# Patient Record
Sex: Male | Born: 1940
Health system: Southern US, Community
[De-identification: ages and names within clinical notes are randomized; demographics above are authoritative.]

## PROBLEM LIST (undated history)

## (undated) DIAGNOSIS — I10 Essential (primary) hypertension: Secondary | ICD-10-CM

## (undated) DIAGNOSIS — E7800 Pure hypercholesterolemia, unspecified: Secondary | ICD-10-CM

## (undated) DIAGNOSIS — I2510 Atherosclerotic heart disease of native coronary artery without angina pectoris: Secondary | ICD-10-CM

## (undated) DIAGNOSIS — M7020 Olecranon bursitis, unspecified elbow: Secondary | ICD-10-CM

## (undated) DIAGNOSIS — I219 Acute myocardial infarction, unspecified: Secondary | ICD-10-CM

## (undated) DIAGNOSIS — Z8774 Personal history of (corrected) congenital malformations of heart and circulatory system: Secondary | ICD-10-CM

## (undated) HISTORY — DX: Pure hypercholesterolemia, unspecified: E78.00

## (undated) HISTORY — DX: Atherosclerotic heart disease of native coronary artery without angina pectoris: I25.10

## (undated) HISTORY — DX: Acute myocardial infarction, unspecified: I21.9

## (undated) HISTORY — DX: Olecranon bursitis, unspecified elbow: M70.20

---

## 1998-11-16 ENCOUNTER — Inpatient Hospital Stay (HOSPITAL_COMMUNITY): Admission: EM | Admit: 1998-11-16 | Discharge: 1998-11-20 | Payer: Self-pay | Admitting: Cardiovascular Disease

## 1999-08-15 ENCOUNTER — Inpatient Hospital Stay (HOSPITAL_COMMUNITY): Admission: EM | Admit: 1999-08-15 | Discharge: 1999-08-18 | Payer: Self-pay | Admitting: Cardiology

## 1999-08-15 HISTORY — PX: CORONARY ANGIOPLASTY WITH STENT PLACEMENT: SHX49

## 1999-12-15 ENCOUNTER — Ambulatory Visit (HOSPITAL_COMMUNITY): Admission: RE | Admit: 1999-12-15 | Discharge: 1999-12-15 | Payer: Self-pay | Admitting: Cardiovascular Disease

## 1999-12-15 HISTORY — PX: CARDIAC CATHETERIZATION: SHX172

## 2001-02-05 ENCOUNTER — Other Ambulatory Visit: Admission: RE | Admit: 2001-02-05 | Discharge: 2001-02-05 | Payer: Self-pay | Admitting: General Surgery

## 2005-01-13 ENCOUNTER — Ambulatory Visit: Payer: Self-pay

## 2005-01-13 ENCOUNTER — Ambulatory Visit: Payer: Self-pay | Admitting: Cardiovascular Disease

## 2006-03-07 ENCOUNTER — Ambulatory Visit: Payer: Self-pay | Admitting: Cardiovascular Disease

## 2006-10-10 ENCOUNTER — Ambulatory Visit: Payer: Self-pay | Admitting: Cardiovascular Disease

## 2006-11-22 ENCOUNTER — Other Ambulatory Visit: Admission: RE | Admit: 2006-11-22 | Discharge: 2006-11-22 | Payer: Self-pay | Admitting: General Surgery

## 2007-11-06 ENCOUNTER — Ambulatory Visit: Payer: Self-pay | Admitting: Cardiovascular Disease

## 2009-02-02 ENCOUNTER — Telehealth: Payer: Self-pay | Admitting: Cardiovascular Disease

## 2009-11-04 DIAGNOSIS — E7800 Pure hypercholesterolemia, unspecified: Secondary | ICD-10-CM | POA: Insufficient documentation

## 2009-11-04 DIAGNOSIS — I2510 Atherosclerotic heart disease of native coronary artery without angina pectoris: Secondary | ICD-10-CM | POA: Insufficient documentation

## 2009-11-08 ENCOUNTER — Ambulatory Visit: Payer: Self-pay | Admitting: Cardiovascular Disease

## 2009-11-10 ENCOUNTER — Ambulatory Visit: Payer: Self-pay | Admitting: Cardiovascular Disease

## 2009-11-11 ENCOUNTER — Encounter (INDEPENDENT_AMBULATORY_CARE_PROVIDER_SITE_OTHER): Payer: Self-pay | Admitting: *Deleted

## 2009-11-11 LAB — CONVERTED CEMR LAB
Cholesterol: 134 mg/dL (ref 0–200)
HDL: 34.2 mg/dL — ABNORMAL LOW (ref >39.00)
LDL Cholesterol: 75 mg/dL (ref 0–99)
Total CHOL/HDL Ratio: 4
Triglycerides: 125 mg/dL (ref 0.0–149.0)
VLDL: 25 mg/dL (ref 0.0–40.0)

## 2010-02-24 ENCOUNTER — Encounter (INDEPENDENT_AMBULATORY_CARE_PROVIDER_SITE_OTHER): Payer: Self-pay | Admitting: *Deleted

## 2010-06-07 ENCOUNTER — Ambulatory Visit: Payer: Self-pay | Admitting: Cardiovascular Disease

## 2010-11-08 NOTE — Assessment & Plan Note (Signed)
Summary: F1Y  Medications Added METOPROLOL TARTRATE 50 MG TABS (METOPROLOL TARTRATE) Take one tablet by mouth twice a day ASPIRIN EC 325 MG TBEC (ASPIRIN) Take one tablet by mouth daily      Allergies Added: NKDA  CC:  yealry visit.  History of Present Illness: Larry [name] is seen today for F/U of old IMI with CAD and elevated lipids.  He had multiple interventions to the RCA in 2000 and 2001.  His last myovue was in 2006. He is very active and feels that so long as he doesn't have angina he doesn't need a stress test.  Since he had classic angina with his MI I told him I agree.  He has nitro renewed every year and takes ASA.  He needs LFT's and lipids this week.  He has recently moved closer to [place].  He is seeing Delphina Cahill as his primary now.  Current Problems (verified): 1)  Hypercholesterolemia  (ICD-272.0) 2)  Cad  (ICD-414.00)  Current Medications (verified): 1)  Pravastatin Sodium 40 Mg Tabs (Pravastatin Sodium) .... Take One Tablet By Mouth Daily At Bedtime 2)  Metoprolol Tartrate 50 Mg Tabs (Metoprolol Tartrate) .... Take One Tablet By Mouth Twice A Day 3)  Aspirin Ec 325 Mg Tbec (Aspirin) .... Take One Tablet By Mouth Daily  Allergies (verified): No Known Drug Allergies  Past History:  Past Medical History: Last updated: 11/04/2009 CAD:  IMI in 2000 with stent to RCA in 11/1998 and 08/1999.  Stents patent on cath 2001.  Inferior infarct on myovue in 2006 with no ishcemia and normal EF Hypercholesterolemia: Olecranon bursitis  Family History: Last updated: 11/04/2009 noncontributory  Social History: Last updated: 11/08/2009 Married Non-smoker Non-drinker Active does a lot of projects around the house.  Social History: Married Non-smoker Non-drinker Active does a lot of projects around the house.  Review of Systems       Denies fever, malais, weight loss, blurry vision, decreased visual acuity, cough, sputum, SOB, hemoptysis, pleuritic pain, palpitaitons,  heartburn, abdominal pain, melena, lower extremity edema, claudication, or rash. Did have one episode of positional vertigo while grocery shopping and spinning quickly All other systems reviewed and negative  Vital Signs:  Patient profile:   70 year old male Height:      72 inches Weight:      202 pounds BMI:     27.50 Pulse rate:   64 / minute Resp:     12 per minute BP sitting:   142 / 70  (left arm)  Vitals Entered By: Burnett Kanaris (November 08, 2009 2:17 PM)  Physical Exam  General:  Affect appropriate Healthy:  appears stated age 21: normal Neck supple with no adenopathy JVP normal no bruits no thyromegaly Lungs clear with no wheezing and good diaphragmatic motion Heart:  S1/S2 no murmur,rub, gallop or click PMI normal Abdomen: benighn, BS positve, no tenderness, no AAA no bruit.  No HSM or HJR Distal pulses intact with no bruits No edema Neuro non-focal Skin warm and dry    Impression & Recommendations:  Problem # 1:  HYPERCHOLESTEROLEMIA (ICD-272.0) labs this week.  Change to Crestor or Vytorin if LDL greater than 100 His updated medication list for this problem includes:    Pravastatin Sodium 40 Mg Tabs (Pravastatin sodium) .Marland Kitchen... Take one tablet by mouth daily at bedtime  Problem # 2:  CAD (ICD-414.00) Stable no angina.  Continue ASA and RF modificaiton His updated medication list for this problem includes:    Metoprolol Tartrate 50 Mg  Tabs (Metoprolol tartrate) .Marland Kitchen... Take one tablet by mouth twice a day    Aspirin Ec 325 Mg Tbec (Aspirin) .Marland Kitchen... Take one tablet by mouth daily  Patient Instructions: 1)  Your physician recommends that you schedule a follow-up appointment in: 6 MONTHS 2)  Your physician recommends that you return for a FASTING lipid profile: THIS WEEK   EKG Report  Procedure date:  11/08/2009  Findings:      NSR 65 LAE Old IMI Poor R wave progression.

## 2010-11-08 NOTE — Assessment & Plan Note (Signed)
Summary: f56m/per pt call/lg  Medications Added NITROSTAT 0.4 MG SUBL (NITROGLYCERIN) 1 tablet under tongue at onset of chest pain; you may repeat every 5 minutes for up to 3 doses.      Allergies Added: NKDA  CC:  check up.  History of Present Illness: Larry [name] is seen today for F/U of old IMI with CAD and elevated lipids.  He had multiple interventions to the RCA in 2000 and 2001.  His last myovue was in 2006. He is very active and feels that so long as he doesn't have angina he doesn't need a stress test.  Since he had classic angina with his MI I told him I agree. He needs a refill on nitro.  Last LDL in 2/11 was 75.  He has some stiffness in his left knee.  We gave him Dr Angelica Ran and Honor Loh name for referral  He would need a myovue to clear him for surgery. Notes easy bruising on adult ASA    Current Problems (verified): 1)  Hypercholesterolemia  (ICD-272.0) 2)  Cad  (ICD-414.00)  Current Medications (verified): 1)  Pravastatin Sodium 40 Mg Tabs (Pravastatin Sodium) .... Take One Tablet By Mouth Daily At Bedtime 2)  Metoprolol Tartrate 50 Mg Tabs (Metoprolol Tartrate) .... Take One Tablet By Mouth Twice A Day 3)  Aspirin Ec 325 Mg Tbec (Aspirin) .... Take One Tablet By Mouth Daily 4)  Nitrostat 0.4 Mg Subl (Nitroglycerin) .Marland Kitchen.. 1 Tablet Under Tongue At Onset of Chest Pain; You May Repeat Every 5 Minutes For Up To 3 Doses.  Allergies (verified): No Known Drug Allergies  Past History:  Past Medical History: Last updated: 11/04/2009 CAD:  IMI in 2000 with stent to RCA in 11/1998 and 08/1999.  Stents patent on cath 2001.  Inferior infarct on myovue in 2006 with no ishcemia and normal EF Hypercholesterolemia: Olecranon bursitis  Family History: Last updated: 11/04/2009 noncontributory  Social History: Last updated: 11/08/2009 Married Non-smoker Non-drinker Active does a lot of projects around the house.  Review of Systems       Denies fever, malais, weight loss, blurry  vision, decreased visual acuity, cough, sputum, SOB, hemoptysis, pleuritic pain, palpitaitons, heartburn, abdominal pain, melena, lower extremity edema, claudication, or rash.   Vital Signs:  Patient profile:   70 year old male Height:      72 inches Weight:      206 pounds BMI:     28.04 Pulse rate:   64 / minute Resp:     12 per minute BP sitting:   137 / 72  (left arm)  Vitals Entered By: Burnett Kanaris (June 07, 2010 2:51 PM)  Physical Exam  General:  Affect appropriate Healthy:  appears stated age 75: normal Neck supple with no adenopathy JVP normal no bruits no thyromegaly Lungs clear with no wheezing and good diaphragmatic motion Heart:  S1/S2 no murmur,rub, gallop or click PMI normal Abdomen: benighn, BS positve, no tenderness, no AAA no bruit.  No HSM or HJR Distal pulses intact with no bruits No edema Neuro non-focal Skin warm and dry    Impression & Recommendations:  Problem # 1:  HYPERCHOLESTEROLEMIA (ICD-272.0) At goal continue statin.  F/U labs Delphina Cahill His updated medication list for this problem includes:    Pravastatin Sodium 40 Mg Tabs (Pravastatin sodium) .Marland Kitchen... Take one tablet by mouth daily at bedtime  Problem # 2:  CAD (ICD-414.00) Distant stents to RCA.  No angina.  Can take 81mg  ASA.  Refill nitro called in .  Myovue if needs to be cleared for knee surgery His updated medication list for this problem includes:    Metoprolol Tartrate 50 Mg Tabs (Metoprolol tartrate) .Marland Kitchen... Take one tablet by mouth twice a day    Aspirin Ec 325 Mg Tbec (Aspirin) .Marland Kitchen... Take one tablet by mouth daily    Nitrostat 0.4 Mg Subl (Nitroglycerin) .Marland Kitchen... 1 tablet under tongue at onset of chest pain; you may repeat every 5 minutes for up to 3 doses.  Patient Instructions: 1)  Your physician recommends that you schedule a follow-up appointment in: ONE YEAR 2)  DR Stephanie Coup 3)  DR Adriana Mccallum Prescriptions: NITROSTAT 0.4 MG SUBL (NITROGLYCERIN) 1 tablet under  tongue at onset of chest pain; you may repeat every 5 minutes for up to 3 doses.  #25 x 12   Entered by:   Fredia Beets, RN   Authorized by:   Bosie Clos, MD, [facility]   Signed by:   Fredia Beets, RN on 06/07/2010   Method used:   Electronically to        [facility] (retail)       [address]       [place], [place]  [address]       Ph: [phone] or [phone]       Fax: [phone]   RxID:   [id number]

## 2010-11-08 NOTE — Letter (Signed)
Summary: Appointment - Reminder [place], [place]  [address] [place]   [place], [place] [address]   Phone: [phone]  Fax: [phone]     Feb 24, 2010 MRN: [id number]   Larry [name] [address] [place], [place]  [address]   Dear [name],  Our records indicate that it is time to schedule a follow-up appointment with Dr. Johnsie Cancel. It is very important that we reach you to schedule this appointment. We look forward to participating in your health care needs. Please contact us at the number listed above at your earliest convenience to schedule your appointment.  If you are unable to make an appointment at this time, give [place] a call so we can update our records.     Sincerely,   Darnell Level [facility] Scheduling Team

## 2010-11-08 NOTE — Letter (Signed)
Summary: [place], [place] [address] [place]   [place], [place] [address]   Phone: [phone]  Fax: [phone]     November 11, 2009 MRN: [id number]   Larry [name] [address] [place], [place]  [address]   Dear [name],  We have reviewed your cholesterol results.  They are as follows:     Total Cholesterol:    134 (Desirable: less than 200)       HDL  Cholesterol:     34.20  (Desirable: greater than 40 for men and 50 for women)       LDL Cholesterol:       75  (Desirable: less than 100 for low risk and less than 70 for moderate to high risk)       Triglycerides:       125.0  (Desirable: less than 150)  Our recommendations include: These numbers look great! Continue on the same medicine. Take care, Dr Margot Ables   Call our office at the number listed above if you have any questions.  Lowering your LDL cholesterol is important, but it is only one of a large number of "risk factors" that may indicate that you are at risk for heart disease, stroke or other complications of hardening of the arteries.  Other risk factors include:   A.  Cigarette Smoking* B.  High Blood Pressure* C.  Obesity* D.   Low HDL Cholesterol (see yours above)* E.   Diabetes Mellitus (higher risk if your is uncontrolled) F.  Family history of premature heart disease G.  Previous history of stroke or cardiovascular disease    *These are risk factors YOU HAVE CONTROL OVER.  For more information, visit .  There is now evidence that lowering the TOTAL CHOLESTEROL AND LDL CHOLESTEROL can reduce the risk of heart disease.  The American Heart Association recommends the following guidelines for the treatment of elevated cholesterol:  1.  If there is now current heart disease and less than two risk factors, TOTAL CHOLESTEROL should be less than 200 and LDL CHOLESTEROL should be less than 100. 2.  If there is current heart disease or two or more risk factors, TOTAL  CHOLESTEROL should be less than 200 and LDL CHOLESTEROL should be less than 70.  A diet low in cholesterol, saturated fat, and calories is the cornerstone of treatment for elevated cholesterol.  Cessation of smoking and exercise are also important in the management of elevated cholesterol and preventing vascular disease.  Studies have shown that 30 to 60 minutes of physical activity most days can help lower blood pressure, lower cholesterol, and keep your weight at a healthy level.  Drug therapy is used when cholesterol levels do not respond to therapeutic lifestyle changes (smoking cessation, diet, and exercise) and remains unacceptably high.  If medication is started, it is important to have you levels checked periodically to evaluate the need for further treatment options.  Thank you,    [facility] Team

## 2011-02-21 NOTE — Assessment & Plan Note (Signed)
[facility] OFFICE NOTE   LAN, HODGSON                        MRN:          [id number]  DATE:11/06/2007                            DOB:          1940/12/10    Larry [name] returns in followup.  He has known coronary artery disease  with a previous inferior wall MI.  As I recall he initially had an MI  back in around 2000 with TPA.  Subsequently he had 2 stents to the right  and had reocclusion with repeat intervention.   He has been quite stable over the last few years.   His last Myoview is in April 2006, was low risk with an inferior basal  wall scar and an EF of 54%.  He is not having angina.  He is active.  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  He hopes to retire and start drawing Medicare in March.   He is also very active with his granddaughter up in [place] area.  He  saw Dr. Caron [name]  recently and had lipids which were acceptable, with an  LDL around 80.   He needs a refill on his nitroglycerin. He has been compliant with his  other medications.  He is not having chest pain, PND, orthopnea.   REVIEW OF SYSTEMS:  Otherwise remarkable for recent injury driving a  staple through the index finger on his right hand.  He did get a tetanus  shot for this.  He also has some fluid in the left olecranon bursa.   His medications include aspirin, Toprol 50 a day, Pravachol 40 a day.  He uses [facility]   His exam is remarkable for healthy-appearing elderly white male in no  distress.  Weight is 198, blood pressure is 130/80, pulse 72 and  regular, afebrile.  HEENT:  Unremarkable.  NECK:  Carotids are without bruit, no lymphadenopathy, no thyromegaly or  JVP elevation.  LUNGS:  Clear, good  diaphragmatic motion.  No wheezing.  HEART:  S1-S2 with normal heart sounds.  PMI normal.  ABDOMEN:  Benign.  Bowel sounds positive, no AAA, no tenderness,  no  hepatosplenomegaly.  EXTREMITIES:  Good reflexes, pulse are intact, no edema.  He has a bit  of fluid in the olecranon bursa over the left elbow.  It is not painful.  There is no evidence of gouty  infiltration.   His EKG shows sinus rhythm with an inferior wall infarct. It was misread  by the computer as a flutter due to artifact.   IMPRESSION:  1. Coronary disease, previous IMI.  Continue aspirin and beta blocker,      follow-up Myoview in a year.  Nitroglycerin called in via [facility].  2. Hypercholesterolemia.  Continue Pravachol 40 mg a day, lipid and      liver profile acceptable at Dr. Hulen [name]  office.  Follow-up in a      year.  3. Olecranon bursitis.  Consider p.r.n. nonsteroidals.  I would not  drain it at this time.  I suspect the fluid will subside with in a      week or two.  Overall Larry [name] was doing well and I will see him back      in the year.     Larry [name]. Larry Cancel, MD, [facility]  Electronically Signed    PCN/MedQ  DD: 11/06/2007  DT: 11/06/2007  Job #: [id number]

## 2011-02-24 NOTE — Cardiovascular Report (Signed)
[place]  Patient:    Larry [name], Larry [name]                      MRN: [id number] Proc. Date: 12/15/99 Adm. Date:  [id number] Disc. Date: [id number] Attending:  Fatima Sanger CC:         Dr. Debara Pickett. Wall, M.D. Leon. Johnsie Cancel, M.D. [facility]             Bruce R. Olevia Perches, M.D. [facility]             Cardiopulmonary Lab                        Cardiac Catheterization  CLINICAL HISTORY:  [name] is 70 years old and had a previous stent placed in the right coronary artery by Dr. Lia Foyer in February 2000, and in November 2000 he presented with an acute diaphragmatic wall infarction.  At that time he underwent PTCA and placement of a second overlying stent in the distal right coronary artery. He had done well since that time with no recurrent symptoms, but he had a recent Cardiolite scan which suggested inferior ischemia.  He was seen by Dr. Johnsie Cancel and scheduled for evaluation with catheterization.  PROCEDURAL NOTE:  The procedure was performed by the right femoral artery using  arterial sheath and 6-French preformed coronary catheters.  A front wall arterial puncture was performed and Omnipaque contrast was used.  At the completion of the procedure the right femoral artery was closed with Perclose.  The patient tolerated the procedure well and left the laboratory in satisfactory condition.  HEMODYNAMIC RESULTS: 1. Aortic pressure:  143/89 with mean of 109. 2. Left ventricular pressure:   143/25.  ANGIOGRAPHIC RESULTS: 1. LEFT MAIN CORONARY ARTERY:  Free of significant disease. 2. LEFT ANTERIOR DESCENDING CORONARY:   Gave rise to a diagonal branch, a large    septal perforator, a small diagonal branch, and a small septal perforator.    There was some irregularity in the LAD, but no significant obstruction. 3. CIRCUMFLEX CORONARY ARTERY:  Gave rise to a large marginal branch and a    posterolateral branch.   There was 30% narrowing in the marginal branch, and    40% narrowing in the proximal circumflex artery. 4. RIGHT CORONARY ARTERY:  A moderately large vessel.  It gave rise to two right    ventricular branches, a posterior descending branch and three posterolateral  branches.  The site of the two tandem overlying stents was widely patent, with    only 20% focal narrowing at the point where the stents overlapped.  There was    no significant distal disease.  LEFT VENTRICULOGRAPHY:  Performed in the RAO projection showed overall good wall motion.  There was mild hypokinesis of the inferobasilar segment.  The estimated ejection fraction was 55%.  CONCLUSIONS:  Coronary artery disease, status post prior stenting of the right coronary artery February 2000; and status post prior DMI treated with second overlying stents of the distal right coronary artery November 2000.  No evidence at that time of restenosis (20%) at the stent site, 40% narrowing in the proximal circumflex artery with 30% narrowing in the marginal branch, and mild irregularities in the LAD with mild inferobasilar wall hypokinesis.  RECOMMENDATIONS:  The  patient has not developed restenosis at the stent site. e left ventricular function is good.  Will plan reassurance and continued secondary prevention. DD:  12/15/99 TD:  12/16/99 Job: [id number]

## 2011-02-24 NOTE — Cardiovascular Report (Signed)
. [facility]  Patient:    Larry [name]                       MRN: [id number] Proc. Date: 08/15/99 Adm. Date:  [id number] Attending:  Fatima Sanger CC:         Reece Packer, M.D., [place], [place] Wall, M.D. [place]. Johnsie Cancel, M.D. [facility]                        Cardiac Catheterization  PROCEDURES PERFORMED:  Cardiac Catheterization, IVUS, and stent implantation.  CLINICAL HISTORY:  Larry [name] is 70 years old and had a stent placed by Dr. Lia Foyer in February of this year in the mid to distal right coronary artery.  He had the onset of chest pain at 7:30 this morning and presented to [facility] where his KG showed an acute diaphragmatic wall infarction.  He was seen by Dr. Verl Blalock and transferred here via [facility] for intervention.  DESCRIPTION OF PROCEDURE:  The patient was in atrial fibrillation on arrival with a ventricular of about 120.  The patient was performed via the right femoral artery using arterial sheath and 6-French diagnostic catheters.  After it was recognized that there was total occlusion of the distal right coronary artery at the stent site, preparations were made for intervention.  The patient had been given 5000 units of heparin, which  prolonged the ACT to greater than 200 seconds.  He was started on an Aggrastat rip after a bolus.  We used a JR-4 7-French guiding catheter with sideholes and a short floppy wire.  We crossed the total obstruction within the stent with the wire without difficulty.  We went in with a 3.0 CrossSail balloon and performed two inflations of 8 atm x 30 seconds.  This resulted in reperfusion.  There were filling defects distal to the stent and we elected to perform IVUS.  This documented that the distal reference vessel was about 4.5, but the lumen was about 3.0 and there was severe narrowing of the lumen just distal to the stent. Also, the stent  appeared to be slightly underexpanded in its mid portion with dimensions of 2.7.  It had been treated with a 3.75 balloon at the time of the previous intervention.  For these reasons, we made the decision to go in with a 3.5 x 20 mm Quantum Ranger and we performed two to three inflations up to 12 atm to 14 atm or 30 seconds.  We then deployed a 3.0 x 16 mm NIR On Sox, which ended just before the posterior descending branch and barely overlapped the first stent.  We deployed  this with two inflations of 12 atm and 14 atm x 30 seconds.  We then went back n with the 3.5 Quantum Ranger and performed two inflations up to 15 atm x 30 seconds. We then went in with a 13 mm x 3.75 Maxxum and performed one inflation up to 12 atm for 30 seconds.  The proximal reference lumen was 4.0 and just outside the stent the dimensions were approximately 3.25.  This was prior to our intervention with the 3.75 balloon.  Repeat diagnostic studies were then performed through the guiding catheter.  The patient tolerated the procedure well and left the laboratory  in satisfactory condition.  His rhythm converted to sinus rhythm near the end of the procedure.  RESULTS: 1. The left main coronary artery was free of significant disease.  2. The left anterior descending artery gave rise to a diagonal branch and a septal    perforator.  There was 70% narrowing in the proximal portion of the diagonal    branch.  The rest of the vessel appeared to be free of major obstruction.  3. The circumflex artery gave rise to a large marginal branch and a posterolateral    branch.  The circumflex vessel was irregular, but there was no major    obstruction.  4. The right coronary artery is a dominant vessel which gave rise to two right    ventricular branches and then was completely occluded within the stent. There    was TIMI-1 flow distally.  LEFT VENTRICULOGRAM:  The left ventriculogram was performed in the  RAO projection and showed akinesis of the inferobasal and inferior wall.  The apex and the anterolateral wall move well.  The estimated ejection fraction was 50%.  DISCUSSION:  Following PTCA and placement of a second overlying stent in the distal right coronary artery, the stenosis improved from 100% to less than 10% and the  flow improved from TIMI-0 to TIMI-3 flow.  There was a residual filling defect t the distal edge of the second stent which was felt to represent thrombus.  The IVUS dimensions are recorded on a separate sheet, which I do not have in front of me at the present time, but the distal reference lumen was 3.0 and the proximal reference lumen was 4.0.  There was a severe amount of plaque just distal to the original stent and there also was some material within the lumen which may have  been thrombus or it could have been a distal dissection, although I suspect the  former.  This was the appearance before we placed the second stent.  CONCLUSIONS: 1. Coronary artery disease, status post prior stenting of the mid to  distal right    coronary artery in February 2000, with an acute diaphragmatic wall infarction    due to total occlusion at the stent site.  There is also 70% narrowing in the    diagonal branch of the left anterior descending artery.  The left ventriculogram    showed inferior wall hypo- akinesis. 2. Successful reperfusion, percutaneous transluminal coronary angiography, and    placement of a second overlying stent in the distal right coronary artery with    improvement in the percent of diameter narrowing from 100% to less than 10% nd    improvement in the flow from TIMI-0 to TIMI-3 flow.  DISPOSITION:  The patient was returned to the post anesthesia care unit for further observation.  I do not have the door time to balloon time available to me at the present time.   DD:  08/15/99 TD:  08/16/99 Job: [id number]

## 2011-02-24 NOTE — Assessment & Plan Note (Signed)
[facility] OFFICE NOTE   TEMIDAYO, LINA                        MRN:          [id number]  DATE:10/10/2006                            DOB:          [birth year]    Larry [name] returns today for followup.  He has had multiple interventions  and stenting to the mid and distal right coronary artery.  Risk factor  modification has been good.  He is on aspirin and Toprol.  His blood  pressure has been under good control.  He needs followup LFTs and a  cholesterol level on Pravachol.  He will do this up in [place] at Dr.  Hulen Luster office, and forward it to me.   He has not had any significant chest pain, PND, orthopnea, palpitations.   MEDICATIONS:  1. Aspirin a day.  2. Toprol 50 a day.  3. Pravachol 40 a day.   He is under a lot of stress lately.  He has had a change in his work  schedule and has to go to [place] quite a bit, and he is up in the  middle of the night quite a bit.  He hopes to only work another 15  months before retiring.   EXAM:  He looks well.  Blood pressure 130/70, pulse 62 and regular.  HEENT:  Normal.  Carotids are normal.  There is no bruit.  There is no lymphadenopathy.  LUNGS:  Clear.  There is an S1, S2 with normal heart sounds.  ABDOMEN:  Benign.  LOWER EXTREMITIES:  Intact pulses.  No edema.   IMPRESSION:  Stable single vessel coronary artery disease with multiple  stents to the right coronary artery.  I will have to check my records,  but I am actually surprised that he is not on Plavix.   The patient has had restenosis of his stents and I would imagine that at  least 1 of his stents is drug-eluting.  I will review these records and  advise Mr. Millerick about his Plavix.   He will continue his aspirin and Toprol.  We will make recommendations  about his Pravachol dose once we get his recent LDL and LFTs checked.   Overall, I am glad that he is asymptomatic.  He will try to deal  with  the stress at work for another 15 months before he retires.  I will see  him back in 6 months up in [place].     Wallis Bamberg. Johnsie Cancel, MD, [facility]  Electronically Signed    PCN/MedQ  DD: 10/10/2006  DT: 10/10/2006  Job #: [id number]

## 2011-03-07 ENCOUNTER — Telehealth: Payer: Self-pay | Admitting: Cardiovascular Disease

## 2011-03-07 MED ORDER — PRAVASTATIN SODIUM 40 MG PO TABS: 40.0000 mg | ORAL_TABLET | Freq: Every day | ORAL | Status: DC

## 2011-03-07 NOTE — Telephone Encounter (Signed)
provastatin 40 ,mg [employer]

## 2011-11-02 ENCOUNTER — Encounter: Payer: Self-pay | Admitting: Cardiovascular Disease

## 2011-11-02 ENCOUNTER — Encounter: Payer: Self-pay | Admitting: *Deleted

## 2011-11-02 ENCOUNTER — Ambulatory Visit (INDEPENDENT_AMBULATORY_CARE_PROVIDER_SITE_OTHER): Payer: Medicare Other | Admitting: Cardiovascular Disease

## 2011-11-02 VITALS — BP 134/80 | HR 61 | Ht 72.0 in | Wt 207.0 lb

## 2011-11-02 DIAGNOSIS — I2510 Atherosclerotic heart disease of native coronary artery without angina pectoris: Secondary | ICD-10-CM

## 2011-11-02 DIAGNOSIS — E7800 Pure hypercholesterolemia, unspecified: Secondary | ICD-10-CM

## 2011-11-02 DIAGNOSIS — E785 Hyperlipidemia, unspecified: Secondary | ICD-10-CM

## 2011-11-02 MED ORDER — NITROGLYCERIN 0.4 MG SL SUBL: 0.4000 mg | SUBLINGUAL_TABLET | SUBLINGUAL | Status: DC | PRN

## 2011-11-02 NOTE — Patient Instructions (Signed)
  Your physician recommends that you return for lab work next week.  Come fasting.  Lipids, Liver, BMET.  Your physician wants you to follow-up in: 1 year.  You will receive a reminder letter in the mail two months in advance. If you don't receive a letter, please call our office to schedule the follow-up appointment.

## 2011-11-02 NOTE — Assessment & Plan Note (Signed)
Stable with no angina and good activity level.  Continue medical Rx Refill for nitro called in

## 2011-11-02 NOTE — Progress Notes (Signed)
Larry [name] is seen today for F/U of old IMI with CAD and elevated lipids. He had multiple interventions to the RCA in 2000 and 2001. His last myovue was in 2006. He is very active and feels that so long as he doesn't have angina he doesn't need a stress test. Since he had classic angina with his MI I told him I agree. He needs a refill on nitro. Last LDL in 2/11 was 75. He has some stiffness in his left knee. We gave him Dr Larry [name] and Larry [name] name for referral He would need a myovue to clear him for surgery. Notes easy bruising on adult ASA  Works part time at [employer].  Needs nitro refilled and labs for cholesterol and LFT;s  ROS: Denies fever, malais, weight loss, blurry vision, decreased visual acuity, cough, sputum, SOB, hemoptysis, pleuritic pain, palpitaitons, heartburn, abdominal pain, melena, lower extremity edema, claudication, or rash.  All other systems reviewed and negative  General: Affect appropriate Healthy:  appears stated age 71: normal Neck supple with no adenopathy JVP normal no bruits no thyromegaly Lungs clear with no wheezing and good diaphragmatic motion Heart:  S1/S2 no murmur, no rub, gallop or click PMI normal Abdomen: benighn, BS positve, no tenderness, no AAA no bruit.  No HSM or HJR Distal pulses intact with no bruits No edema Neuro non-focal Skin warm and dry No muscular weakness   Current Outpatient Prescriptions  Medication Sig Dispense Refill  . metoprolol (LOPRESSOR) 50 MG tablet Take 50 mg by mouth 2 (two) times daily.      . nitroGLYCERIN (NITROSTAT) 0.4 MG SL tablet Place 0.4 mg under the tongue every 5 (five) minutes as needed.      . pravastatin (PRAVACHOL) 40 MG tablet Take 1 tablet (40 mg total) by mouth daily.  30 tablet  11    Allergies  Plavix  Electrocardiogram:  NSR rate 61 poor Rwave progression Q 3,f  Assessment and Plan

## 2011-11-02 NOTE — Assessment & Plan Note (Signed)
Cholesterol is at goal.  Continue current dose of statin and diet Rx.  No myalgias or side effects.  F/U  LFT's in 6 months. Lab Results  Component Value Date   LDLCALC 75 11/10/2009   F/U labs this week

## 2011-11-07 ENCOUNTER — Other Ambulatory Visit (INDEPENDENT_AMBULATORY_CARE_PROVIDER_SITE_OTHER): Payer: Medicare Other | Admitting: *Deleted

## 2011-11-07 DIAGNOSIS — E785 Hyperlipidemia, unspecified: Secondary | ICD-10-CM

## 2011-11-07 LAB — LIPID PANEL
Cholesterol: 162 mg/dL (ref 0–200)
HDL: 35.8 mg/dL — ABNORMAL LOW (ref >39.00)
VLDL: 25.2 mg/dL (ref 0.0–40.0)

## 2011-11-07 LAB — BASIC METABOLIC PANEL
BUN: 16 mg/dL (ref 6–23)
CO2: 27 mEq/L (ref 19–32)
Calcium: 9 mg/dL (ref 8.4–10.5)
Creatinine, Ser: 1.1 mg/dL (ref 0.4–1.5)
GFR: 73.66 mL/min (ref >60.00)
Glucose, Bld: 112 mg/dL — ABNORMAL HIGH (ref 70–99)

## 2011-11-07 LAB — HEPATIC FUNCTION PANEL
Albumin: 4.1 g/dL (ref 3.5–5.2)
Alkaline Phosphatase: 66 U/L (ref 39–117)
Total Protein: 7 g/dL (ref 6.0–8.3)

## 2011-12-29 ENCOUNTER — Other Ambulatory Visit: Payer: Self-pay | Admitting: Cardiovascular Disease

## 2012-02-26 ENCOUNTER — Other Ambulatory Visit: Payer: Self-pay | Admitting: Cardiovascular Disease

## 2012-11-01 ENCOUNTER — Ambulatory Visit: Payer: Medicare Other | Admitting: Cardiovascular Disease

## 2012-11-11 ENCOUNTER — Encounter: Payer: Self-pay | Admitting: Cardiovascular Disease

## 2012-11-11 ENCOUNTER — Telehealth: Payer: Self-pay | Admitting: Cardiovascular Disease

## 2012-11-11 ENCOUNTER — Ambulatory Visit (INDEPENDENT_AMBULATORY_CARE_PROVIDER_SITE_OTHER): Payer: Medicare Other | Admitting: Cardiovascular Disease

## 2012-11-11 VITALS — BP 130/72 | HR 97 | Ht 71.0 in | Wt 199.1 lb

## 2012-11-11 DIAGNOSIS — E785 Hyperlipidemia, unspecified: Secondary | ICD-10-CM

## 2012-11-11 DIAGNOSIS — I4892 Unspecified atrial flutter: Secondary | ICD-10-CM

## 2012-11-11 DIAGNOSIS — Z79899 Other long term (current) drug therapy: Secondary | ICD-10-CM

## 2012-11-11 LAB — CBC WITH DIFFERENTIAL/PLATELET
Basophils Relative: 0.4 % (ref 0.0–3.0)
Eosinophils Relative: 0.9 % (ref 0.0–5.0)
Hemoglobin: 16.4 g/dL (ref 13.0–17.0)
Lymphocytes Relative: 22.6 % (ref 12.0–46.0)
MCHC: 34.3 g/dL (ref 30.0–36.0)
Monocytes Relative: 9.3 % (ref 3.0–12.0)
Neutro Abs: 6 10*3/uL (ref 1.4–7.7)
RBC: 5.14 Mil/uL (ref 4.22–5.81)
WBC: 9 10*3/uL (ref 4.5–10.5)

## 2012-11-11 LAB — LIPID PANEL
LDL Cholesterol: 67 mg/dL (ref 0–99)
Total CHOL/HDL Ratio: 4
VLDL: 25.4 mg/dL (ref 0.0–40.0)

## 2012-11-11 LAB — HEPATIC FUNCTION PANEL
Albumin: 4.1 g/dL (ref 3.5–5.2)
Alkaline Phosphatase: 63 U/L (ref 39–117)
Bilirubin, Direct: 0.2 mg/dL (ref 0.0–0.3)

## 2012-11-11 LAB — BASIC METABOLIC PANEL
BUN: 16 mg/dL (ref 6–23)
CO2: 25 mEq/L (ref 19–32)
Chloride: 108 mEq/L (ref 96–112)
Creatinine, Ser: 1 mg/dL (ref 0.4–1.5)

## 2012-11-11 MED ORDER — METOPROLOL TARTRATE 50 MG PO TABS: ORAL_TABLET | ORAL | Status: DC

## 2012-11-11 MED ORDER — RIVAROXABAN 20 MG PO TABS: 20.0000 mg | ORAL_TABLET | Freq: Every day | ORAL | Status: DC

## 2012-11-11 NOTE — Progress Notes (Signed)
Patient ID: Larry [name], male   DOB: [birth year], 72 y.o.   MRN: [id number] Magnus is seen today for F/U of old IMI with CAD and elevated lipids. He had multiple interventions to the RCA in 2000 and 2001. His last myovue was in 2006. He is very active and feels that so long as he doesn't have angina he doesn't need a stress test. Since he had classic angina with his MI I told him I agree. He needs a refill on nitro. Last LDL in 2/11 was 75. He has some stiffness in his left knee. We gave him Dr Angelica Ran and Honor Loh name for referral He would need a myovue to clear him for surgery. Notes easy bruising on adult ASA Works part time at [employer]. Needs nitro refilled and labs for cholesterol and LFT;s  In office today in asymptomatic flutter rate 103  Discussed need for [facility] and anticoagulation  ROS: Denies fever, malais, weight loss, blurry vision, decreased visual acuity, cough, sputum, SOB, hemoptysis, pleuritic pain, palpitaitons, heartburn, abdominal pain, melena, lower extremity edema, claudication, or rash.  All other systems reviewed and negative  General: Affect appropriate Healthy:  appears stated age 72: normal Neck supple with no adenopathy JVP normal no bruits no thyromegaly Lungs clear with no wheezing and good diaphragmatic motion Heart:  S1/S2 no murmur, no rub, gallop or click PMI normal Abdomen: benighn, BS positve, no tenderness, no AAA no bruit.  No HSM or HJR Distal pulses intact with no bruits No edema Neuro non-focal Skin warm and dry No muscular weakness   Current Outpatient Prescriptions  Medication Sig Dispense Refill  . aspirin 81 MG tablet Take 81 mg by mouth daily.      . metoprolol (LOPRESSOR) 50 MG tablet TAKE ONE TABLET BY MOUTH TWICE A DAY  180 tablet  3  . nitroGLYCERIN (NITROSTAT) 0.4 MG SL tablet Place 1 tablet (0.4 mg total) under the tongue every 5 (five) minutes as needed.  25 tablet  5  . pravastatin (PRAVACHOL) 40 MG tablet TAKE ONE (1)  TABLET EACH DAY  30 tablet  8    Allergies  Plavix  Electrocardiogram: Atrial flutter rate 102 Old IMI   Assessment and Plan

## 2012-11-11 NOTE — Patient Instructions (Signed)
Your physician recommends that you schedule a follow-up appointment in:  [address] ECHO [place] Your physician has recommended you make the following change in your medication:  INCREASE LOPRESSOR TO 65 MG TWICE DAILY AND START XARELTO  20 MG WITH EVENING MEAL  Your physician recommends that you return for lab work in:   [place]  BMET  DX V58.69  Your physician has requested that you have an echocardiogram. Echocardiography is a painless test that uses sound waves to create images of your heart. It provides your doctor with information about the size and shape of your heart and how well your heart's chambers and valves are working. This procedure takes approximately one hour. There are no restrictions for this procedure. DX A FLUTTER

## 2012-11-11 NOTE — Assessment & Plan Note (Signed)
Stable with no angina and good activity level.  Continue medical Rx  

## 2012-11-11 NOTE — Assessment & Plan Note (Signed)
Disscussed need for better rate control and anticoagulaiton Increase lopressor to 75 bid and start xarelto Check CBC and lytes.  Echo and f/u with me in 3 weeks to plan [facility]

## 2012-11-11 NOTE — Telephone Encounter (Signed)
New Problem     Requesting clarification. Pharmacy would like to know if Dr. Johnsie Cancel is changing dosage on metoprolol prescription. Would like to speak to nurse.

## 2012-11-11 NOTE — Assessment & Plan Note (Signed)
Cholesterol is at goal.  Continue current dose of statin and diet Rx.  No myalgias or side effects.  F/U  LFT's in 6 months. Lab Results  Component Value Date   LDLCALC 101* 11/07/2011

## 2012-11-11 NOTE — Telephone Encounter (Signed)
PHARMACY  AWARE  LOPRESSOR HAS BEEN INCREASED TO 75 MG TWICE DAILY  ./CY

## 2012-11-14 ENCOUNTER — Telehealth: Payer: Self-pay | Admitting: *Deleted

## 2012-11-14 NOTE — Telephone Encounter (Signed)
Keep watching stool

## 2012-11-14 NOTE — Telephone Encounter (Signed)
WHILE GIVING LAB RESULTS, PT ASKED WHAT SIDE EFFECTS  CAME WITH TAKING XARELTO  INFORMED  T  ANY BLEEDING   OR  EASILY BRUISING  PER PT NOTED SMALL AMT OF BLOOD IN STOOL YESTERDAY,  XARELTO WAS STARTED MON 11/11/12 WILL  FORWARD  TOD DR Johnsie Cancel FOR REVIEW .Adonis Housekeeper

## 2012-11-14 NOTE — Telephone Encounter (Signed)
PT AWARE TO CONT TO MONITOR  AND TO CALL  IF FURTHER EPISODES PT AGREES .Larry [name]

## 2012-12-04 ENCOUNTER — Ambulatory Visit (INDEPENDENT_AMBULATORY_CARE_PROVIDER_SITE_OTHER): Payer: Medicare Other | Admitting: Cardiovascular Disease

## 2012-12-04 ENCOUNTER — Ambulatory Visit (HOSPITAL_COMMUNITY): Payer: Medicare Other | Attending: Cardiovascular Disease | Admitting: Radiology

## 2012-12-04 ENCOUNTER — Encounter: Payer: Self-pay | Admitting: Cardiovascular Disease

## 2012-12-04 VITALS — BP 126/86 | HR 60 | Ht 71.0 in | Wt 199.0 lb

## 2012-12-04 DIAGNOSIS — I2510 Atherosclerotic heart disease of native coronary artery without angina pectoris: Secondary | ICD-10-CM | POA: Insufficient documentation

## 2012-12-04 DIAGNOSIS — I4892 Unspecified atrial flutter: Secondary | ICD-10-CM

## 2012-12-04 DIAGNOSIS — I080 Rheumatic disorders of both mitral and aortic valves: Secondary | ICD-10-CM | POA: Insufficient documentation

## 2012-12-04 DIAGNOSIS — E7800 Pure hypercholesterolemia, unspecified: Secondary | ICD-10-CM

## 2012-12-04 DIAGNOSIS — I079 Rheumatic tricuspid valve disease, unspecified: Secondary | ICD-10-CM | POA: Insufficient documentation

## 2012-12-04 DIAGNOSIS — E785 Hyperlipidemia, unspecified: Secondary | ICD-10-CM | POA: Insufficient documentation

## 2012-12-04 NOTE — Progress Notes (Signed)
Patient ID: Halvor Rilley, male   DOB: [birth year], 72 y.o.   MRN: [id number] Christorpher is seen today for F/U of old IMI with CAD and elevated lipids. He had multiple interventions to the RCA in 2000 and 2001. His last myovue was in 2006. He is very active and feels that so long as he doesn't have angina he doesn't need a stress test. Since he had classic angina with his MI I told him I agree. He needs a refill on nitro. Last LDL in 2/11 was 75. He has some stiffness in his left knee.  Last visit was in asymptomatic flutter. Started on xarelto and increased beta blocker  Echo today with EF 50% Moderate LAE reviewed and read  ECG today shows NSR rate 92 old IMI  ROS: Denies fever, malais, weight loss, blurry vision, decreased visual acuity, cough, sputum, SOB, hemoptysis, pleuritic pain, palpitaitons, heartburn, abdominal pain, melena, lower extremity edema, claudication, or rash.  All other systems reviewed and negative  General: Affect appropriate Healthy:  appears stated age 72: normal Neck supple with no adenopathy JVP normal no bruits no thyromegaly Lungs clear with no wheezing and good diaphragmatic motion Heart:  S1/S2 no murmur, no rub, gallop or click PMI normal Abdomen: benighn, BS positve, no tenderness, no AAA no bruit.  No HSM or HJR Distal pulses intact with no bruits No edema Neuro non-focal Skin warm and dry No muscular weakness   Current Outpatient Prescriptions  Medication Sig Dispense Refill  . aspirin 81 MG tablet Take 81 mg by mouth daily.      . metoprolol (LOPRESSOR) 50 MG tablet 1 1/2 TAB  TWICE  DAILY  180 tablet  3  . nitroGLYCERIN (NITROSTAT) 0.4 MG SL tablet Place 1 tablet (0.4 mg total) under the tongue every 5 (five) minutes as needed.  25 tablet  5  . pravastatin (PRAVACHOL) 40 MG tablet TAKE ONE (1) TABLET EACH DAY  30 tablet  8  . Rivaroxaban (XARELTO) 20 MG TABS Take 1 tablet (20 mg total) by mouth daily.  30 tablet  6   No current  facility-administered medications for this visit.    Allergies  Plavix  Electrocardiogram:  See above  Assessment and Plan

## 2012-12-04 NOTE — Assessment & Plan Note (Signed)
Not clear when he converted Continue beta blocker D/C xarelto in 3 weeks

## 2012-12-04 NOTE — Patient Instructions (Addendum)
Your physician recommends that you schedule a follow-up appointment in:  [place] has recommended you make the following change in your medication: STOP XARELTO  IN 3 WEEKS

## 2012-12-04 NOTE — Progress Notes (Signed)
Echocardiogram performed.  

## 2012-12-04 NOTE — Assessment & Plan Note (Signed)
Stable with no angina and good activity level.  Continue medical Rx Echo today with EF 50% septal and inferobasal hypokinesis

## 2012-12-04 NOTE — Assessment & Plan Note (Signed)
Cholesterol is at goal.  Continue current dose of statin and diet Rx.  No myalgias or side effects.  F/U  LFT's in 6 months. Lab Results  Component Value Date   LDLCALC 67 11/11/2012

## 2013-01-09 ENCOUNTER — Other Ambulatory Visit: Payer: Self-pay

## 2013-01-09 MED ORDER — PRAVASTATIN SODIUM 40 MG PO TABS: ORAL_TABLET | ORAL | Status: DC

## 2013-03-04 ENCOUNTER — Ambulatory Visit: Payer: Medicare Other | Admitting: Cardiovascular Disease

## 2013-03-06 ENCOUNTER — Encounter: Payer: Self-pay | Admitting: Cardiovascular Disease

## 2013-03-06 ENCOUNTER — Ambulatory Visit (INDEPENDENT_AMBULATORY_CARE_PROVIDER_SITE_OTHER): Payer: Medicare Other | Admitting: Cardiovascular Disease

## 2013-03-06 VITALS — BP 131/79 | HR 69 | Ht 72.0 in | Wt 201.0 lb

## 2013-03-06 DIAGNOSIS — I2510 Atherosclerotic heart disease of native coronary artery without angina pectoris: Secondary | ICD-10-CM

## 2013-03-06 DIAGNOSIS — I4892 Unspecified atrial flutter: Secondary | ICD-10-CM

## 2013-03-06 DIAGNOSIS — E7800 Pure hypercholesterolemia, unspecified: Secondary | ICD-10-CM

## 2013-03-06 MED ORDER — NITROGLYCERIN 0.4 MG SL SUBL: 0.4000 mg | SUBLINGUAL_TABLET | SUBLINGUAL | Status: DC | PRN

## 2013-03-06 NOTE — Assessment & Plan Note (Signed)
Self limited and resolved  xarelto stopped

## 2013-03-06 NOTE — Assessment & Plan Note (Addendum)
Hold statin for 8 weeks and see if muscle stiffness improves

## 2013-03-06 NOTE — Assessment & Plan Note (Signed)
Stable with no angina and good activity level.  Continue medical Rx  Wouild need cath or myovue before any knee surgery

## 2013-03-06 NOTE — Progress Notes (Signed)
Patient ID: Larry [name], male   DOB: May 31, 1941, 72 y.o.   MRN: [id number] Larry [name] is seen today for F/U of old IMI with CAD and elevated lipids. He had multiple interventions to the RCA in 2000 and 2001. His last myovue was in 2006. He is very active and feels that so long as he doesn't have angina he doesn't need a stress test. Since he had classic angina with his MI I told him I agree. He needs a refill on nitro. Last LDL in 2/14 was 67. He has some stiffness in his left knee. Last visit was in asymptomatic flutter. Self limited and converted and xarelto d/c    Echo 12/04/12 with EF 50% Moderate LAE reviewed and read  ECG today shows NSR rate 63 old IMI  Some more stiffness and generalized muscle pains.   ROS: Denies fever, malais, weight loss, blurry vision, decreased visual acuity, cough, sputum, SOB, hemoptysis, pleuritic pain, palpitaitons, heartburn, abdominal pain, melena, lower extremity edema, claudication, or rash.  All other systems reviewed and negative  General: Affect appropriate Healthy:  appears stated age 72: normal Neck supple with no adenopathy JVP normal no bruits no thyromegaly Lungs clear with no wheezing and good diaphragmatic motion Heart:  S1/S2 no murmur, no rub, gallop or click PMI normal Abdomen: benighn, BS positve, no tenderness, no AAA no bruit.  No HSM or HJR Distal pulses intact with no bruits No edema Neuro non-focal Skin warm and dry No muscular weakness   Current Outpatient Prescriptions  Medication Sig Dispense Refill  . aspirin 81 MG tablet Take 81 mg by mouth daily.      . metoprolol (LOPRESSOR) 50 MG tablet 1 1/2 TAB  TWICE  DAILY  180 tablet  3  . nitroGLYCERIN (NITROSTAT) 0.4 MG SL tablet Place 1 tablet (0.4 mg total) under the tongue every 5 (five) minutes as needed.  25 tablet  5  . pravastatin (PRAVACHOL) 40 MG tablet TAKE ONE (1) TABLET EACH DAY  30 tablet  8   No current facility-administered medications for this visit.     Allergies  Plavix  Electrocardiogram:  2/26  SR rate 89 old IMI  Assessment and Plan

## 2013-04-10 ENCOUNTER — Telehealth: Payer: Self-pay | Admitting: Cardiovascular Disease

## 2013-04-10 NOTE — Telephone Encounter (Deleted)
ERROR

## 2013-06-25 ENCOUNTER — Telehealth: Payer: Self-pay | Admitting: *Deleted

## 2013-06-25 NOTE — Telephone Encounter (Signed)
Stay off statin another 3 months and see if muscle pain/stiffness continues to improve

## 2013-06-25 NOTE — Telephone Encounter (Signed)
WHILE GIVING WIFE'S  LAB RESULTS  PT  WANTED TO LET  YOU KNOW  THAT  SINCE STOPPING PRAVASTATIN HAS NOTED SOME IMPROVEMENT IN MUSCLE STIFFNESS BUT  HAS NOT TOTALLY GONE AWAY  PT AWARE  WILL FORWARD TO DR  Johnsie Cancel FOR  REVIEW .Adonis Housekeeper

## 2013-06-25 NOTE — Telephone Encounter (Signed)
PT AWARE  WILL DISCUSS  AT NEXT OFFICE VISIT .Larry [name]

## 2013-07-31 ENCOUNTER — Other Ambulatory Visit: Payer: Self-pay | Admitting: Cardiovascular Disease

## 2013-07-31 ENCOUNTER — Telehealth: Payer: Self-pay | Admitting: Cardiovascular Disease

## 2013-07-31 DIAGNOSIS — E7800 Pure hypercholesterolemia, unspecified: Secondary | ICD-10-CM

## 2013-07-31 DIAGNOSIS — I2510 Atherosclerotic heart disease of native coronary artery without angina pectoris: Secondary | ICD-10-CM

## 2013-07-31 NOTE — Telephone Encounter (Signed)
Spoke with patient's wife who called for her husband, the patient.  Patient would like to know if he needs lab work prior to his 11/10 appointment with Dr. Johnsie Cancel.  Patient has been off statin for approximately 6 months.  I advised patient's wife that I will send Dr. Johnsie Cancel a note to find out if he wants to order lab work and we will call patient back next week when Dr. Johnsie Cancel returns to the office.  Patient's wife verbalized understanding and agreement.

## 2013-07-31 NOTE — Telephone Encounter (Signed)
New Problem:  Pt states he believes he is suppose to have blood work with his upcoming [place] appt. I dont see an order to schedule the labs.. Please advise

## 2013-08-04 NOTE — Telephone Encounter (Signed)
Lipid and liver before visit or day of if morning appt as he needs to be fasting

## 2013-08-05 NOTE — Telephone Encounter (Signed)
Spoke with patient's wife to inform her that Dr. Johnsie Cancel would like fasting liver and lipid panel prior to patient's appointment on 11/10.  Patient's wife states she is scheduled for labs on 11/7 and would like husband to come to lab here at [facility]. At same time.  I ordered labs and scheduled appointment. Patient's wife verbalized understanding and agreement.

## 2013-08-15 ENCOUNTER — Other Ambulatory Visit (INDEPENDENT_AMBULATORY_CARE_PROVIDER_SITE_OTHER): Payer: Medicare Other

## 2013-08-15 DIAGNOSIS — E7800 Pure hypercholesterolemia, unspecified: Secondary | ICD-10-CM

## 2013-08-15 DIAGNOSIS — I2510 Atherosclerotic heart disease of native coronary artery without angina pectoris: Secondary | ICD-10-CM

## 2013-08-15 LAB — HEPATIC FUNCTION PANEL
Albumin: 4 g/dL (ref 3.5–5.2)
Alkaline Phosphatase: 67 U/L (ref 39–117)
Total Bilirubin: 1.2 mg/dL (ref 0.3–1.2)
Total Protein: 7.1 g/dL (ref 6.0–8.3)

## 2013-08-15 LAB — LIPID PANEL
Cholesterol: 206 mg/dL — ABNORMAL HIGH (ref 0–200)
HDL: 31.5 mg/dL — ABNORMAL LOW (ref >39.00)
Total CHOL/HDL Ratio: 7
Triglycerides: 197 mg/dL — ABNORMAL HIGH (ref 0.0–149.0)
VLDL: 39.4 mg/dL (ref 0.0–40.0)

## 2013-08-15 LAB — LDL CHOLESTEROL, DIRECT: Direct LDL: 144.3 mg/dL

## 2013-08-18 ENCOUNTER — Ambulatory Visit (INDEPENDENT_AMBULATORY_CARE_PROVIDER_SITE_OTHER): Payer: Medicare Other | Admitting: Cardiovascular Disease

## 2013-08-18 ENCOUNTER — Encounter: Payer: Self-pay | Admitting: Cardiovascular Disease

## 2013-08-18 ENCOUNTER — Telehealth: Payer: Self-pay | Admitting: *Deleted

## 2013-08-18 VITALS — BP 140/74 | HR 66 | Ht 72.0 in | Wt 198.0 lb

## 2013-08-18 DIAGNOSIS — I4892 Unspecified atrial flutter: Secondary | ICD-10-CM

## 2013-08-18 DIAGNOSIS — I2510 Atherosclerotic heart disease of native coronary artery without angina pectoris: Secondary | ICD-10-CM

## 2013-08-18 DIAGNOSIS — E7800 Pure hypercholesterolemia, unspecified: Secondary | ICD-10-CM

## 2013-08-18 MED ORDER — PRAVASTATIN SODIUM 40 MG PO TABS: 40.0000 mg | ORAL_TABLET | Freq: Every evening | ORAL | Status: DC

## 2013-08-18 NOTE — Patient Instructions (Signed)
Your physician wants you to follow-up in: YEAR WITH DR NISHAN  You will receive a reminder letter in the mail two months in advance. If you don't receive a letter, please call our office to schedule the follow-up appointment.  Your physician recommends that you continue on your current medications as directed. Please refer to the Current Medication list given to you today. 

## 2013-08-18 NOTE — Assessment & Plan Note (Signed)
Maint NSR Continue ASA and beta blocker

## 2013-08-18 NOTE — Assessment & Plan Note (Signed)
Resume pravastatin 20 mg F/U labs in 6 months

## 2013-08-18 NOTE — Progress Notes (Signed)
Patient ID: Larry [name], male   DOB: 02-26-1941, 72 y.o.   MRN: [id number] Larry [name] is seen today for F/U of old IMI with CAD and elevated lipids. He had multiple interventions to the RCA in 2000 and 2001. His last myovue was in 2006. He is very active and feels that so long as he doesn't have angina he doesn't need a stress test. Since he had classic angina with his MI I told him I agree. He needs a refill on nitro. History of flutter now off xarelto and on beta blocker and ASA  Echo 12/04/12  with EF 50% Moderate LAE reviewed   ECG t2/26/14  shows NSR rate 72 old IMI  Labs 11/7  LDL 144  Still eating too much barbecue.  No chest pain  Aches and pains no better off pravastatin   ROS: Denies fever, malais, weight loss, blurry vision, decreased visual acuity, cough, sputum, SOB, hemoptysis, pleuritic pain, palpitaitons, heartburn, abdominal pain, melena, lower extremity edema, claudication, or rash.  All other systems reviewed and negative  General: Affect appropriate Healthy:  appears stated age 6: normal Neck supple with no adenopathy JVP normal no bruits no thyromegaly Lungs clear with no wheezing and good diaphragmatic motion Heart:  S1/S2 no murmur, no rub, gallop or click PMI normal Abdomen: benighn, BS positve, no tenderness, no AAA no bruit.  No HSM or HJR Distal pulses intact with no bruits No edema Neuro non-focal Skin warm and dry No muscular weakness   Current Outpatient Prescriptions  Medication Sig Dispense Refill  . aspirin 81 MG tablet Take 81 mg by mouth daily.      . metoprolol (LOPRESSOR) 50 MG tablet TAKE 1 AND 1/2 TABLETS TWICE A DAY  180 tablet  1  . nitroGLYCERIN (NITROSTAT) 0.4 MG SL tablet Place 1 tablet (0.4 mg total) under the tongue every 5 (five) minutes as needed.  25 tablet  5  . pravastatin (PRAVACHOL) 40 MG tablet TAKE ONE (1) TABLET EACH DAY  30 tablet  8   No current facility-administered medications for this visit.     Allergies  Plavix  Electrocardiogram:  SR rate 17 PAC LAE old IMI   Assessment and Plan

## 2013-08-18 NOTE — Telephone Encounter (Signed)
LDL is high change to lipitor 40 gm generic and repeat labs in 6 months   PT HAD APPT  TODAY   PT  TO RESTART   PRAVASTATIN  40 MG   1  QD PER DR NISHAN  STOPPED  MED  6 MONTHS  PRIOR./CY

## 2013-08-18 NOTE — Assessment & Plan Note (Signed)
Stable with no angina and good activity level.  Continue medical Rx  

## 2013-09-24 ENCOUNTER — Ambulatory Visit: Payer: Medicare Other | Admitting: Internal Medicine

## 2013-12-01 ENCOUNTER — Other Ambulatory Visit: Payer: Self-pay | Admitting: Cardiovascular Disease

## 2014-04-01 ENCOUNTER — Other Ambulatory Visit: Payer: Self-pay | Admitting: Cardiovascular Disease

## 2014-04-21 ENCOUNTER — Other Ambulatory Visit: Payer: Self-pay | Admitting: *Deleted

## 2014-04-21 MED ORDER — PRAVASTATIN SODIUM 40 MG PO TABS: 40.0000 mg | ORAL_TABLET | Freq: Every evening | ORAL | Status: DC

## 2014-04-29 ENCOUNTER — Telehealth: Payer: Self-pay | Admitting: *Deleted

## 2014-04-29 DIAGNOSIS — R0683 Snoring: Secondary | ICD-10-CM

## 2014-04-29 NOTE — Telephone Encounter (Signed)
PT'S WIFE  CALLED  WANTING  TO KNOW IF   YOU WOULD  ORDER   SLEEP STUDY   PT  SNORES  WILL FORWARD TO  DR Johnsie Cancel  FOR  REVIEW./CY

## 2014-04-29 NOTE — Telephone Encounter (Signed)
Ok to order sleep study and f/u with Dr Gwenette Greet to discuss results

## 2014-04-29 NOTE — Telephone Encounter (Signed)
PT  AWARE ./CY LEFT VOICE MAIL AT [place] [facility]   WHAT   TO NEEDS TO BE  DONE FOR  PT  TO  HAVE  SLEEP STUDY DONE./CY

## 2014-05-01 NOTE — Telephone Encounter (Signed)
RESPIRATORY CONTACTED , WILL  CALL PT  AND MAKE  ARRANGEMENTS  FOR  SLEEP  STUDY  AND  WILL LOCATE MD  IN THAT  AREA THAT  DEALS  WITH  THE  SLEEP STUDIES  FAMILY AND PT  UNABLE  TO  GIVE  MD  INFO  INCLUDING  SPELLING   OF MD .Adonis Housekeeper

## 2014-05-11 NOTE — Telephone Encounter (Signed)
CALLED  RESPIRATORY AT  [facility]  PER PT  HAS NOT  HAD  SLEEP STUDY SCHEDULED    PHONE  JUST  RINGS  NO ANSWER  AFTER  SEVERAL   RINGS WILL TRY LATER .Adonis Housekeeper

## 2014-05-11 NOTE — Telephone Encounter (Signed)
PER PT   SLEEP  STUDY  SCHEDULED  FOR Sunday 05-17-14  ./CY

## 2014-05-11 NOTE — Telephone Encounter (Signed)
SPOKE  WITH  BARBARA  HAD   NOTED  MESSAGE  WAS   LEFT  LAST  WEEK  PER PT  HAS  NOT HEARD  ANYTHING FROM   [place]  PER    BARBARA  TO CALL PT  AND  SET UP SLEEP  STUDY./CY

## 2014-05-12 ENCOUNTER — Telehealth: Payer: Self-pay | Admitting: Cardiovascular Disease

## 2014-05-12 NOTE — Telephone Encounter (Signed)
New message      Have we received the pre auth from the ins co for a sleep study.  Patient spoke with Altha Harm yesterday and someone here is supposed to get it ok'd.

## 2014-05-17 ENCOUNTER — Ambulatory Visit: Payer: Medicare HMO | Attending: Cardiovascular Disease | Admitting: Sleep Medicine

## 2014-05-17 VITALS — Ht 72.0 in | Wt 195.0 lb

## 2014-05-17 DIAGNOSIS — G4710 Hypersomnia, unspecified: Secondary | ICD-10-CM | POA: Diagnosis present

## 2014-05-17 DIAGNOSIS — R0683 Snoring: Secondary | ICD-10-CM

## 2014-05-17 DIAGNOSIS — G4730 Sleep apnea, unspecified: Secondary | ICD-10-CM | POA: Diagnosis present

## 2014-05-17 DIAGNOSIS — G4733 Obstructive sleep apnea (adult) (pediatric): Secondary | ICD-10-CM | POA: Insufficient documentation

## 2014-05-17 DIAGNOSIS — G4761 Periodic limb movement disorder: Secondary | ICD-10-CM | POA: Insufficient documentation

## 2014-05-20 NOTE — Sleep Study (Signed)
  Larry Stream A. Merlene Laughter, MD     [email or web address]        NOCTURNAL POLYSOMNOGRAM    LOCATION: SLEEP LAB FACILITY: [facility]   PHYSICIAN: Kayle Passarelli A. Merlene [name], M.D.   DATE OF STUDY: 05/17/2014.   REFERRING PHYSICIAN: Jenkins Rouge.  INDICATIONS: This is a 73 year old who presents with snoring and fatigue.  MEDICATIONS:  Prior to Admission medications   Medication Sig Start Date End Date Taking? Authorizing Provider  aspirin 81 MG tablet Take 81 mg by mouth daily.    Historical Provider, MD  HYDROcodone-acetaminophen (NORCO/VICODIN) 5-325 MG per tablet prn 07/21/13   Historical Provider, MD  metoprolol (LOPRESSOR) 50 MG tablet TAKE ONE AND ONE-HALF TABLET TWICE A DAY    Josue Hector, MD  nitroGLYCERIN (NITROSTAT) 0.4 MG SL tablet Place 1 tablet (0.4 mg total) under the tongue every 5 (five) minutes as needed. 03/06/13   Josue Hector, MD  pravastatin (PRAVACHOL) 40 MG tablet Take 1 tablet (40 mg total) by mouth every evening. 04/21/14   Josue Hector, MD      EPWORTH SLEEPINESS SCALE: 3.   BMI: 26.   ARCHITECTURAL SUMMARY: Total recording time was 436 minutes. Sleep efficiency 51 %. Sleep latency 59 minutes. REM latency 116 minutes. Stage NI 8 %, N2 63 % and N3 19 % and REM sleep 8 %.    RESPIRATORY DATA:  Baseline oxygen saturation is 95 %. The lowest saturation is 83 %. The diagnostic AHI is 9. The RDI is 9. The REM AHI is 28.  LIMB MOVEMENT SUMMARY: PLM index 8. The patient was noted to have frequent phasic EMG activity both during REM sleep and non-REM sleep.   ELECTROCARDIOGRAM SUMMARY: Average heart rate is 72 with atrial fibrillation/atrial flutter observed.   IMPRESSION:  1. Mild obstructive sleep apnea syndrome not required positive pressure treatment. 2. Mild periodic limb movement disorder of sleep. 3. Phasic EMG activity which can be associated with REM sleep behavior disorder.  Thanks for this referral.  Dimonique Bourdeau A. Merlene [name], M.D. Diplomat,  Tax adviser of Sleep Medicine.

## 2014-05-26 ENCOUNTER — Telehealth: Payer: Self-pay | Admitting: *Deleted

## 2014-05-26 NOTE — Telephone Encounter (Signed)
         Larry [name] ','<More Detail >>       Josue Hector, MD       Sent: Fri May 22, 2014 9:48 AM    To: Richmond Campbell, LPN                   Message     Sleep study mild apnea no need for CPAP        ----- Message -----    From: Richmond Campbell, LPN    Sent: [id number] 9:42 AM    To: Josue Hector, MD        FOR YOUR REVIEW                           Sleep Study Zaccary Kitz (MR# [id number])       Sleep Study Info     Author Note Status Last Update User Last Update Date/Time    Phillips Odor, MD Signed Phillips Odor, MD 05/20/2014 9:58 PM           Sleep Study      Sterrett A. Merlene Laughter, MD [email or web address]  NOCTURNAL POLYSOMNOGRAM  LOCATION: SLEEP LAB FACILITY: [facility]  PHYSICIAN: Kofi A. Merlene Laughter, M.D.  DATE OF STUDY: 05/17/2014.  REFERRING PHYSICIAN: Jenkins Rouge.  INDICATIONS: This is a 73 year old who presents with snoring and fatigue.  MEDICATIONS:     Prior to Admission medications     Medication  Sig  Start Date  End Date  Taking?  Authorizing Provider     aspirin 81 MG tablet  Take 81 mg by mouth daily.     Historical Provider, MD     HYDROcodone-acetaminophen (NORCO/VICODIN) 5-325 MG per tablet  prn  07/21/13    Historical Provider, MD     metoprolol (LOPRESSOR) 50 MG tablet  TAKE ONE AND ONE-HALF TABLET TWICE A DAY     Josue Hector, MD     nitroGLYCERIN (NITROSTAT) 0.4 MG SL tablet  Place 1 tablet (0.4 mg total) under the tongue every 5 (five) minutes as needed.  03/06/13    Josue Hector, MD     pravastatin (PRAVACHOL) 40 MG tablet  Take 1 tablet (40 mg total) by mouth every evening.  04/21/14    Josue Hector, MD     EPWORTH SLEEPINESS SCALE: 3.  BMI: 26.  ARCHITECTURAL SUMMARY: Total recording time was 436 minutes. Sleep efficiency 51 %. Sleep latency 59 minutes. REM latency 116 minutes. Stage NI 8 %, N2 63 % and N3 19 % and REM sleep 8 %.  RESPIRATORY DATA: Baseline oxygen  saturation is 95 %. The lowest saturation is 83 %. The diagnostic AHI is 9. The RDI is 9. The REM AHI is 28.  LIMB MOVEMENT SUMMARY: PLM index 8. The patient was noted to have frequent phasic EMG activity both during REM sleep and non-REM sleep.  ELECTROCARDIOGRAM SUMMARY: Average heart rate is 72 with atrial fibrillation/atrial flutter observed.  IMPRESSION:  1. Mild obstructive sleep apnea syndrome not required positive pressure treatment.  2. Mild periodic limb movement disorder of sleep.  3. Phasic EMG activity which can be associated with REM sleep behavior disorder.  Thanks for this referral.  Kofi A. Merlene Laughter, M.D. Diplomat, Tax adviser of Sleep Medicine.           PT  AWARE  OF SLEEP STUDY RESULTS .Adonis Housekeeper

## 2014-06-02 NOTE — Telephone Encounter (Signed)
AETNA, [facility]SOLNS [id number] EXP 08-11-14/CH

## 2014-06-03 ENCOUNTER — Other Ambulatory Visit: Payer: Self-pay | Admitting: Cardiovascular Disease

## 2014-06-11 ENCOUNTER — Other Ambulatory Visit: Payer: Self-pay

## 2014-06-11 MED ORDER — NITROGLYCERIN 0.4 MG SL SUBL: 0.4000 mg | SUBLINGUAL_TABLET | SUBLINGUAL | Status: DC | PRN

## 2014-07-29 ENCOUNTER — Other Ambulatory Visit: Payer: Self-pay | Admitting: Cardiovascular Disease

## 2014-08-28 ENCOUNTER — Ambulatory Visit (INDEPENDENT_AMBULATORY_CARE_PROVIDER_SITE_OTHER): Payer: Medicare HMO | Admitting: Cardiovascular Disease

## 2014-08-28 ENCOUNTER — Encounter: Payer: Self-pay | Admitting: Cardiovascular Disease

## 2014-08-28 VITALS — BP 130/88 | HR 65 | Ht 71.0 in | Wt 199.0 lb

## 2014-08-28 DIAGNOSIS — E785 Hyperlipidemia, unspecified: Secondary | ICD-10-CM

## 2014-08-28 DIAGNOSIS — Z79899 Other long term (current) drug therapy: Secondary | ICD-10-CM

## 2014-08-28 DIAGNOSIS — E7800 Pure hypercholesterolemia, unspecified: Secondary | ICD-10-CM

## 2014-08-28 DIAGNOSIS — I4891 Unspecified atrial fibrillation: Secondary | ICD-10-CM

## 2014-08-28 LAB — LIPID PANEL
CHOL/HDL RATIO: 5
Cholesterol: 145 mg/dL (ref 0–200)
HDL: 31 mg/dL — AB (ref >39.00)
LDL Cholesterol: 91 mg/dL (ref 0–99)
NonHDL: 114
TRIGLYCERIDES: 115 mg/dL (ref 0.0–149.0)
VLDL: 23 mg/dL (ref 0.0–40.0)

## 2014-08-28 LAB — HEPATIC FUNCTION PANEL
ALT: 13 U/L (ref 0–53)
AST: 20 U/L (ref 0–37)
Albumin: 4.2 g/dL (ref 3.5–5.2)
Alkaline Phosphatase: 70 U/L (ref 39–117)
Bilirubin, Direct: 0.1 mg/dL (ref 0.0–0.3)
TOTAL PROTEIN: 7.3 g/dL (ref 6.0–8.3)
Total Bilirubin: 1 mg/dL (ref 0.2–1.2)

## 2014-08-28 MED ORDER — PRAVASTATIN SODIUM 40 MG PO TABS: 40.0000 mg | ORAL_TABLET | Freq: Every evening | ORAL | Status: DC

## 2014-08-28 MED ORDER — METOPROLOL TARTRATE 50 MG PO TABS: ORAL_TABLET | ORAL | Status: DC

## 2014-08-28 NOTE — Progress Notes (Signed)
Patient ID: Larry [name], male   DOB: Aug 25, 1941, 73 y.o.   MRN: [id number] Freddy is seen today for F/U of old IMI with CAD and elevated lipids. He had multiple interventions to the RCA in 2000 and 2001. His last myovue was in 2006. He is very active and feels that so long as he doesn't have angina he doesn't need a stress test. Since he had classic angina with his MI I told him I agree. He needs a refill on nitro. History of flutter now off xarelto and on beta blocker and ASA  Echo 12/04/12 with EF 50% Moderate LAE reviewed  ECG t2/26/14 shows NSR rate 56 old IMI  Labs 11/7 LDL 144 Still eating too much barbecue. No chest pain Aches and pains no better off pravastatin  Needs lab work for chol today      ROS: Denies fever, malais, weight loss, blurry vision, decreased visual acuity, cough, sputum, SOB, hemoptysis, pleuritic pain, palpitaitons, heartburn, abdominal pain, melena, lower extremity edema, claudication, or rash.  All other systems reviewed and negative  General: Affect appropriate Healthy:  appears stated age 34: normal Neck supple with no adenopathy JVP normal no bruits no thyromegaly Lungs clear with no wheezing and good diaphragmatic motion Heart:  S1/S2 no murmur, no rub, gallop or click PMI normal Abdomen: benighn, BS positve, no tenderness, no AAA no bruit.  No HSM or HJR Distal pulses intact with no bruits No edema Neuro non-focal Skin warm and dry No muscular weakness   Current Outpatient Prescriptions  Medication Sig Dispense Refill  . aspirin 81 MG tablet Take 81 mg by mouth daily.    Marland Kitchen HYDROcodone-acetaminophen (NORCO/VICODIN) 5-325 MG per tablet prn    . metoprolol (LOPRESSOR) 50 MG tablet TAKE ONE AND ONE-HALF TABLETS TWICE A DAY 90 tablet 0  . nitroGLYCERIN (NITROSTAT) 0.4 MG SL tablet Place 1 tablet (0.4 mg total) under the tongue every 5 (five) minutes as needed. 25 tablet 1  . pravastatin (PRAVACHOL) 40 MG tablet Take 1 tablet (40 mg total)  by mouth every evening. 90 tablet 3   No current facility-administered medications for this visit.    Allergies  Plavix  Electrocardiogram:  08/18/13  SR rate 66 IMI PAC   Today SR rate 13  LAE old IMI no PAC;s since 2014  Assessment and Plan

## 2014-08-28 NOTE — Assessment & Plan Note (Signed)
Stable with no angina and good activity level.  Continue medical Rx Has new nitro ECG stable

## 2014-08-28 NOTE — Assessment & Plan Note (Signed)
Continue statin  Lab work today

## 2014-08-28 NOTE — Assessment & Plan Note (Signed)
No palpitations maint NSR continue asa and beta blocker

## 2014-08-28 NOTE — Patient Instructions (Signed)
Your physician wants you to follow-up in:     [place] will receive a reminder letter in the mail two months in advance. If you don't receive a letter, please call our office to schedule the follow-up appointment. Your physician has recommended you make the following change in your medication:  DECREASE METOPROLOL TO  TWICE DAILY Your physician recommends that you return for lab work in: [place] LIVER

## 2014-09-25 ENCOUNTER — Telehealth: Payer: Self-pay | Admitting: Cardiovascular Disease

## 2015-02-24 NOTE — Progress Notes (Signed)
Patient ID: Larry [name], male   DOB: October 27, 1940, 74 y.o.   MRN: [id number]   Larry [name] is a 74 y.o.  seen today for F/U of old IMI with CAD and elevated lipids. He had multiple interventions to the RCA in 2000 and 2001. His last myovue was in 2006. He is very active and feels that so long as he doesn't have angina he doesn't need a stress test. Since he had classic angina with his MI I told him I agree. He needs a refill on nitro. History of flutter now off xarelto and on beta blocker and ASA  Echo 12/04/12 with EF 50% Moderate LAE reviewed  ECG t2/26/14 shows NSR rate 5 old IMI  Labs 11//20/15  LDL 91     Still eating too much barbecue. No chest pain Aches and pains no better off pravastatin Needs new nitro Has some exercise limitations from arthritis in left knee    ROS: Denies fever, malais, weight loss, blurry vision, decreased visual acuity, cough, sputum, SOB, hemoptysis, pleuritic pain, palpitaitons, heartburn, abdominal pain, melena, lower extremity edema, claudication, or rash.  All other systems reviewed and negative  General: Affect appropriate Healthy:  appears stated age 74: normal Neck supple with no adenopathy JVP normal no bruits no thyromegaly Lungs clear with no wheezing and good diaphragmatic motion Heart:  S1/S2 no murmur, no rub, gallop or click PMI normal Abdomen: benighn, BS positve, no tenderness, no AAA no bruit.  No HSM or HJR Distal pulses intact with no bruits No edema Neuro non-focal Skin warm and dry No muscular weakness   Current Outpatient Prescriptions  Medication Sig Dispense Refill  . aspirin 81 MG tablet Take 81 mg by mouth daily.    . metoprolol (LOPRESSOR) 50 MG tablet 1 1/2  TABS  TWICE  DAILY 90 tablet 11  . nitroGLYCERIN (NITROSTAT) 0.4 MG SL tablet Place 1 tablet (0.4 mg total) under the tongue every 5 (five) minutes as needed. 25 tablet 1  . pravastatin (PRAVACHOL) 40 MG tablet Take 1 tablet (40 mg total) by mouth every evening.  (Patient taking differently: Take 40 mg by mouth every evening. Pt takes 1/2 tablet by mouth daily) 30 tablet 11   No current facility-administered medications for this visit.    Allergies  Plavix  Electrocardiogram:  08/18/13  SR rate 66 IMI PAC   08/28/14   SR rate 81  LAE old IMI no PAC;s since 2014  Assessment and Plan CAD:  Stable with no angina and good activity level.  Continue medical Rx  Nitro called in Chol:   Cholesterol is at goal.  Continue current dose of statin and diet Rx.  No myalgias or side effects.  F/U  LFT's in 6 months. Lab Results  Component Value Date   LDLCALC 91 08/28/2014  Arthritis:  Encouraged him to see ortho to see if injections or arthroscopic procedure would help HTN:  Well controlled.  Continue current medications and low sodium Dash type diet.     F/U with me in 6 months

## 2015-02-25 ENCOUNTER — Ambulatory Visit (INDEPENDENT_AMBULATORY_CARE_PROVIDER_SITE_OTHER): Payer: Medicare HMO | Admitting: Cardiovascular Disease

## 2015-02-25 ENCOUNTER — Encounter: Payer: Self-pay | Admitting: Cardiovascular Disease

## 2015-02-25 VITALS — BP 130/84 | HR 71 | Ht 71.0 in | Wt 200.0 lb

## 2015-02-25 DIAGNOSIS — I2510 Atherosclerotic heart disease of native coronary artery without angina pectoris: Secondary | ICD-10-CM | POA: Diagnosis not present

## 2015-02-25 DIAGNOSIS — I2583 Coronary atherosclerosis due to lipid rich plaque: Principal | ICD-10-CM

## 2015-02-25 MED ORDER — NITROGLYCERIN 0.4 MG SL SUBL: 0.4000 mg | SUBLINGUAL_TABLET | SUBLINGUAL | Status: DC | PRN

## 2015-02-25 NOTE — Patient Instructions (Signed)
Medication Instructions:  NO CHANGES  Labwork: NONE  Testing/Procedures: NONE  Follow-Up: Your physician wants you to follow-up in: 6 MONTHS WITH DR NISHAN You will receive a reminder letter in the mail two months in advance. If you don't receive a letter, please call our office to schedule the follow-up appointment.  Any Other Special Instructions Will Be Listed Below (If Applicable).   

## 2015-08-25 ENCOUNTER — Other Ambulatory Visit: Payer: Self-pay | Admitting: Cardiovascular Disease

## 2015-08-29 NOTE — Progress Notes (Signed)
Patient ID: Larry [name], male   DOB: 11/08/1940, 74 y.o.   MRN: [id number]   Larry [name] is a 74 y.o.  seen today for F/U of old IMI with CAD and elevated lipids. He had multiple interventions to the RCA in 2000 and 2001. His last myovue was in 2006. He is very active and feels that so long as he doesn't have angina he doesn't need a stress test. Since he had classic angina with his MI I told him I agree. He needs a refill on nitro. History of flutter now off xarelto and on beta blocker and ASA  Echo 12/04/12 with EF 50% Moderate LAE reviewed  ECG t2/26/14 shows NSR rate 54 old IMI  Labs 11//20/15  LDL 91    Eating less  barbecue. No chest pain Aches and pains no better off pravastatin Needs new nitro Has some exercise limitations from arthritis in left knee    ROS: Denies fever, malais, weight loss, blurry vision, decreased visual acuity, cough, sputum, SOB, hemoptysis, pleuritic pain, palpitaitons, heartburn, abdominal pain, melena, lower extremity edema, claudication, or rash.  All other systems reviewed and negative  General: Affect appropriate Healthy:  appears stated age 62: normal Neck supple with no adenopathy JVP normal no bruits no thyromegaly Lungs clear with no wheezing and good diaphragmatic motion Heart:  S1/S2 no murmur, no rub, gallop or click PMI normal Abdomen: benighn, BS positve, no tenderness, no AAA no bruit.  No HSM or HJR Distal pulses intact with no bruits No edema Neuro non-focal Skin warm and dry No muscular weakness   Current Outpatient Prescriptions  Medication Sig Dispense Refill  . aspirin 81 MG tablet Take 81 mg by mouth daily.    . metoprolol (LOPRESSOR) 50 MG tablet Take 1.5 tablets (75 mg total) by mouth 2 (two) times daily. 90 tablet 3  . nitroGLYCERIN (NITROSTAT) 0.4 MG SL tablet Place 0.4 mg under the tongue every 5 (five) minutes as needed for chest pain (3 doses max).    . pravastatin (PRAVACHOL) 40 MG tablet Take 40 mg by mouth daily.      No current facility-administered medications for this visit.    Allergies  Plavix  Electrocardiogram:  08/18/13  SR rate 66 IMI PAC   08/28/14   SR rate 24  LAE old IMI no PAC;s since 2014  09/01/15  SR old IMI no change   Assessment and Plan CAD:  Distant IMI 2001 Last myovue 2006 non ischemic Stable with no angina and good activity level.  Continue medical Rx  Nitro called in  Chol:   Cholesterol is at goal.  Continue current dose of statin and diet Rx.  No myalgias or side effects.  F/U  LFT's in 6 months. Lab Results  Component Value Date   LDLCALC 91 08/28/2014  Arthritis:  Encouraged him to see ortho to see if injections or arthroscopic procedure would help HTN:  Well controlled.  Continue current medications and low sodium Dash type diet.   Flutter:  Distant off xarelto on asa and beta blocker no palpitations stable    F/U with me in 6 months

## 2015-09-01 ENCOUNTER — Encounter: Payer: Self-pay | Admitting: Cardiovascular Disease

## 2015-09-01 ENCOUNTER — Ambulatory Visit (INDEPENDENT_AMBULATORY_CARE_PROVIDER_SITE_OTHER): Payer: Medicare HMO | Admitting: Cardiovascular Disease

## 2015-09-01 VITALS — BP 128/90 | HR 82 | Ht 72.0 in | Wt 199.1 lb

## 2015-09-01 DIAGNOSIS — I4891 Unspecified atrial fibrillation: Secondary | ICD-10-CM | POA: Diagnosis not present

## 2015-09-01 DIAGNOSIS — Z79899 Other long term (current) drug therapy: Secondary | ICD-10-CM

## 2015-09-01 LAB — HEPATIC FUNCTION PANEL
ALBUMIN: 4 g/dL (ref 3.6–5.1)
ALT: 16 U/L (ref 9–46)
AST: 19 U/L (ref 10–35)
Alkaline Phosphatase: 72 U/L (ref 40–115)
Bilirubin, Direct: 0.2 mg/dL (ref <=0.2)
Indirect Bilirubin: 0.8 mg/dL (ref 0.2–1.2)
Total Bilirubin: 1 mg/dL (ref 0.2–1.2)
Total Protein: 7 g/dL (ref 6.1–8.1)

## 2015-09-01 LAB — LIPID PANEL
Cholesterol: 134 mg/dL (ref 125–200)
HDL: 25 mg/dL — ABNORMAL LOW (ref >=40)
LDL Cholesterol: 79 mg/dL (ref <130)
TRIGLYCERIDES: 152 mg/dL — AB (ref <150)
Total CHOL/HDL Ratio: 5.4 Ratio — ABNORMAL HIGH (ref <=5.0)
VLDL: 30 mg/dL (ref <30)

## 2015-09-01 MED ORDER — PRAVASTATIN SODIUM 40 MG PO TABS: 40.0000 mg | ORAL_TABLET | Freq: Every evening | ORAL | Status: DC

## 2015-09-01 NOTE — Patient Instructions (Addendum)
Medication Instructions:  Your physician recommends that you continue on your current medications as directed. Please refer to the Current Medication list given to you today.   Labwork: TODAY:  LFT & LIPIDS  Testing/Procedures: None ordered  Follow-Up: Your physician wants you to follow-up in: [place]. Johnsie Cancel.  You will receive a reminder letter in the mail two months in advance. If you don't receive a letter, please call our office to schedule the follow-up appointment.   Any Other Special Instructions Will Be Listed Below (If Applicable).     If you need a refill on your cardiac medications before your next appointment, please call your pharmacy.

## 2015-12-24 ENCOUNTER — Other Ambulatory Visit: Payer: Self-pay | Admitting: Cardiovascular Disease

## 2016-04-10 NOTE — Progress Notes (Signed)
Patient ID: Larry [name], male   DOB: 09/13/1941, 75 y.o.   MRN: [id number]   Larry [name] is a 75 y.o.  seen today for F/U of old IMI with CAD and elevated lipids. He had multiple interventions to the RCA in 2000 and 2001. His last myovue was in 2006. He is very active and feels that so long as he doesn't have angina he doesn't need a stress test. Since he had classic angina with his MI I told him I agree. He needs a refill on nitro. History of flutter now off xarelto and on beta blocker and ASA  Echo 12/04/12 with EF 50% Moderate LAE reviewed  ECG t2/26/14 shows NSR rate 5 old IMI  Labs 11//20/15  LDL 91    Eating less  barbecue. No chest pain Aches and pains no better off pravastatin Needs new nitro Has some exercise limitations from arthritis in left knee  And now right hip Will see Dr Aline Brochure in [place] to get evaluated    ROS: Denies fever, malais, weight loss, blurry vision, decreased visual acuity, cough, sputum, SOB, hemoptysis, pleuritic pain, palpitaitons, heartburn, abdominal pain, melena, lower extremity edema, claudication, or rash.  All other systems reviewed and negative  General: Affect appropriate Healthy:  appears stated age 62: normal Neck supple with no adenopathy JVP normal no bruits no thyromegaly Lungs clear with no wheezing and good diaphragmatic motion Heart:  S1/S2 no murmur, no rub, gallop or click PMI normal Abdomen: benighn, BS positve, no tenderness, no AAA no bruit.  No HSM or HJR Distal pulses intact with no bruits No edema Neuro non-focal Skin warm and dry No muscular weakness   Current Outpatient Prescriptions  Medication Sig Dispense Refill  . aspirin 81 MG tablet Take 81 mg by mouth daily.    . metoprolol (LOPRESSOR) 50 MG tablet TAKE ONE AND ONE-HALF TABLETS BY MOUTH TWO TIMES DAILY 90 tablet 6  . nitroGLYCERIN (NITROSTAT) 0.4 MG SL tablet Place 0.4 mg under the tongue every 5 (five) minutes as needed for chest pain (3 doses max).     . pravastatin (PRAVACHOL) 40 MG tablet Take 1 tablet (40 mg total) by mouth every evening. 30 tablet 11   No current facility-administered medications for this visit.    Allergies  Plavix  Electrocardiogram:  08/18/13  SR rate 66 IMI PAC   08/28/14   SR rate 61  LAE old IMI no PAC;s since 2014  09/01/15  SR old IMI no change   Assessment and Plan CAD:  Distant IMI 2001 Last myovue 2006 non ischemic Stable with no angina and good activity level.  Continue medical Rx  Nitro called in  Will need myovue before Any general anesthesia   Chol:   Cholesterol is at goal.  Continue current dose of statin and diet Rx.  No myalgias or side effects.  Labs to be repeated today   Lab Results  Component Value Date   [facility] 79 09/01/2015  Arthritis:  Encouraged him to see ortho to see if injections or arthroscopic procedure would help Dr Aline Brochure   HTN:  Well controlled.  Continue current medications and low sodium Dash type diet.   Flutter:  Distant off xarelto on asa and beta blocker no palpitations stable    F/U with me in 6 months   Jenkins Rouge

## 2016-04-12 ENCOUNTER — Encounter (INDEPENDENT_AMBULATORY_CARE_PROVIDER_SITE_OTHER): Payer: Self-pay

## 2016-04-12 ENCOUNTER — Encounter: Payer: Self-pay | Admitting: Cardiovascular Disease

## 2016-04-12 ENCOUNTER — Ambulatory Visit (INDEPENDENT_AMBULATORY_CARE_PROVIDER_SITE_OTHER): Payer: Medicare HMO | Admitting: Cardiovascular Disease

## 2016-04-12 VITALS — BP 140/92 | HR 88 | Ht 72.0 in | Wt 192.1 lb

## 2016-04-12 DIAGNOSIS — I2583 Coronary atherosclerosis due to lipid rich plaque: Principal | ICD-10-CM

## 2016-04-12 DIAGNOSIS — Z79899 Other long term (current) drug therapy: Secondary | ICD-10-CM | POA: Diagnosis not present

## 2016-04-12 DIAGNOSIS — E7800 Pure hypercholesterolemia, unspecified: Secondary | ICD-10-CM | POA: Diagnosis not present

## 2016-04-12 DIAGNOSIS — I2510 Atherosclerotic heart disease of native coronary artery without angina pectoris: Secondary | ICD-10-CM

## 2016-04-12 LAB — LIPID PANEL
CHOL/HDL RATIO: 3.8 ratio (ref <=5.0)
Cholesterol: 139 mg/dL (ref 125–200)
HDL: 37 mg/dL — ABNORMAL LOW (ref >=40)
LDL CALC: 77 mg/dL (ref <130)
Triglycerides: 127 mg/dL (ref <150)
VLDL: 25 mg/dL (ref <30)

## 2016-04-12 LAB — HEPATIC FUNCTION PANEL
ALT: 12 U/L (ref 9–46)
AST: 17 U/L (ref 10–35)
Albumin: 4.4 g/dL (ref 3.6–5.1)
Alkaline Phosphatase: 68 U/L (ref 40–115)
BILIRUBIN DIRECT: 0.4 mg/dL — AB (ref <=0.2)
BILIRUBIN INDIRECT: 1.5 mg/dL — AB (ref 0.2–1.2)
TOTAL PROTEIN: 7.1 g/dL (ref 6.1–8.1)
Total Bilirubin: 1.9 mg/dL — ABNORMAL HIGH (ref 0.2–1.2)

## 2016-04-12 NOTE — Patient Instructions (Addendum)
Medication Instructions:  Your physician recommends that you continue on your current medications as directed. Please refer to the Current Medication list given to you today.  Labwork: Lipid and Liver panel  Testing/Procedures: NONE  Follow-Up: Your physician wants you to follow-up in: 6 months with Dr. Johnsie Cancel. You will receive a reminder letter in the mail two months in advance. If you don't receive a letter, please call our office to schedule the follow-up appointment.   If you need a refill on your cardiac medications before your next appointment, please call your pharmacy.

## 2016-07-29 ENCOUNTER — Other Ambulatory Visit: Payer: Self-pay | Admitting: Cardiovascular Disease

## 2016-08-08 DIAGNOSIS — R69 Illness, unspecified: Secondary | ICD-10-CM | POA: Diagnosis not present

## 2016-09-27 ENCOUNTER — Other Ambulatory Visit: Payer: Self-pay | Admitting: Cardiovascular Disease

## 2016-09-27 DIAGNOSIS — I4891 Unspecified atrial fibrillation: Secondary | ICD-10-CM

## 2016-10-16 ENCOUNTER — Ambulatory Visit: Payer: Medicare HMO | Admitting: Cardiovascular Disease

## 2016-11-23 NOTE — Progress Notes (Signed)
Patient ID: Larry [name], male   DOB: 04-02-1941, 76 y.o.   MRN: [id number]   Larry [name] is a 76 y.o.  seen today for F/U of old IMI with CAD and elevated lipids. He had multiple interventions to the RCA in 2000 and 2001. His last myovue was in 2006. He is very active and feels that so long as he doesn't have angina he doesn't need a stress test. Since he had classic angina with his MI I told him I agree. He needs a refill on nitro. History of flutter now off xarelto and on beta blocker and ASA  Echo 12/04/12 with EF 50% Moderate LAE reviewed  ECG t2/26/14 shows NSR rate 43 old IMI  Labs 11//20/15  LDL 91    Eating less  barbecue. No chest pain Aches and pains no better off pravastatin Needs new nitro Has some exercise limitations from arthritis in left knee  And now right hip Will see Dr Aline Brochure in [place] to get evaluated   In office today in asymptomatic flutter  Documented with ECG   ROS: Denies fever, malais, weight loss, blurry vision, decreased visual acuity, cough, sputum, SOB, hemoptysis, pleuritic pain, palpitaitons, heartburn, abdominal pain, melena, lower extremity edema, claudication, or rash.  All other systems reviewed and negative  General: Affect appropriate Healthy:  appears stated age 37: normal Neck supple with no adenopathy JVP normal no bruits no thyromegaly Lungs clear with no wheezing and good diaphragmatic motion Heart:  S1/S2 no murmur, no rub, gallop or click PMI normal Abdomen: benighn, BS positve, no tenderness, no AAA no bruit.  No HSM or HJR Distal pulses intact with no bruits No edema Neuro non-focal Skin warm and dry No muscular weakness   Current Outpatient Prescriptions  Medication Sig Dispense Refill  . aspirin 81 MG tablet Take 81 mg by mouth daily.    . metoprolol (LOPRESSOR) 50 MG tablet TAKE ONE AND ONE-HALF TABLET BY MOUTH TWO TIMES DAILY 90 tablet 8  . nitroGLYCERIN (NITROSTAT) 0.4 MG SL tablet Place 0.4 mg under the tongue every  5 (five) minutes as needed for chest pain (3 doses max).    . pravastatin (PRAVACHOL) 40 MG tablet TAKE ONE TABLET BY MOUTH IN THE EVENING. 30 tablet 6  . rivaroxaban (XARELTO) 20 MG TABS tablet Take 1 tablet (20 mg total) by mouth daily with supper. 90 tablet 3  . rivaroxaban (XARELTO) 20 MG TABS tablet Take 1 tablet (20 mg total) by mouth daily with supper. 30 tablet 0   No current facility-administered medications for this visit.     Allergies  Plavix [clopidogrel bisulfate]  Electrocardiogram:  08/18/13  SR rate 66 IMI PAC   08/28/14   SR rate 108  LAE old IMI no PAC;s since 2014  09/01/15  SR old IMI no change 12/01/16  Flutter rates in 70's   Assessment and Plan CAD:  Distant IMI 2001 Last myovue 2006 non ischemic Stable with no angina and good activity level.  Continue medical Rx  Nitro called in  Will need myovue before Any general anesthesia   Chol:   Cholesterol is at goal.  Continue current dose of statin and diet Rx.  No myalgias or side effects.  Labs to be repeated today   Lab Results  Component Value Date   [facility] 77 04/12/2016  Arthritis:  Encouraged him to see ortho to see if injections or arthroscopic procedure would help Dr Aline Brochure   HTN:  Well controlled.  Continue current medications and low  sodium Dash type diet.   Flutter: Asymptomatic rate ok on beta blocker. Start xarelto CBC PLT today Event monitor to see if  He is going in/out F/U afib clinic and if still in flutter can consider ablation or [facility] with me   F/U with me in 3 months   Jenkins Rouge

## 2016-12-01 ENCOUNTER — Encounter: Payer: Self-pay | Admitting: Cardiovascular Disease

## 2016-12-01 ENCOUNTER — Ambulatory Visit (INDEPENDENT_AMBULATORY_CARE_PROVIDER_SITE_OTHER): Payer: Medicare HMO | Admitting: Cardiovascular Disease

## 2016-12-01 ENCOUNTER — Telehealth (HOSPITAL_COMMUNITY): Payer: Self-pay | Admitting: *Deleted

## 2016-12-01 VITALS — BP 140/90 | HR 62 | Ht 72.0 in | Wt 200.8 lb

## 2016-12-01 DIAGNOSIS — I4892 Unspecified atrial flutter: Secondary | ICD-10-CM

## 2016-12-01 DIAGNOSIS — Z79899 Other long term (current) drug therapy: Secondary | ICD-10-CM | POA: Diagnosis not present

## 2016-12-01 DIAGNOSIS — I2583 Coronary atherosclerosis due to lipid rich plaque: Secondary | ICD-10-CM | POA: Diagnosis not present

## 2016-12-01 DIAGNOSIS — I2510 Atherosclerotic heart disease of native coronary artery without angina pectoris: Secondary | ICD-10-CM | POA: Diagnosis not present

## 2016-12-01 LAB — CBC WITH DIFFERENTIAL/PLATELET
BASOS: 1 %
Basophils Absolute: 0.1 10*3/uL (ref 0.0–0.2)
EOS (ABSOLUTE): 0.1 10*3/uL (ref 0.0–0.4)
EOS: 1 %
HEMATOCRIT: 48.1 % (ref 37.5–51.0)
Hemoglobin: 16.6 g/dL (ref 13.0–17.7)
IMMATURE GRANS (ABS): 0 10*3/uL (ref 0.0–0.1)
Immature Granulocytes: 0 %
LYMPHS: 23 %
Lymphocytes Absolute: 2.1 10*3/uL (ref 0.7–3.1)
MCH: 32.2 pg (ref 26.6–33.0)
MCHC: 34.5 g/dL (ref 31.5–35.7)
MCV: 93 fL (ref 79–97)
Monocytes Absolute: 0.9 10*3/uL (ref 0.1–0.9)
Monocytes: 10 %
NEUTROS ABS: 6 10*3/uL (ref 1.4–7.0)
Neutrophils: 65 %
PLATELETS: 147 10*3/uL — AB (ref 150–379)
RBC: 5.15 x10E6/uL (ref 4.14–5.80)
RDW: 14.8 % (ref 12.3–15.4)
WBC: 9.2 10*3/uL (ref 3.4–10.8)

## 2016-12-01 LAB — BASIC METABOLIC PANEL
BUN/Creatinine Ratio: 15 (ref 10–24)
BUN: 16 mg/dL (ref 8–27)
CALCIUM: 9.5 mg/dL (ref 8.6–10.2)
CO2: 22 mmol/L (ref 18–29)
CREATININE: 1.09 mg/dL (ref 0.76–1.27)
Chloride: 103 mmol/L (ref 96–106)
GFR, EST AFRICAN AMERICAN: 77 mL/min/{1.73_m2} (ref >59)
GFR, EST NON AFRICAN AMERICAN: 67 mL/min/{1.73_m2} (ref >59)
Glucose: 112 mg/dL — ABNORMAL HIGH (ref 65–99)
POTASSIUM: 4.1 mmol/L (ref 3.5–5.2)
SODIUM: 144 mmol/L (ref 134–144)

## 2016-12-01 MED ORDER — RIVAROXABAN 20 MG PO TABS: 20.0000 mg | ORAL_TABLET | Freq: Every day | ORAL | 3 refills | Status: DC

## 2016-12-01 MED ORDER — RIVAROXABAN 20 MG PO TABS: 20.0000 mg | ORAL_TABLET | Freq: Every day | ORAL | 0 refills | Status: DC

## 2016-12-01 NOTE — Patient Instructions (Addendum)
Medication Instructions:  Your physician has recommended you make the following change in your medication:  1-Xarelto 20 mg by mouth daily with the largest meal of the day.  Labwork: Your physician recommends that you have lab work today - CBC and BMET  Testing/Procedures: Your physician has recommended that you wear an event monitor. Event monitors are medical devices that record the heart's electrical activity. Doctors most often [place] these monitors to diagnose arrhythmias. Arrhythmias are problems with the speed or rhythm of the heartbeat. The monitor is a small, portable device. You can wear one while you do your normal daily activities. This is usually used to diagnose what is causing palpitations/syncope (passing out).  Follow-Up: Your physician recommends that you schedule a follow-up appointment in 4 weeks with A. Fib clinic to discuss cardioversion.  Your physician wants you to follow-up in: 3 months with Dr. Johnsie Cancel.   If you need a refill on your cardiac medications before your next appointment, please call your pharmacy.

## 2016-12-01 NOTE — Telephone Encounter (Signed)
Pt scheduled to 03/26 for consult with Roderic Palau, NP [id number]

## 2016-12-03 ENCOUNTER — Emergency Department (HOSPITAL_COMMUNITY): Payer: Medicare HMO

## 2016-12-03 ENCOUNTER — Encounter (HOSPITAL_COMMUNITY): Payer: Self-pay | Admitting: Emergency Medicine

## 2016-12-03 ENCOUNTER — Emergency Department (HOSPITAL_COMMUNITY)
Admission: EM | Admit: 2016-12-03 | Discharge: 2016-12-03 | Disposition: A | Discharge status: Home / Self Care | Payer: Medicare HMO | Attending: Emergency Medicine | Admitting: Emergency Medicine

## 2016-12-03 DIAGNOSIS — I2510 Atherosclerotic heart disease of native coronary artery without angina pectoris: Secondary | ICD-10-CM | POA: Diagnosis not present

## 2016-12-03 DIAGNOSIS — R091 Pleurisy: Secondary | ICD-10-CM | POA: Diagnosis not present

## 2016-12-03 DIAGNOSIS — R0602 Shortness of breath: Secondary | ICD-10-CM | POA: Diagnosis not present

## 2016-12-03 DIAGNOSIS — I10 Essential (primary) hypertension: Secondary | ICD-10-CM | POA: Insufficient documentation

## 2016-12-03 DIAGNOSIS — I252 Old myocardial infarction: Secondary | ICD-10-CM | POA: Diagnosis not present

## 2016-12-03 DIAGNOSIS — R079 Chest pain, unspecified: Secondary | ICD-10-CM | POA: Diagnosis not present

## 2016-12-03 DIAGNOSIS — Z87891 Personal history of nicotine dependence: Secondary | ICD-10-CM | POA: Diagnosis not present

## 2016-12-03 DIAGNOSIS — R0789 Other chest pain: Secondary | ICD-10-CM | POA: Diagnosis present

## 2016-12-03 HISTORY — DX: Essential (primary) hypertension: I10

## 2016-12-03 LAB — CBC WITH DIFFERENTIAL/PLATELET
Basophils Absolute: 0 10*3/uL (ref 0.0–0.1)
Basophils Relative: 0 %
Eosinophils Absolute: 0.1 10*3/uL (ref 0.0–0.7)
Eosinophils Relative: 1 %
HEMATOCRIT: 49.3 % (ref 39.0–52.0)
Hemoglobin: 17 g/dL (ref 13.0–17.0)
LYMPHS PCT: 16 %
Lymphs Abs: 1.8 10*3/uL (ref 0.7–4.0)
MCH: 32.9 pg (ref 26.0–34.0)
MCHC: 34.5 g/dL (ref 30.0–36.0)
MCV: 95.4 fL (ref 78.0–100.0)
MONO ABS: 0.9 10*3/uL (ref 0.1–1.0)
MONOS PCT: 8 %
NEUTROS ABS: 8.1 10*3/uL — AB (ref 1.7–7.7)
Neutrophils Relative %: 75 %
Platelets: 133 10*3/uL — ABNORMAL LOW (ref 150–400)
RBC: 5.17 MIL/uL (ref 4.22–5.81)
RDW: 14.6 % (ref 11.5–15.5)
WBC: 10.9 10*3/uL — ABNORMAL HIGH (ref 4.0–10.5)

## 2016-12-03 LAB — COMPREHENSIVE METABOLIC PANEL
ALT: 16 U/L — ABNORMAL LOW (ref 17–63)
ANION GAP: 7 (ref 5–15)
AST: 22 U/L (ref 15–41)
Albumin: 4.3 g/dL (ref 3.5–5.0)
Alkaline Phosphatase: 75 U/L (ref 38–126)
BILIRUBIN TOTAL: 2.3 mg/dL — AB (ref 0.3–1.2)
BUN: 17 mg/dL (ref 6–20)
CO2: 28 mmol/L (ref 22–32)
Calcium: 9.3 mg/dL (ref 8.9–10.3)
Chloride: 103 mmol/L (ref 101–111)
Creatinine, Ser: 0.97 mg/dL (ref 0.61–1.24)
GFR calc Af Amer: >60 mL/min (ref >60)
Glucose, Bld: 122 mg/dL — ABNORMAL HIGH (ref 65–99)
POTASSIUM: 4.3 mmol/L (ref 3.5–5.1)
Sodium: 138 mmol/L (ref 135–145)
TOTAL PROTEIN: 7.4 g/dL (ref 6.5–8.1)

## 2016-12-03 LAB — D-DIMER, QUANTITATIVE: D-Dimer, Quant: 0.7 ug/mL-FEU — ABNORMAL HIGH (ref 0.00–0.50)

## 2016-12-03 LAB — TROPONIN I

## 2016-12-03 MED ORDER — TRAMADOL HCL 50 MG PO TABS: 50.0000 mg | ORAL_TABLET | Freq: Four times a day (QID) | ORAL | 0 refills | Status: DC | PRN

## 2016-12-03 MED ORDER — MORPHINE SULFATE (PF) 4 MG/ML IV SOLN
4.0000 mg | Freq: Once | INTRAVENOUS | Status: DC
Start: 2016-12-03 — End: 2016-12-03
  Filled 2016-12-03: qty 1

## 2016-12-03 MED ORDER — IOPAMIDOL (ISOVUE-370) INJECTION 76%
100.0000 mL | Freq: Once | INTRAVENOUS | Status: AC | PRN
  Administered 2016-12-03: 100 mL via INTRAVENOUS

## 2016-12-03 NOTE — Discharge Instructions (Signed)
Follow-up with your doctor this week to recheck your blood pressure and chest discomfort

## 2016-12-03 NOTE — ED Notes (Signed)
Patient transported to X-ray 

## 2016-12-03 NOTE — ED Notes (Signed)
Went in pt's room, pt did not have an IV access.  Pt refused Morphine-stated that he was "not in that much pain. "  Pt did not want IV as well. EDP notified of above.  edp ordered IV.  Went back in pt's room and explained to pt and family for need of IV in case of emergency and that pt is here for CP with hx of MI.  Pt agreed to IV but still refused Morphine.  IV placed.

## 2016-12-03 NOTE — ED Notes (Signed)
Addressed pt's bp with MD.  Stated to call pcp Monday for adjustment of medications.

## 2016-12-03 NOTE — ED Notes (Signed)
Pt reports left chest pain that is worse with rotation of the left shoulder and deep breaths.

## 2016-12-03 NOTE — ED Triage Notes (Signed)
Pt reports beginning to have chest pain with shortness of breath at 0400.  Bp was high at home and pt took his meds.

## 2016-12-03 NOTE — ED Provider Notes (Signed)
[facility] DEPT Provider Note   CSN: [id number] Arrival date & time: 12/03/16  1001  By signing my name below, I, Hilbert Odor, attest that this documentation has been prepared under the direction and in the presence of Milton Ferguson, MD. Electronically Signed: Hilbert Odor, Scribe. 12/03/16. 10:47 AM. History   Chief Complaint Chief Complaint  Patient presents with  . Chest Pain    HPI Comments: Larry [name] is a 76 y.o. male who presents to the Emergency Department complaining of left sided chest pain underneath is arm that began around 0400 today. He also reports associated SOB at that time. The patient reports increased pain with deep breaths. The patient states that before going to bed last night he thought that he may have pulled a muscle due to twisting the wrong way. He states that there was more chest discomfort this morning after he woke up around 0400. He checked his BP at that time and it was noted to be 167/103. He then took his BP medication. He states that he was unable to go back to sleep. He denies any pain with rest. He reports seeing his cardiologist 2 days ago in which an EKG was performed at that time. The EKG noted that he had an abnormal heart rhythm.. The patient was placed on Xarelto at that time.  The history is provided by the patient. No language interpreter was used.  Chest Pain   This is a new problem. The current episode started 6 to 12 hours ago. The problem occurs constantly. The problem has been gradually worsening. The pain is at a severity of 5/10. The pain is severe. Duration of episode(s) is 7 hours. Associated symptoms include shortness of breath. Pertinent negatives include no abdominal pain, no back pain, no cough and no headaches.  His past medical history is significant for CAD and hypertension.  Pertinent negatives for past medical history include no seizures.    Past Medical History:  Diagnosis Date  . Coronary artery disease   .  Hypercholesterolemia   . Hypertension   . Myocardial infarct   . Olecranon bursitis     Patient Active Problem List   Diagnosis Date Noted  . Atrial flutter ([place]) 11/11/2012  . HYPERCHOLESTEROLEMIA 11/04/2009  . Coronary atherosclerosis 11/04/2009    Past Surgical History:  Procedure Laterality Date  . CARDIAC CATHETERIZATION  12/15/1999   EF was 55%   . CORONARY ANGIOPLASTY WITH STENT PLACEMENT  08/15/1999   CAD, status post prior stenting of the mid to distal RCA in 11/1998, with an acute diaphragmatic wall infarction due to total occlusion at the stent site/There is also 70% narrowing in the diagonal branch of the LAD/The LV showed inferior wall hypo- akinesis.-- Successful reperfusion, percutaneous transluminal coronary percutaneous transluminal coronary & placement of a 2nd overlying stent in RCA       Home Medications    Prior to Admission medications   Medication Sig Start Date End Date Taking? Authorizing Provider  aspirin 81 MG tablet Take 81 mg by mouth daily.    Historical Provider, MD  metoprolol (LOPRESSOR) 50 MG tablet TAKE ONE AND ONE-HALF TABLET BY MOUTH TWO TIMES DAILY 07/31/16   Josue Hector, MD  nitroGLYCERIN (NITROSTAT) 0.4 MG SL tablet Place 0.4 mg under the tongue every 5 (five) minutes as needed for chest pain (3 doses max).    Historical Provider, MD  pravastatin (PRAVACHOL) 40 MG tablet TAKE ONE TABLET BY MOUTH IN THE EVENING. 09/27/16   Josue Hector,  MD  rivaroxaban (XARELTO) 20 MG TABS tablet Take 1 tablet (20 mg total) by mouth daily with supper. 12/01/16   Josue Hector, MD  rivaroxaban (XARELTO) 20 MG TABS tablet Take 1 tablet (20 mg total) by mouth daily with supper. 12/01/16   Josue Hector, MD    Family History Family History  Problem Relation Age of Onset  . Heart Problems Mother   . Cancer Father     Social History Social History  Substance Use Topics  . Smoking status: Former Smoker    Types: Cigarettes    Quit date: 11/02/1999    . Smokeless tobacco: Never Used  . Alcohol use No     Allergies   Plavix [clopidogrel bisulfate]   Review of Systems Review of Systems  Constitutional: Negative for appetite change and fatigue.  HENT: Negative for congestion, ear discharge and sinus pressure.   Eyes: Negative for discharge.  Respiratory: Positive for shortness of breath. Negative for cough.   Cardiovascular: Positive for chest pain.  Gastrointestinal: Negative for abdominal pain and diarrhea.  Genitourinary: Negative for frequency and hematuria.  Musculoskeletal: Negative for back pain.  Skin: Negative for rash.  Neurological: Negative for seizures and headaches.  Psychiatric/Behavioral: Negative for hallucinations.  All other systems reviewed and are negative.    Physical Exam Updated Vital Signs BP (!) 142/110 (BP Location: Left Arm)   Pulse 99   Temp 97.7 F (36.5 C) (Oral)   Resp 24   Ht 6' (1.829 m)   Wt 200 lb (90.7 kg)   SpO2 94%   BMI 27.12 kg/m   Physical Exam  Constitutional: He is oriented to person, place, and time. He appears well-developed.  HENT:  Head: Normocephalic.  Eyes: Conjunctivae and EOM are normal. No scleral icterus.  Neck: Neck supple. No thyromegaly present.  Cardiovascular: Normal rate and regular rhythm.  Exam reveals no gallop and no friction rub.   No murmur heard. Pulmonary/Chest: No stridor. He has no wheezes. He has no rales. He exhibits no tenderness.  Abdominal: He exhibits no distension. There is no tenderness. There is no rebound.  Musculoskeletal: Normal range of motion. He exhibits no edema.  Lymphadenopathy:    He has no cervical adenopathy.  Neurological: He is oriented to person, place, and time. He exhibits normal muscle tone. Coordination normal.  Skin: No rash noted. No erythema.  Psychiatric: He has a normal mood and affect. His behavior is normal.  Nursing note and vitals reviewed.    ED Treatments / Results  DIAGNOSTIC STUDIES: Oxygen  Saturation is 94% on RA, normal by my interpretation.    COORDINATION OF CARE: 10:40 AM Discussed treatment plan with pt at bedside and pt agreed to plan. I discussed the patient EKG with him. I will check the patient's CXR.  Labs (all labs ordered are listed, but only abnormal results are displayed) Labs Reviewed  CBC WITH DIFFERENTIAL/PLATELET  COMPREHENSIVE METABOLIC PANEL  TROPONIN I  D-DIMER, QUANTITATIVE (NOT AT [facility])    EKG  EKG Interpretation None       Radiology No results found.  Procedures Procedures (including critical care time)  Medications Ordered in ED Medications  morphine 4 MG/ML injection 4 mg (not administered)     Initial Impression / Assessment and Plan / ED Course  I have reviewed the triage vital signs and the nursing notes.  Pertinent labs & imaging results that were available during my care of the patient were reviewed by me and considered in my  medical decision making (see chart for details).     Patient with pleuritic left-sided chest pain. CT angiogram negative. Patient diagnosed with pleuritis and is given Ultram to take if Tylenol does not help he is to follow-up with his doctor this week  Final Clinical Impressions(s) / ED Diagnoses   Final diagnoses:  None    New Prescriptions New Prescriptions   No medications on file   The chart was scribed for me under my direct supervision.  I personally performed the history, physical, and medical decision making and all procedures in the evaluation of this patient.Milton Ferguson, MD 12/03/16 [phone]

## 2016-12-05 ENCOUNTER — Telehealth: Payer: Self-pay | Admitting: Cardiovascular Disease

## 2016-12-05 ENCOUNTER — Telehealth: Payer: Self-pay

## 2016-12-05 MED ORDER — LOSARTAN POTASSIUM 50 MG PO TABS: 50.0000 mg | ORAL_TABLET | Freq: Every day | ORAL | 3 refills | Status: DC

## 2016-12-05 NOTE — Telephone Encounter (Signed)
Called patient about lab results. Patient is concerned about his BP. SBP has been in the 150's and DBP has been as high as 104. Patient stated that ever since he started xarelto that his BP has been running high in the evenings. Consulted pharmacist about side effects of xarelto, and Jinny Blossom stated xarelto should not cause BP to increase. Patient stated he went to ED over the weekend due to his BP. Patient's chart shows patient went to ED for chest pain. Will forward to Dr. Johnsie Cancel for advisement.

## 2016-12-05 NOTE — Telephone Encounter (Signed)
Start cozaar 50 mg take at lunch in between lopressor doses

## 2016-12-05 NOTE — Telephone Encounter (Signed)
-----   Message from Josue Hector, MD sent at 12/04/2016  8:14 AM EST ----- Labs ok except PLT;s a little low f/u with primary for this

## 2016-12-05 NOTE — Telephone Encounter (Signed)
Patient is returning call. Thanks. °

## 2016-12-05 NOTE — Telephone Encounter (Signed)
Patient aware of lab results.

## 2016-12-05 NOTE — Telephone Encounter (Signed)
Larry [name] is returning your call from yesterday . Please call .Marland Kitchen  Thanks

## 2016-12-05 NOTE — Telephone Encounter (Signed)
Called patient about Dr. Kyla Balzarine recommendations. Encouraged patient to keep track of his BP's to see if this medication helps. Informed patient if it does not help with his BP to give our office a call. Patient verbalized understanding.

## 2016-12-08 SURGERY — Surgical Case
Anesthesia: *Unknown

## 2016-12-11 ENCOUNTER — Telehealth: Payer: Self-pay

## 2016-12-11 ENCOUNTER — Telehealth: Payer: Self-pay | Admitting: Cardiovascular Disease

## 2016-12-11 ENCOUNTER — Ambulatory Visit (INDEPENDENT_AMBULATORY_CARE_PROVIDER_SITE_OTHER): Payer: Medicare HMO

## 2016-12-11 DIAGNOSIS — I4892 Unspecified atrial flutter: Secondary | ICD-10-CM

## 2016-12-11 MED ORDER — RIVAROXABAN 20 MG PO TABS: 20.0000 mg | ORAL_TABLET | Freq: Every day | ORAL | 3 refills | Status: DC

## 2016-12-11 NOTE — Telephone Encounter (Signed)
Larry [name] with [facility] reported patient initial reading on his monitor as having A. Flutter with HR in the 70's and 80's. This is the same as when patient was at his OV on 12/01/16.

## 2016-12-11 NOTE — Telephone Encounter (Signed)
Patient needed printed prescription for xarelto to use discount card.

## 2016-12-11 NOTE — Telephone Encounter (Signed)
New message    Abnormal EKG for pt -[facility]

## 2016-12-11 NOTE — Telephone Encounter (Signed)
Just want to know if he is in fib/flutter all the time

## 2016-12-29 ENCOUNTER — Telehealth: Payer: Self-pay | Admitting: Cardiovascular Disease

## 2016-12-29 NOTE — Telephone Encounter (Signed)
Patient stated he cannot afford his xarelto. Patient has an appointment on Monday with Roderic Palau NP to see if he is still in A. FIB and discuss cardioversion. Patient stated he took his last pill last night. Informed patient that he needs to stay on xarelto for 4 weeks to have cardioversion. Will send message to refills to give patient at least one week of samples for now. Patient agreed to plan.

## 2016-12-29 NOTE — Telephone Encounter (Signed)
1 bottle of Xarelto was placed up front at the front desk for pt to pick up. Pt is aware of this.

## 2016-12-29 NOTE — Telephone Encounter (Signed)
Larry [name] is calling in reference to his Xarelto . He has some questions he wants ask you about it . Please call

## 2017-01-01 ENCOUNTER — Ambulatory Visit (HOSPITAL_COMMUNITY)
Admission: RE | Admit: 2017-01-01 | Discharge: 2017-01-01 | Disposition: A | Discharge status: Home / Self Care | Payer: Medicare HMO | Source: Ambulatory Visit | Attending: Nurse Practitioner | Admitting: Nurse Practitioner

## 2017-01-01 ENCOUNTER — Encounter (HOSPITAL_COMMUNITY): Payer: Self-pay | Admitting: Nurse Practitioner

## 2017-01-01 VITALS — BP 154/92 | HR 80 | Ht 72.0 in | Wt 202.0 lb

## 2017-01-01 DIAGNOSIS — Z888 Allergy status to other drugs, medicaments and biological substances status: Secondary | ICD-10-CM | POA: Insufficient documentation

## 2017-01-01 DIAGNOSIS — I252 Old myocardial infarction: Secondary | ICD-10-CM | POA: Diagnosis not present

## 2017-01-01 DIAGNOSIS — Z7901 Long term (current) use of anticoagulants: Secondary | ICD-10-CM | POA: Diagnosis not present

## 2017-01-01 DIAGNOSIS — I4891 Unspecified atrial fibrillation: Secondary | ICD-10-CM | POA: Diagnosis not present

## 2017-01-01 DIAGNOSIS — Z87891 Personal history of nicotine dependence: Secondary | ICD-10-CM | POA: Diagnosis not present

## 2017-01-01 DIAGNOSIS — Z79899 Other long term (current) drug therapy: Secondary | ICD-10-CM | POA: Diagnosis not present

## 2017-01-01 DIAGNOSIS — Z809 Family history of malignant neoplasm, unspecified: Secondary | ICD-10-CM | POA: Diagnosis not present

## 2017-01-01 DIAGNOSIS — I10 Essential (primary) hypertension: Secondary | ICD-10-CM | POA: Diagnosis not present

## 2017-01-01 DIAGNOSIS — Z8249 Family history of ischemic heart disease and other diseases of the circulatory system: Secondary | ICD-10-CM | POA: Insufficient documentation

## 2017-01-01 DIAGNOSIS — I2510 Atherosclerotic heart disease of native coronary artery without angina pectoris: Secondary | ICD-10-CM | POA: Insufficient documentation

## 2017-01-01 DIAGNOSIS — E785 Hyperlipidemia, unspecified: Secondary | ICD-10-CM | POA: Insufficient documentation

## 2017-01-01 DIAGNOSIS — Z955 Presence of coronary angioplasty implant and graft: Secondary | ICD-10-CM | POA: Diagnosis not present

## 2017-01-01 DIAGNOSIS — E7800 Pure hypercholesterolemia, unspecified: Secondary | ICD-10-CM | POA: Diagnosis not present

## 2017-01-01 DIAGNOSIS — Z7982 Long term (current) use of aspirin: Secondary | ICD-10-CM | POA: Diagnosis not present

## 2017-01-02 ENCOUNTER — Telehealth: Payer: Self-pay

## 2017-01-02 NOTE — Progress Notes (Signed)
Primary Care Physician: No PCP Per Patient Referring Physician: Dr. Johnsie Cancel Cardiologist: Dr. Kriste Basque Larry [name] is a 76 y.o. male with a h/o hyperlipidemia, CAD, multiple stents of the RCA in 2000/200, that presented to his routine office visit with Dr. Johnsie Cancel, 12/01/16 and asymptomatic flutter was found on EKG. A monitor was placed and he was started on xarelto 20 mg for a chadsvasc score of at least 3. He just turned in his monitor thia am. He is taking xarelto without missed doses.   He continues to be asymptomatic, he feels no different. He is very active and has seen no decline in his exercise tolerance or his breathing. His EKG shows afib at 80 bpm. He denies any tobacco, has OSA and wears CPAP. No significant alcohol use. He is not sure if he is staying in afib/flutter or if it is paroxysmal. His wife and the pt are very conservative with medications and procedures.  Today, he denies symptoms of palpitations, chest pain, shortness of breath, orthopnea, PND, lower extremity edema, dizziness, presyncope, syncope, or neurologic sequela. The patient is tolerating medications without difficulties and is otherwise without complaint today.   Past Medical History:  Diagnosis Date  . Coronary artery disease   . Hypercholesterolemia   . Hypertension   . Myocardial infarct   . Olecranon bursitis    Past Surgical History:  Procedure Laterality Date  . CARDIAC CATHETERIZATION  12/15/1999   EF was 55%   . CORONARY ANGIOPLASTY WITH STENT PLACEMENT  08/15/1999   CAD, status post prior stenting of the mid to distal RCA in 11/1998, with an acute diaphragmatic wall infarction due to total occlusion at the stent site/There is also 70% narrowing in the diagonal branch of the LAD/The LV showed inferior wall hypo- akinesis.-- Successful reperfusion, percutaneous transluminal coronary percutaneous transluminal coronary & placement of a 2nd overlying stent in RCA    Current Outpatient Prescriptions    Medication Sig Dispense Refill  . aspirin 81 MG tablet Take 81 mg by mouth daily.    Marland Kitchen losartan (COZAAR) 50 MG tablet Take 1 tablet (50 mg total) by mouth daily. 90 tablet 3  . metoprolol (LOPRESSOR) 50 MG tablet TAKE ONE AND ONE-HALF TABLET BY MOUTH TWO TIMES DAILY 90 tablet 8  . nitroGLYCERIN (NITROSTAT) 0.4 MG SL tablet Place 0.4 mg under the tongue every 5 (five) minutes as needed for chest pain (3 doses max).    . pravastatin (PRAVACHOL) 40 MG tablet TAKE ONE TABLET BY MOUTH IN THE EVENING. 30 tablet 6  . rivaroxaban (XARELTO) 20 MG TABS tablet Take 1 tablet (20 mg total) by mouth daily with supper. 90 tablet 3  . traMADol (ULTRAM) 50 MG tablet Take 1 tablet (50 mg total) by mouth every 6 (six) hours as needed. 20 tablet 0   No current facility-administered medications for this encounter.     Allergies  Allergen Reactions  . Plavix [Clopidogrel Bisulfate]     Unknown reaction     Social History   Social History  . Marital status: Married    Spouse name: scarlett  . Number of children: 3  . Years of education: 12   Occupational History  . retired    Social History Main Topics  . Smoking status: Former Smoker    Types: Cigarettes    Quit date: 11/02/1999  . Smokeless tobacco: Never Used  . Alcohol use No  . Drug use: No  . Sexual activity: Not on file   Other  Topics Concern  . Not on file   Social History Narrative  . No narrative on file    Family History  Problem Relation Age of Onset  . Heart Problems Mother   . Cancer Father     ROS- All systems are reviewed and negative except as per the HPI above  Physical Exam: Vitals:   01/01/17 1423  BP: (!) 154/92  Pulse: 80  Weight: 202 lb (91.6 kg)  Height: 6' (1.829 m)   Wt Readings from Last 3 Encounters:  01/01/17 202 lb (91.6 kg)  12/03/16 200 lb (90.7 kg)  12/01/16 200 lb 12.8 oz (91.1 kg)    Labs: Lab Results  Component Value Date   NA 138 12/03/2016   K 4.3 12/03/2016   CL 103 12/03/2016    CO2 28 12/03/2016   GLUCOSE 122 (H) 12/03/2016   BUN 17 12/03/2016   CREATININE 0.97 12/03/2016   CALCIUM 9.3 12/03/2016   No results found for: INR Lab Results  Component Value Date   CHOL 139 04/12/2016   HDL 37 (L) 04/12/2016   LDLCALC 77 04/12/2016   TRIG 127 04/12/2016     GEN- The patient is well appearing, alert and oriented x 3 today.   Head- normocephalic, atraumatic Eyes-  Sclera clear, conjunctiva pink Ears- hearing intact Oropharynx- clear Neck- supple, no JVP Lymph- no cervical lymphadenopathy Lungs- Clear to ausculation bilaterally, normal work of breathing Heart- irregular rate and rhythm, no murmurs, rubs or gallops, PMI not laterally displaced GI- soft, NT, ND, + BS Extremities- no clubbing, cyanosis, or edema MS- no significant deformity or atrophy Skin- no rash or lesion Psych- euthymic mood, full affect Neuro- strength and sensation are intact  EKG-afib, rate controlled at 80 bpm, qrs int 96 ms, qtc 465 ms Epic records reviewed Echo-Study Conclusions  - Left ventricle: Septal and inferobasal wall hypokinesis The cavity size was mildly dilated. Wall thickness was increased in a pattern of mild LVH. The estimated ejection fraction was 50%. - Left atrium: The atrium was moderately dilated. - Right atrium: The atrium was mildly dilated. - Atrial septum: Redundant septum mobile Cannot r/o PFO - Pericardium, extracardiac: A trivial pericardial effusion was identified. Transthoracic echocardiography. M-mode, complete 2D, spectral Doppler, and color Doppler. Height: Height: 180.3cm. Height: 71in. Weight: Weight: 90.3kg. Weight: 198.6lb. Body mass index: BMI: 27.8kg/m^2. Body surface area:  BSA: 2.24m^2. Blood pressure:   130/72. Patient status: Outpatient. Location: [facility]    Assessment and Plan: 1. Afib/flutter Pt just turned in 30 day monitor this am and results are not available for this appointment Pt is  doubting he is in irregular rhythm all the time Treatment options discussed He is not an ablation candidate due to not taking/failing an antiarrythmic yet and he is asymptomatic He does not want an anitarrythmic He is rate controlled at 80 bpm and he will continue metoprolol 50 mg bid He will continue xarelto 20 mg daily with a CHA2DS2VASc score of at least 3 He and his wife had many questions re bleeding and use of DOAC and I answered to their satisfaction, he should stay on drug indefinitely Bleeding precautions discussed They also want to know if they need to stay on baby asa and I will defer to Dr. Johnsie Cancel with his history of CAD He feels well and does not want to undergo a cardioversion at this time Pt states that if monitor shows that he is in afib all the time, he may reconsider, but he  rather not treat the arrhythmia by DCCV if he is not feeling bad from it They have friends that have lived in [place] x 20 years and have done well  F/u with Dr. Johnsie Cancel 6/1 He will let me know if he changes his mind when results of monitor are known, if he does want to pursue [place]. Aubrynn Katona, [facility] [address] [place], [place] [address] [phone]

## 2017-01-02 NOTE — Telephone Encounter (Addendum)
Prior auth for Xarelto 20mg  submitted to [facility]. Fax received from [place] stating a PA is not required on Xarelto.

## 2017-01-09 ENCOUNTER — Telehealth: Payer: Self-pay | Admitting: Cardiovascular Disease

## 2017-01-09 NOTE — Telephone Encounter (Signed)
Follow Up:    Pt left a different phone number for you.

## 2017-01-09 NOTE — Telephone Encounter (Signed)
UNABLE TO  LEAVE  MESSAGE  MAIL BOX HAS NOT BEEN SET  UP YET /CY

## 2017-01-09 NOTE — Telephone Encounter (Signed)
PT'S WIFE  AWARE  THAT PT NEEDS  TO TAKE  ASA 81 MG FOR  CAD   AND  XARELTO FOR  AFIB./CY

## 2017-01-09 NOTE — Telephone Encounter (Signed)
LMTCB ./CY 

## 2017-01-09 NOTE — Telephone Encounter (Signed)
Follow Up:   Returning Pam's call from just a few minutes ago.

## 2017-01-29 ENCOUNTER — Other Ambulatory Visit: Payer: Self-pay | Admitting: Cardiovascular Disease

## 2017-01-30 NOTE — Telephone Encounter (Signed)
OV with Dr Johnsie Cancel 12/01/16. Serum creatine 0.97 12/03/16. Age 76 yrs. Creatine clearance 86.04 mL/min. Xarelto 20mg s QD reordered.

## 2017-02-15 ENCOUNTER — Encounter: Payer: Self-pay | Admitting: Cardiovascular Disease
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
## 2017-03-09 ENCOUNTER — Ambulatory Visit (INDEPENDENT_AMBULATORY_CARE_PROVIDER_SITE_OTHER): Payer: Medicare HMO | Admitting: Cardiovascular Disease

## 2017-03-09 ENCOUNTER — Encounter (INDEPENDENT_AMBULATORY_CARE_PROVIDER_SITE_OTHER): Payer: Self-pay

## 2017-03-09 ENCOUNTER — Encounter: Payer: Self-pay | Admitting: Cardiovascular Disease

## 2017-03-09 VITALS — BP 116/70 | HR 100 | Ht 72.0 in | Wt 202.1 lb

## 2017-03-09 DIAGNOSIS — I4891 Unspecified atrial fibrillation: Secondary | ICD-10-CM

## 2017-03-09 DIAGNOSIS — I2510 Atherosclerotic heart disease of native coronary artery without angina pectoris: Secondary | ICD-10-CM | POA: Diagnosis not present

## 2017-03-09 DIAGNOSIS — I2583 Coronary atherosclerosis due to lipid rich plaque: Secondary | ICD-10-CM

## 2017-03-09 DIAGNOSIS — I4892 Unspecified atrial flutter: Secondary | ICD-10-CM

## 2017-03-09 MED ORDER — RIVAROXABAN 20 MG PO TABS: 20.0000 mg | ORAL_TABLET | Freq: Every day | ORAL | 3 refills | Status: DC

## 2017-03-09 MED ORDER — PRAVASTATIN SODIUM 40 MG PO TABS: 40.0000 mg | ORAL_TABLET | Freq: Every evening | ORAL | 3 refills | Status: DC

## 2017-03-09 MED ORDER — METOPROLOL TARTRATE 50 MG PO TABS: ORAL_TABLET | ORAL | 3 refills | Status: DC

## 2017-03-09 NOTE — Patient Instructions (Addendum)
Medication Instructions:  Hold your Xarelto for 1 week.  Labwork: NONE  Testing/Procedures: NONE  Follow-Up: Your physician wants you to follow-up in: Next Available with Dr. Johnsie Cancel. You will receive a reminder letter in the mail two months in advance. If you don't receive a letter, please call our office to schedule the follow-up appointment.   If you need a refill on your cardiac medications before your next appointment, please call your pharmacy.
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
## 2017-04-16 ENCOUNTER — Ambulatory Visit (INDEPENDENT_AMBULATORY_CARE_PROVIDER_SITE_OTHER): Payer: Medicare HMO | Admitting: Cardiovascular Disease

## 2017-04-16 ENCOUNTER — Encounter: Payer: Self-pay | Admitting: Cardiovascular Disease

## 2017-04-16 VITALS — BP 132/80 | HR 86 | Ht 72.0 in | Wt 201.6 lb

## 2017-04-16 DIAGNOSIS — I480 Paroxysmal atrial fibrillation: Secondary | ICD-10-CM

## 2017-04-16 NOTE — Patient Instructions (Signed)

## 2017-05-29 DIAGNOSIS — H40053 Ocular hypertension, bilateral: Secondary | ICD-10-CM | POA: Diagnosis not present

## 2017-09-03 ENCOUNTER — Telehealth: Payer: Self-pay | Admitting: Cardiovascular Disease

## 2017-09-03 DIAGNOSIS — I4892 Unspecified atrial flutter: Secondary | ICD-10-CM
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
## 2017-09-03 NOTE — Telephone Encounter (Signed)
The pt is advised that I am leaving him 2 bottles of Xarelto samples and a pt assistance application in our front office for him to pick up at his convenience. He verbalized understanding.

## 2017-09-19 ENCOUNTER — Telehealth: Payer: Self-pay | Admitting: Cardiovascular Disease

## 2017-09-19 MED ORDER — RIVAROXABAN 20 MG PO TABS: 20.0000 mg | ORAL_TABLET | Freq: Every day | ORAL | 3 refills | Status: DC

## 2017-09-19 NOTE — Telephone Encounter (Signed)
Patient calling the office for samples of medication: ° ° °1.  What medication and dosage are you requesting samples for? Xarelto 20 mg ° °2.  Are you currently out of this medication? yes ° ° °

## 2017-09-19 NOTE — Telephone Encounter (Signed)
The pt is advised that I have left him 3 bottles of Xarelto samples in the front office and that he can pick up at his convenience. He verbalized understanding and thanked me for my help.

## 2017-09-19 NOTE — Telephone Encounter (Signed)
**Note De-Identified Larry [name] Obfuscation** The pt returned his Pt assistance application. I have faxed to [employer].

## 2017-09-27 DIAGNOSIS — I480 Paroxysmal atrial fibrillation: Secondary | ICD-10-CM | POA: Diagnosis not present

## 2017-09-27 DIAGNOSIS — Z6827 Body mass index (BMI) 27.0-27.9, adult: Secondary | ICD-10-CM | POA: Diagnosis not present

## 2017-09-27 DIAGNOSIS — Z125 Encounter for screening for malignant neoplasm of prostate: Secondary | ICD-10-CM | POA: Diagnosis not present

## 2017-09-27 DIAGNOSIS — E782 Mixed hyperlipidemia: Secondary | ICD-10-CM | POA: Diagnosis not present

## 2017-09-27 DIAGNOSIS — I2510 Atherosclerotic heart disease of native coronary artery without angina pectoris: Secondary | ICD-10-CM | POA: Diagnosis not present

## 2017-09-27 DIAGNOSIS — Z0001 Encounter for general adult medical examination with abnormal findings: Secondary | ICD-10-CM | POA: Diagnosis not present

## 2017-09-27 DIAGNOSIS — M25562 Pain in left knee: Secondary | ICD-10-CM | POA: Diagnosis not present

## 2017-09-27 DIAGNOSIS — I4891 Unspecified atrial fibrillation: Secondary | ICD-10-CM | POA: Diagnosis not present

## 2017-09-27 DIAGNOSIS — Z1331 Encounter for screening for depression: Secondary | ICD-10-CM | POA: Diagnosis not present

## 2017-10-03 DIAGNOSIS — E7800 Pure hypercholesterolemia, unspecified: Secondary | ICD-10-CM | POA: Insufficient documentation

## 2017-10-03 DIAGNOSIS — I10 Essential (primary) hypertension: Secondary | ICD-10-CM | POA: Insufficient documentation

## 2017-10-03 DIAGNOSIS — M7020 Olecranon bursitis, unspecified elbow: Secondary | ICD-10-CM | POA: Insufficient documentation

## 2017-10-03 DIAGNOSIS — I219 Acute myocardial infarction, unspecified: Secondary | ICD-10-CM | POA: Insufficient documentation

## 2017-10-03 DIAGNOSIS — I2510 Atherosclerotic heart disease of native coronary artery without angina pectoris: Secondary | ICD-10-CM | POA: Insufficient documentation

## 2017-10-10 ENCOUNTER — Telehealth: Payer: Self-pay

## 2017-10-10 DIAGNOSIS — I4892 Unspecified atrial flutter: Secondary | ICD-10-CM

## 2017-10-10 MED ORDER — RIVAROXABAN 20 MG PO TABS: 20.0000 mg | ORAL_TABLET | Freq: Every day | ORAL | 6 refills | Status: DC

## 2017-10-10 NOTE — Telephone Encounter (Signed)
New Message: ° ° °Please call,needs to give you some information. °

## 2017-10-10 NOTE — Telephone Encounter (Signed)
**Note De-Identified Govanni Plemons Obfuscation** The pt is requesting a refill on his Xarelto. He is advised that I have sent his refill in for a 30 day supply. He thanked me for my assistance.
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
## 2017-10-19 ENCOUNTER — Encounter: Payer: Self-pay | Admitting: Cardiovascular Disease

## 2017-10-19 ENCOUNTER — Ambulatory Visit: Payer: Medicare HMO | Admitting: Cardiovascular Disease

## 2017-10-19 VITALS — BP 132/88 | HR 82 | Ht 72.0 in | Wt 202.5 lb

## 2017-10-19 DIAGNOSIS — I2510 Atherosclerotic heart disease of native coronary artery without angina pectoris: Secondary | ICD-10-CM

## 2017-10-19 DIAGNOSIS — E7800 Pure hypercholesterolemia, unspecified: Secondary | ICD-10-CM | POA: Diagnosis not present

## 2017-10-19 DIAGNOSIS — I10 Essential (primary) hypertension: Secondary | ICD-10-CM | POA: Diagnosis not present

## 2017-10-19 DIAGNOSIS — I2583 Coronary atherosclerosis due to lipid rich plaque: Secondary | ICD-10-CM

## 2017-10-19 DIAGNOSIS — I480 Paroxysmal atrial fibrillation: Secondary | ICD-10-CM

## 2017-10-19 NOTE — Patient Instructions (Signed)

## 2017-12-21 DIAGNOSIS — Z0001 Encounter for general adult medical examination with abnormal findings: Secondary | ICD-10-CM | POA: Diagnosis not present

## 2017-12-21 DIAGNOSIS — Z1331 Encounter for screening for depression: Secondary | ICD-10-CM | POA: Diagnosis not present

## 2017-12-21 DIAGNOSIS — I480 Paroxysmal atrial fibrillation: Secondary | ICD-10-CM | POA: Diagnosis not present

## 2017-12-21 DIAGNOSIS — I10 Essential (primary) hypertension: Secondary | ICD-10-CM | POA: Diagnosis not present

## 2017-12-21 DIAGNOSIS — M25562 Pain in left knee: Secondary | ICD-10-CM | POA: Diagnosis not present

## 2017-12-21 DIAGNOSIS — Z6827 Body mass index (BMI) 27.0-27.9, adult: Secondary | ICD-10-CM | POA: Diagnosis not present

## 2017-12-21 DIAGNOSIS — E782 Mixed hyperlipidemia: Secondary | ICD-10-CM | POA: Diagnosis not present

## 2017-12-21 DIAGNOSIS — I2510 Atherosclerotic heart disease of native coronary artery without angina pectoris: Secondary | ICD-10-CM | POA: Diagnosis not present

## 2017-12-24 ENCOUNTER — Other Ambulatory Visit: Payer: Self-pay | Admitting: Cardiovascular Disease

## 2017-12-24 DIAGNOSIS — I4891 Unspecified atrial fibrillation: Secondary | ICD-10-CM

## 2017-12-24 MED ORDER — PRAVASTATIN SODIUM 40 MG PO TABS: 40.0000 mg | ORAL_TABLET | Freq: Every evening | ORAL | 2 refills | Status: DC

## 2017-12-24 MED ORDER — METOPROLOL TARTRATE 50 MG PO TABS: ORAL_TABLET | ORAL | 2 refills | Status: DC

## 2017-12-24 NOTE — Telephone Encounter (Signed)
Pt's medication was sent to pt's pharmacy as requested. Confirmation received.  °

## 2017-12-28 ENCOUNTER — Telehealth: Payer: Self-pay | Admitting: Cardiovascular Disease
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
## 2018-01-04 DIAGNOSIS — G4733 Obstructive sleep apnea (adult) (pediatric): Secondary | ICD-10-CM | POA: Diagnosis not present

## 2018-01-14 DIAGNOSIS — G4733 Obstructive sleep apnea (adult) (pediatric): Secondary | ICD-10-CM | POA: Diagnosis not present

## 2018-01-15 ENCOUNTER — Telehealth: Payer: Self-pay | Admitting: Cardiovascular Disease

## 2018-01-15 NOTE — Telephone Encounter (Signed)
Closed Encounter  °

## 2018-01-29 ENCOUNTER — Telehealth: Payer: Self-pay | Admitting: Cardiovascular Disease

## 2018-01-29 NOTE — Telephone Encounter (Signed)
New message  Patient calling with questions about HR. No chest pain, No shortness of breath  STAT if HR is under 50 or over 120 (normal HR is 60-100 beats per minute)  1) What is your heart rate? HR 92, BP 126/80  2) Do you have a log of your heart rate readings (document readings)? 96,86, 96,89,  3) Do you have any other symptoms? No

## 2018-01-29 NOTE — Telephone Encounter (Signed)
Left message for patient to call back  

## 2018-01-30 NOTE — Telephone Encounter (Signed)
I returned call to patient. He states that his resting HR has been sustaining in the 80s and increases to 90-100s with activity. Once he sits down resting HR drops to the 80s. Patient is asymptomatic. I informed patient that HR fluctuates with afib and explained that he should continue to monitor and if resting HR maintains above 120 to call office. Patient verbalized understanding and thankful for the call.

## 2018-01-30 NOTE — Telephone Encounter (Signed)
Follow Up: ° ° ° °Returning your call from yesterday. °

## 2018-02-08 ENCOUNTER — Telehealth: Payer: Self-pay | Admitting: Cardiovascular Disease

## 2018-02-08 NOTE — Telephone Encounter (Signed)
Called patient back about his message. Patient complaining of elevated BP and HR in the evenings. Patient stated he is taking his metoprolol 75 mg twice daily and xarelto. Patient's PCP put patient back on losartan 50 mg daily. Patient stated almost every evening his SBP is up 140's, DBP is up 90 to 100, and his HR is 110 to 120. Patient stated he will turn off the TV and try to relax to get his HR and BP back down. Patient stated this usually works, but he feels so tired, and he knows this is probably due to his A. FIB. Made patient an appointment with Dr. Johnsie Cancel in [place] at the end of May. Encouraged patient to discuss cardioversion at that appointment. Will forward to Dr. Johnsie Cancel for further advisement in the mean time.

## 2018-02-08 NOTE — Telephone Encounter (Signed)
New Message:       Pt is needing an appt before his recall date due to having some bp issues. Pt states his bp is fine in the morning but around 9 pm. Nishan's first available is 05/09/18

## 2018-02-12 DIAGNOSIS — M25562 Pain in left knee: Secondary | ICD-10-CM | POA: Diagnosis not present

## 2018-02-12 DIAGNOSIS — Z0001 Encounter for general adult medical examination with abnormal findings: Secondary | ICD-10-CM | POA: Diagnosis not present

## 2018-02-12 DIAGNOSIS — I2510 Atherosclerotic heart disease of native coronary artery without angina pectoris: Secondary | ICD-10-CM | POA: Diagnosis not present

## 2018-02-12 DIAGNOSIS — E782 Mixed hyperlipidemia: Secondary | ICD-10-CM | POA: Diagnosis not present

## 2018-02-12 DIAGNOSIS — Z6827 Body mass index (BMI) 27.0-27.9, adult: Secondary | ICD-10-CM | POA: Diagnosis not present

## 2018-02-12 DIAGNOSIS — Z1331 Encounter for screening for depression: Secondary | ICD-10-CM | POA: Diagnosis not present

## 2018-02-12 DIAGNOSIS — I10 Essential (primary) hypertension: Secondary | ICD-10-CM | POA: Diagnosis not present

## 2018-02-12 DIAGNOSIS — I480 Paroxysmal atrial fibrillation: Secondary | ICD-10-CM | POA: Diagnosis not present

## 2018-02-12 DIAGNOSIS — J060 Acute laryngopharyngitis: Secondary | ICD-10-CM | POA: Diagnosis not present

## 2018-02-19 ENCOUNTER — Encounter: Payer: Self-pay | Admitting: Physician Assistant

## 2018-02-19 ENCOUNTER — Encounter (INDEPENDENT_AMBULATORY_CARE_PROVIDER_SITE_OTHER): Payer: Self-pay

## 2018-02-19 ENCOUNTER — Ambulatory Visit: Payer: Medicare HMO | Admitting: Physician Assistant

## 2018-02-19 VITALS — BP 128/80 | HR 84 | Ht 73.0 in | Wt 206.0 lb

## 2018-02-19 DIAGNOSIS — I10 Essential (primary) hypertension: Secondary | ICD-10-CM

## 2018-02-19 DIAGNOSIS — M7989 Other specified soft tissue disorders: Secondary | ICD-10-CM

## 2018-02-19 DIAGNOSIS — I4891 Unspecified atrial fibrillation: Secondary | ICD-10-CM

## 2018-02-19 DIAGNOSIS — R0602 Shortness of breath: Secondary | ICD-10-CM

## 2018-02-19 DIAGNOSIS — I2510 Atherosclerotic heart disease of native coronary artery without angina pectoris: Secondary | ICD-10-CM

## 2018-02-19 DIAGNOSIS — I509 Heart failure, unspecified: Secondary | ICD-10-CM | POA: Insufficient documentation

## 2018-02-19 DIAGNOSIS — E7800 Pure hypercholesterolemia, unspecified: Secondary | ICD-10-CM | POA: Diagnosis not present

## 2018-02-19 DIAGNOSIS — I5021 Acute systolic (congestive) heart failure: Secondary | ICD-10-CM

## 2018-02-19 MED ORDER — FUROSEMIDE 40 MG PO TABS: 40.0000 mg | ORAL_TABLET | Freq: Every day | ORAL | 3 refills | Status: DC

## 2018-02-19 MED ORDER — NITROGLYCERIN 0.4 MG SL SUBL: 0.4000 mg | SUBLINGUAL_TABLET | SUBLINGUAL | 3 refills | Status: AC | PRN

## 2018-02-19 MED ORDER — METOPROLOL TARTRATE 100 MG PO TABS
100.0000 mg | ORAL_TABLET | Freq: Two times a day (BID) | ORAL | 3 refills | Status: DC
Start: 2018-02-19 — End: 2018-03-28

## 2018-02-19 MED ORDER — METOPROLOL TARTRATE 100 MG PO TABS: ORAL_TABLET | ORAL | 3 refills | Status: DC

## 2018-02-19 MED ORDER — POTASSIUM CHLORIDE CRYS ER 20 MEQ PO TBCR: 20.0000 meq | EXTENDED_RELEASE_TABLET | Freq: Every day | ORAL | 3 refills | Status: DC

## 2018-02-19 NOTE — Telephone Encounter (Signed)
Called patient about his symptoms. Patient complaining about weight gain 4 lbs in one week, swelling in his BLE, and has SOB. Patient stated his BP was 116/95 and HR 104. Patient does have history of A. FIB. Made an appointment with Ermalinda Barrios PA to further evaluate patient, okayed by Ermalinda Barrios PA. Patient agreed to plan.

## 2018-02-19 NOTE — Addendum Note (Signed)
Addended by: Aris Georgia, Deetra Booton L on: 02/19/2018 03:05 PM   Modules accepted: Orders

## 2018-02-19 NOTE — Progress Notes (Signed)
Cardiology Office Note    Date:  02/19/2018   ID:  Larry [name], DOB [birth year], MRN [id number]  PCP:  Celene Squibb, MD  Cardiologist: Jenkins Rouge, MD  Chief Complaint  Patient presents with  . Shortness of Breath    History of Present Illness:  Larry [name] is a 77 y.o. male history of CAD status post inferior MI with multiple interventions to the RCA in 2000 and 2001.  Negative Myoview in 2006.  Has persistent atrial fibrillation on Xarelto and declined cardioversion in the past. Last echo in 2014 LVEF 50%.   Last saw Dr. Johnsie Cancel 10/2017 and echo was ordered but not done.  Patient added on to my schedule today because of weight gain and increased shortness of breath.  Complains of 1 week of swelling in his leg, weight gain. Saw PCP last week and treated for URI with amoxicillin.  Was also placed on Lasix 20 mg daily and potassium but has not noticed a difference.  Heart has been racing between 89 and 120 bpm.  He is not taking any decongestants and has not missed any metoprolol.  He eats out daily but does not add salt.  Denies any chest pain, tightness, pressure.  Past Medical History:  Diagnosis Date  . Coronary artery disease   . Hypercholesterolemia   . Hypertension   . Myocardial infarct ([place])   . Olecranon bursitis     Past Surgical History:  Procedure Laterality Date  . CARDIAC CATHETERIZATION  12/15/1999   EF was 55%   . CORONARY ANGIOPLASTY WITH STENT PLACEMENT  08/15/1999   CAD, status post prior stenting of the mid to distal RCA in 11/1998, with an acute diaphragmatic wall infarction due to total occlusion at the stent site/There is also 70% narrowing in the diagonal branch of the LAD/The LV showed inferior wall hypo- akinesis.-- Successful reperfusion, percutaneous transluminal coronary percutaneous transluminal coronary & placement of a 2nd overlying stent in RCA    Current Medications: Current Meds  Medication Sig  . amoxicillin (AMOXIL) 500 MG tablet  Take 500 mg by mouth 2 (two) times daily.  Marland Kitchen aspirin 81 MG tablet Take 81 mg by mouth daily.  . furosemide (LASIX) 40 MG tablet Take 1 tablet (40 mg total) by mouth daily.  Marland Kitchen losartan (COZAAR) 50 MG tablet Take 50 mg by mouth daily.  . metoprolol tartrate (LOPRESSOR) 100 MG tablet TAKE ONE AND ONE-HALF TABLET BY MOUTH TWO TIMES DAILY  . nitroGLYCERIN (NITROSTAT) 0.4 MG SL tablet Place 0.4 mg under the tongue every 5 (five) minutes as needed for chest pain (3 doses max).  . pravastatin (PRAVACHOL) 40 MG tablet Take 1 tablet (40 mg total) by mouth every evening.  . rivaroxaban (XARELTO) 20 MG TABS tablet Take 1 tablet (20 mg total) by mouth daily with supper.  . [DISCONTINUED] furosemide (LASIX) 20 MG tablet Take 20 mg by mouth daily.  . [DISCONTINUED] metoprolol tartrate (LOPRESSOR) 50 MG tablet TAKE ONE AND ONE-HALF TABLET BY MOUTH TWO TIMES DAILY     Allergies:   Plavix [clopidogrel bisulfate]   Social History   Socioeconomic History  . Marital status: Married    Spouse name: scarlett  . Number of children: 3  . Years of education: 99  . Highest education level: Not on file  Occupational History  . Occupation: retired  Scientific laboratory technician  . Financial resource strain: Not on file  . Food insecurity:    Worry: Not on file    Inability: Not  on file  . Transportation needs:    Medical: Not on file    Non-medical: Not on file  Tobacco Use  . Smoking status: Former Smoker    Types: Cigarettes    Last attempt to quit: 11/02/1999    Years since quitting: 18.3  . Smokeless tobacco: Never Used  Substance and Sexual Activity  . Alcohol use: No  . Drug use: No  . Sexual activity: Not on file  Lifestyle  . Physical activity:    Days per week: Not on file    Minutes per session: Not on file  . Stress: Not on file  Relationships  . Social connections:    Talks on phone: Not on file    Gets together: Not on file    Attends religious service: Not on file    Active member of club or  organization: Not on file    Attends meetings of clubs or organizations: Not on file    Relationship status: Not on file  Other Topics Concern  . Not on file  Social History Narrative  . Not on file     Family History:  The patient's family history includes Cancer in his father; Heart Problems in his mother.   ROS:   Please see the history of present illness.    Review of Systems  Constitution: Positive for weight gain.  HENT: Negative.   Eyes: Negative.   Cardiovascular: Positive for dyspnea on exertion, irregular heartbeat, leg swelling and palpitations.  Respiratory: Positive for cough.   Hematologic/Lymphatic: Negative.   Musculoskeletal: Negative.  Negative for joint pain.  Gastrointestinal: Negative.   Genitourinary: Negative.   Neurological: Negative.    All other systems reviewed and are negative.   PHYSICAL EXAM:   VS:  BP 128/80 (BP Location: Right Arm, Patient Position: Sitting, Cuff Size: Normal)   Pulse 84   Ht 6' 1" (1.854 m)   Wt 206 lb (93.4 kg)   SpO2 96%   BMI 27.18 kg/m   Physical Exam  GEN: Well nourished, well developed, in no acute distress  Neck: no JVD, carotid bruits, or masses Cardiac: Irregular irregular at 681 bpm, 1/6 systolic murmur at the left sternal border Respiratory: Decreased breath sounds with few crackles at the bases GI: soft, nontender, nondistended, + BS Ext: +1 ankle edema otherwise without cyanosis, clubbing, Good distal pulses bilaterally Neuro:  Alert and Oriented x 3 Psych: euthymic mood, full affect  Wt Readings from Last 3 Encounters:  02/19/18 206 lb (93.4 kg)  10/19/17 202 lb 8 oz (91.9 kg)  04/16/17 201 lb 9.6 oz (91.4 kg)      Studies/Labs Reviewed:   EKG:  EKG is ordered today.  The ekg ordered today demonstrates atrial fibrillation at 111 bpm poor R wave progression anteriorly  Recent Labs: No results found for requested labs within last 8760 hours.   Lipid Panel    Component Value Date/Time   CHOL 139  04/12/2016 0923   TRIG 127 04/12/2016 0923   HDL 37 (L) 04/12/2016 0923   CHOLHDL 3.8 04/12/2016 0923   VLDL 25 04/12/2016 0923   LDLCALC 77 04/12/2016 0923   LDLDIRECT 144.3 08/15/2013 1303    Additional studies/ records that were reviewed today include:  2D echo 2014Study Conclusions  - Left ventricle: Septal and inferobasal wall hypokinesis   The cavity size was mildly dilated. Wall thickness was   increased in a pattern of mild LVH. The estimated ejection   fraction was 50%. - Left  atrium: The atrium was moderately dilated. - Right atrium: The atrium was mildly dilated. - Atrial septum: Redundant septum mobile Cannot r/o PFO - Pericardium, extracardiac: A trivial pericardial effusion   was identified. Transthoracic echocardiography.  M-mode, complete 2D, spectral Doppler, and color Doppler.  Height:  Height: 180.3cm. Height: 71in.  Weight:  Weight: 90.3kg. Weight: 198.6lb.  Body mass index:  BMI: 27.8kg/m^2.  Body surface area:    BSA: 2.33m2.  Blood pressure:     130/72.  Patient status:  Outpatient.  [facility]     ASSESSMENT:    1. Atrial fibrillation, rapid ([place]   2. Acute systolic congestive heart failure ([place]   3. Swelling of both lower extremities   4. SOB (shortness of breath)   5. Atherosclerosis of native coronary artery of native heart without angina pectoris   6. Hypercholesterolemia   7. Essential hypertension      PLAN:  In order of problems listed above:  Atrial fibrillation with RVR with associated heart failure.  Increase metoprolol to 100 mg twice daily.  I told him to take an extra 50 mg today when he gets home in addition to the the increase.  [place] to [place] is doing.  If he has any worsening CHF symptoms or rapid heartbeats he needs to come into the hospital.  Otherwise [place]  Acute CHF question secondary to rapid atrial fibrillation  versus new cardiomyopathy.  2D echo not done back in January when ordered.  We will have patient get echo once heart rate slows down.  Increase Lasix to 40 mg daily, potassium 20 mEq daily.  Check be met and BNP today.  2 g sodium diet.  He is to [place] is feeling.  If he does not begin to diurese he needs to come into the hospital.  Lower extremity edema and shortness of breath secondary to acute CHF.  CAD status post remote MI and interventions.  Patient has not had any angina.  With new CHF may need to consider stress test in the future.  Will await echo and follow-up with Dr. NJohnsie Cancel Hypercholesterolemia on Pravachol  Essential hypertension may need to decrease losartan if blood pressure lowers too much on the increase metoprolol Lasix.   Medication Adjustments/Labs and Tests Ordered: Current medicines are reviewed at length with the patient today.  Concerns regarding medicines are outlined above.  Medication changes, Labs and Tests ordered today are listed in the Patient Instructions below. Patient Instructions  Medication Instructions:  Your physician has recommended you make the following change in your medication:  1-START Lasix 40 mg by mouth daily 2-START Potassium 20 meq by mouth daily 3-INCREASE Metoprolol 100 mg by mouth twice daily  Labwork: Your physician recommends that you have lab work today- BMET and BNP  Testing/Procedures: NONE  Follow-Up: Your physician wants you to follow-up as already scheduled with Dr. NJohnsie Cancel Please give PJeannene Patellathe nurse a call on Thursday to inform her on how you are doing. [phone]  If you need a refill on your cardiac medications before your next appointment, please call your pharmacy.    Low-Sodium Eating Plan Sodium, which is an element that makes up salt, helps you maintain a healthy balance of fluids in your body. Too much sodium can increase your blood pressure and cause fluid and waste to be  held  in your body. Your health care provider or dietitian may recommend following this plan if you have high blood pressure (hypertension), kidney disease, liver disease, or heart failure. Eating less sodium can help lower your blood pressure, reduce swelling, and protect your heart, liver, and kidneys. What are tips for following this plan? General guidelines  Most people on this plan should limit their sodium intake to 1,500-2,000 mg (milligrams) of sodium each day. Reading food labels  The Nutrition Facts label lists the amount of sodium in one serving of the food. If you eat more than one serving, you must multiply the listed amount of sodium by the number of servings.  Choose foods with less than 140 mg of sodium per serving.  Avoid foods with 300 mg of sodium or more per serving. Shopping  Look for lower-sodium products, often labeled as "low-sodium" or "no salt added."  Always check the sodium content even if foods are labeled as "unsalted" or "no salt added".  Buy fresh foods. ? Avoid canned foods and premade or frozen meals. ? Avoid canned, cured, or processed meats  Buy breads that have less than 80 mg of sodium per slice. Cooking  Eat more home-cooked food and less restaurant, buffet, and fast food.  Avoid adding salt when cooking. Use salt-free seasonings or herbs instead of table salt or sea salt. Check with your health care provider or pharmacist before using salt substitutes.  Cook with plant-based oils, such as canola, sunflower, or olive oil. Meal planning  When eating at a restaurant, ask that your food be prepared with less salt or no salt, if possible.  Avoid foods that contain MSG (monosodium glutamate). MSG is sometimes added to [place] food, bouillon, and some canned foods. What foods are recommended? The items listed may not be a complete list. Talk with your dietitian about what dietary choices are best for you. Grains Low-sodium cereals, including oats,  puffed wheat and rice, and shredded wheat. Low-sodium crackers. Unsalted rice. Unsalted pasta. Low-sodium bread. Whole-grain breads and whole-grain pasta. Vegetables Fresh or frozen vegetables. "No salt added" canned vegetables. "No salt added" tomato sauce and paste. Low-sodium or reduced-sodium tomato and vegetable juice. Fruits Fresh, frozen, or canned fruit. Fruit juice. Meats and other protein foods Fresh or frozen (no salt added) meat, poultry, seafood, and fish. Low-sodium canned tuna and salmon. Unsalted nuts. Dried peas, beans, and lentils without added salt. Unsalted canned beans. Eggs. Unsalted nut butters. Dairy Milk. Soy milk. Cheese that is naturally low in sodium, such as ricotta cheese, fresh mozzarella, or Swiss cheese Low-sodium or reduced-sodium cheese. Cream cheese. Yogurt. Fats and oils Unsalted butter. Unsalted margarine with no trans fat. Vegetable oils such as canola or olive oils. Seasonings and other foods Fresh and dried herbs and spices. Salt-free seasonings. Low-sodium mustard and ketchup. Sodium-free salad dressing. Sodium-free light mayonnaise. Fresh or refrigerated horseradish. Lemon juice. Vinegar. Homemade, reduced-sodium, or low-sodium soups. Unsalted popcorn and pretzels. Low-salt or salt-free chips. What foods are not recommended? The items listed may not be a complete list. Talk with your dietitian about what dietary choices are best for you. Grains Instant hot cereals. Bread stuffing, pancake, and biscuit mixes. Croutons. Seasoned rice or pasta mixes. Noodle soup cups. Boxed or frozen macaroni and cheese. Regular salted crackers. Self-rising flour. Vegetables Sauerkraut, pickled vegetables, and relishes. Olives. [place] fries. Onion rings. Regular canned vegetables (not low-sodium or reduced-sodium). Regular canned tomato sauce and paste (not low-sodium or reduced-sodium). Regular tomato and vegetable juice (not low-sodium  or reduced-sodium). Frozen vegetables  in sauces. Meats and other protein foods Meat or fish that is salted, canned, smoked, spiced, or pickled. Bacon, ham, sausage, hotdogs, corned beef, chipped beef, packaged lunch meats, salt pork, jerky, pickled herring, anchovies, regular canned tuna, sardines, salted nuts. Dairy Processed cheese and cheese spreads. Cheese curds. Blue cheese. Feta cheese. String cheese. Regular cottage cheese. Buttermilk. Canned milk. Fats and oils Salted butter. Regular margarine. Ghee. Bacon fat. Seasonings and other foods Onion salt, garlic salt, seasoned salt, table salt, and sea salt. Canned and packaged gravies. Worcestershire sauce. Tartar sauce. Barbecue sauce. Teriyaki sauce. Soy sauce, including reduced-sodium. Steak sauce. Fish sauce. Oyster sauce. Cocktail sauce. Horseradish that you find on the shelf. Regular ketchup and mustard. Meat flavorings and tenderizers. Bouillon cubes. Hot sauce and Tabasco sauce. Premade or packaged marinades. Premade or packaged taco seasonings. Relishes. Regular salad dressings. Salsa. Potato and tortilla chips. Corn chips and puffs. Salted popcorn and pretzels. Canned or dried soups. Pizza. Frozen entrees and pot pies. Summary  Eating less sodium can help lower your blood pressure, reduce swelling, and protect your heart, liver, and kidneys.  Most people on this plan should limit their sodium intake to 1,500-2,000 mg (milligrams) of sodium each day.  Canned, boxed, and frozen foods are high in sodium. Restaurant foods, fast foods, and pizza are also very high in sodium. You also get sodium by adding salt to food.  Try to cook at home, eat more fresh fruits and vegetables, and eat less fast food, canned, processed, or prepared foods. This information is not intended to replace advice given to you by your health care provider. Make sure you discuss any questions you have with your health care provider. Document Released: 03/17/2002 Document Revised: 09/18/2016 Document  Reviewed: 09/18/2016 Elsevier Interactive Patient Education  2018 [place], Ermalinda Barrios, [place]  02/19/2018 2:59 PM    [facility] [place], [place], [place]  [address] Phone: [phone]; Fax: [phone]

## 2018-02-19 NOTE — Patient Instructions (Addendum)
Medication Instructions:  Your physician has recommended you make the following change in your medication:  1-START Lasix 40 mg by mouth daily 2-START Potassium 20 meq by mouth daily 3-INCREASE Metoprolol 100 mg by mouth twice daily  Labwork: Your physician recommends that you have lab work today- BMET and BNP  Testing/Procedures: NONE  Follow-Up: Your physician wants you to follow-up as already scheduled with Dr. Johnsie Cancel  Please give Jeannene Patella the nurse a call on Thursday to inform her on how you are doing. [phone].  If you need a refill on your cardiac medications before your next appointment, please call your pharmacy.    Low-Sodium Eating Plan Sodium, which is an element that makes up salt, helps you maintain a healthy balance of fluids in your body. Too much sodium can increase your blood pressure and cause fluid and waste to be held in your body. Your health care provider or dietitian may recommend following this plan if you have high blood pressure (hypertension), kidney disease, liver disease, or heart failure. Eating less sodium can help lower your blood pressure, reduce swelling, and protect your heart, liver, and kidneys. What are tips for following this plan? General guidelines  Most people on this plan should limit their sodium intake to 1,500-2,000 mg (milligrams) of sodium each day. Reading food labels  The Nutrition Facts label lists the amount of sodium in one serving of the food. If you eat more than one serving, you must multiply the listed amount of sodium by the number of servings.  Choose foods with less than 140 mg of sodium per serving.  Avoid foods with 300 mg of sodium or more per serving. Shopping  Look for lower-sodium products, often labeled as "low-sodium" or "no salt added."  Always check the sodium content even if foods are labeled as "unsalted" or "no salt added".  Buy fresh foods. ? Avoid canned foods and premade or frozen meals. ? Avoid  canned, cured, or processed meats  Buy breads that have less than 80 mg of sodium per slice. Cooking  Eat more home-cooked food and less restaurant, buffet, and fast food.  Avoid adding salt when cooking. Use salt-free seasonings or herbs instead of table salt or sea salt. Check with your health care provider or pharmacist before using salt substitutes.  Cook with plant-based oils, such as canola, sunflower, or olive oil. Meal planning  When eating at a restaurant, ask that your food be prepared with less salt or no salt, if possible.  Avoid foods that contain MSG (monosodium glutamate). MSG is sometimes added to [place] food, bouillon, and some canned foods. What foods are recommended? The items listed may not be a complete list. Talk with your dietitian about what dietary choices are best for you. Grains Low-sodium cereals, including oats, puffed wheat and rice, and shredded wheat. Low-sodium crackers. Unsalted rice. Unsalted pasta. Low-sodium bread. Whole-grain breads and whole-grain pasta. Vegetables Fresh or frozen vegetables. "No salt added" canned vegetables. "No salt added" tomato sauce and paste. Low-sodium or reduced-sodium tomato and vegetable juice. Fruits Fresh, frozen, or canned fruit. Fruit juice. Meats and other protein foods Fresh or frozen (no salt added) meat, poultry, seafood, and fish. Low-sodium canned tuna and salmon. Unsalted nuts. Dried peas, beans, and lentils without added salt. Unsalted canned beans. Eggs. Unsalted nut butters. Dairy Milk. Soy milk. Cheese that is naturally low in sodium, such as ricotta cheese, fresh mozzarella, or Swiss cheese Low-sodium or reduced-sodium cheese. Cream cheese. Yogurt. Fats and oils Unsalted butter. Unsalted  margarine with no trans fat. Vegetable oils such as canola or olive oils. Seasonings and other foods Fresh and dried herbs and spices. Salt-free seasonings. Low-sodium mustard and ketchup. Sodium-free salad dressing.  Sodium-free light mayonnaise. Fresh or refrigerated horseradish. Lemon juice. Vinegar. Homemade, reduced-sodium, or low-sodium soups. Unsalted popcorn and pretzels. Low-salt or salt-free chips. What foods are not recommended? The items listed may not be a complete list. Talk with your dietitian about what dietary choices are best for you. Grains Instant hot cereals. Bread stuffing, pancake, and biscuit mixes. Croutons. Seasoned rice or pasta mixes. Noodle soup cups. Boxed or frozen macaroni and cheese. Regular salted crackers. Self-rising flour. Vegetables Sauerkraut, pickled vegetables, and relishes. Olives. [place] fries. Onion rings. Regular canned vegetables (not low-sodium or reduced-sodium). Regular canned tomato sauce and paste (not low-sodium or reduced-sodium). Regular tomato and vegetable juice (not low-sodium or reduced-sodium). Frozen vegetables in sauces. Meats and other protein foods Meat or fish that is salted, canned, smoked, spiced, or pickled. Bacon, ham, sausage, hotdogs, corned beef, chipped beef, packaged lunch meats, salt pork, jerky, pickled herring, anchovies, regular canned tuna, sardines, salted nuts. Dairy Processed cheese and cheese spreads. Cheese curds. Blue cheese. Feta cheese. String cheese. Regular cottage cheese. Buttermilk. Canned milk. Fats and oils Salted butter. Regular margarine. Ghee. Bacon fat. Seasonings and other foods Onion salt, garlic salt, seasoned salt, table salt, and sea salt. Canned and packaged gravies. Worcestershire sauce. Tartar sauce. Barbecue sauce. Teriyaki sauce. Soy sauce, including reduced-sodium. Steak sauce. Fish sauce. Oyster sauce. Cocktail sauce. Horseradish that you find on the shelf. Regular ketchup and mustard. Meat flavorings and tenderizers. Bouillon cubes. Hot sauce and Tabasco sauce. Premade or packaged marinades. Premade or packaged taco seasonings. Relishes. Regular salad dressings. Salsa. Potato and tortilla chips. Corn chips  and puffs. Salted popcorn and pretzels. Canned or dried soups. Pizza. Frozen entrees and pot pies. Summary  Eating less sodium can help lower your blood pressure, reduce swelling, and protect your heart, liver, and kidneys.  Most people on this plan should limit their sodium intake to 1,500-2,000 mg (milligrams) of sodium each day.  Canned, boxed, and frozen foods are high in sodium. Restaurant foods, fast foods, and pizza are also very high in sodium. You also get sodium by adding salt to food.  Try to cook at home, eat more fresh fruits and vegetables, and eat less fast food, canned, processed, or prepared foods. This information is not intended to replace advice given to you by your health care provider. Make sure you discuss any questions you have with your health care provider. Document Released: 03/17/2002 Document Revised: 09/18/2016 Document Reviewed: 09/18/2016 Elsevier Interactive Patient Education  [employer].

## 2018-02-19 NOTE — Telephone Encounter (Signed)
Follow up    Pt c/o swelling: STAT is pt has developed SOB within 24 hours  1) How much weight have you gained and in what time span? 3lb  2) If swelling, where is the swelling located? Ankles and feet  3) Are you currently taking a fluid pill? Furosemide 20mg     4) Are you currently SOB? no  5) Do you have a log of your daily weights (if so, list)? no  6) Have you gained 3 pounds in a day or 5 pounds in a week? No just 3lbs in about a week  7) Have you traveled recently? no   Patient was given an antibiotic and the furosemide from his PCP.

## 2018-02-20 ENCOUNTER — Telehealth: Payer: Self-pay

## 2018-02-20 DIAGNOSIS — I509 Heart failure, unspecified: Secondary | ICD-10-CM

## 2018-02-20 LAB — BASIC METABOLIC PANEL
BUN/Creatinine Ratio: 22 (ref 10–24)
BUN: 21 mg/dL (ref 8–27)
CALCIUM: 9.3 mg/dL (ref 8.6–10.2)
CO2: 25 mmol/L (ref 20–29)
Chloride: 99 mmol/L (ref 96–106)
Creatinine, Ser: 0.94 mg/dL (ref 0.76–1.27)
GFR calc Af Amer: 91 mL/min/{1.73_m2} (ref >59)
GFR calc non Af Amer: 79 mL/min/{1.73_m2} (ref >59)
GLUCOSE: 130 mg/dL — AB (ref 65–99)
POTASSIUM: 4.3 mmol/L (ref 3.5–5.2)
SODIUM: 138 mmol/L (ref 134–144)

## 2018-02-20 LAB — PRO B NATRIURETIC PEPTIDE: NT-Pro BNP: 1527 pg/mL — ABNORMAL HIGH (ref 0–486)

## 2018-02-20 NOTE — Telephone Encounter (Signed)
-----   Message from Imogene Burn, PA-C sent at 02/20/2018  7:48 AM EDT ----- Yes he needs an echo. Laurey Arrow ordered one in Jan and it was never done.Now with new CHF ----- Message ----- From: Michaelyn Barter, RN Sent: 02/19/2018   5:13 PM To: Imogene Burn, PA-C  Karie Soda,  I do not see an echo order for this patient in his chart. Do you want me to get an echo on him before his next office visit?  Thanks, Pam

## 2018-02-20 NOTE — Telephone Encounter (Signed)
Called patient about his lab results. Informed patient that he will need an echo. Will place order and send message to scheduling to call to make his appointment. Patient stated he is feeling a little better and he has lost one pound.

## 2018-02-21 ENCOUNTER — Telehealth: Payer: Self-pay | Admitting: Cardiovascular Disease

## 2018-02-21 MED ORDER — FUROSEMIDE 40 MG PO TABS: 60.0000 mg | ORAL_TABLET | Freq: Every day | ORAL | Status: DC

## 2018-02-21 NOTE — Telephone Encounter (Signed)
New Message: ° ° ° ° ° ° °Pt is returning a call °

## 2018-02-21 NOTE — Telephone Encounter (Signed)
Called patient back. Patient stated he is feeling better than he was, but still has SOB. Patient stated he has only lost 1.5 lbs in the last couple of days. Patient stated he has a lot of output  In the morning, but not later on in the day. Consulted with Ermalinda Barrios PA. She recommend patient increase Lasix to 60 mg daily, and have lab work checked again in a week at his office visit in [place]. Patient agreed to plan.

## 2018-02-22 ENCOUNTER — Other Ambulatory Visit: Payer: Self-pay

## 2018-02-22 ENCOUNTER — Ambulatory Visit (HOSPITAL_COMMUNITY): Payer: Medicare HMO | Attending: Cardiovascular Disease

## 2018-02-22 DIAGNOSIS — I252 Old myocardial infarction: Secondary | ICD-10-CM | POA: Diagnosis not present

## 2018-02-22 DIAGNOSIS — I313 Pericardial effusion (noninflammatory): Secondary | ICD-10-CM | POA: Diagnosis not present

## 2018-02-22 DIAGNOSIS — I110 Hypertensive heart disease with heart failure: Secondary | ICD-10-CM | POA: Diagnosis not present

## 2018-02-22 DIAGNOSIS — E7800 Pure hypercholesterolemia, unspecified: Secondary | ICD-10-CM | POA: Diagnosis not present

## 2018-02-22 DIAGNOSIS — I509 Heart failure, unspecified: Secondary | ICD-10-CM | POA: Insufficient documentation

## 2018-02-22 DIAGNOSIS — I348 Other nonrheumatic mitral valve disorders: Secondary | ICD-10-CM | POA: Insufficient documentation

## 2018-02-27 NOTE — Progress Notes (Signed)
Cardiology Office Note    Date:  02/28/2018   ID:  Larry [name], DOB [birth year], MRN [id number]  PCP:  Celene Squibb, MD  Cardiologist: Jenkins Rouge, MD  No chief complaint on file.   History of Present Illness:  Larry [name] is a 77 y.o. male history of CAD status post inferior MI with multiple interventions to the RCA in 2000 and 2001.  Negative Myoview in 2006.  Has persistent atrial fibrillation on Xarelto and declined cardioversion in the past. Last echo in 2014 LVEF 50%.  Complains of 1 week of swelling in his leg, weight gain. Saw PCP last week and treated for URI with amoxicillin.  Was also placed on Lasix 20 mg daily and potassium but has not noticed a difference.  Heart has been racing between 89 and 120 bpm.  He is not taking any decongestants and has not missed any metoprolol.  He eats out daily but does not add salt.  Denies any chest pain, tightness, pressure.  Lasix increased to 60 mg daily  TTE 02/22/18   reviewed and EF 50-55% no bad valves or pulmonary HTN  In talking to wife and patient he is depressed. All started 6-8 weeks ago after a fall Poor appetite has not exercised Moved his bed to first floor has no stamina and  No interest in doing anything   Fatigue and Malaise are out of proportion to LV dysfunction and he has been in afib for years And been functional class one   Discussed option of attempting [facility] to take this variable out of equation but he and wife Did not want to pursue this   Past Medical History:  Diagnosis Date  . Coronary artery disease   . Hypercholesterolemia   . Hypertension   . Myocardial infarct ([place])   . Olecranon bursitis     Past Surgical History:  Procedure Laterality Date  . CARDIAC CATHETERIZATION  12/15/1999   EF was 55%   . CORONARY ANGIOPLASTY WITH STENT PLACEMENT  08/15/1999   CAD, status post prior stenting of the mid to distal RCA in 11/1998, with an acute diaphragmatic wall infarction due to total occlusion at the  stent site/There is also 70% narrowing in the diagonal branch of the LAD/The LV showed inferior wall hypo- akinesis.-- Successful reperfusion, percutaneous transluminal coronary percutaneous transluminal coronary & placement of a 2nd overlying stent in RCA    Current Medications: Current Meds  Medication Sig  . aspirin 81 MG tablet Take 81 mg by mouth daily.  . furosemide (LASIX) 40 MG tablet Take 1.5 tablets (60 mg total) by mouth daily.  Marland Kitchen losartan (COZAAR) 50 MG tablet Take 50 mg by mouth daily.  . metoprolol tartrate (LOPRESSOR) 100 MG tablet Take 1 tablet (100 mg total) by mouth 2 (two) times daily.  . nitroGLYCERIN (NITROSTAT) 0.4 MG SL tablet Place 1 tablet (0.4 mg total) under the tongue every 5 (five) minutes as needed for chest pain (3 doses max).  . potassium chloride SA (KLOR-CON M20) 20 MEQ tablet Take 1 tablet (20 mEq total) by mouth daily.  . pravastatin (PRAVACHOL) 40 MG tablet Take 1 tablet (40 mg total) by mouth every evening.  . rivaroxaban (XARELTO) 20 MG TABS tablet Take 1 tablet (20 mg total) by mouth daily with supper.     Allergies:   Plavix [clopidogrel bisulfate]   Social History   Socioeconomic History  . Marital status: Married    Spouse name: scarlett  . Number of children: 3  .  Years of education: 67  . Highest education level: Not on file  Occupational History  . Occupation: retired  Scientific laboratory technician  . Financial resource strain: Not on file  . Food insecurity:    Worry: Not on file    Inability: Not on file  . Transportation needs:    Medical: Not on file    Non-medical: Not on file  Tobacco Use  . Smoking status: Former Smoker    Types: Cigarettes    Last attempt to quit: 11/02/1999    Years since quitting: 18.3  . Smokeless tobacco: Never Used  Substance and Sexual Activity  . Alcohol use: No  . Drug use: No  . Sexual activity: Not on file  Lifestyle  . Physical activity:    Days per week: Not on file    Minutes per session: Not on file    . Stress: Not on file  Relationships  . Social connections:    Talks on phone: Not on file    Gets together: Not on file    Attends religious service: Not on file    Active member of club or organization: Not on file    Attends meetings of clubs or organizations: Not on file    Relationship status: Not on file  Other Topics Concern  . Not on file  Social History Narrative  . Not on file     Family History:  The patient's family history includes Cancer in his father; Heart Problems in his mother.   ROS:   Please see the history of present illness.    Review of Systems  Constitution: Positive for weight gain.  HENT: Negative.   Eyes: Negative.   Cardiovascular: Positive for dyspnea on exertion, irregular heartbeat, leg swelling and palpitations.  Respiratory: Positive for cough.   Hematologic/Lymphatic: Negative.   Musculoskeletal: Negative.  Negative for joint pain.  Gastrointestinal: Negative.   Genitourinary: Negative.   Neurological: Negative.    All other systems reviewed and are negative.   PHYSICAL EXAM:   VS:  BP 102/62   Pulse 78   Ht 6' (1.829 m)   Wt 200 lb 9.6 oz (91 kg)   SpO2 98%   BMI 27.21 kg/m   Physical Exam  Affect appropriate Healthy:  appears stated age 68: normal Neck supple with no adenopathy JVP normal no bruits no thyromegaly Lungs clear with no wheezing and good diaphragmatic motion Heart:  S1/S2 no murmur, no rub, gallop or click PMI normal Abdomen: benighn, BS positve, no tenderness, no AAA no bruit.  No HSM or HJR Distal pulses intact with no bruits No edema Neuro non-focal Skin warm and dry No muscular weakness   Wt Readings from Last 3 Encounters:  02/28/18 200 lb 9.6 oz (91 kg)  02/19/18 206 lb (93.4 kg)  10/19/17 202 lb 8 oz (91.9 kg)      Studies/Labs Reviewed:   EKG:   atrial fibrillation at 111 bpm poor R wave progression anteriorly  Recent Labs: 02/19/2018: BUN 21; Creatinine, Ser 0.94; NT-Pro BNP 1,527;  Potassium 4.3; Sodium 138   Lipid Panel    Component Value Date/Time   CHOL 139 04/12/2016 0923   TRIG 127 04/12/2016 0923   HDL 37 (L) 04/12/2016 0923   CHOLHDL 3.8 04/12/2016 0923   VLDL 25 04/12/2016 0923   LDLCALC 77 04/12/2016 0923   LDLDIRECT 144.3 08/15/2013 1303    Additional studies/ records that were reviewed today include:  2D echo 2014Study Conclusions  - Left ventricle:  Septal and inferobasal wall hypokinesis   The cavity size was mildly dilated. Wall thickness was   increased in a pattern of mild LVH. The estimated ejection   fraction was 50%. - Left atrium: The atrium was moderately dilated. - Right atrium: The atrium was mildly dilated. - Atrial septum: Redundant septum mobile Cannot r/o PFO - Pericardium, extracardiac: A trivial pericardial effusion   was identified. Transthoracic echocardiography.  M-mode, complete 2D, spectral Doppler, and color Doppler.  Height:  Height: 180.3cm. Height: 71in.  Weight:  Weight: 90.3kg. Weight: 198.6lb.  Body mass index:  BMI: 27.8kg/m^2.  Body surface area:    BSA: 2.51m^2.  Blood pressure:     130/72.  Patient status:  Outpatient.  Location:  [facility]     ASSESSMENT:    1. Coronary artery disease involving native coronary artery of native heart with angina pectoris (HCC)      PLAN:  In order of problems listed above:  Atrial fibrillation chronic with rate control and anticoagulation beta blocker increased Doubt he would convert With chronic afib and severe biatrial enlargement on TTE continue xarelto Offerred attempt at [facility]  But patient again declined   Acute CHF lasix increased to 60 mg daily TTE reviewed from 02/22/18 EF 50-55% old IMI no significant Valve disease estimated PA 33 mmHg Check BMET BNP today    CAD status post remote MI and interventions 18 years ago no angina observe   Hypercholesterolemia on Pravachol  HTN:  Well controlled.  Continue current medications and low sodium Dash type  diet.    Depression:  Refer to cardiac rehab to get him more active in controlled setting f/u with primary to Start medication     Jenkins Rouge

## 2018-02-28 ENCOUNTER — Other Ambulatory Visit (HOSPITAL_COMMUNITY)
Admission: RE | Admit: 2018-02-28 | Discharge: 2018-02-28 | Disposition: A | Discharge status: Home / Self Care | Payer: Medicare HMO | Source: Ambulatory Visit | Attending: Cardiovascular Disease | Admitting: Cardiovascular Disease

## 2018-02-28 ENCOUNTER — Encounter: Payer: Self-pay | Admitting: Cardiovascular Disease

## 2018-02-28 ENCOUNTER — Ambulatory Visit: Payer: Medicare HMO | Admitting: Cardiovascular Disease

## 2018-02-28 VITALS — BP 102/62 | HR 78 | Ht 72.0 in | Wt 200.6 lb

## 2018-02-28 DIAGNOSIS — I25119 Atherosclerotic heart disease of native coronary artery with unspecified angina pectoris: Secondary | ICD-10-CM

## 2018-02-28 LAB — BASIC METABOLIC PANEL
Anion gap: 12 (ref 5–15)
BUN: 29 mg/dL — AB (ref 6–20)
CALCIUM: 9.1 mg/dL (ref 8.9–10.3)
CO2: 26 mmol/L (ref 22–32)
CREATININE: 1.12 mg/dL (ref 0.61–1.24)
Chloride: 95 mmol/L — ABNORMAL LOW (ref 101–111)
GFR calc Af Amer: >60 mL/min (ref >60)
GLUCOSE: 130 mg/dL — AB (ref 65–99)
POTASSIUM: 4.4 mmol/L (ref 3.5–5.1)
Sodium: 133 mmol/L — ABNORMAL LOW (ref 135–145)

## 2018-02-28 LAB — BRAIN NATRIURETIC PEPTIDE: B NATRIURETIC PEPTIDE 5: 258 pg/mL — AB (ref 0.0–100.0)

## 2018-02-28 NOTE — Patient Instructions (Signed)
Medication Instructions:  Your physician recommends that you continue on your current medications as directed. Please refer to the Current Medication list given to you today.   Labwork: TODAY BMET BNP  Testing/Procedures: NONE  Follow-Up: Your physician recommends that you schedule a follow-up appointment in: 8 WEEKS    Any Other Special Instructions Will Be Listed Below (If Applicable). You have been referred to [place]. They will contact you soon.      If you need a refill on your cardiac medications before your next appointment, please call your pharmacy.

## 2018-03-11 ENCOUNTER — Telehealth: Payer: Self-pay | Admitting: Cardiovascular Disease

## 2018-03-11 MED ORDER — POTASSIUM CHLORIDE CRYS ER 10 MEQ PO TBCR: 20.0000 meq | EXTENDED_RELEASE_TABLET | Freq: Every day | ORAL | 3 refills | Status: DC

## 2018-03-11 NOTE — Telephone Encounter (Signed)
Called patient about his message. Patient complaining of swelling in his BLE and abdomen. Patient stated his energy has decreased. BP 103/85 HR 100. Patient stated he has gone back to taking metoprolol 75 mg BID. He also stopped his losartan 50 mg due to side effects and diarrhea. Patient stated he weighs 206 lbs today. At last office visit on 02/28/18,  he was 200 lbs.  Patient also increased his lasix to 40 mg BID, instead of taking what is prescribed lasix 60 mg by mouth daily. Will send to Dr. Johnsie Cancel for advisement

## 2018-03-11 NOTE — Telephone Encounter (Signed)
New message    Pt c/o swelling: STAT is pt has developed SOB within 24 hours  1) How much weight have you gained and in what time span?  2) If swelling, where is the swelling located? FEET, ANKLES, LEGS, ABDOMEN  3) Are you currently taking a fluid pill? YES  4) Are you currently SOB? NO  5) Do you have a log of your daily weights (if so, list)? 206 LBS today  6) Have you gained 3 pounds in a day or 5 pounds in a week?   7) Have you traveled recently? NO

## 2018-03-11 NOTE — Telephone Encounter (Signed)
fU with primary his EF is not that bad and his last BNP was near normal should be in cardiac rehab by now

## 2018-03-11 NOTE — Telephone Encounter (Signed)
Called patient back with Dr. Kyla Balzarine advisement. Patient stated he is having a hard time taking his potassium, that the pill is too big. Sent in potassium 10 meq take 2 tablets by mouth with his lasix. Patient stated he would call his PCP.

## 2018-03-14 DIAGNOSIS — R0602 Shortness of breath: Secondary | ICD-10-CM | POA: Diagnosis not present

## 2018-03-14 DIAGNOSIS — R531 Weakness: Secondary | ICD-10-CM | POA: Diagnosis not present

## 2018-03-14 DIAGNOSIS — R600 Localized edema: Secondary | ICD-10-CM | POA: Diagnosis not present

## 2018-03-14 DIAGNOSIS — I480 Paroxysmal atrial fibrillation: Secondary | ICD-10-CM | POA: Diagnosis not present

## 2018-03-14 DIAGNOSIS — Z6828 Body mass index (BMI) 28.0-28.9, adult: Secondary | ICD-10-CM | POA: Diagnosis not present

## 2018-03-14 DIAGNOSIS — R5383 Other fatigue: Secondary | ICD-10-CM | POA: Diagnosis not present

## 2018-03-15 DIAGNOSIS — I2510 Atherosclerotic heart disease of native coronary artery without angina pectoris: Secondary | ICD-10-CM | POA: Diagnosis not present

## 2018-03-15 DIAGNOSIS — Z0001 Encounter for general adult medical examination with abnormal findings: Secondary | ICD-10-CM | POA: Diagnosis not present

## 2018-03-15 DIAGNOSIS — R2230 Localized swelling, mass and lump, unspecified upper limb: Secondary | ICD-10-CM | POA: Diagnosis not present

## 2018-03-15 DIAGNOSIS — Z6828 Body mass index (BMI) 28.0-28.9, adult: Secondary | ICD-10-CM | POA: Diagnosis not present

## 2018-03-15 DIAGNOSIS — R600 Localized edema: Secondary | ICD-10-CM | POA: Diagnosis not present

## 2018-03-15 DIAGNOSIS — M25562 Pain in left knee: Secondary | ICD-10-CM | POA: Diagnosis not present

## 2018-03-15 DIAGNOSIS — I480 Paroxysmal atrial fibrillation: Secondary | ICD-10-CM | POA: Diagnosis not present

## 2018-03-15 DIAGNOSIS — E782 Mixed hyperlipidemia: Secondary | ICD-10-CM | POA: Diagnosis not present

## 2018-03-15 DIAGNOSIS — R531 Weakness: Secondary | ICD-10-CM | POA: Diagnosis not present

## 2018-03-15 DIAGNOSIS — R0602 Shortness of breath: Secondary | ICD-10-CM | POA: Diagnosis not present

## 2018-03-18 DIAGNOSIS — D519 Vitamin B12 deficiency anemia, unspecified: Secondary | ICD-10-CM | POA: Diagnosis not present

## 2018-03-18 DIAGNOSIS — E782 Mixed hyperlipidemia: Secondary | ICD-10-CM | POA: Diagnosis not present

## 2018-03-18 DIAGNOSIS — Z6827 Body mass index (BMI) 27.0-27.9, adult: Secondary | ICD-10-CM | POA: Diagnosis not present

## 2018-03-18 DIAGNOSIS — R600 Localized edema: Secondary | ICD-10-CM | POA: Diagnosis not present

## 2018-03-18 DIAGNOSIS — I482 Chronic atrial fibrillation: Secondary | ICD-10-CM | POA: Diagnosis not present

## 2018-03-18 DIAGNOSIS — R531 Weakness: Secondary | ICD-10-CM | POA: Diagnosis not present

## 2018-03-18 DIAGNOSIS — I509 Heart failure, unspecified: Secondary | ICD-10-CM | POA: Diagnosis not present

## 2018-03-22 ENCOUNTER — Encounter (HOSPITAL_COMMUNITY): Payer: Self-pay | Admitting: Emergency Medicine

## 2018-03-22 ENCOUNTER — Emergency Department (HOSPITAL_COMMUNITY): Payer: Medicare HMO

## 2018-03-22 ENCOUNTER — Inpatient Hospital Stay (HOSPITAL_COMMUNITY)
Admission: EM | Admit: 2018-03-22 | Discharge: 2018-03-28 | DRG: 292 | Disposition: A | Discharge status: Home / Self Care | Payer: Medicare HMO | Attending: Internal Medicine | Admitting: Internal Medicine

## 2018-03-22 ENCOUNTER — Other Ambulatory Visit: Payer: Self-pay

## 2018-03-22 DIAGNOSIS — I252 Old myocardial infarction: Secondary | ICD-10-CM | POA: Diagnosis not present

## 2018-03-22 DIAGNOSIS — Z7901 Long term (current) use of anticoagulants: Secondary | ICD-10-CM | POA: Diagnosis not present

## 2018-03-22 DIAGNOSIS — Z87891 Personal history of nicotine dependence: Secondary | ICD-10-CM | POA: Diagnosis not present

## 2018-03-22 DIAGNOSIS — I10 Essential (primary) hypertension: Secondary | ICD-10-CM | POA: Diagnosis present

## 2018-03-22 DIAGNOSIS — Z955 Presence of coronary angioplasty implant and graft: Secondary | ICD-10-CM | POA: Diagnosis not present

## 2018-03-22 DIAGNOSIS — Z888 Allergy status to other drugs, medicaments and biological substances status: Secondary | ICD-10-CM | POA: Diagnosis not present

## 2018-03-22 DIAGNOSIS — R945 Abnormal results of liver function studies: Secondary | ICD-10-CM

## 2018-03-22 DIAGNOSIS — I482 Chronic atrial fibrillation, unspecified: Secondary | ICD-10-CM

## 2018-03-22 DIAGNOSIS — R601 Generalized edema: Secondary | ICD-10-CM | POA: Diagnosis not present

## 2018-03-22 DIAGNOSIS — G4733 Obstructive sleep apnea (adult) (pediatric): Secondary | ICD-10-CM | POA: Diagnosis not present

## 2018-03-22 DIAGNOSIS — T148XXA Other injury of unspecified body region, initial encounter: Secondary | ICD-10-CM

## 2018-03-22 DIAGNOSIS — I509 Heart failure, unspecified: Secondary | ICD-10-CM | POA: Diagnosis not present

## 2018-03-22 DIAGNOSIS — I4891 Unspecified atrial fibrillation: Secondary | ICD-10-CM

## 2018-03-22 DIAGNOSIS — R17 Unspecified jaundice: Secondary | ICD-10-CM

## 2018-03-22 DIAGNOSIS — Z809 Family history of malignant neoplasm, unspecified: Secondary | ICD-10-CM | POA: Diagnosis not present

## 2018-03-22 DIAGNOSIS — I2584 Coronary atherosclerosis due to calcified coronary lesion: Secondary | ICD-10-CM | POA: Diagnosis not present

## 2018-03-22 DIAGNOSIS — I5031 Acute diastolic (congestive) heart failure: Secondary | ICD-10-CM

## 2018-03-22 DIAGNOSIS — N179 Acute kidney failure, unspecified: Secondary | ICD-10-CM

## 2018-03-22 DIAGNOSIS — R7989 Other specified abnormal findings of blood chemistry: Secondary | ICD-10-CM

## 2018-03-22 DIAGNOSIS — I110 Hypertensive heart disease with heart failure: Principal | ICD-10-CM | POA: Diagnosis present

## 2018-03-22 DIAGNOSIS — I481 Persistent atrial fibrillation: Secondary | ICD-10-CM | POA: Diagnosis not present

## 2018-03-22 DIAGNOSIS — I2510 Atherosclerotic heart disease of native coronary artery without angina pectoris: Secondary | ICD-10-CM | POA: Diagnosis present

## 2018-03-22 DIAGNOSIS — T502X5A Adverse effect of carbonic-anhydrase inhibitors, benzothiadiazides and other diuretics, initial encounter: Secondary | ICD-10-CM | POA: Diagnosis present

## 2018-03-22 DIAGNOSIS — E7800 Pure hypercholesterolemia, unspecified: Secondary | ICD-10-CM | POA: Diagnosis present

## 2018-03-22 DIAGNOSIS — Z7982 Long term (current) use of aspirin: Secondary | ICD-10-CM

## 2018-03-22 DIAGNOSIS — I480 Paroxysmal atrial fibrillation: Secondary | ICD-10-CM | POA: Diagnosis present

## 2018-03-22 DIAGNOSIS — M7020 Olecranon bursitis, unspecified elbow: Secondary | ICD-10-CM | POA: Diagnosis present

## 2018-03-22 DIAGNOSIS — I5033 Acute on chronic diastolic (congestive) heart failure: Secondary | ICD-10-CM | POA: Diagnosis not present

## 2018-03-22 DIAGNOSIS — I25118 Atherosclerotic heart disease of native coronary artery with other forms of angina pectoris: Secondary | ICD-10-CM | POA: Diagnosis not present

## 2018-03-22 DIAGNOSIS — E785 Hyperlipidemia, unspecified: Secondary | ICD-10-CM | POA: Diagnosis not present

## 2018-03-22 DIAGNOSIS — E876 Hypokalemia: Secondary | ICD-10-CM | POA: Diagnosis present

## 2018-03-22 DIAGNOSIS — K760 Fatty (change of) liver, not elsewhere classified: Secondary | ICD-10-CM | POA: Diagnosis not present

## 2018-03-22 DIAGNOSIS — R0602 Shortness of breath: Secondary | ICD-10-CM | POA: Diagnosis not present

## 2018-03-22 LAB — BASIC METABOLIC PANEL
Anion gap: 12 (ref 5–15)
BUN: 32 mg/dL — ABNORMAL HIGH (ref 6–20)
CO2: 27 mmol/L (ref 22–32)
CREATININE: 1.53 mg/dL — AB (ref 0.61–1.24)
Calcium: 9 mg/dL (ref 8.9–10.3)
Chloride: 95 mmol/L — ABNORMAL LOW (ref 101–111)
GFR, EST AFRICAN AMERICAN: 50 mL/min — AB (ref >60)
GFR, EST NON AFRICAN AMERICAN: 43 mL/min — AB (ref >60)
GLUCOSE: 102 mg/dL — AB (ref 65–99)
Potassium: 5.2 mmol/L — ABNORMAL HIGH (ref 3.5–5.1)
Sodium: 134 mmol/L — ABNORMAL LOW (ref 135–145)

## 2018-03-22 LAB — CBC
HCT: 50.7 % (ref 39.0–52.0)
Hemoglobin: 15.8 g/dL (ref 13.0–17.0)
MCH: 27.7 pg (ref 26.0–34.0)
MCHC: 31.2 g/dL (ref 30.0–36.0)
MCV: 88.9 fL (ref 78.0–100.0)
PLATELETS: 275 10*3/uL (ref 150–400)
RBC: 5.7 MIL/uL (ref 4.22–5.81)
RDW: 16.7 % — ABNORMAL HIGH (ref 11.5–15.5)
WBC: 10.7 10*3/uL — ABNORMAL HIGH (ref 4.0–10.5)

## 2018-03-22 LAB — BRAIN NATRIURETIC PEPTIDE: B Natriuretic Peptide: 468.2 pg/mL — ABNORMAL HIGH (ref 0.0–100.0)

## 2018-03-22 LAB — I-STAT TROPONIN, ED: TROPONIN I, POC: 0.01 ng/mL (ref 0.00–0.08)

## 2018-03-22 NOTE — ED Triage Notes (Signed)
Pt presents with increased BLE edema, abd edema with weeping, increased WOB, and increased fatigue; takes 40mg  BID without improvement, decreased UOP

## 2018-03-23 ENCOUNTER — Telehealth: Payer: Self-pay | Admitting: Cardiology

## 2018-03-23 ENCOUNTER — Encounter (HOSPITAL_COMMUNITY): Payer: Self-pay | Admitting: Internal Medicine

## 2018-03-23 ENCOUNTER — Encounter: Payer: Self-pay | Admitting: Cardiology

## 2018-03-23 ENCOUNTER — Inpatient Hospital Stay (HOSPITAL_COMMUNITY): Payer: Medicare HMO

## 2018-03-23 DIAGNOSIS — I10 Essential (primary) hypertension: Secondary | ICD-10-CM

## 2018-03-23 DIAGNOSIS — I5031 Acute diastolic (congestive) heart failure: Secondary | ICD-10-CM

## 2018-03-23 DIAGNOSIS — N179 Acute kidney failure, unspecified: Secondary | ICD-10-CM

## 2018-03-23 DIAGNOSIS — I2584 Coronary atherosclerosis due to calcified coronary lesion: Secondary | ICD-10-CM

## 2018-03-23 DIAGNOSIS — I509 Heart failure, unspecified: Secondary | ICD-10-CM

## 2018-03-23 DIAGNOSIS — I2510 Atherosclerotic heart disease of native coronary artery without angina pectoris: Secondary | ICD-10-CM

## 2018-03-23 DIAGNOSIS — I4891 Unspecified atrial fibrillation: Secondary | ICD-10-CM

## 2018-03-23 DIAGNOSIS — R601 Generalized edema: Secondary | ICD-10-CM

## 2018-03-23 LAB — HEPATIC FUNCTION PANEL
ALT: 39 U/L (ref 17–63)
AST: 57 U/L — AB (ref 15–41)
Albumin: 3 g/dL — ABNORMAL LOW (ref 3.5–5.0)
Alkaline Phosphatase: 137 U/L — ABNORMAL HIGH (ref 38–126)
BILIRUBIN DIRECT: 1.2 mg/dL — AB (ref 0.1–0.5)
BILIRUBIN INDIRECT: 1.8 mg/dL — AB (ref 0.3–0.9)
Total Bilirubin: 3 mg/dL — ABNORMAL HIGH (ref 0.3–1.2)
Total Protein: 6.3 g/dL — ABNORMAL LOW (ref 6.5–8.1)

## 2018-03-23 LAB — CBC
HEMATOCRIT: 51.3 % (ref 39.0–52.0)
Hemoglobin: 15.8 g/dL (ref 13.0–17.0)
MCH: 27.1 pg (ref 26.0–34.0)
MCHC: 30.8 g/dL (ref 30.0–36.0)
MCV: 88 fL (ref 78.0–100.0)
Platelets: 257 10*3/uL (ref 150–400)
RBC: 5.83 MIL/uL — ABNORMAL HIGH (ref 4.22–5.81)
RDW: 16.8 % — AB (ref 11.5–15.5)
WBC: 11.3 10*3/uL — ABNORMAL HIGH (ref 4.0–10.5)

## 2018-03-23 LAB — BASIC METABOLIC PANEL
Anion gap: 12 (ref 5–15)
BUN: 29 mg/dL — AB (ref 6–20)
CHLORIDE: 94 mmol/L — AB (ref 101–111)
CO2: 27 mmol/L (ref 22–32)
Calcium: 8.8 mg/dL — ABNORMAL LOW (ref 8.9–10.3)
Creatinine, Ser: 1.34 mg/dL — ABNORMAL HIGH (ref 0.61–1.24)
GFR calc Af Amer: 58 mL/min — ABNORMAL LOW (ref >60)
GFR calc non Af Amer: 50 mL/min — ABNORMAL LOW (ref >60)
Glucose, Bld: 187 mg/dL — ABNORMAL HIGH (ref 65–99)
POTASSIUM: 3.8 mmol/L (ref 3.5–5.1)
SODIUM: 133 mmol/L — AB (ref 135–145)

## 2018-03-23 LAB — URINALYSIS, ROUTINE W REFLEX MICROSCOPIC
Bilirubin Urine: NEGATIVE
Glucose, UA: NEGATIVE mg/dL
Hgb urine dipstick: NEGATIVE
Ketones, ur: NEGATIVE mg/dL
LEUKOCYTES UA: NEGATIVE
NITRITE: NEGATIVE
PH: 5 (ref 5.0–8.0)
Protein, ur: NEGATIVE mg/dL
SPECIFIC GRAVITY, URINE: 1.009 (ref 1.005–1.030)

## 2018-03-23 LAB — MAGNESIUM: Magnesium: 2 mg/dL (ref 1.7–2.4)

## 2018-03-23 MED ORDER — FUROSEMIDE 10 MG/ML IJ SOLN
40.0000 mg | Freq: Once | INTRAMUSCULAR | Status: AC
  Administered 2018-03-23: 40 mg via INTRAVENOUS
  Filled 2018-03-23: qty 4

## 2018-03-23 MED ORDER — POTASSIUM CHLORIDE CRYS ER 20 MEQ PO TBCR: 10.0000 meq | EXTENDED_RELEASE_TABLET | Freq: Two times a day (BID) | ORAL | Status: DC

## 2018-03-23 MED ORDER — ONDANSETRON HCL 4 MG/2ML IJ SOLN: 4.0000 mg | Freq: Four times a day (QID) | INTRAMUSCULAR | Status: DC | PRN

## 2018-03-23 MED ORDER — ONDANSETRON HCL 4 MG PO TABS: 4.0000 mg | ORAL_TABLET | Freq: Four times a day (QID) | ORAL | Status: DC | PRN

## 2018-03-23 MED ORDER — PRAVASTATIN SODIUM 40 MG PO TABS
40.0000 mg | ORAL_TABLET | Freq: Every evening | ORAL | Status: DC
  Administered 2018-03-23 – 2018-03-27 (×5): 40 mg via ORAL
  Filled 2018-03-23 (×5): qty 1

## 2018-03-23 MED ORDER — ACETAMINOPHEN 325 MG PO TABS: 650.0000 mg | ORAL_TABLET | Freq: Four times a day (QID) | ORAL | Status: DC | PRN

## 2018-03-23 MED ORDER — FUROSEMIDE 10 MG/ML IJ SOLN
60.0000 mg | Freq: Two times a day (BID) | INTRAMUSCULAR | Status: DC
  Administered 2018-03-23 – 2018-03-24 (×4): 60 mg via INTRAVENOUS
  Filled 2018-03-23 (×4): qty 6

## 2018-03-23 MED ORDER — POTASSIUM CHLORIDE CRYS ER 10 MEQ PO TBCR
10.0000 meq | EXTENDED_RELEASE_TABLET | Freq: Two times a day (BID) | ORAL | Status: DC
  Administered 2018-03-23 – 2018-03-24 (×3): 10 meq via ORAL
  Filled 2018-03-23 (×3): qty 1

## 2018-03-23 MED ORDER — NITROGLYCERIN 0.4 MG SL SUBL: 0.4000 mg | SUBLINGUAL_TABLET | SUBLINGUAL | Status: DC | PRN

## 2018-03-23 MED ORDER — METOPROLOL TARTRATE 50 MG PO TABS
75.0000 mg | ORAL_TABLET | Freq: Two times a day (BID) | ORAL | Status: DC
  Administered 2018-03-23 – 2018-03-28 (×10): 75 mg via ORAL
  Filled 2018-03-23 (×10): qty 1

## 2018-03-23 MED ORDER — ASPIRIN 81 MG PO CHEW
81.0000 mg | CHEWABLE_TABLET | Freq: Every day | ORAL | Status: DC
  Administered 2018-03-23 – 2018-03-27 (×4): 81 mg via ORAL
  Filled 2018-03-23 (×5): qty 1

## 2018-03-23 MED ORDER — ACETAMINOPHEN 650 MG RE SUPP: 650.0000 mg | Freq: Four times a day (QID) | RECTAL | Status: DC | PRN

## 2018-03-23 MED ORDER — RIVAROXABAN 20 MG PO TABS
20.0000 mg | ORAL_TABLET | Freq: Every day | ORAL | Status: DC
  Administered 2018-03-23 – 2018-03-26 (×4): 20 mg via ORAL
  Filled 2018-03-23 (×4): qty 1

## 2018-03-23 NOTE — ED Notes (Signed)
Patient transported to Ultrasound 

## 2018-03-23 NOTE — H&P (Signed)
History and Physical    Luisangel Wainright TIW:[id number] DOB: 1941/04/21 DOA: 03/22/2018  PCP: Celene Squibb, MD  Patient coming from: Home.  Chief Complaint: Increasing abdominal girth and lower extremity edema.  HPI: Larry [name] is a 77 y.o. male with history of CHF, paroxysmal atrial fibrillation, CAD, hyperlipidemia presents to the ER because of worsening abdominal girth with fatigue and lower extremity edema.  Patient has recently noticed that increasing swelling and had gone to his cardiologist who increased his diuretics Lasix from 20 to 60 mg despite which his swelling was worsening and primary care physician increased it further to 80 mg.  Despite which patient swelling is worse and has been having some fluid leaking from his flanks.  Patient has surgery denies any chest pain or shortness of breath productive cough fever or chills.  Has been having decreased urine output despite taking Lasix.  ED Course: In the ER patient is found to be having anasarca chest x-ray is compatible with congestion.  Labs revealed worsening renal function with mildly elevated bilirubin.  Patient admitted for anasarca likely from CHF.  UA is pending.  Review of Systems: As per HPI, rest all negative.   Past Medical History:  Diagnosis Date  . Coronary artery disease   . Hypercholesterolemia   . Hypertension   . Myocardial infarct ([place])   . Olecranon bursitis     Past Surgical History:  Procedure Laterality Date  . CARDIAC CATHETERIZATION  12/15/1999   EF was 55%   . CORONARY ANGIOPLASTY WITH STENT PLACEMENT  08/15/1999   CAD, status post prior stenting of the mid to distal RCA in 11/1998, with an acute diaphragmatic wall infarction due to total occlusion at the stent site/There is also 70% narrowing in the diagonal branch of the LAD/The LV showed inferior wall hypo- akinesis.-- Successful reperfusion, percutaneous transluminal coronary percutaneous transluminal coronary & placement of a 2nd overlying  stent in RCA     reports that he quit smoking about 18 years ago. His smoking use included cigarettes. He has never used smokeless tobacco. He reports that he does not drink alcohol or use drugs.  Allergies  Allergen Reactions  . Plavix [Clopidogrel Bisulfate] Anaphylaxis         Family History  Problem Relation Age of Onset  . Heart Problems Mother   . Cancer Father     Prior to Admission medications   Medication Sig Start Date End Date Taking? Authorizing Provider  aspirin 81 MG tablet Take 81 mg by mouth at bedtime.    Yes [provider]  furosemide (LASIX) 40 MG tablet Take 1.5 tablets (60 mg total) by mouth daily. Patient taking differently: Take 40 mg by mouth 2 (two) times daily.  02/21/18  Yes Imogene Burn, PA-C  metoprolol tartrate (LOPRESSOR) 50 MG tablet Take 75 mg by mouth 2 (two) times daily.   Yes [provider]  nitroGLYCERIN (NITROSTAT) 0.4 MG SL tablet Place 1 tablet (0.4 mg total) under the tongue every 5 (five) minutes as needed for chest pain (3 doses max). 02/19/18  Yes Imogene Burn, PA-C  potassium chloride (K-DUR,KLOR-CON) 10 MEQ tablet Take 2 tablets (20 mEq total) by mouth daily. Patient taking differently: Take 10 mEq by mouth 2 (two) times daily.  03/11/18  Yes Josue Hector, MD  pravastatin (PRAVACHOL) 40 MG tablet Take 1 tablet (40 mg total) by mouth every evening. 12/24/17  Yes Josue Hector, MD  rivaroxaban (XARELTO) 20 MG TABS tablet Take  1 tablet (20 mg total) by mouth daily with supper. 10/10/17  Yes Josue Hector, MD  metoprolol tartrate (LOPRESSOR) 100 MG tablet Take 1 tablet (100 mg total) by mouth 2 (two) times daily. Patient not taking: Reported on 03/22/2018 02/19/18   Imogene Burn, PA-C    Physical Exam: Vitals:   03/23/18 0115 03/23/18 0200 03/23/18 0215 03/23/18 0345  BP: 112/89 (!) 119/98    Pulse:      Resp: 17 (!) 23  18  Temp:      TempSrc:      SpO2:   98% 93%  Weight:          Constitutional:  Moderately built and nourished. Vitals:   03/23/18 0115 03/23/18 0200 03/23/18 0215 03/23/18 0345  BP: 112/89 (!) 119/98    Pulse:      Resp: 17 (!) 23  18  Temp:      TempSrc:      SpO2:   98% 93%  Weight:       Eyes: Anicteric no pallor. ENMT: No discharge from the ears eyes nose or mouth. Neck: No mass palpated no JVD appreciated. Respiratory: No rhonchi or crepitations. Cardiovascular: S1-S2 heard no murmurs appreciated. Abdomen: Distended nontender bowel sounds present. Musculoskeletal: Bilateral lower extremity edema present. Skin: No rash. Neurologic: Alert awake oriented to time place and person.  Moves all extremities. Psychiatric: Appears normal.  Normal affect.   Labs on Admission: I have personally reviewed following labs and imaging studies  CBC: Recent Labs  Lab 03/22/18 1835  WBC 10.7*  HGB 15.8  HCT 50.7  MCV 88.9  PLT 762   Basic Metabolic Panel: Recent Labs  Lab 03/22/18 1835  NA 134*  K 5.2*  CL 95*  CO2 27  GLUCOSE 102*  BUN 32*  CREATININE 1.53*  CALCIUM 9.0   GFR: Estimated Creatinine Clearance: 49.5 mL/min (A) (by C-G formula based on SCr of 1.53 mg/dL (H)). Liver Function Tests: Recent Labs  Lab 03/22/18 1835  AST 57*  ALT 39  ALKPHOS 137*  BILITOT 3.0*  PROT 6.3*  ALBUMIN 3.0*   No results for input(s): LIPASE, AMYLASE in the last 168 hours. No results for input(s): AMMONIA in the last 168 hours. Coagulation Profile: No results for input(s): INR, PROTIME in the last 168 hours. Cardiac Enzymes: No results for input(s): CKTOTAL, CKMB, CKMBINDEX, TROPONINI in the last 168 hours. BNP (last 3 results) Recent Labs    02/19/18 1458  PROBNP 1,527*   HbA1C: No results for input(s): HGBA1C in the last 72 hours. CBG: No results for input(s): GLUCAP in the last 168 hours. Lipid Profile: No results for input(s): CHOL, HDL, LDLCALC, TRIG, CHOLHDL, LDLDIRECT in the last 72 hours. Thyroid Function Tests: No results for input(s):  TSH, T4TOTAL, FREET4, T3FREE, THYROIDAB in the last 72 hours. Anemia Panel: No results for input(s): VITAMINB12, FOLATE, FERRITIN, TIBC, IRON, RETICCTPCT in the last 72 hours. Urine analysis: No results found for: COLORURINE, APPEARANCEUR, LABSPEC, PHURINE, GLUCOSEU, HGBUR, BILIRUBINUR, KETONESUR, PROTEINUR, UROBILINOGEN, NITRITE, LEUKOCYTESUR Sepsis Labs: @LABRCNTIP (procalcitonin:4,lacticidven:4) )No results found for this or any previous visit (from the past 240 hour(s)).   Radiological Exams on Admission: Dg Chest 2 View  Result Date: 03/22/2018 CLINICAL DATA:  Short of breath and swelling EXAM: CHEST - 2 VIEW COMPARISON:  CT chest 12/03/2016.  Chest x-ray 12/03/2016 FINDINGS: Pulmonary vascular congestion unchanged. Progression of small bilateral effusions and bibasilar atelectasis. Negative for edema. IMPRESSION: Pulmonary vascular congestion and small pleural effusions consistent with fluid  overload. Bibasilar atelectasis. Electronically Signed   By: Franchot Gallo M.D.   On: 03/22/2018 19:40    EKG: Independently reviewed.  A. fib rate controlled low voltage.  Assessment/Plan Principal Problem:   Anasarca Active Problems:   Hypertension   Coronary artery disease   CHF (congestive heart failure) (HCC)   Acute CHF (congestive heart failure) ([place])    1. Anasarca likely from decompensated CHF -the patient was given Lasix 40 mg IV in the ER with patient having some better output at this time.  Will keep patient on Lasix 60 mg IV every 12.  Closely follow intake output metabolic panel and daily weights.  Last EF measured was in May 2019 which was showing 50 to 55%. 2. Atrial fibrillation on metoprolol and Xarelto.  If there is further worsening of creatinine may have to switch to Coumadin.  Rate presently controlled. 3. CAD denies any chest pain. 4. Elevated LFTs -likely from CHF.  Check abdominal sonogram. 5. Acute renal failure likely secondary to recent increase in diuretics  -follow metabolic panel.   DVT prophylaxis: Xarelto. Code Status: Full code. Family Communication: Family at the bedside. Disposition Plan: Home. Consults called: None. Admission status: Inpatient.   Rise Patience MD [facility] Pager [phone].  If 7PM-7AM, please contact night-coverage [email or web address] Password [facility]  03/23/2018, 4:34 AM

## 2018-03-23 NOTE — ED Notes (Signed)
Heart Healthy Diet was ordered for Lunch. 

## 2018-03-23 NOTE — Telephone Encounter (Signed)
This encounter was created in error - please disregard.

## 2018-03-23 NOTE — Telephone Encounter (Signed)
Patient's daughter called and stated that her Dad is in hospital with diastolic CHF and afib.  He is on hospitalist service and she wanted Dr. Johnsie Cancel to know he was in hospital.

## 2018-03-23 NOTE — Progress Notes (Signed)
Patient seen and examined this morning, admitted overnight by Dr. Glyn Ade, H&P reviewed and agree with the assessment and plan.  This is a pleasant 77 year old male with history of paroxysmal A. fib on anticoagulation, coronary artery disease, hyperlipidemia, diastolic CHF who presents to the hospital with chief complaint of 10 to 12 pound weight gain over the last 3 to 4 weeks, and progressive weakness.   Anasarca likely from decompensated diastolic CHF -Continue IV Lasix, strict ins and outs, daily weights  Paroxysmal A. fib -Currently in A. fib, continue metoprolol for rate control and Xarelto for anticoagulation  Coronary artery disease -Denies any chest pains  Acute kidney injury -Closely monitor with diuresis   Larry [name] M. Cruzita Lederer, MD [facility] [phone]  If 7PM-7AM, please contact night-coverage [email or web address] Password [email or web address]

## 2018-03-23 NOTE — Consult Note (Signed)
Admit date: 03/22/2018 Referring Physician  Dr. Cruzita Lederer Primary Physician  Dr Allyn Kenner Primary Cardiologist  Dr. Jenkins Rouge Reason for Consultation  CHF and afib with RVR  HPI: Larry [name] is a 77 y.o. male who is being seen today for the evaluation of atrial fibrillation with RVR and CHF at the request of Dr. Cruzita Lederer.  This is a very pleasant 77 year old male patient of Dr. Collier [facility] who has a history of chronic diastolic CHF, paroxysmal atrial fibrillation, ASA CAD status post remote PCI of the mid to distal RCA 11/1998 followed by acute ST elevation MI inferiorly 08/1999 with occlusion of the prior RCA stent as well as 70% diagonal branch.  He underwent PCI of the RCA.  He also has a history of hyperlipidemia and hypertension.    He recently has noted increased lower extremity edema as well as increased weight gain and increased abdominal girth.  His wife says he has been very fatigued.  He became alarmed when the edema started going up past his knees into her thigh his thighs and his abdomen.  He initially had been evaluated by his cardiologist and Lasix was increased to 60 mg daily and then PCP increased it further to 80 mg daily.  Despite higher doses of p.o. Lasix he continued to have lower extremity edema and started using fluid from his flank area.  He denies any chest pain or pressure.  He has had some dyspnea on exertion.  He denies any palpitations, dizziness, presyncope or syncope, PND or orthopnea.  In ER he was found to have anasarca and a chest x-ray consistent with CHF with pulmonary vascular congestion and small bilateral pleural effusions.  There is also some found to be a new onset atrial fibrillation with initially a heart rate of 90 bpm.  His last echo was 02/22/2018 showing low normal LV function with EF 50 to 55% with severe hypokinesis of the inferior lateral inferior and inferior septal myocardium consistent with prior infarct of the RCA.  Left and right atrium are  severely dilated.  Unfortunately he did not receive 2 of his doses of Lopressor in the ER while he was waiting for a bed upstairs and once he presented to 6 E. he was found to be in rapid A. fib up to 150 bpm.  He did receive his Lopressor and his heart rate now is 100 bpm resting in bed.  He received an initial dose of Lasix 40 mg IV and then subsequently placed on 60 mg IV every 12 hours.  Thus far he is put out 1.5 L.  He is resting in the bed comfortably and able to lie flat and breathe without any problems.  He says that he is not really changed his diet any recently and is really cautious about added salt in his diet but seems to eat out a lot according to his wife.    PMH:   Past Medical History:  Diagnosis Date  . Coronary artery disease    status post remote PCI of the mid to distal RCA 11/1998 followed by acute ST elevation MI inferiorly 08/1999 with occlusion of the prior RCA stent as well as 70% diagonal branch.  He underwent PCI of the RCA  . Hypercholesterolemia   . Hypertension   . Myocardial infarct ([place])   . Olecranon bursitis      PSH:   Past Surgical History:  Procedure Laterality Date  . CARDIAC CATHETERIZATION  12/15/1999   EF was 55%   .  CORONARY ANGIOPLASTY WITH STENT PLACEMENT  08/15/1999   CAD, status post prior stenting of the mid to distal RCA in 11/1998, with an acute diaphragmatic wall infarction due to total occlusion at the stent site/There is also 70% narrowing in the diagonal branch of the LAD/The LV showed inferior wall hypo- akinesis.-- Successful reperfusion, percutaneous transluminal coronary percutaneous transluminal coronary & placement of a 2nd overlying stent in RCA    Allergies:  Plavix [clopidogrel bisulfate] Prior to Admit Meds:   Medications Prior to Admission  Medication Sig Dispense Refill Last Dose  . aspirin 81 MG tablet Take 81 mg by mouth at bedtime.    03/22/2018 at Unknown time  . furosemide (LASIX) 40 MG tablet Take 1.5 tablets (60 mg  total) by mouth daily. (Patient taking differently: Take 40 mg by mouth 2 (two) times daily. )   03/22/2018 at Unknown time  . metoprolol tartrate (LOPRESSOR) 50 MG tablet Take 75 mg by mouth 2 (two) times daily.   03/22/2018 at 1000  . nitroGLYCERIN (NITROSTAT) 0.4 MG SL tablet Place 1 tablet (0.4 mg total) under the tongue every 5 (five) minutes as needed for chest pain (3 doses max). 25 tablet 3 unknown  . potassium chloride (K-DUR,KLOR-CON) 10 MEQ tablet Take 2 tablets (20 mEq total) by mouth daily. (Patient taking differently: Take 10 mEq by mouth 2 (two) times daily. ) 180 tablet 3 03/22/2018 at Unknown time  . pravastatin (PRAVACHOL) 40 MG tablet Take 1 tablet (40 mg total) by mouth every evening. 90 tablet 2 03/21/2018 at Unknown time  . rivaroxaban (XARELTO) 20 MG TABS tablet Take 1 tablet (20 mg total) by mouth daily with supper. 30 tablet 6 03/22/2018 at 1400  . metoprolol tartrate (LOPRESSOR) 100 MG tablet Take 1 tablet (100 mg total) by mouth 2 (two) times daily. (Patient not taking: Reported on 03/22/2018) 180 tablet 3 Not Taking at Unknown time   Fam HX:    Family History  Problem Relation Age of Onset  . Heart Problems Mother   . Cancer Father    Social HX:    Social History   Socioeconomic History  . Marital status: Married    Spouse name: scarlett  . Number of children: 3  . Years of education: 48  . Highest education level: Not on file  Occupational History  . Occupation: retired  Scientific laboratory technician  . Financial resource strain: Not on file  . Food insecurity:    Worry: Not on file    Inability: Not on file  . Transportation needs:    Medical: Not on file    Non-medical: Not on file  Tobacco Use  . Smoking status: Former Smoker    Types: Cigarettes    Last attempt to quit: 11/02/1999    Years since quitting: 18.4  . Smokeless tobacco: Never Used  Substance and Sexual Activity  . Alcohol use: No  . Drug use: No  . Sexual activity: Not on file  Lifestyle  . Physical  activity:    Days per week: Not on file    Minutes per session: Not on file  . Stress: Not on file  Relationships  . Social connections:    Talks on phone: Not on file    Gets together: Not on file    Attends religious service: Not on file    Active member of club or organization: Not on file    Attends meetings of clubs or organizations: Not on file    Relationship status: Not  on file  . Intimate partner violence:    Fear of current or ex partner: Not on file    Emotionally abused: Not on file    Physically abused: Not on file    Forced sexual activity: Not on file  Other Topics Concern  . Not on file  Social History Narrative  . Not on file     ROS:  All  ROS were addressed and are negative except what is stated in the HPI  Physical Exam: Blood pressure 136/89, pulse (!) 102, temperature 98.4 F (36.9 C), temperature source Oral, resp. rate 16, weight 206 lb (93.4 kg), SpO2 99 %.    General: Well developed, well nourished, in no acute distress Head: Eyes PERRLA, No xanthomas.   Normal cephalic and atramatic  Lungs: Creased breath sounds at the bases with crackles Heart:   Irregularly irregular S1 S2 Pulses are 2+ & equal.            No carotid bruit. No JVD.  No abdominal bruits. No femoral bruits. Abdomen: Bowel sounds are positive, abdomen soft and non-tender without masses or                  Hernia's noted. Msk:  Back normal, normal gait. Normal strength and tone for age. Extremities: 3+ pitting edema up to the knees bilaterally Neuro: Alert and oriented X 3. Psych:  Good affect, responds appropriately  Labs:   Lab Results  Component Value Date   WBC 11.3 (H) 03/23/2018   HGB 15.8 03/23/2018   HCT 51.3 03/23/2018   MCV 88.0 03/23/2018   PLT 257 03/23/2018    Recent Labs  Lab 03/22/18 1835 03/23/18 0451  NA 134* 133*  K 5.2* 3.8  CL 95* 94*  CO2 27 27  BUN 32* 29*  CREATININE 1.53* 1.34*  CALCIUM 9.0 8.8*  PROT 6.3*  --   BILITOT 3.0*  --   ALKPHOS  137*  --   ALT 39  --   AST 57*  --   GLUCOSE 102* 187*   No results found for: PTT No results found for: INR, PROTIME Lab Results  Component Value Date   TROPONINI <0.03 12/03/2016     Lab Results  Component Value Date   CHOL 139 04/12/2016   CHOL 134 09/01/2015   CHOL 145 08/28/2014   Lab Results  Component Value Date   HDL 37 (L) 04/12/2016   HDL 25 (L) 09/01/2015   HDL 31.00 (L) 08/28/2014   Lab Results  Component Value Date   LDLCALC 77 04/12/2016   LDLCALC 79 09/01/2015   LDLCALC 91 08/28/2014   Lab Results  Component Value Date   TRIG 127 04/12/2016   TRIG 152 (H) 09/01/2015   TRIG 115.0 08/28/2014   Lab Results  Component Value Date   CHOLHDL 3.8 04/12/2016   CHOLHDL 5.4 (H) 09/01/2015   CHOLHDL 5 08/28/2014   Lab Results  Component Value Date   LDLDIRECT 144.3 08/15/2013      Radiology:  Dg Chest 2 View  Result Date: 03/22/2018 CLINICAL DATA:  Short of breath and swelling EXAM: CHEST - 2 VIEW COMPARISON:  CT chest 12/03/2016.  Chest x-ray 12/03/2016 FINDINGS: Pulmonary vascular congestion unchanged. Progression of small bilateral effusions and bibasilar atelectasis. Negative for edema. IMPRESSION: Pulmonary vascular congestion and small pleural effusions consistent with fluid overload. Bibasilar atelectasis. Electronically Signed   By: Franchot Gallo M.D.   On: 03/22/2018 19:40   US Abdomen Complete  Result  Date: 03/23/2018 CLINICAL DATA:  Elevated LFTs EXAM: ABDOMEN ULTRASOUND COMPLETE COMPARISON:  None. FINDINGS: Gallbladder: No gallstones or wall thickening visualized. No sonographic Murphy sign noted by sonographer. Common bile duct: Diameter: Normal caliber, 3 mm Liver: Increased echotexture compatible with fatty infiltration. No focal abnormality or biliary ductal dilatation. Portal vein is patent on color Doppler imaging with normal direction of blood flow towards the liver. IVC: No abnormality visualized. Pancreas: Visualized portion unremarkable.  Spleen: Size and appearance within normal limits. Right Kidney: Length: 11.2 cm. Shadowing calcification in the upper pole measures 2.6 cm. This could be large shadowing stone or calcified renal lesion. No hydronephrosis. Left Kidney: Length: 9.2 cm. Echogenicity within normal limits. No mass or hydronephrosis visualized. Abdominal aorta: No aneurysm visualized. Other findings: Bilateral pleural effusions noted. Small amount of ascites adjacent to the spleen. IMPRESSION: Fatty infiltration of the liver. Bilateral effusions and trace perisplenic ascites. 2.6 cm calcified structure in the right upper kidney. It is difficult to determine if this is a calcified renal lesion or large nonobstructing stone. This could be further characterized with elective CT with and without contrast. Electronically Signed   By: Rolm Baptise M.D.   On: 03/23/2018 08:21     Telemetry    Atrial fibrillation with heart rate 90 bpm- Personally Reviewed  ECG    Atrial fibrillation with anterior septal infarct - Personally Reviewed   ASSESSMENT/PLAN:   1.  Acute on chronic diastolic CHF -? whether this is related to dietary indiscretion with sodium as he eats out a lot -He is diuresing well on Lasix 60 mg IV twice daily and continue this dose for now -Follow strict I's and O's and daily weights -2D echocardiogram a month ago showed low normal LV function with EF 50 to 55%  2.  Persistent atrial fibrillation with RVR -He has declined cardioversion in the past -Heart rate was fairly well controlled on presentation but then this evening when up in the 150s likely because he had missed 2 doses of his Lopressor 75 mg twice daily -Heart rate is now down in the 90s after getting Lopressor this evening. -Continue Lopressor 75 mg twice daily and increase as needed for rate control. -Continue Xarelto 20 mg daily.  3.  Hypertension -BP is well controlled on exam. -Continue Lopressor 75 mg twice daily  4.   Hyperlipidemia -Continue pravastatin 40 mg daily     Fransico Him, MD  03/23/2018  11:13 PM

## 2018-03-23 NOTE — ED Provider Notes (Signed)
[facility] [facility] EMERGENCY DEPARTMENT Provider Note   CSN: [id number] Arrival date & time: 03/22/18  1751     History   Chief Complaint Chief Complaint  Patient presents with  . Leg Swelling  . Shortness of Breath  . Fatigue    HPI Larry [name] is a 77 y.o. male.  The history is provided by the patient.  He has history of hypertension, hyperlipidemia, coronary artery disease, persistent atrial fibrillation anticoagulated on rivaroxaban and comes in because of progressive swelling in his legs, and fluid weeping from his abdomen.  For approximately the last 3-4 weeks, he has had difficulty with increasing leg edema.  He has increased his furosemide from 20 mg twice a day to 60 mg a day to 80 mg a day (40 mg twice a day), but he is not urinating in spite of this.  He has developed exertional dyspnea where if he walks about 30 feet, he starts to get short of breath.  He denies chest pain, heaviness, tightness, pressure.  Denies nausea, vomiting, diaphoresis.  Today, he started having fluid weeping from his abdomen which got him concerned and caused him to come to the ED.  Past Medical History:  Diagnosis Date  . Coronary artery disease   . Hypercholesterolemia   . Hypertension   . Myocardial infarct ([place])   . Olecranon bursitis     Patient Active Problem List   Diagnosis Date Noted  . Acute CHF ([place]) 02/19/2018  . Olecranon bursitis   . Myocardial infarct ([place])   . Hypertension   . Hypercholesterolemia   . Coronary artery disease   . Atrial flutter ([place]) 11/11/2012  . HYPERCHOLESTEROLEMIA 11/04/2009  . Coronary atherosclerosis 11/04/2009    Past Surgical History:  Procedure Laterality Date  . CARDIAC CATHETERIZATION  12/15/1999   EF was 55%   . CORONARY ANGIOPLASTY WITH STENT PLACEMENT  08/15/1999   CAD, status post prior stenting of the mid to distal RCA in 11/1998, with an acute diaphragmatic wall infarction due to total occlusion at the stent site/There  is also 70% narrowing in the diagonal branch of the LAD/The LV showed inferior wall hypo- akinesis.-- Successful reperfusion, percutaneous transluminal coronary percutaneous transluminal coronary & placement of a 2nd overlying stent in RCA        Home Medications    Prior to Admission medications   Medication Sig Start Date End Date Taking? Authorizing Provider  aspirin 81 MG tablet Take 81 mg by mouth at bedtime.    Yes [provider]  furosemide (LASIX) 40 MG tablet Take 1.5 tablets (60 mg total) by mouth daily. Patient taking differently: Take 40 mg by mouth 2 (two) times daily.  02/21/18  Yes Imogene Burn, PA-C  metoprolol tartrate (LOPRESSOR) 50 MG tablet Take 75 mg by mouth 2 (two) times daily.   Yes [provider]  nitroGLYCERIN (NITROSTAT) 0.4 MG SL tablet Place 1 tablet (0.4 mg total) under the tongue every 5 (five) minutes as needed for chest pain (3 doses max). 02/19/18  Yes Imogene Burn, PA-C  potassium chloride (K-DUR,KLOR-CON) 10 MEQ tablet Take 2 tablets (20 mEq total) by mouth daily. Patient taking differently: Take 10 mEq by mouth 2 (two) times daily.  03/11/18  Yes Josue Hector, MD  pravastatin (PRAVACHOL) 40 MG tablet Take 1 tablet (40 mg total) by mouth every evening. 12/24/17  Yes Josue Hector, MD  rivaroxaban (XARELTO) 20 MG TABS tablet Take 1 tablet (20 mg total) by mouth  daily with supper. 10/10/17  Yes Josue Hector, MD  metoprolol tartrate (LOPRESSOR) 100 MG tablet Take 1 tablet (100 mg total) by mouth 2 (two) times daily. Patient not taking: Reported on 03/22/2018 02/19/18   Imogene Burn, PA-C    Family History Family History  Problem Relation Age of Onset  . Heart Problems Mother   . Cancer Father     Social History Social History   Tobacco Use  . Smoking status: Former Smoker    Types: Cigarettes    Last attempt to quit: 11/02/1999    Years since quitting: 18.4  . Smokeless tobacco: Never Used  Substance Use Topics  .  Alcohol use: No  . Drug use: No     Allergies   Plavix [clopidogrel bisulfate]   Review of Systems Review of Systems  All other systems reviewed and are negative.    Physical Exam Updated Vital Signs BP (!) 108/91 (BP Location: Right Arm)   Pulse 85   Temp 97.9 F (36.6 C) (Oral)   Resp 18   Wt 93.4 kg (206 lb)   SpO2 99%   BMI 27.94 kg/m   Physical Exam  Nursing note and vitals reviewed.  77 year old male, resting comfortably and in no acute distress. Vital signs are normal. Oxygen saturation is 99%, which is normal. Head is normocephalic and atraumatic. PERRLA, EOMI. Oropharynx is clear. Neck is nontender and supple without adenopathy or JVD. Back is nontender and there is no CVA tenderness.  2-3+ presacral edema present. Lungs are clear without rales, wheezes, or rhonchi. Chest is nontender. Heart has regular rate and rhythm without murmur. Abdomen is soft, flat, nontender without masses or hepatosplenomegaly and peristalsis is normoactive.  Anasarca present with 1+ pitting edema of the abdomen. Extremities have 2-3+ pretibial and pedal edema with pitting edema also present in the thighs, full range of motion is present.   Skin is warm and dry without rash. Neurologic: Mental status is normal, cranial nerves are intact, there are no motor or sensory deficits.  ED Treatments / Results  Labs (all labs ordered are listed, but only abnormal results are displayed) Labs Reviewed  BASIC METABOLIC PANEL - Abnormal; Notable for the following components:      Result Value   Sodium 134 (*)    Potassium 5.2 (*)    Chloride 95 (*)    Glucose, Bld 102 (*)    BUN 32 (*)    Creatinine, Ser 1.53 (*)    GFR calc non Af Amer 43 (*)    GFR calc Af Amer 50 (*)    All other components within normal limits  CBC - Abnormal; Notable for the following components:   WBC 10.7 (*)    RDW 16.7 (*)    All other components within normal limits  BRAIN NATRIURETIC PEPTIDE - Abnormal;  Notable for the following components:   B Natriuretic Peptide 468.2 (*)    All other components within normal limits  HEPATIC FUNCTION PANEL - Abnormal; Notable for the following components:   Total Protein 6.3 (*)    Albumin 3.0 (*)    AST 57 (*)    Alkaline Phosphatase 137 (*)    Total Bilirubin 3.0 (*)    Bilirubin, Direct 1.2 (*)    Indirect Bilirubin 1.8 (*)    All other components within normal limits  I-STAT TROPONIN, ED    EKG EKG Interpretation  Date/Time:  Friday March 22 2018 18:45:01 EDT Ventricular Rate:  93  PR Interval:    QRS Duration: 96 QT Interval:  448 QTC Calculation: 557 R Axis:   104 Text Interpretation:  ** Critical Test Result: Long QTc Atrial fibrillation Rightward axis Low voltage QRS Septal infarct , age undetermined ST & T wave abnormality, consider inferior ischemia Abnormal ECG When compared with ECG of 01/01/2017, QT has lengthened Confirmed by Delora Fuel ([id number]) on 03/23/2018 12:42:00 AM   Radiology Dg Chest 2 View  Result Date: 03/22/2018 CLINICAL DATA:  Short of breath and swelling EXAM: CHEST - 2 VIEW COMPARISON:  CT chest 12/03/2016.  Chest x-ray 12/03/2016 FINDINGS: Pulmonary vascular congestion unchanged. Progression of small bilateral effusions and bibasilar atelectasis. Negative for edema. IMPRESSION: Pulmonary vascular congestion and small pleural effusions consistent with fluid overload. Bibasilar atelectasis. Electronically Signed   By: Franchot Gallo M.D.   On: 03/22/2018 19:40    Procedures Procedures  Medications Ordered in ED Medications  furosemide (LASIX) injection 40 mg (40 mg Intravenous Given 03/23/18 0146)     Initial Impression / Assessment and Plan / ED Course  I have reviewed the triage vital signs and the nursing notes.  Pertinent labs & imaging results that were available during my care of the patient were reviewed by me and considered in my medical decision making (see chart for details).  CHF exacerbation.   Heart failure is probably diastolic.  Old records are reviewed, and recent echocardiogram showed ejection fraction of 55-60%, but unable to assess for diastolic dysfunction.  He has failed to respond to creasing doses of furosemide as an outpatient.  Labs today do show an increase in his BNP over baseline.  However, more importantly, evidence of acute kidney injury with creatinine to 1.53, with baseline creatinine 0.94 on May 14, 1.12 on May 23.  He will be given intravenous furosemide, will need to be admitted for careful diuresis and monitoring renal function.  Case is discussed with Dr. Hal Hope of [facility], who agrees to admit the patient.  Hepatic function panel has come back with albumin 3.0.  This may be contributing to his difficulty in obtaining adequate diuresis.  Elevated bilirubin noted of uncertain cause.  CHA2DS2/VAS Stroke Risk Points  Current as of 5 minutes ago     5 >= 2 Points: High Risk  1 - 1.99 Points: Medium Risk  0 Points: Low Risk    The previous score was 4 on 01/02/2018.:  Last Change:     Details    This score determines the patient's risk of having a stroke if the  patient has atrial fibrillation.       Points Metrics  1 Has Congestive Heart Failure:  Yes    Current as of 5 minutes ago  1 Has Vascular Disease:  Yes    Current as of 5 minutes ago  1 Has Hypertension:  Yes    Current as of 5 minutes ago  2 Age:  72    Current as of 5 minutes ago  0 Has Diabetes:  No    Current as of 5 minutes ago  0 Had Stroke:  No  Had TIA:  No  Had thromboembolism:  No    Current as of 5 minutes ago  0 Male:  No    Current as of 5 minutes ago   Final Clinical Impressions(s) / ED Diagnoses   Final diagnoses:  Acute on chronic diastolic heart failure (HCC)  Acute kidney injury (nontraumatic) (HCC)  Chronic atrial fibrillation (HCC)  Serum total bilirubin elevated  ED Discharge Orders    None       Delora Fuel, MD 58/09/98 [phone]

## 2018-03-23 NOTE — Discharge Instructions (Addendum)
Information on my medicine - XARELTO (Rivaroxaban)  This medication education was reviewed with me or my healthcare representative as part of my discharge preparation.  The pharmacist that spoke with me during my hospital stay was:  Marney Setting, [facility]  Why was Xarelto prescribed for you? Xarelto was prescribed for you to reduce the risk of a blood clot forming that can cause a stroke if you have a medical condition called atrial fibrillation (a type of irregular heartbeat).  What do you need to know about xarelto ? Take your Xarelto ONCE DAILY at the same time every day with your evening meal. If you have difficulty swallowing the tablet whole, you may crush it and mix in applesauce just prior to taking your dose.     Take Xarelto exactly as prescribed by your doctor and DO NOT stop taking Xarelto without talking to the doctor who prescribed the medication.  Stopping without other stroke prevention medication to take the place of Xarelto may increase your risk of developing a clot that causes a stroke.  Refill your prescription before you run out.  After discharge, you should have regular check-up appointments with your healthcare provider that is prescribing your Xarelto.  In the future your dose may need to be changed if your kidney function or weight changes by a significant amount.  What do you do if you miss a dose? If you are taking Xarelto ONCE DAILY and you miss a dose, take it as soon as you remember on the same day then continue your regularly scheduled once daily regimen the next day. Do not take two doses of Xarelto at the same time or on the same day.   Important Safety Information A possible side effect of Xarelto is bleeding. You should call your healthcare provider right away if you experience any of the following: ? Bleeding from an injury or your nose that does not stop. ? Unusual colored urine (red or dark brown) or unusual colored stools (red or  black). ? Unusual bruising for unknown reasons. ? A serious fall or if you hit your head (even if there is no bleeding).  Some medicines may interact with Xarelto and might increase your risk of bleeding while on Xarelto. To help avoid this, consult your healthcare provider or pharmacist prior to using any new prescription or non-prescription medications, including herbals, vitamins, non-steroidal anti-inflammatory drugs (NSAIDs) and supplements.  This website has more information on Xarelto: [email or web address]   We scheduled you with Dr. Meda Coffee on 07/25/18 at 11:00AM but see her PA on July 2nd.  For close foll

## 2018-03-23 NOTE — ED Notes (Signed)
Pharmacy notified IV infiltrated, pharmacy denied any special needs with IV infiltration with lasix and that it should be absorbed IM. Pharmacy did note that an additional 20 of lasix may be given. Will continue to monitor.

## 2018-03-24 LAB — BASIC METABOLIC PANEL
Anion gap: 10 (ref 5–15)
BUN: 21 mg/dL — AB (ref 6–20)
CALCIUM: 8.6 mg/dL — AB (ref 8.9–10.3)
CO2: 30 mmol/L (ref 22–32)
CREATININE: 1.14 mg/dL (ref 0.61–1.24)
Chloride: 96 mmol/L — ABNORMAL LOW (ref 101–111)
GFR calc Af Amer: >60 mL/min (ref >60)
GLUCOSE: 118 mg/dL — AB (ref 65–99)
POTASSIUM: 3.4 mmol/L — AB (ref 3.5–5.1)
SODIUM: 136 mmol/L (ref 135–145)

## 2018-03-24 NOTE — Progress Notes (Signed)
PROGRESS NOTE  Larry [name] QPY:[id number] DOB: [birth year] DOA: 03/22/2018 PCP: Celene Squibb, MD   LOS: 1 day   Brief Narrative / Interim history: 77 year old male with history of paroxysmal A. fib on chronic anticoagulation, coronary artery disease, hyperlipidemia, diastolic CHF who came to the hospital on 6/15 with progressive swelling in his legs and increasing his abdominal girth over the last 3 to 4 weeks.  He was found to be in heart failure and have signs of fluid overload, he was placed on IV diuresis and was admitted to the hospital.  Cardiology was consulted.  Assessment & Plan: Principal Problem:   Anasarca Active Problems:   Hypertension   Coronary artery disease   CHF (congestive heart failure) (HCC)   Acute CHF (congestive heart failure) (HCC)   Acute diastolic CHF (congestive heart failure) (HCC)   Atrial fibrillation with RVR (HCC)     Anasarca likely from acute on chronic diastolic CHF -Patient compliant with his medication regimen, however on further discussion with him and his wife he does not seem to be strict with a low-salt diet and he is eating out occasionally. -Has been placed on IV Lasix, he is net negative almost 2 L this morning, his weight is gone down a pound compared to yesterday.  He still remains significantly swollen in his lower extremities and abdomen, continue IV diuresis  Paroxysmal A. fib with RVR -Patient in A fib on admission, missed metoprolol in the ED due to hypotension with diuresis, and developed A. fib with RVR last night.  He was resumed on his metoprolol and his heart rate has been much better controlled.  Telemetry reviewed and shows heart rate trend in the 90s to 100.  Coronary artery disease -Denies any chest pains  Acute kidney injury -Improving with diuresis, continue to monitor renal function  Hypokalemia -Due to diuresis, replete potassium  Hyperlipidemia -Continue statin   DVT prophylaxis: On Xarelto Code Status:  Full code Family Communication: wife present at bedside Disposition Plan: home when ready   Consultants:   Cardiology   Procedures:   None   Antimicrobials:  None    Subjective: -Still short of breath, appreciates ongoing lower extremity swelling slightly improved  Objective: Vitals:   03/23/18 2049 03/23/18 2111 03/24/18 0614 03/24/18 0800  BP: 136/89  111/86   Pulse:   91 86  Resp:   19 20  Temp:  98.4 F (36.9 C) 98 F (36.7 C)   TempSrc:  Oral Oral   SpO2:  99% 97% 94%  Weight:   93 kg (205 lb 1.6 oz)     Intake/Output Summary (Last 24 hours) at 03/24/2018 1248 Last data filed at 03/24/2018 0617 Gross per 24 hour  Intake 597 ml  Output 2550 ml  Net -1953 ml   Filed Weights   03/22/18 1833 03/24/18 0614  Weight: 93.4 kg (206 lb) 93 kg (205 lb 1.6 oz)    Examination:  Constitutional: NAD Eyes: PERRL, lids and conjunctivae normal ENMT: Mucous membranes are moist.  Respiratory: clear to auscultation bilaterally, no wheezing, faint bibasilar crackles. Normal respiratory effort. No accessory muscle use.  Cardiovascular: Irregularly irregular, 2+ pitting lower extremity edema, +JVD Abdomen: no tenderness. Bowel sounds positive.  Swollen/anasarca Skin: no rashes, lesions, ulcers. No induration Neurologic: CN 2-12 grossly intact. Strength 5/5 in all 4.  Psychiatric: Normal judgment and insight. Alert and oriented x 3. Normal mood.    Data Reviewed: I have independently reviewed following labs and imaging studies   CBC:  Recent Labs  Lab 03/22/18 1835 03/23/18 0451  WBC 10.7* 11.3*  HGB 15.8 15.8  HCT 50.7 51.3  MCV 88.9 88.0  PLT 275 742   Basic Metabolic Panel: Recent Labs  Lab 03/22/18 1835 03/23/18 0451 03/24/18 0438  NA 134* 133* 136  K 5.2* 3.8 3.4*  CL 95* 94* 96*  CO2 27 27 30   GLUCOSE 102* 187* 118*  BUN 32* 29* 21*  CREATININE 1.53* 1.34* 1.14  CALCIUM 9.0 8.8* 8.6*  MG  --  2.0  --    GFR: Estimated Creatinine Clearance: 61.5  mL/min (by C-G formula based on SCr of 1.14 mg/dL). Liver Function Tests: Recent Labs  Lab 03/22/18 1835  AST 57*  ALT 39  ALKPHOS 137*  BILITOT 3.0*  PROT 6.3*  ALBUMIN 3.0*   No results for input(s): LIPASE, AMYLASE in the last 168 hours. No results for input(s): AMMONIA in the last 168 hours. Coagulation Profile: No results for input(s): INR, PROTIME in the last 168 hours. Cardiac Enzymes: No results for input(s): CKTOTAL, CKMB, CKMBINDEX, TROPONINI in the last 168 hours. BNP (last 3 results) Recent Labs    02/19/18 1458  PROBNP 1,527*   HbA1C: No results for input(s): HGBA1C in the last 72 hours. CBG: No results for input(s): GLUCAP in the last 168 hours. Lipid Profile: No results for input(s): CHOL, HDL, LDLCALC, TRIG, CHOLHDL, LDLDIRECT in the last 72 hours. Thyroid Function Tests: No results for input(s): TSH, T4TOTAL, FREET4, T3FREE, THYROIDAB in the last 72 hours. Anemia Panel: No results for input(s): VITAMINB12, FOLATE, FERRITIN, TIBC, IRON, RETICCTPCT in the last 72 hours. Urine analysis:    Component Value Date/Time   COLORURINE YELLOW 03/23/2018 [place] 03/23/2018 0727   LABSPEC 1.009 03/23/2018 0727   PHURINE 5.0 03/23/2018 0727   GLUCOSEU NEGATIVE 03/23/2018 0727   HGBUR NEGATIVE 03/23/2018 0727   BILIRUBINUR NEGATIVE 03/23/2018 0727   KETONESUR NEGATIVE 03/23/2018 0727   PROTEINUR NEGATIVE 03/23/2018 0727   NITRITE NEGATIVE 03/23/2018 0727   LEUKOCYTESUR NEGATIVE 03/23/2018 0727   Sepsis Labs: Invalid input(s): PROCALCITONIN, LACTICIDVEN  No results found for this or any previous visit (from the past 240 hour(s)).    Radiology Studies: Dg Chest 2 View  Result Date: 03/22/2018 CLINICAL DATA:  Short of breath and swelling EXAM: CHEST - 2 VIEW COMPARISON:  CT chest 12/03/2016.  Chest x-ray 12/03/2016 FINDINGS: Pulmonary vascular congestion unchanged. Progression of small bilateral effusions and bibasilar atelectasis. Negative  for edema. IMPRESSION: Pulmonary vascular congestion and small pleural effusions consistent with fluid overload. Bibasilar atelectasis. Electronically Signed   By: Franchot Gallo M.D.   On: 03/22/2018 19:40   US Abdomen Complete  Result Date: 03/23/2018 CLINICAL DATA:  Elevated LFTs EXAM: ABDOMEN ULTRASOUND COMPLETE COMPARISON:  None. FINDINGS: Gallbladder: No gallstones or wall thickening visualized. No sonographic Murphy sign noted by sonographer. Common bile duct: Diameter: Normal caliber, 3 mm Liver: Increased echotexture compatible with fatty infiltration. No focal abnormality or biliary ductal dilatation. Portal vein is patent on color Doppler imaging with normal direction of blood flow towards the liver. IVC: No abnormality visualized. Pancreas: Visualized portion unremarkable. Spleen: Size and appearance within normal limits. Right Kidney: Length: 11.2 cm. Shadowing calcification in the upper pole measures 2.6 cm. This could be large shadowing stone or calcified renal lesion. No hydronephrosis. Left Kidney: Length: 9.2 cm. Echogenicity within normal limits. No mass or hydronephrosis visualized. Abdominal aorta: No aneurysm visualized. Other findings: Bilateral pleural effusions noted. Small amount of ascites adjacent  to the spleen. IMPRESSION: Fatty infiltration of the liver. Bilateral effusions and trace perisplenic ascites. 2.6 cm calcified structure in the right upper kidney. It is difficult to determine if this is a calcified renal lesion or large nonobstructing stone. This could be further characterized with elective CT with and without contrast. Electronically Signed   By: Rolm Baptise M.D.   On: 03/23/2018 08:21     Scheduled Meds: . aspirin  81 mg Oral QHS  . furosemide  60 mg Intravenous Q12H  . metoprolol tartrate  75 mg Oral BID  . potassium chloride  10 mEq Oral BID  . pravastatin  40 mg Oral QPM  . rivaroxaban  20 mg Oral Daily   Continuous Infusions:   Marzetta Board, MD,  PhD [facility] Pager [phone]  If 7PM-7AM, please contact night-coverage [email or web address] Password [facility] 03/24/2018, 12:48 PM

## 2018-03-24 NOTE — Progress Notes (Signed)
RT Note:  Patient has home CPAP equipment at bedside and is currently in use.

## 2018-03-24 NOTE — Progress Notes (Signed)
Progress Note  Patient Name: Larry [name] Date of Encounter: 03/24/2018  Primary Cardiologist: Jenkins Rouge, MD   Subjective   SOB improving.   Inpatient Medications    Scheduled Meds: . aspirin  81 mg Oral QHS  . furosemide  60 mg Intravenous Q12H  . metoprolol tartrate  75 mg Oral BID  . potassium chloride  10 mEq Oral BID  . pravastatin  40 mg Oral QPM  . rivaroxaban  20 mg Oral Daily   Continuous Infusions:  PRN Meds: acetaminophen **OR** acetaminophen, nitroGLYCERIN, ondansetron **OR** ondansetron (ZOFRAN) IV   Vital Signs    Vitals:   03/23/18 1315 03/23/18 2049 03/23/18 2111 03/24/18 0614  BP: 115/75 136/89  111/86  Pulse: (!) 102   91  Resp:    19  Temp:   98.4 F (36.9 C) 98 F (36.7 C)  TempSrc:   Oral Oral  SpO2: 100%  99% 97%  Weight:    205 lb 1.6 oz (93 kg)    Intake/Output Summary (Last 24 hours) at 03/24/2018 0913 Last data filed at 03/24/2018 0617 Gross per 24 hour  Intake 597 ml  Output 2550 ml  Net -1953 ml   Filed Weights   03/22/18 1833 03/24/18 0614  Weight: 206 lb (93.4 kg) 205 lb 1.6 oz (93 kg)    Telemetry    afib 90s-100s - Personally Reviewed  ECG    na  Physical Exam   GEN: No acute distress.   Neck: No JVD Cardiac: irreg, no murmurs, rubs, or gallops.  Respiratory: Clear to auscultation bilaterally. GI: Soft, nontender, non-distended  MS: 2+bilateral edema; No deformity. Neuro:  Nonfocal  Psych: Normal affect   Labs    Chemistry Recent Labs  Lab 03/22/18 1835 03/23/18 0451 03/24/18 0438  NA 134* 133* 136  K 5.2* 3.8 3.4*  CL 95* 94* 96*  CO2 27 27 30   GLUCOSE 102* 187* 118*  BUN 32* 29* 21*  CREATININE 1.53* 1.34* 1.14  CALCIUM 9.0 8.8* 8.6*  PROT 6.3*  --   --   ALBUMIN 3.0*  --   --   AST 57*  --   --   ALT 39  --   --   ALKPHOS 137*  --   --   BILITOT 3.0*  --   --   GFRNONAA 43* 50* >60  GFRAA 50* 58* >60  ANIONGAP 12 12 10      Hematology Recent Labs  Lab 03/22/18 1835  03/23/18 0451  WBC 10.7* 11.3*  RBC 5.70 5.83*  HGB 15.8 15.8  HCT 50.7 51.3  MCV 88.9 88.0  MCH 27.7 27.1  MCHC 31.2 30.8  RDW 16.7* 16.8*  PLT 275 257    Cardiac EnzymesNo results for input(s): TROPONINI in the last 168 hours.  Recent Labs  Lab 03/22/18 1854  TROPIPOC 0.01     BNP Recent Labs  Lab 03/22/18 1836  BNP 468.2*     DDimer No results for input(s): DDIMER in the last 168 hours.   Radiology    Dg Chest 2 View  Result Date: 03/22/2018 CLINICAL DATA:  Short of breath and swelling EXAM: CHEST - 2 VIEW COMPARISON:  CT chest 12/03/2016.  Chest x-ray 12/03/2016 FINDINGS: Pulmonary vascular congestion unchanged. Progression of small bilateral effusions and bibasilar atelectasis. Negative for edema. IMPRESSION: Pulmonary vascular congestion and small pleural effusions consistent with fluid overload. Bibasilar atelectasis. Electronically Signed   By: Franchot Gallo M.D.   On: 03/22/2018 19:40   US  Abdomen Complete  Result Date: 03/23/2018 CLINICAL DATA:  Elevated LFTs EXAM: ABDOMEN ULTRASOUND COMPLETE COMPARISON:  None. FINDINGS: Gallbladder: No gallstones or wall thickening visualized. No sonographic Murphy sign noted by sonographer. Common bile duct: Diameter: Normal caliber, 3 mm Liver: Increased echotexture compatible with fatty infiltration. No focal abnormality or biliary ductal dilatation. Portal vein is patent on color Doppler imaging with normal direction of blood flow towards the liver. IVC: No abnormality visualized. Pancreas: Visualized portion unremarkable. Spleen: Size and appearance within normal limits. Right Kidney: Length: 11.2 cm. Shadowing calcification in the upper pole measures 2.6 cm. This could be large shadowing stone or calcified renal lesion. No hydronephrosis. Left Kidney: Length: 9.2 cm. Echogenicity within normal limits. No mass or hydronephrosis visualized. Abdominal aorta: No aneurysm visualized. Other findings: Bilateral pleural effusions noted.  Small amount of ascites adjacent to the spleen. IMPRESSION: Fatty infiltration of the liver. Bilateral effusions and trace perisplenic ascites. 2.6 cm calcified structure in the right upper kidney. It is difficult to determine if this is a calcified renal lesion or large nonobstructing stone. This could be further characterized with elective CT with and without contrast. Electronically Signed   By: Rolm Baptise M.D.   On: 03/23/2018 08:21    Cardiac Studies     Patient Profile     This is a very pleasant 77 year old male patient of Dr. Collier [facility] who has a history of chronic diastolic CHF, paroxysmal atrial fibrillation, ASA CAD status post remote PCI of the mid to distal RCA 11/1998 followed by acute ST elevation MI inferiorly 08/1999 with occlusion of the prior RCA stent as well as 70% diagonal Mireyah Chervenak.  He underwent PCI of the RCA.  He also has a history of hyperlipidemia and hypertension.   Cardiology consulted for afib with RVR and CHF by Dr Cruzita Lederer.      Assessment & Plan    1. Acute on chronic diastolic HF - 11/4233 echo LVEF 50-55%, inferolateral hypokinesis, diastolic function not reported with afib - negative 1.9 L since admission. On lasix 60mg  IV bid. Downtrend in Cr with diuresis consistent with venous congestion and CHF - continue IV diuresis today  2. Afib  - from notes patient has declined cardioversion in the past - heart rates 90s to 100s. He reports low bp's on higher doses of lopressor. We will continue current dosing, rates are reasonable given limitations - continue lopressor 75mg  bid, he is on xarelto 20mg  daily for stroke prevention. On ASA as well, I defer this to his primary cardiologist Dr Johnsie Cancel  3. CAD - history of prior PCI - no acute issues  4. Hypokalemia - per primary team  For questions or updates, please contact [place] Please consult [email or web address] for contact info under Cardiology/STEMI.      Merrily Pew, MD  03/24/2018,  9:13 AM

## 2018-03-25 LAB — BASIC METABOLIC PANEL
Anion gap: 11 (ref 5–15)
BUN: 19 mg/dL (ref 6–20)
CHLORIDE: 94 mmol/L — AB (ref 101–111)
CO2: 30 mmol/L (ref 22–32)
Calcium: 8.8 mg/dL — ABNORMAL LOW (ref 8.9–10.3)
Creatinine, Ser: 1.08 mg/dL (ref 0.61–1.24)
GFR calc Af Amer: >60 mL/min (ref >60)
GFR calc non Af Amer: >60 mL/min (ref >60)
GLUCOSE: 122 mg/dL — AB (ref 65–99)
POTASSIUM: 3.3 mmol/L — AB (ref 3.5–5.1)
Sodium: 135 mmol/L (ref 135–145)

## 2018-03-25 MED ORDER — FUROSEMIDE 10 MG/ML IJ SOLN
80.0000 mg | Freq: Three times a day (TID) | INTRAMUSCULAR | Status: DC
  Administered 2018-03-25 – 2018-03-26 (×3): 80 mg via INTRAVENOUS
  Filled 2018-03-25 (×3): qty 8

## 2018-03-25 MED ORDER — FUROSEMIDE 10 MG/ML IJ SOLN
80.0000 mg | Freq: Two times a day (BID) | INTRAMUSCULAR | Status: DC
  Administered 2018-03-25: 80 mg via INTRAVENOUS
  Filled 2018-03-25: qty 8

## 2018-03-25 MED ORDER — POTASSIUM CHLORIDE CRYS ER 20 MEQ PO TBCR
60.0000 meq | EXTENDED_RELEASE_TABLET | Freq: Once | ORAL | Status: AC
  Administered 2018-03-25: 60 meq via ORAL
  Filled 2018-03-25: qty 3

## 2018-03-25 NOTE — Progress Notes (Signed)
PROGRESS NOTE  Reason Helzer QQV:[id number] DOB: [birth year] DOA: 03/22/2018 PCP: Celene Squibb, MD   LOS: 2 days   Brief Narrative / Interim history: 77 year old male with history of paroxysmal A. fib on chronic anticoagulation, coronary artery disease, hyperlipidemia, diastolic CHF who came to the hospital on 6/15 with progressive swelling in his legs and increasing his abdominal girth over the last 3 to 4 weeks.  He was found to be in heart failure and have signs of fluid overload, he was placed on IV diuresis and was admitted to the hospital.  Cardiology was consulted.  Assessment & Plan: Principal Problem:   Anasarca Active Problems:   Hypertension   Coronary artery disease   CHF (congestive heart failure) (HCC)   Acute CHF (congestive heart failure) (HCC)   Acute diastolic CHF (congestive heart failure) (HCC)   Atrial fibrillation with RVR (HCC)   Anasarca likely from acute on chronic diastolic CHF -Patient compliant with his medication regimen, however on further discussion with him and his wife he does not seem to be strict with a low-salt diet and he is eating out occasionally. -Continue IV Lasix, he is net -2.7 L and down total of 3 pounds.  He is having diuresis however I would favor increasing the Lasix from 60 to 80 mg.  His renal function has remained stable  Paroxysmal A. fib with RVR -Patient in A fib on admission, missed metoprolol in the ED due to hypotension with diuresis, and developed A. fib with RVR last night.  He was resumed on his metoprolol and his heart rate has been much better controlled.  -Telemetry reviewed and heart rate stays in the 90s to 100  Coronary artery disease -No chest pain, able to get up and ambulate in the room without discomfort  Acute kidney injury -Kidney function is now close to normal, continue to monitor  Hypokalemia -Continues to need repletion today, repeat tomorrow morning  Hyperlipidemia -Continue statin   DVT  prophylaxis: On Xarelto Code Status: Full code Family Communication: wife present at bedside Disposition Plan: home when ready   Consultants:   Cardiology   Procedures:   None   Antimicrobials:  None    Subjective: -He is feeling better, appreciates his abdominal swelling is improved as well as his legs, denies any chest pain, denies any shortness of breath, no abdominal pain nausea or vomiting  Objective: Vitals:   03/24/18 1354 03/24/18 2138 03/24/18 2241 03/25/18 0548  BP: 104/82 (!) 112/91 109/85 106/85  Pulse: 86 99 90 84  Resp: (!) 27 20  (!) 22  Temp: 98.3 F (36.8 C) 99.2 F (37.3 C)    TempSrc: Oral Oral    SpO2: 98% 96%  95%  Weight:    92.4 kg (203 lb 11.2 oz)  Height:    6' (1.829 m)    Intake/Output Summary (Last 24 hours) at 03/25/2018 1209 Last data filed at 03/25/2018 1056 Gross per 24 hour  Intake 720 ml  Output 1725 ml  Net -1005 ml   Filed Weights   03/22/18 1833 03/24/18 0614 03/25/18 0548  Weight: 93.4 kg (206 lb) 93 kg (205 lb 1.6 oz) 92.4 kg (203 lb 11.2 oz)    Examination:  Constitutional: NAD Eyes: No scleral icterus ENMT: Moist mucous membranes Respiratory: Clear to auscultation bilaterally, no wheezing, faint bibasilar crackles.  Normal respiratory effort Cardiovascular: Irregularly irregular, 1+ pitting lower extremity edema Abdomen: Soft, nontender, nondistended, bowel sounds positive.  Swelling improved Skin: No rashes Neurologic: Nonfocal Psychiatric:  Normal mood and affect   Data Reviewed: I have independently reviewed following labs and imaging studies   CBC: Recent Labs  Lab 03/22/18 1835 03/23/18 0451  WBC 10.7* 11.3*  HGB 15.8 15.8  HCT 50.7 51.3  MCV 88.9 88.0  PLT 275 993   Basic Metabolic Panel: Recent Labs  Lab 03/22/18 1835 03/23/18 0451 03/24/18 0438 03/25/18 0405  NA 134* 133* 136 135  K 5.2* 3.8 3.4* 3.3*  CL 95* 94* 96* 94*  CO2 27 27 30 30   GLUCOSE 102* 187* 118* 122*  BUN 32* 29* 21* 19    CREATININE 1.53* 1.34* 1.14 1.08  CALCIUM 9.0 8.8* 8.6* 8.8*  MG  --  2.0  --   --    GFR: Estimated Creatinine Clearance: 64.9 mL/min (by C-G formula based on SCr of 1.08 mg/dL). Liver Function Tests: Recent Labs  Lab 03/22/18 1835  AST 57*  ALT 39  ALKPHOS 137*  BILITOT 3.0*  PROT 6.3*  ALBUMIN 3.0*   No results for input(s): LIPASE, AMYLASE in the last 168 hours. No results for input(s): AMMONIA in the last 168 hours. Coagulation Profile: No results for input(s): INR, PROTIME in the last 168 hours. Cardiac Enzymes: No results for input(s): CKTOTAL, CKMB, CKMBINDEX, TROPONINI in the last 168 hours. BNP (last 3 results) Recent Labs    02/19/18 1458  PROBNP 1,527*   HbA1C: No results for input(s): HGBA1C in the last 72 hours. CBG: No results for input(s): GLUCAP in the last 168 hours. Lipid Profile: No results for input(s): CHOL, HDL, LDLCALC, TRIG, CHOLHDL, LDLDIRECT in the last 72 hours. Thyroid Function Tests: No results for input(s): TSH, T4TOTAL, FREET4, T3FREE, THYROIDAB in the last 72 hours. Anemia Panel: No results for input(s): VITAMINB12, FOLATE, FERRITIN, TIBC, IRON, RETICCTPCT in the last 72 hours. Urine analysis:    Component Value Date/Time   COLORURINE YELLOW 03/23/2018 [place] 03/23/2018 0727   LABSPEC 1.009 03/23/2018 0727   PHURINE 5.0 03/23/2018 0727   GLUCOSEU NEGATIVE 03/23/2018 0727   HGBUR NEGATIVE 03/23/2018 0727   BILIRUBINUR NEGATIVE 03/23/2018 0727   KETONESUR NEGATIVE 03/23/2018 0727   PROTEINUR NEGATIVE 03/23/2018 0727   NITRITE NEGATIVE 03/23/2018 0727   LEUKOCYTESUR NEGATIVE 03/23/2018 0727   Sepsis Labs: Invalid input(s): PROCALCITONIN, LACTICIDVEN  No results found for this or any previous visit (from the past 240 hour(s)).    Radiology Studies: No results found.   Scheduled Meds: . aspirin  81 mg Oral QHS  . furosemide  80 mg Intravenous Q8H  . metoprolol tartrate  75 mg Oral BID  . pravastatin   40 mg Oral QPM  . rivaroxaban  20 mg Oral Daily   Continuous Infusions:   Marzetta Board, MD, PhD [facility] Pager [phone]  If 7PM-7AM, please contact night-coverage [email or web address] Password [facility] 03/25/2018, 12:09 PM

## 2018-03-25 NOTE — Progress Notes (Addendum)
Progress Note  Patient Name: Larry [name] Date of Encounter: 03/25/2018  Primary Cardiologist: Jenkins Rouge, MD   Subjective   Feels better today. Edema improved but still present. Denies CP. No palpitations.   Inpatient Medications    Scheduled Meds: . aspirin  81 mg Oral QHS  . furosemide  80 mg Intravenous Q12H  . metoprolol tartrate  75 mg Oral BID  . pravastatin  40 mg Oral QPM  . rivaroxaban  20 mg Oral Daily   Continuous Infusions:  PRN Meds: acetaminophen **OR** acetaminophen, nitroGLYCERIN, ondansetron **OR** ondansetron (ZOFRAN) IV   Vital Signs    Vitals:   03/24/18 1354 03/24/18 2138 03/24/18 2241 03/25/18 0548  BP: 104/82 (!) 112/91 109/85 106/85  Pulse: 86 99 90 84  Resp: (!) 27 20  (!) 22  Temp: 98.3 F (36.8 C) 99.2 F (37.3 C)    TempSrc: Oral Oral    SpO2: 98% 96%  95%  Weight:    203 lb 11.2 oz (92.4 kg)  Height:    6' (1.829 m)    Intake/Output Summary (Last 24 hours) at 03/25/2018 0919 Last data filed at 03/25/2018 0548 Gross per 24 hour  Intake 720 ml  Output 1725 ml  Net -1005 ml   Filed Weights   03/22/18 1833 03/24/18 0614 03/25/18 0548  Weight: 206 lb (93.4 kg) 205 lb 1.6 oz (93 kg) 203 lb 11.2 oz (92.4 kg)    Telemetry     atrial fibrillation in the 80s-90s - Personally Reviewed  ECG    Atrial fibrillation 96 bpm 03/23/18  - Personally Reviewed  Physical Exam   GEN: No acute distress.   Neck: No JVD Cardiac: irreguarlly irregular rhythm, controlled rate, no murmurs, rubs, or gallops.  Respiratory: decreased BS bilaterally, R>L. GI: Soft, nontender, non-distended  MS: 2+ bilateral LEE pitting edema  Neuro:  Nonfocal  Psych: Normal affect   Labs    Chemistry Recent Labs  Lab 03/22/18 1835 03/23/18 0451 03/24/18 0438 03/25/18 0405  NA 134* 133* 136 135  K 5.2* 3.8 3.4* 3.3*  CL 95* 94* 96* 94*  CO2 27 27 30 30   GLUCOSE 102* 187* 118* 122*  BUN 32* 29* 21* 19  CREATININE 1.53* 1.34* 1.14 1.08  CALCIUM 9.0  8.8* 8.6* 8.8*  PROT 6.3*  --   --   --   ALBUMIN 3.0*  --   --   --   AST 57*  --   --   --   ALT 39  --   --   --   ALKPHOS 137*  --   --   --   BILITOT 3.0*  --   --   --   GFRNONAA 43* 50* >60 >60  GFRAA 50* 58* >60 >60  ANIONGAP 12 12 10 11      Hematology Recent Labs  Lab 03/22/18 1835 03/23/18 0451  WBC 10.7* 11.3*  RBC 5.70 5.83*  HGB 15.8 15.8  HCT 50.7 51.3  MCV 88.9 88.0  MCH 27.7 27.1  MCHC 31.2 30.8  RDW 16.7* 16.8*  PLT 275 257    Cardiac EnzymesNo results for input(s): TROPONINI in the last 168 hours.  Recent Labs  Lab 03/22/18 1854  TROPIPOC 0.01     BNP Recent Labs  Lab 03/22/18 1836  BNP 468.2*     DDimer No results for input(s): DDIMER in the last 168 hours.   Radiology    No results found.  Cardiac Studies   02/22/18  Study Conclusions  - Left ventricle: The cavity size was normal. Wall thickness was   normal. Systolic function was at the lower limits of normal. The   estimated ejection fraction was in the range of 50% to 55%.   Severe hypokinesis of the basalinferolateral, inferior, and   inferoseptal myocardium; consistent with infarction in the   distribution of the right coronary artery. - Mitral valve: Calcified annulus. - Left atrium: The atrium was severely dilated. - Right atrium: The atrium was severely dilated. - Pulmonary arteries: Systolic pressure was mildly increased. PA   peak pressure: 33 mm Hg (S). - Pericardium, extracardiac: A small pericardial effusion was   identified circumferential to the heart. There was no evidence of   hemodynamic compromise.   Patient Profile     77 y/o male with a history of chronic diastolic CHF, paroxysmal atrial fibrillation, CAD status post remote PCI of the mid to distal RCA in  Feb 2000 followed by acute ST elevation MI inferiorly in Nov 2000 with occlusion of the prior RCA stent as well as 70% diagonal branch. He underwent PCI of the RCA. He also has a history of  hyperlipidemia and hypertension. Cardiology consulted this admit for afib w/ RVR and CHF, at the request of Dr. Cruzita Lederer, Internal Medicine.   Assessment & Plan    1. Acute on Chronic Diastolic HF: acute exacerbation likely secondary to dietary indiscretion with sodium, as pt notes that he eats out a lot. Recent 2D echo 02/2018 showed normal LVEF at 50-55%. Treating with IV Lasix. He has a foley. 1.7L out in past 24 hrs. Net I/os negative 2.7 L since admit. Renal function stable. Kdur given for hypokalemia. He remains fluid overloaded with significant bilateral LEE on exam and decreased BS on the right. Continue IV Lasix, 80 mg BID. Low sodium diet.   2. Persistent Atrial Fibrillation: rate controlled in the 80s-90s. Per notes, pt has declined cardioversion in the past. He reports low bp's on higher doses of lopressor. We will continue current dosing, as rates are reasonable given limitations. Continue Xarelto for anticoagulation.   3. CAD: h/o PCI and stenting of the RCA in 2000. Denies CP. Continue medical therapy.   4. Hypokalemia: 3.3 today. He received 60 meq of Kdur this morning. F/u BMP in the am.   3. Hypertension:  Controlled on current regimen.   For questions or updates, please contact [place] Please consult [email or web address] for contact info under Cardiology/STEMI.      Signed, Lyda Jester, PA-C  03/25/2018, 9:19 AM    The patient was seen, examined and discussed with Brittainy M. Rosita Fire, PA-C and I agree with the above.   The patient remains fluid overloaded, baseline weight 193 lbs, today 203 lbs, negative 1 L in the last 24 hours, Crea is stable, I would increase lasix to 80 mg IV Q8H. H eis in permanent a-fib with good ventricular response in 80'. On xarelto.  Ena Dawley, MD 03/25/2018

## 2018-03-26 LAB — BASIC METABOLIC PANEL
Anion gap: 10 (ref 5–15)
BUN: 22 mg/dL — ABNORMAL HIGH (ref 6–20)
CO2: 31 mmol/L (ref 22–32)
Calcium: 8.6 mg/dL — ABNORMAL LOW (ref 8.9–10.3)
Chloride: 95 mmol/L — ABNORMAL LOW (ref 101–111)
Creatinine, Ser: 1.17 mg/dL (ref 0.61–1.24)
GFR calc Af Amer: >60 mL/min (ref >60)
GFR, EST NON AFRICAN AMERICAN: 59 mL/min — AB (ref >60)
GLUCOSE: 136 mg/dL — AB (ref 65–99)
POTASSIUM: 3.4 mmol/L — AB (ref 3.5–5.1)
Sodium: 136 mmol/L (ref 135–145)

## 2018-03-26 MED ORDER — FUROSEMIDE 10 MG/ML IJ SOLN
80.0000 mg | Freq: Two times a day (BID) | INTRAMUSCULAR | Status: DC
  Administered 2018-03-26 – 2018-03-28 (×4): 80 mg via INTRAVENOUS
  Filled 2018-03-26 (×4): qty 8

## 2018-03-26 MED ORDER — POTASSIUM CHLORIDE CRYS ER 20 MEQ PO TBCR
40.0000 meq | EXTENDED_RELEASE_TABLET | Freq: Once | ORAL | Status: AC
  Administered 2018-03-26: 40 meq via ORAL
  Filled 2018-03-26: qty 2

## 2018-03-26 MED ORDER — METOLAZONE 5 MG PO TABS
2.5000 mg | ORAL_TABLET | Freq: Every day | ORAL | Status: DC
Start: 2018-03-26 — End: 2018-03-28
  Administered 2018-03-26 – 2018-03-27 (×2): 2.5 mg via ORAL
  Filled 2018-03-26 (×2): qty 1

## 2018-03-26 MED ORDER — SPIRONOLACTONE 12.5 MG HALF TABLET
12.5000 mg | ORAL_TABLET | Freq: Every day | ORAL | Status: DC
  Administered 2018-03-26 – 2018-03-28 (×3): 12.5 mg via ORAL
  Filled 2018-03-26 (×4): qty 1

## 2018-03-26 NOTE — Progress Notes (Addendum)
Progress Note  Patient Name: Larry [name] Date of Encounter: 03/26/2018  Primary Cardiologist: Jenkins Rouge, MD   Subjective   Feeling ok. No CP, dyspnea or palpitations. Slight improvement in edema. Not the urinary response expected after increasing lasix dosing to TID. He got a dose at 6 am and has only urinated x2 but small amount.   Inpatient Medications    Scheduled Meds: . aspirin  81 mg Oral QHS  . furosemide  80 mg Intravenous Q8H  . metoprolol tartrate  75 mg Oral BID  . pravastatin  40 mg Oral QPM  . rivaroxaban  20 mg Oral Daily   Continuous Infusions:  PRN Meds: acetaminophen **OR** acetaminophen, nitroGLYCERIN, ondansetron **OR** ondansetron (ZOFRAN) IV   Vital Signs    Vitals:   03/25/18 1433 03/25/18 2055 03/25/18 2102 03/26/18 0423  BP: 104/82 97/85  95/82  Pulse: 98 95 96 78  Resp:      Temp: 98.2 F (36.8 C)  97.6 F (36.4 C) 98.3 F (36.8 C)  TempSrc: Oral  Axillary   SpO2: 97%  96% 95%  Weight:    201 lb 8 oz (91.4 kg)  Height:        Intake/Output Summary (Last 24 hours) at 03/26/2018 0832 Last data filed at 03/26/2018 0700 Gross per 24 hour  Intake 1080 ml  Output 1900 ml  Net -820 ml   Filed Weights   03/24/18 0614 03/25/18 0548 03/26/18 0423  Weight: 205 lb 1.6 oz (93 kg) 203 lb 11.2 oz (92.4 kg) 201 lb 8 oz (91.4 kg)    Telemetry    Atrial fib/ flutter w/ CVR in the 70s-90s - Personally Reviewed  ECG    N/A not performed today.  - Personally Reviewed  Physical Exam   GEN: No acute distress.   Neck: No JVD Cardiac: irreguarlly irregular rhythm, regular rate, no murmurs, rubs, or gallops.  Respiratory: Clear to auscultation bilaterally. GI: Soft, nontender, non-distended  MS: 2+ bilateral pitting edema, L>R Neuro:  Nonfocal  Psych: Normal affect   Labs    Chemistry Recent Labs  Lab 03/22/18 1835  03/24/18 0438 03/25/18 0405 03/26/18 0416  NA 134*   < > 136 135 136  K 5.2*   < > 3.4* 3.3* 3.4*  CL 95*   < >  96* 94* 95*  CO2 27   < > 30 30 31   GLUCOSE 102*   < > 118* 122* 136*  BUN 32*   < > 21* 19 22*  CREATININE 1.53*   < > 1.14 1.08 1.17  CALCIUM 9.0   < > 8.6* 8.8* 8.6*  PROT 6.3*  --   --   --   --   ALBUMIN 3.0*  --   --   --   --   AST 57*  --   --   --   --   ALT 39  --   --   --   --   ALKPHOS 137*  --   --   --   --   BILITOT 3.0*  --   --   --   --   GFRNONAA 43*   < > >60 >60 59*  GFRAA 50*   < > >60 >60 >60  ANIONGAP 12   < > 10 11 10    < > = values in this interval not displayed.     Hematology Recent Labs  Lab 03/22/18 1835 03/23/18 0451  WBC 10.7* 11.3*  RBC 5.70 5.83*  HGB 15.8 15.8  HCT 50.7 51.3  MCV 88.9 88.0  MCH 27.7 27.1  MCHC 31.2 30.8  RDW 16.7* 16.8*  PLT 275 257    Cardiac EnzymesNo results for input(s): TROPONINI in the last 168 hours.  Recent Labs  Lab 03/22/18 1854  TROPIPOC 0.01     BNP Recent Labs  Lab 03/22/18 1836  BNP 468.2*     DDimer No results for input(s): DDIMER in the last 168 hours.   Radiology    No results found.  Cardiac Studies   02/22/18 Study Conclusions  - Left ventricle: The cavity size was normal. Wall thickness was normal. Systolic function was at the lower limits of normal. The estimated ejection fraction was in the range of 50% to 55%. Severe hypokinesis of the basalinferolateral, inferior, and inferoseptal myocardium; consistent with infarction in the distribution of the right coronary artery. - Mitral valve: Calcified annulus. - Left atrium: The atrium was severely dilated. - Right atrium: The atrium was severely dilated. - Pulmonary arteries: Systolic pressure was mildly increased. PA peak pressure: 33 mm Hg (S). - Pericardium, extracardiac: A small pericardial effusion was identified circumferential to the heart. There was no evidence of hemodynamic compromise.   Patient Profile     77 y/o male with a history of chronic diastolic CHF, paroxysmal atrial fibrillation, CAD  status post remote PCI of the mid to distal RCA in  Feb 2000 followed by acute ST elevation MI inferiorly in Nov 2000 with occlusion of the prior RCA stent as well as 70% diagonal branch. He underwent PCI of the RCA. He also has a history of hyperlipidemia and hypertension.Cardiology consulted this admit for afib w/ RVR and CHF, at the request of Dr. Cruzita Lederer, Internal Medicine.   Assessment & Plan    1. Acute on Chronic Diastolic HF: acute exacerbation likely secondary to dietary indiscretion with sodium, as pt notes that he eats out a lot. Recent 2D echo 02/2018 showed normal LVEF at 50-55%. Treating with IV Lasix. Dosing changed yesterday to TID, 80 mg IV. Only 1.9 L of UOP yesterday. Net I/os negative 3.5 L since admit. Not the urinary response expected. Will discuss with Dr. Meda Coffee addition of metolazone to regimen for better diuresis. Renal function stable. Kdur ordered for hypokalemia, 3.4 today. I encouraged elevation of legs. Will add compression stockings. Low sodium diet.   2. Persistent Atrial Fibrillation: rate controlled in the 70s-90s. Per notes, pt has declined cardioversion in the past. He reports low bp's on higher doses of lopressor. We will continue current dosing, as rates are reasonable given limitations. Continue Xarelto for anticoagulation. Correct hypokalemia with potassium supplementation.   3. CAD: h/o PCI and stenting of the RCA in 2000. Denies CP. Continue medical therapy.   4. Hypokalemia: 3.4 today. 40 mEq of Kdur ordered. F/u BMP in the am.   3. Hypertension:  Controlled on current regimen. A bit soft. Will monitor while diuresing.   For questions or updates, please contact [place] Please consult [email or web address] for contact info under Cardiology/STEMI.     Signed, Lyda Jester, PA-C  03/26/2018, 8:32 AM    The patient was seen, examined and discussed with Brittainy M. Rosita Fire, PA-C and I agree with the above.   The patient remains fluid  overloaded, baseline weight 193 lbs, today 201 lbs, negative only 1 L in the last 24 hours, despite increased frequency of Lasix,  I would decrease lasix back to 80 mg IV Q12H, add spironolactone  12.5 mg po daily, add 1 dose of metolazone 2.5 mg po daily, replace KCl with 40 mEq daily. Crea is stable. He is in permanent a-fib with good ventricular response in 80'. On xarelto.  Ena Dawley, MD 03/26/2018

## 2018-03-26 NOTE — Progress Notes (Signed)
PROGRESS NOTE  Larry [name] ERX:[id number] DOB: [birth year] DOA: 03/22/2018 PCP: Celene Squibb, MD   LOS: 3 days   Brief Narrative / Interim history: 77 year old male with history of paroxysmal A. fib on chronic anticoagulation, coronary artery disease, hyperlipidemia, diastolic CHF who came to the hospital on 6/15 with progressive swelling in his legs and increasing his abdominal girth over the last 3 to 4 weeks.  He was found to be in heart failure and have signs of fluid overload, he was placed on IV diuresis and was admitted to the hospital.  Cardiology was consulted.  Patient was started on IV Lasix, however his urinary output was somewhat lower than expected.  His Lasix dose was increased yesterday on 6/17, and metolazone is adding today  Assessment & Plan: Principal Problem:   Anasarca Active Problems:   Hypertension   Coronary artery disease   CHF (congestive heart failure) (HCC)   Acute CHF (congestive heart failure) (HCC)   Acute diastolic CHF (congestive heart failure) (HCC)   Atrial fibrillation with RVR ([place])   Anasarca likely from acute on chronic diastolic CHF -Patient compliant with his medication regimen, however on further discussion with him and his wife he does not seem to be strict with a low-salt diet and he is eating out times a week. -Continue IV Lasix, his dose was increased from 60 twice daily to 80 3 times daily on 6/17, he only output 1.9 L, add metolazone today -He is net negative overall 3.5 L and he seems to have lost 5 pounds total so far. -His weight on admission was 206, today 201.  Patient states that his baseline was around 193-195  Paroxysmal A. fib with RVR -Patient in A fib on admission, missed metoprolol in the ED due to hypotension with diuresis, and developed A. fib with RVR the night when he was first admitted.  He was resumed on his metoprolol and his heart rate has been much better controlled.  -Telemetry reviewed, he is rate  controlled  Coronary artery disease -No chest pain, able to get up and ambulate in the room without discomfort  Acute kidney injury -Continue to closely monitor renal function due to Lasix, creatinine overall stable 1.17 this morning. -On admission his creatinine was 1.5  Hypokalemia -Potassium 3.4 this morning, continue to replete, repeat BMP tomorrow morning  Hyperlipidemia -Continue statin   DVT prophylaxis: On Xarelto Code Status: Full code Family Communication: wife present at bedside Disposition Plan: home when ready   Consultants:   Cardiology   Procedures:   None   Antimicrobials:  None    Subjective: -States a shortness of breath is improved, does not feel back to baseline.  His abdominal swelling has improved and his legs seem to be less swollen  Objective: Vitals:   03/25/18 2102 03/26/18 0423 03/26/18 0858 03/26/18 0900  BP:  95/82  99/78  Pulse: 96 78 89   Resp:      Temp: 97.6 F (36.4 C) 98.3 F (36.8 C)    TempSrc: Axillary     SpO2: 96% 95%    Weight:  91.4 kg (201 lb 8 oz)    Height:        Intake/Output Summary (Last 24 hours) at 03/26/2018 1242 Last data filed at 03/26/2018 0700 Gross per 24 hour  Intake 840 ml  Output 1100 ml  Net -260 ml   Filed Weights   03/24/18 0614 03/25/18 0548 03/26/18 0423  Weight: 93 kg (205 lb 1.6 oz) 92.4 kg (  203 lb 11.2 oz) 91.4 kg (201 lb 8 oz)    Examination:  Constitutional: No distress Eyes: No scleral icterus ENMT: Moist mixed membranes Respiratory: Clear to auscultation, no wheezing heart.  Has faint bibasilar crackles.  Good air movement Cardiovascular: Irregularly irregular, 1+ pitting lower extremity edema. + JVD Abdomen: Soft, nontender, nondistended, positive bowel sounds Skin: No rashes seen Neurologic: Nonfocal, ambulatory Psychiatric: Normal mood and affect   Data Reviewed: I have independently reviewed following labs and imaging studies   CBC: Recent Labs  Lab 03/22/18 1835  03/23/18 0451  WBC 10.7* 11.3*  HGB 15.8 15.8  HCT 50.7 51.3  MCV 88.9 88.0  PLT 275 160   Basic Metabolic Panel: Recent Labs  Lab 03/22/18 1835 03/23/18 0451 03/24/18 0438 03/25/18 0405 03/26/18 0416  NA 134* 133* 136 135 136  K 5.2* 3.8 3.4* 3.3* 3.4*  CL 95* 94* 96* 94* 95*  CO2 27 27 30 30 31   GLUCOSE 102* 187* 118* 122* 136*  BUN 32* 29* 21* 19 22*  CREATININE 1.53* 1.34* 1.14 1.08 1.17  CALCIUM 9.0 8.8* 8.6* 8.8* 8.6*  MG  --  2.0  --   --   --    GFR: Estimated Creatinine Clearance: 59.9 mL/min (by C-G formula based on SCr of 1.17 mg/dL). Liver Function Tests: Recent Labs  Lab 03/22/18 1835  AST 57*  ALT 39  ALKPHOS 137*  BILITOT 3.0*  PROT 6.3*  ALBUMIN 3.0*   No results for input(s): LIPASE, AMYLASE in the last 168 hours. No results for input(s): AMMONIA in the last 168 hours. Coagulation Profile: No results for input(s): INR, PROTIME in the last 168 hours. Cardiac Enzymes: No results for input(s): CKTOTAL, CKMB, CKMBINDEX, TROPONINI in the last 168 hours. BNP (last 3 results) Recent Labs    02/19/18 1458  PROBNP 1,527*   HbA1C: No results for input(s): HGBA1C in the last 72 hours. CBG: No results for input(s): GLUCAP in the last 168 hours. Lipid Profile: No results for input(s): CHOL, HDL, LDLCALC, TRIG, CHOLHDL, LDLDIRECT in the last 72 hours. Thyroid Function Tests: No results for input(s): TSH, T4TOTAL, FREET4, T3FREE, THYROIDAB in the last 72 hours. Anemia Panel: No results for input(s): VITAMINB12, FOLATE, FERRITIN, TIBC, IRON, RETICCTPCT in the last 72 hours. Urine analysis:    Component Value Date/Time   COLORURINE YELLOW 03/23/2018 [place] 03/23/2018 0727   LABSPEC 1.009 03/23/2018 0727   PHURINE 5.0 03/23/2018 0727   GLUCOSEU NEGATIVE 03/23/2018 0727   HGBUR NEGATIVE 03/23/2018 0727   BILIRUBINUR NEGATIVE 03/23/2018 0727   KETONESUR NEGATIVE 03/23/2018 0727   PROTEINUR NEGATIVE 03/23/2018 0727   NITRITE  NEGATIVE 03/23/2018 0727   LEUKOCYTESUR NEGATIVE 03/23/2018 0727   Sepsis Labs: Invalid input(s): PROCALCITONIN, LACTICIDVEN  No results found for this or any previous visit (from the past 240 hour(s)).    Radiology Studies: No results found.   Scheduled Meds: . aspirin  81 mg Oral QHS  . furosemide  80 mg Intravenous Q12H  . metolazone  2.5 mg Oral Daily  . metoprolol tartrate  75 mg Oral BID  . pravastatin  40 mg Oral QPM  . rivaroxaban  20 mg Oral Daily  . spironolactone  12.5 mg Oral Daily   Continuous Infusions:   Marzetta Board, MD, PhD [facility] Pager [phone]  If 7PM-7AM, please contact night-coverage [email or web address] Password [facility] 03/26/2018, 12:42 PM

## 2018-03-27 ENCOUNTER — Other Ambulatory Visit (HOSPITAL_COMMUNITY): Payer: Medicare HMO

## 2018-03-27 LAB — BASIC METABOLIC PANEL
Anion gap: 13 (ref 5–15)
BUN: 20 mg/dL (ref 6–20)
CO2: 30 mmol/L (ref 22–32)
Calcium: 9 mg/dL (ref 8.9–10.3)
Chloride: 92 mmol/L — ABNORMAL LOW (ref 101–111)
Creatinine, Ser: 1.22 mg/dL (ref 0.61–1.24)
GFR calc Af Amer: >60 mL/min (ref >60)
GFR calc non Af Amer: 56 mL/min — ABNORMAL LOW (ref >60)
Glucose, Bld: 108 mg/dL — ABNORMAL HIGH (ref 65–99)
POTASSIUM: 3.1 mmol/L — AB (ref 3.5–5.1)
Sodium: 135 mmol/L (ref 135–145)

## 2018-03-27 MED ORDER — POTASSIUM CHLORIDE CRYS ER 20 MEQ PO TBCR
40.0000 meq | EXTENDED_RELEASE_TABLET | ORAL | Status: AC
  Administered 2018-03-27 (×2): 40 meq via ORAL
  Filled 2018-03-27 (×2): qty 2

## 2018-03-27 NOTE — Progress Notes (Addendum)
Progress Note  Patient Name: Larry [name] Date of Encounter: 03/27/2018  Primary Cardiologist: Jenkins Rouge, MD   Subjective   Pt feels that change in diuretic regimen helped. He lost an additional 3 lb yesterday. No dyspnea. Main concern today is worsening ecchymossis of the volar aspect of the right forearm.  Inpatient Medications    Scheduled Meds: . aspirin  81 mg Oral QHS  . furosemide  80 mg Intravenous Q12H  . metolazone  2.5 mg Oral Daily  . metoprolol tartrate  75 mg Oral BID  . potassium chloride  40 mEq Oral Q4H  . pravastatin  40 mg Oral QPM  . rivaroxaban  20 mg Oral Daily  . spironolactone  12.5 mg Oral Daily   Continuous Infusions:  PRN Meds: acetaminophen **OR** acetaminophen, nitroGLYCERIN, ondansetron **OR** ondansetron (ZOFRAN) IV   Vital Signs    Vitals:   03/26/18 0900 03/26/18 1411 03/26/18 2139 03/27/18 0403  BP: 99/78 102/82 104/86 (!) 92/56  Pulse:  91 91 83  Resp:   16 (!) 22  Temp:  97.7 F (36.5 C) 98.5 F (36.9 C) (!) 97.5 F (36.4 C)  TempSrc:  Oral Oral Oral  SpO2:  96% 96% 98%  Weight:    198 lb 6.4 oz (90 kg)  Height:        Intake/Output Summary (Last 24 hours) at 03/27/2018 0831 Last data filed at 03/27/2018 0746 Gross per 24 hour  Intake -  Output 2350 ml  Net -2350 ml   Filed Weights   03/25/18 0548 03/26/18 0423 03/27/18 0403  Weight: 203 lb 11.2 oz (92.4 kg) 201 lb 8 oz (91.4 kg) 198 lb 6.4 oz (90 kg)    Telemetry    Persistent atrial fibrillation in the 80s-90s - Personally Reviewed  ECG    N/A not performed today - Personally Reviewed  Physical Exam   GEN: No acute distress.   Neck: No JVD Cardiac: irreguarly irregular rhythm, regular rate, no murmurs, rubs, or gallops.  Respiratory: Clear to auscultation bilaterally. GI: Soft, nontender, non-distended  MS: 1+ ankle edema bilaterally; No deformity. Volar aspect of right forearm w/ ecchymosis.  Neuro:  Nonfocal  Psych: Normal affect   Labs      Chemistry Recent Labs  Lab 03/22/18 1835  03/25/18 0405 03/26/18 0416 03/27/18 0410  NA 134*   < > 135 136 135  K 5.2*   < > 3.3* 3.4* 3.1*  CL 95*   < > 94* 95* 92*  CO2 27   < > 30 31 30   GLUCOSE 102*   < > 122* 136* 108*  BUN 32*   < > 19 22* 20  CREATININE 1.53*   < > 1.08 1.17 1.22  CALCIUM 9.0   < > 8.8* 8.6* 9.0  PROT 6.3*  --   --   --   --   ALBUMIN 3.0*  --   --   --   --   AST 57*  --   --   --   --   ALT 39  --   --   --   --   ALKPHOS 137*  --   --   --   --   BILITOT 3.0*  --   --   --   --   GFRNONAA 43*   < > >60 59* 56*  GFRAA 50*   < > >60 >60 >60  ANIONGAP 12   < > 11 10 13    < > =  values in this interval not displayed.     Hematology Recent Labs  Lab 03/22/18 1835 03/23/18 0451  WBC 10.7* 11.3*  RBC 5.70 5.83*  HGB 15.8 15.8  HCT 50.7 51.3  MCV 88.9 88.0  MCH 27.7 27.1  MCHC 31.2 30.8  RDW 16.7* 16.8*  PLT 275 257    Cardiac EnzymesNo results for input(s): TROPONINI in the last 168 hours.  Recent Labs  Lab 03/22/18 1854  TROPIPOC 0.01     BNP Recent Labs  Lab 03/22/18 1836  BNP 468.2*     DDimer No results for input(s): DDIMER in the last 168 hours.   Radiology    No results found.  Cardiac Studies   2D Echo 02/22/18  02/22/18 Study Conclusions  - Left ventricle: The cavity size was normal. Wall thickness was normal. Systolic function was at the lower limits of normal. The estimated ejection fraction was in the range of 50% to 55%. Severe hypokinesis of the basalinferolateral, inferior, and inferoseptal myocardium; consistent with infarction in the distribution of the right coronary artery. - Mitral valve: Calcified annulus. - Left atrium: The atrium was severely dilated. - Right atrium: The atrium was severely dilated. - Pulmonary arteries: Systolic pressure was mildly increased. PA peak pressure: 33 mm Hg (S). - Pericardium, extracardiac: A small pericardial effusion was identified circumferential  to the heart. There was no evidence of hemodynamic compromise.   Patient Profile     77 y/o male witha history of chronic diastolic CHF, paroxysmal atrial fibrillation, CAD status post remote PCI of the mid to distal RCA in Feb 2082followed by acute ST elevation MI inferiorly in Nov2000 with occlusion of the prior RCA stent as well as 70% diagonal branch. He underwent PCI of the RCA. He also has a history of hyperlipidemia and hypertension.Cardiology consulted this admit for afib w/ RVR and CHF, at the request of Dr. Cruzita Lederer, Internal Medicine.  Assessment & Plan    1. Acute on Chronic Diastolic CHF: acute exacerbation likely secondary to dietary indiscretion with sodium, as pt notes that he eats out a lot. Recent 2D echo 02/2018 showed normal LVEF at 50-55%. Treating with IV Lasix. Diuretic regimen adjusted 6/18 to include metolazone, 2.5 mg once daily, 30 min prior to AM Lasix dose + spironolactone 12.5 mg daily. Pt notes subjective improvement. Notes 3 lb weight loss in the past 24 hrs. UOP 1.9 L yesterday. Net I/Os - 5.88 L since admit. LEE improved compared to day prior but still with 1+ bilateral ankle edema present. Slight bump in SCr but still stable at 1.2. Will continue with metolazone + Lasix and spironolactone today. Continue with compression stockings. Low sodium diet. Hopefully we will be able to transition back to PO Lasix in the next 24-48 hrs.   2. Persistent Atrial Fibrillation: Per notes, pt has declined cardioversion in the past. Will plan to continue with rate control management, as rates appear adequately controlled with metoprolol, in the 80s-90s. He is on Xarelto for a/c.   3. CAD: h/o PCI and stenting of the RCA in 2000. Denies CP. Continue medical therapy.   4. Hypokalemia: 3.1 today. Give supplemental K. F/u BMP in the am.   5. Hypertension: currently soft, in the setting of IV diuretic therapy. Will continue to monitor closely.   6. Ecchymosis: significant  increase in ecchymosis involving the volar aspect of the right forearm, at IV sight. Increased in size since yesterday and now covering the entire length of forearm. Nearly doubled in size.  Pt worried about bleeding and being on Xarelto. I have asked RN to hold AM dose of Xarelto until I can further discuss with Dr. Meda Coffee. Can give at noon time if ok with MD.   For questions or updates, please contact [place] Please consult [email or web address] for contact info under Cardiology/STEMI.     Signed, Lyda Jester, PA-C  03/27/2018, 8:31 AM    The patient was seen, examined and discussed with Brittainy M. Rosita Fire, PA-C and I agree with the above.   The patient remains mildly fluid overloaded, baseline weight 193 lbs, today 198 lbs, negative 2.8 L in the last 24 hours, Crea is stable, I would continue lasix to 80 mg IV Q12H, spironolactone and metolazone for 1 more day, then transition to PO, anticipated discharge possible tomorrow on lasix 60 mg PO BID and spironolactone 25 mg po daily. He is in permanent a-fib with good ventricular response in 80'. On xarelto, we will hold today sec to ecchymosis on his right forearm, re-evaluate in the am.  Ena Dawley, MD 03/27/2018

## 2018-03-27 NOTE — Progress Notes (Signed)
Pt using home CPAP equipment.  No issues noted at time of eval.  Pt encouraged to call if he needs any assistance.

## 2018-03-27 NOTE — Progress Notes (Addendum)
PROGRESS NOTE    Larry [name]  TSV:[id number] DOB: Jun 04, 1941 DOA: 03/22/2018 PCP: Celene Squibb, MD    Brief Narrative:  77 year old with past medical history relevant for paroxysmal atrial fibrillation, coronary artery disease status post stents, hyperlipidemia, chronic diastolic heart failure admitted with gross fluid overload.   Assessment & Plan:   Principal Problem:   Anasarca Active Problems:   Hypertension   Coronary artery disease   CHF (congestive heart failure) (HCC)   Acute CHF (congestive heart failure) (HCC)   Acute diastolic CHF (congestive heart failure) (HCC)   Atrial fibrillation with RVR (HCC)   #) Acute on chronic diastolic heart failure exacerbation: Currently patient is improving with IV diuresis. -Cardiology following appreciate recommendations -Continue IV furosemide 80 mg twice daily and metolazone -Continue spinal lactone 12.5 mg daily -Strict ins and outs, weigh daily, 2 g sodium restricted diet, telemetry  #) Right upper extremity hematoma: At the site of prior IV. -Hold rivaroxaban  #) Hypertension/hyperlipidemia/coronary artery disease status post stents: -Continue aspirin 81 mg -Continue metoprolol tartrate 25 mg twice daily -Continue pravastatin 40 mg daily  #) Paroxysmal atrial fibrillation: -Continue rivaroxaban 20 mg nightly -Continue metoprolol tartrate 25 mg twice daily  Fluids: Restrict Electrodes: Monitor and supplement Nutrition: Heart healthy, sodium started diet  Prophylaxis: On rivaroxaban  Disposition: Pending improved diuresis  Full code   Consultants:   Cardiology  Procedures:   None  Antimicrobials:   None   Subjective: Patient reports he has had significant improvement weight gain.  Reports his edema is improved.  He has some pedal edema.  He continues to have some weeping around his abdomen.  He denies any nausea, vomiting, diarrhea, chest pain.  He is quite concerned about hematoma at her prior  incision site.  Objective: Vitals:   03/26/18 0900 03/26/18 1411 03/26/18 2139 03/27/18 0403  BP: 99/78 102/82 104/86 (!) 92/56  Pulse:  91 91 83  Resp:   16 (!) 22  Temp:  97.7 F (36.5 C) 98.5 F (36.9 C) (!) 97.5 F (36.4 C)  TempSrc:  Oral Oral Oral  SpO2:  96% 96% 98%  Weight:    90 kg (198 lb 6.4 oz)  Height:        Intake/Output Summary (Last 24 hours) at 03/27/2018 1331 Last data filed at 03/27/2018 1026 Gross per 24 hour  Intake 240 ml  Output 2750 ml  Net -2510 ml   Filed Weights   03/25/18 0548 03/26/18 0423 03/27/18 0403  Weight: 92.4 kg (203 lb 11.2 oz) 91.4 kg (201 lb 8 oz) 90 kg (198 lb 6.4 oz)    Examination:  General exam: Appears calm and comfortable  Respiratory system: Clear to auscultation. Respiratory effort normal. Cardiovascular system: Regular rate and rhythm, no murmurs Gastrointestinal system: Soft, nondistended, no rebound or guarding, plus bowel sounds, abdominal wall edema noted. Central nervous system: Alert and oriented. No focal neurological deficits. Extremities: 2+ pedal edema, right upper extremity with large ecchymosis from Skin: No rashes over visible skin Psychiatry: Judgement and insight appear normal. Mood & affect appropriate.     Data Reviewed: I have personally reviewed following labs and imaging studies  CBC: Recent Labs  Lab 03/22/18 1835 03/23/18 0451  WBC 10.7* 11.3*  HGB 15.8 15.8  HCT 50.7 51.3  MCV 88.9 88.0  PLT 275 923   Basic Metabolic Panel: Recent Labs  Lab 03/23/18 0451 03/24/18 0438 03/25/18 0405 03/26/18 0416 03/27/18 0410  NA 133* 136 135 136 135  K 3.8  3.4* 3.3* 3.4* 3.1*  CL 94* 96* 94* 95* 92*  CO2 27 30 30 31 30   GLUCOSE 187* 118* 122* 136* 108*  BUN 29* 21* 19 22* 20  CREATININE 1.34* 1.14 1.08 1.17 1.22  CALCIUM 8.8* 8.6* 8.8* 8.6* 9.0  MG 2.0  --   --   --   --    GFR: Estimated Creatinine Clearance: 57.4 mL/min (by C-G formula based on SCr of 1.22 mg/dL). Liver Function  Tests: Recent Labs  Lab 03/22/18 1835  AST 57*  ALT 39  ALKPHOS 137*  BILITOT 3.0*  PROT 6.3*  ALBUMIN 3.0*   No results for input(s): LIPASE, AMYLASE in the last 168 hours. No results for input(s): AMMONIA in the last 168 hours. Coagulation Profile: No results for input(s): INR, PROTIME in the last 168 hours. Cardiac Enzymes: No results for input(s): CKTOTAL, CKMB, CKMBINDEX, TROPONINI in the last 168 hours. BNP (last 3 results) Recent Labs    02/19/18 1458  PROBNP 1,527*   HbA1C: No results for input(s): HGBA1C in the last 72 hours. CBG: No results for input(s): GLUCAP in the last 168 hours. Lipid Profile: No results for input(s): CHOL, HDL, LDLCALC, TRIG, CHOLHDL, LDLDIRECT in the last 72 hours. Thyroid Function Tests: No results for input(s): TSH, T4TOTAL, FREET4, T3FREE, THYROIDAB in the last 72 hours. Anemia Panel: No results for input(s): VITAMINB12, FOLATE, FERRITIN, TIBC, IRON, RETICCTPCT in the last 72 hours. Sepsis Labs: No results for input(s): PROCALCITON, LATICACIDVEN in the last 168 hours.  No results found for this or any previous visit (from the past 240 hour(s)).       Radiology Studies: No results found.      Scheduled Meds: . aspirin  81 mg Oral QHS  . furosemide  80 mg Intravenous Q12H  . metolazone  2.5 mg Oral Daily  . metoprolol tartrate  75 mg Oral BID  . potassium chloride  40 mEq Oral Q4H  . pravastatin  40 mg Oral QPM  . spironolactone  12.5 mg Oral Daily   Continuous Infusions:   LOS: 4 days    Time spent: [place], MD [facility]   If 7PM-7AM, please contact night-coverage [email or web address] Password [email or web address] 03/27/2018, 1:31 PM

## 2018-03-28 DIAGNOSIS — I25118 Atherosclerotic heart disease of native coronary artery with other forms of angina pectoris: Secondary | ICD-10-CM

## 2018-03-28 DIAGNOSIS — I5033 Acute on chronic diastolic (congestive) heart failure: Secondary | ICD-10-CM

## 2018-03-28 LAB — BASIC METABOLIC PANEL
BUN: 22 mg/dL — ABNORMAL HIGH (ref 6–20)
CO2: 34 mmol/L — ABNORMAL HIGH (ref 22–32)
Chloride: 85 mmol/L — ABNORMAL LOW (ref 101–111)
Creatinine, Ser: 1.22 mg/dL (ref 0.61–1.24)
Sodium: 134 mmol/L — ABNORMAL LOW (ref 135–145)

## 2018-03-28 LAB — BASIC METABOLIC PANEL WITH GFR
Anion gap: 15 (ref 5–15)
Calcium: 9.4 mg/dL (ref 8.9–10.3)
GFR calc Af Amer: >60 mL/min (ref >60)
GFR calc non Af Amer: 56 mL/min — ABNORMAL LOW (ref >60)
Glucose, Bld: 125 mg/dL — ABNORMAL HIGH (ref 65–99)
Potassium: 3.1 mmol/L — ABNORMAL LOW (ref 3.5–5.1)

## 2018-03-28 LAB — CBC
HCT: 47.6 % (ref 39.0–52.0)
Hemoglobin: 14.9 g/dL (ref 13.0–17.0)
MCH: 26.8 pg (ref 26.0–34.0)
MCHC: 31.3 g/dL (ref 30.0–36.0)
MCV: 85.8 fL (ref 78.0–100.0)
Platelets: 216 K/uL (ref 150–400)
RBC: 5.55 MIL/uL (ref 4.22–5.81)
RDW: 16.8 % — ABNORMAL HIGH (ref 11.5–15.5)
WBC: 10.8 K/uL — ABNORMAL HIGH (ref 4.0–10.5)

## 2018-03-28 LAB — MAGNESIUM: Magnesium: 1.8 mg/dL (ref 1.7–2.4)

## 2018-03-28 MED ORDER — FUROSEMIDE 40 MG PO TABS: 60.0000 mg | ORAL_TABLET | Freq: Two times a day (BID) | ORAL | 3 refills | Status: DC

## 2018-03-28 MED ORDER — POTASSIUM CHLORIDE ER 10 MEQ PO TBCR
20.0000 meq | EXTENDED_RELEASE_TABLET | Freq: Every day | ORAL | Status: DC
  Filled 2018-03-28: qty 2

## 2018-03-28 MED ORDER — SPIRONOLACTONE 25 MG PO TABS: 25.0000 mg | ORAL_TABLET | Freq: Every day | ORAL | 3 refills | Status: DC

## 2018-03-28 MED ORDER — POTASSIUM CHLORIDE CRYS ER 20 MEQ PO TBCR: 40.0000 meq | EXTENDED_RELEASE_TABLET | Freq: Once | ORAL | Status: DC

## 2018-03-28 MED ORDER — RIVAROXABAN 20 MG PO TABS
20.0000 mg | ORAL_TABLET | Freq: Every day | ORAL | Status: DC
  Filled 2018-03-28: qty 1

## 2018-03-28 MED ORDER — FUROSEMIDE 40 MG PO TABS: 60.0000 mg | ORAL_TABLET | Freq: Two times a day (BID) | ORAL | Status: DC

## 2018-03-28 MED ORDER — DIGOXIN 125 MCG PO TABS: 0.0625 mg | ORAL_TABLET | Freq: Every day | ORAL | Status: DC

## 2018-03-28 MED ORDER — POTASSIUM CHLORIDE CRYS ER 20 MEQ PO TBCR
40.0000 meq | EXTENDED_RELEASE_TABLET | Freq: Once | ORAL | Status: AC
  Administered 2018-03-28: 40 meq via ORAL
  Filled 2018-03-28: qty 2

## 2018-03-28 NOTE — Discharge Summary (Signed)
Physician Discharge Summary   Patient ID: Larry [name] MRN: [id number] DOB/AGE: 1941-07-29 77 y.o.  Admit date: 03/22/2018 Discharge date: 03/28/2018  Primary Care Physician:  Celene Squibb, MD   Recommendations for Outpatient Follow-up:  1. Follow up with PCP in 1-2 weeks 2. Please obtain BMP in one week with PCP or at cardiology appointment  Home Health: None Equipment/Devices: None  Discharge Condition: stable CODE STATUS: FULL  Diet recommendation: Heart healthy diet   Discharge Diagnoses:     Acute on chronic diastolic CHF  persistent atrial fibrillation on Xarelto . Hypertension . Coronary artery disease . Right upper extremity ecchymosis/hematoma  Hypokalemia Acute kidney injury  Consults: Cardiology, Dr. Meda Coffee    Allergies:   Allergies  Allergen Reactions  . Plavix [Clopidogrel Bisulfate] Anaphylaxis          DISCHARGE MEDICATIONS: Allergies as of 03/28/2018      Reactions   Plavix [clopidogrel Bisulfate] Anaphylaxis         Medication List    STOP taking these medications   aspirin 81 MG tablet     TAKE these medications   furosemide 40 MG tablet Commonly known as:  LASIX Take 1.5 tablets (60 mg total) by mouth 2 (two) times daily. What changed:  when to take this   metoprolol tartrate 50 MG tablet Commonly known as:  LOPRESSOR Take 75 mg by mouth 2 (two) times daily.   nitroGLYCERIN 0.4 MG SL tablet Commonly known as:  NITROSTAT Place 1 tablet (0.4 mg total) under the tongue every 5 (five) minutes as needed for chest pain (3 doses max).   potassium chloride 10 MEQ tablet Commonly known as:  KLOR-CON M20 Take 2 tablets (20 mEq total) by mouth daily. What changed:    how much to take  when to take this   pravastatin 40 MG tablet Commonly known as:  PRAVACHOL Take 1 tablet (40 mg total) by mouth every evening.   rivaroxaban 20 MG Tabs tablet Commonly known as:  XARELTO Take 1 tablet (20 mg total) by mouth daily with  supper.   spironolactone 25 MG tablet Commonly known as:  ALDACTONE Take 1 tablet (25 mg total) by mouth daily. Start taking on:  03/29/2018        Brief H and P: For complete details please refer to admission H and P, but in brief 77 year old male with history of paroxysmal A. fib on chronic anticoagulation, coronary artery disease, hyperlipidemia, diastolic CHF who came to the hospital on 6/15 with progressive swelling in his legs and increasing his abdominal girth over the last 3 to 4 weeks.  He was found to be in heart failure and have signs of fluid overload, he was placed on IV diuresis and was admitted to the hospital.  Cardiology was consulted.   Hospital Course:   Acute on chronic diastolic CHF with peripheral edema, anasarca -Patient was admitted and placed on IV diuresis.  Patient has been compliant with his medication regimen however appears not to be strict with the low-salt diet and had been eating out -Patient was placed on IV Lasix with metolazone -Negative balance of 8.1 L back to dry weight 193 LBS.  Cardiology recommended to discharge on Lasix 60 mg twice a day (taking 40 mg twice a day at home), spironolactone 25 mg daily -He will follow-up with cardiology, Dr. Meda Coffee and will need BMET -Patient was recommended to be compliant with diet, sodium restriction  Persistent atrial fibrillation -During hospitalization patient had atrial fibrillation with  RVR, had missed metoprolol dose in the ED -Currently rate controlled, continue Lopressor, started Xarelto     Hypertension -Continue Lasix, Aldactone, Lopressor, BP stable but soft    Coronary artery disease -Currently no chest pain, ambulating in the room without any difficulty  Acute kidney injury Possibly due to acute diastolic CHF and decreased renal perfusion Creatinine 1.5 at the time of admission, baseline 1.1 Patient received IV Lasix for diuresis, creatinine 1.2 at the time of discharge.  Right upper arm  ecchymosis/hematoma -Xarelto was held, per cardiology right upper arm hematoma is stable hence Xarelto can be restarted.  Continue to hold aspirin  Day of Discharge S: Feeling a lot better, no chest pain or shortness of breath, concerned about the right forearm ecchymosis.  BP 107/83 (BP Location: Left Arm)   Pulse 83   Temp (!) 97.5 F (36.4 C) (Oral)   Resp 18   Ht 6' (1.829 m)   Wt 87.8 kg (193 lb 9.6 oz)   SpO2 94%   BMI 26.26 kg/m   Physical Exam: General: Alert and awake oriented x3 not in any acute distress. HEENT: JVD CVS: irregularly irregular rhythm Chest: clear to auscultation bilaterally, no wheezing rales or rhonchi Abdomen: soft nontender, nondistended, normal bowel sounds Extremities: 1+ ankle edema bilaterally, right forearm ecchymosis with small hematoma on the volar aspect Neuro: Cranial nerves II-XII intact, no focal neurological deficits   The results of significant diagnostics from this hospitalization (including imaging, microbiology, ancillary and laboratory) are listed below for reference.      Procedures/Studies:  Dg Chest 2 View  Result Date: 03/22/2018 CLINICAL DATA:  Short of breath and swelling EXAM: CHEST - 2 VIEW COMPARISON:  CT chest 12/03/2016.  Chest x-ray 12/03/2016 FINDINGS: Pulmonary vascular congestion unchanged. Progression of small bilateral effusions and bibasilar atelectasis. Negative for edema. IMPRESSION: Pulmonary vascular congestion and small pleural effusions consistent with fluid overload. Bibasilar atelectasis. Electronically Signed   By: Franchot Gallo M.D.   On: 03/22/2018 19:40   US Abdomen Complete  Result Date: 03/23/2018 CLINICAL DATA:  Elevated LFTs EXAM: ABDOMEN ULTRASOUND COMPLETE COMPARISON:  None. FINDINGS: Gallbladder: No gallstones or wall thickening visualized. No sonographic Murphy sign noted by sonographer. Common bile duct: Diameter: Normal caliber, 3 mm Liver: Increased echotexture compatible with fatty  infiltration. No focal abnormality or biliary ductal dilatation. Portal vein is patent on color Doppler imaging with normal direction of blood flow towards the liver. IVC: No abnormality visualized. Pancreas: Visualized portion unremarkable. Spleen: Size and appearance within normal limits. Right Kidney: Length: 11.2 cm. Shadowing calcification in the upper pole measures 2.6 cm. This could be large shadowing stone or calcified renal lesion. No hydronephrosis. Left Kidney: Length: 9.2 cm. Echogenicity within normal limits. No mass or hydronephrosis visualized. Abdominal aorta: No aneurysm visualized. Other findings: Bilateral pleural effusions noted. Small amount of ascites adjacent to the spleen. IMPRESSION: Fatty infiltration of the liver. Bilateral effusions and trace perisplenic ascites. 2.6 cm calcified structure in the right upper kidney. It is difficult to determine if this is a calcified renal lesion or large nonobstructing stone. This could be further characterized with elective CT with and without contrast. Electronically Signed   By: Rolm Baptise M.D.   On: 03/23/2018 08:21       LAB RESULTS: Basic Metabolic Panel: Recent Labs  Lab 03/27/18 0410 03/28/18 0554  NA 135 134*  K 3.1* 3.1*  CL 92* 85*  CO2 30 34*  GLUCOSE 108* 125*  BUN 20 22*  CREATININE  1.22 1.22  CALCIUM 9.0 9.4  MG  --  1.8   Liver Function Tests: Recent Labs  Lab 03/22/18 1835  AST 57*  ALT 39  ALKPHOS 137*  BILITOT 3.0*  PROT 6.3*  ALBUMIN 3.0*   No results for input(s): LIPASE, AMYLASE in the last 168 hours. No results for input(s): AMMONIA in the last 168 hours. CBC: Recent Labs  Lab 03/23/18 0451 03/28/18 0554  WBC 11.3* 10.8*  HGB 15.8 14.9  HCT 51.3 47.6  MCV 88.0 85.8  PLT 257 216   Cardiac Enzymes: No results for input(s): CKTOTAL, CKMB, CKMBINDEX, TROPONINI in the last 168 hours. BNP: Invalid input(s): POCBNP CBG: No results for input(s): GLUCAP in the last 168  hours.    Disposition and Follow-up: Discharge Instructions    (HEART FAILURE PATIENTS) Call MD:  Anytime you have any of the following symptoms: 1) 3 pound weight gain in 24 hours or 5 pounds in 1 week 2) shortness of breath, with or without a dry hacking cough 3) swelling in the hands, feet or stomach 4) if you have to sleep on extra pillows at night in order to breathe.   Complete by:  As directed    Diet - low sodium heart healthy   Complete by:  As directed    Increase activity slowly   Complete by:  As directed        DISPOSITION: Home   DISCHARGE FOLLOW-UP Follow-up Information    Dorothy Spark, MD Follow up on 04/09/2018.   Specialty:  Cardiology Why:  at 2:00 pm with her PA. Need labs BMET for potassium and kidney function Contact information: [address] [place] [address] [phone]        Celene Squibb, MD. Schedule an appointment as soon as possible for a visit in 2 week(s).   Specialty:  Internal Medicine Contact information: [place] [address] [phone]            Time coordinating discharge:  40 mins  Signed:   Estill Cotta M.D. [facility] 03/28/2018, 11:48 AM Pager: [phone]

## 2018-03-28 NOTE — Progress Notes (Addendum)
Progress Note  Patient Name: Larry [name] Date of Encounter: 03/28/2018  Primary Cardiologist: Jenkins Rouge, MD   Subjective   Pt feels well this am.  Inpatient Medications    Scheduled Meds: . aspirin  81 mg Oral QHS  . furosemide  60 mg Oral BID  . metoprolol tartrate  75 mg Oral BID  . potassium chloride  20 mEq Oral Daily  . potassium chloride  40 mEq Oral Once  . pravastatin  40 mg Oral QPM  . rivaroxaban  20 mg Oral Daily  . spironolactone  12.5 mg Oral Daily   Continuous Infusions:  PRN Meds: acetaminophen **OR** acetaminophen, nitroGLYCERIN, ondansetron **OR** ondansetron (ZOFRAN) IV   Vital Signs    Vitals:   03/27/18 0403 03/27/18 1500 03/27/18 2058 03/28/18 0419  BP: (!) 92/56 103/81 109/86 107/83  Pulse: 83 81 97 83  Resp: (!) 22  17 18   Temp: (!) 97.5 F (36.4 C) 97.6 F (36.4 C)  (!) 97.5 F (36.4 C)  TempSrc: Oral Oral  Oral  SpO2: 98% 93% 90% 94%  Weight: 198 lb 6.4 oz (90 kg)   193 lb 9.6 oz (87.8 kg)  Height:        Intake/Output Summary (Last 24 hours) at 03/28/2018 0946 Last data filed at 03/28/2018 0900 Gross per 24 hour  Intake 960 ml  Output 3250 ml  Net -2290 ml   Filed Weights   03/26/18 0423 03/27/18 0403 03/28/18 0419  Weight: 201 lb 8 oz (91.4 kg) 198 lb 6.4 oz (90 kg) 193 lb 9.6 oz (87.8 kg)    Telemetry    Persistent atrial fibrillation in the 80s-90s - Personally Reviewed  ECG    N/A not performed today - Personally Reviewed  Physical Exam   GEN: No acute distress.   Neck: No JVD Cardiac: irreguarly irregular rhythm, regular rate, no murmurs, rubs, or gallops.  Respiratory: Clear to auscultation bilaterally. GI: Soft, nontender, non-distended  MS: 1+ ankle edema bilaterally; No deformity. Volar aspect of right forearm w/ ecchymosis.  Neuro:  Nonfocal  Psych: Normal affect   Labs    Chemistry Recent Labs  Lab 03/22/18 1835  03/26/18 0416 03/27/18 0410 03/28/18 0554  NA 134*   < > 136 135 134*  K 5.2*    < > 3.4* 3.1* 3.1*  CL 95*   < > 95* 92* 85*  CO2 27   < > 31 30 34*  GLUCOSE 102*   < > 136* 108* 125*  BUN 32*   < > 22* 20 22*  CREATININE 1.53*   < > 1.17 1.22 1.22  CALCIUM 9.0   < > 8.6* 9.0 9.4  PROT 6.3*  --   --   --   --   ALBUMIN 3.0*  --   --   --   --   AST 57*  --   --   --   --   ALT 39  --   --   --   --   ALKPHOS 137*  --   --   --   --   BILITOT 3.0*  --   --   --   --   GFRNONAA 43*   < > 59* 56* 56*  GFRAA 50*   < > >60 >60 >60  ANIONGAP 12   < > 10 13 15    < > = values in this interval not displayed.     Hematology Recent Labs  Lab 03/22/18  1835 03/23/18 0451 03/28/18 0554  WBC 10.7* 11.3* 10.8*  RBC 5.70 5.83* 5.55  HGB 15.8 15.8 14.9  HCT 50.7 51.3 47.6  MCV 88.9 88.0 85.8  MCH 27.7 27.1 26.8  MCHC 31.2 30.8 31.3  RDW 16.7* 16.8* 16.8*  PLT 275 257 216    Cardiac EnzymesNo results for input(s): TROPONINI in the last 168 hours.  Recent Labs  Lab 03/22/18 1854  TROPIPOC 0.01     BNP Recent Labs  Lab 03/22/18 1836  BNP 468.2*     DDimer No results for input(s): DDIMER in the last 168 hours.   Radiology    No results found.  Cardiac Studies   2D Echo 02/22/18  02/22/18 Study Conclusions  - Left ventricle: The cavity size was normal. Wall thickness was normal. Systolic function was at the lower limits of normal. The estimated ejection fraction was in the range of 50% to 55%. Severe hypokinesis of the basalinferolateral, inferior, and inferoseptal myocardium; consistent with infarction in the distribution of the right coronary artery. - Mitral valve: Calcified annulus. - Left atrium: The atrium was severely dilated. - Right atrium: The atrium was severely dilated. - Pulmonary arteries: Systolic pressure was mildly increased. PA peak pressure: 33 mm Hg (S). - Pericardium, extracardiac: A small pericardial effusion was identified circumferential to the heart. There was no evidence of hemodynamic  compromise.   Patient Profile     77 y/o male witha history of chronic diastolic CHF, paroxysmal atrial fibrillation, CAD status post remote PCI of the mid to distal RCA in Feb 2012followed by acute ST elevation MI inferiorly in Nov2000 with occlusion of the prior RCA stent as well as 70% diagonal branch. He underwent PCI of the RCA. He also has a history of hyperlipidemia and hypertension.Cardiology consulted this admit for afib w/ RVR and CHF, at the request of Dr. Cruzita Lederer, Internal Medicine.  Assessment & Plan    1. Acute on Chronic Diastolic CHF: acute exacerbation likely secondary to dietary indiscretion with sodium, 2. Persistent Atrial Fibrillation: Per notes, pt has declined cardioversion in the past. Will plan to continue with rate control management, as rates appear adequately controlled with metoprolol, in the 80s-90s. He is on Xarelto for a/c.  3. CAD: h/o PCI and stenting of the RCA in 2000. Denies CP. Continue medical therapy.  4. Hypokalemia: 3.1 today. Give supplemental K. F/u BMP in the am.  5. Hypertension: currently soft, in the setting of IV diuretic therapy. Will continue to monitor closely.  6. Ecchymosis: significant increase in ecchymosis involving the volar aspect of the right forearm, at IV sight. Increased in size since yesterday and now covering the entire length of forearm. Nearly doubled in size. Pt worried about bleeding and being on Xarelto. I have asked RN to hold AM dose of Xarelto until I can further discuss with Dr. Meda Coffee. Can give at noon time if ok with MD.   The patient appears almost euvolemic. Now at baseline weight 193 lbs, negative 1.9 L in the last 24 hours, Crea is stable, I would switch to lasix 60 mg PO BID and spironolactone 25 mg po daily and discharge home. He is in permanent a-fib with good ventricular response in 80'. On xarelto, we will restart today, ecchymosis on his right forearm is stable. We will arrange for an outpatient follow up  with labs within next 2 weeks. If he continues to retain fluid we can add Metolazone once or twice a week. He also wants to follow with  me, we will arrange an appointment on first availability basis.   Ena Dawley, MD 03/28/2018

## 2018-03-28 NOTE — Consult Note (Signed)
            [facility] Primary Care Navigator  03/28/2018  Larry [name] Jul 23, 1941 [id number]   Wentto see patient in the room to identify possible discharge needs but he was justdischargehomeper staffreport.  Per chart review, patient was admittedwith progressive swelling in his legs and increasing abdominal girth. (A-fib with rapid ventricular response and acute on chronic diastolic congestive HF).  Patient has discharge instruction to follow-up withprimary care provider in 2 weeks; cardiology follow-up on 04/09/18.  Primary care provider's office called (Amber/ Caryl Pina) to notify of patient's discharge, need for post hospital follow-up and transition of care (TOC). Notified ofpatient'shealth issues needing close follow-up (mainly A-fib and congestive HF).  Made aware to refer patient to [facility] care management as deemed necessaryandappropriate for any services.   For additional questions please contact:  Edwena Felty A. Caffie Sotto, BSN, RN-BC [facility] PRIMARY CARE Navigator Cell: [phone]

## 2018-03-28 NOTE — Care Management Important Message (Signed)
Important Message  Patient Details  Name: Larry [name] MRN: [id number] Date of Birth: [birth year]   Medicare Important Message Given:  Yes    Barb Merino [place] 03/28/2018, 3:14 PM

## 2018-04-01 ENCOUNTER — Other Ambulatory Visit: Payer: Self-pay | Admitting: Internal Medicine

## 2018-04-09 ENCOUNTER — Encounter: Payer: Self-pay | Admitting: Physician Assistant

## 2018-04-09 ENCOUNTER — Ambulatory Visit: Payer: Medicare HMO | Admitting: Physician Assistant

## 2018-04-09 VITALS — BP 106/60 | HR 87 | Ht 72.0 in | Wt 196.0 lb

## 2018-04-09 DIAGNOSIS — I2510 Atherosclerotic heart disease of native coronary artery without angina pectoris: Secondary | ICD-10-CM

## 2018-04-09 DIAGNOSIS — I5033 Acute on chronic diastolic (congestive) heart failure: Secondary | ICD-10-CM

## 2018-04-09 DIAGNOSIS — J9601 Acute respiratory failure with hypoxia: Secondary | ICD-10-CM | POA: Diagnosis not present

## 2018-04-09 DIAGNOSIS — I483 Typical atrial flutter: Secondary | ICD-10-CM

## 2018-04-09 DIAGNOSIS — I481 Persistent atrial fibrillation: Secondary | ICD-10-CM

## 2018-04-09 DIAGNOSIS — I4819 Other persistent atrial fibrillation: Secondary | ICD-10-CM

## 2018-04-09 DIAGNOSIS — I10 Essential (primary) hypertension: Secondary | ICD-10-CM

## 2018-04-09 NOTE — Patient Instructions (Addendum)
Medication Instructions:  Your physician recommends that you continue on your current medications as directed. Please refer to the Current Medication list given to you today.   Labwork: TODAY:  BMET & PRO BNP  Testing/Procedures: None ordered  Follow-Up: Your physician recommends that you schedule a follow-up appointment in: [place]   Any Other Special Instructions Will Be Listed Below (If Applicable).     If you need a refill on your cardiac medications before your next appointment, please call your pharmacy.

## 2018-04-09 NOTE — Progress Notes (Signed)
Cardiology Office Note    Date:  04/09/2018   ID:  Larry [name], DOB 1941-05-18, MRN [id number]  PCP:  Patient, No Pcp Per  Cardiologist:  Dr. Johnsie Cancel   Chief Complaint: Hospital follow up   History of Present Illness:   Larry [name] is a 77 y.o. male witha history of chronic diastolic CHF, persistent atrial fibrillation, CAD, HTN, OSA on CPAP and HLD presents for follow up.   Hx of CAD status post remote PCI of the mid to distal RCA in Feb 2051followed by acute ST elevation MI inferiorly in Nov2000 with occlusion of the prior RCA stent as well as 70% diagonal branch. He underwent PCI of the RCA.  Recently admitted 03/2018 for acute diastolic CHF exacerbation likely secondary to dietary indiscretion with sodium. On rate control strategies. Had ecchymosis on his right forearm. Held xarelto of one day.   Here today for follow up. Follows low sodium diet. No energy. Discharge weight was 193lb. It reduced to 189lb  now stable at 191lb on home scale for past 5 days. Follows low sodium diet and fluid restriction. No bleeding issue. No orthopnea, PND chest pain or dyspnea.    Past Medical History:  Diagnosis Date  . Coronary artery disease    status post remote PCI of the mid to distal RCA 11/1998 followed by acute ST elevation MI inferiorly 08/1999 with occlusion of the prior RCA stent as well as 70% diagonal branch.  He underwent PCI of the RCA  . Hypercholesterolemia   . Hypertension   . Myocardial infarct ([place])   . Olecranon bursitis     Past Surgical History:  Procedure Laterality Date  . CARDIAC CATHETERIZATION  12/15/1999   EF was 55%   . CORONARY ANGIOPLASTY WITH STENT PLACEMENT  08/15/1999   CAD, status post prior stenting of the mid to distal RCA in 11/1998, with an acute diaphragmatic wall infarction due to total occlusion at the stent site/There is also 70% narrowing in the diagonal branch of the LAD/The LV showed inferior wall hypo- akinesis.-- Successful reperfusion,  percutaneous transluminal coronary percutaneous transluminal coronary & placement of a 2nd overlying stent in RCA    Current Medications: Prior to Admission medications   Medication Sig Start Date End Date Taking? Authorizing Provider  furosemide (LASIX) 40 MG tablet Take 1.5 tablets (60 mg total) by mouth 2 (two) times daily. 03/28/18   Rai, Vernelle Emerald, MD  metoprolol tartrate (LOPRESSOR) 50 MG tablet Take 75 mg by mouth 2 (two) times daily.    [provider]  nitroGLYCERIN (NITROSTAT) 0.4 MG SL tablet Place 1 tablet (0.4 mg total) under the tongue every 5 (five) minutes as needed for chest pain (3 doses max). 02/19/18   Imogene Burn, PA-C  potassium chloride (K-DUR,KLOR-CON) 10 MEQ tablet Take 2 tablets (20 mEq total) by mouth daily. Patient taking differently: Take 10 mEq by mouth 2 (two) times daily.  03/11/18   Josue Hector, MD  pravastatin (PRAVACHOL) 40 MG tablet Take 1 tablet (40 mg total) by mouth every evening. 12/24/17   Josue Hector, MD  rivaroxaban (XARELTO) 20 MG TABS tablet Take 1 tablet (20 mg total) by mouth daily with supper. 10/10/17   Josue Hector, MD  spironolactone (ALDACTONE) 25 MG tablet Take 1 tablet (25 mg total) by mouth daily. 03/29/18   Mendel Corning, MD    Allergies:   Plavix [clopidogrel bisulfate]   Social History   Socioeconomic History  . Marital status: Married  Spouse name: scarlett  . Number of children: 3  . Years of education: 39  . Highest education level: Not on file  Occupational History  . Occupation: retired  Scientific laboratory technician  . Financial resource strain: Not on file  . Food insecurity:    Worry: Not on file    Inability: Not on file  . Transportation needs:    Medical: Not on file    Non-medical: Not on file  Tobacco Use  . Smoking status: Former Smoker    Types: Cigarettes    Last attempt to quit: 11/02/1999    Years since quitting: 18.4  . Smokeless tobacco: Never Used  Substance and Sexual Activity  . Alcohol  use: No  . Drug use: No  . Sexual activity: Not on file  Lifestyle  . Physical activity:    Days per week: Not on file    Minutes per session: Not on file  . Stress: Not on file  Relationships  . Social connections:    Talks on phone: Not on file    Gets together: Not on file    Attends religious service: Not on file    Active member of club or organization: Not on file    Attends meetings of clubs or organizations: Not on file    Relationship status: Not on file  Other Topics Concern  . Not on file  Social History Narrative  . Not on file     Family History:  The patient's family history includes Cancer in his father; Heart Problems in his mother.   ROS:   Please see the history of present illness.    ROS All other systems reviewed and are negative.   PHYSICAL EXAM:   VS:  BP 106/60   Pulse 87   Ht 6' (1.829 m)   Wt 196 lb (88.9 kg)   SpO2 97%   BMI 26.58 kg/m    GEN: Well nourished, well developed, in no acute distress  HEENT: normal  Neck: + JVD, no carotid bruits, or masses Cardiac: Ir IR ; no murmurs, rubs, or gallops, significant bilateral ankle edema  Respiratory:  clear to auscultation bilaterally, normal work of breathing GI: soft, nontender, + distended, + BS MS: no deformity or atrophy  Skin: warm and dry, no rash Neuro:  Alert and Oriented x 3, Strength and sensation are intact Psych: euthymic mood, full affect  Wt Readings from Last 3 Encounters:  04/09/18 196 lb (88.9 kg)  03/28/18 193 lb 9.6 oz (87.8 kg)  02/28/18 200 lb 9.6 oz (91 kg)      Studies/Labs Reviewed:   EKG:  EKG is ordered today.  The ekg ordered today demonstrates afib at rate of 87lb  Recent Labs: 02/19/2018: NT-Pro BNP 1,527 03/22/2018: ALT 39; B Natriuretic Peptide 468.2 03/28/2018: BUN 22; Creatinine, Ser 1.22; Hemoglobin 14.9; Magnesium 1.8; Platelets 216; Potassium 3.1; Sodium 134   Lipid Panel    Component Value Date/Time   CHOL 139 04/12/2016 0923   TRIG 127  04/12/2016 0923   HDL 37 (L) 04/12/2016 0923   CHOLHDL 3.8 04/12/2016 0923   VLDL 25 04/12/2016 0923   LDLCALC 77 04/12/2016 0923   LDLDIRECT 144.3 08/15/2013 1303    Additional studies/ records that were reviewed today include:   2D Echo 02/22/18 - Left ventricle: The cavity size was normal. Wall thickness was normal. Systolic function was at the lower limits of normal. The estimated ejection fraction was in the range of 50% to 55%. Severe  hypokinesis of the basalinferolateral, inferior, and inferoseptal myocardium; consistent with infarction in the distribution of the right coronary artery. - Mitral valve: Calcified annulus. - Left atrium: The atrium was severely dilated. - Right atrium: The atrium was severely dilated. - Pulmonary arteries: Systolic pressure was mildly increased. PA peak pressure: 33 mm Hg (S). - Pericardium, extracardiac: A small pericardial effusion was identified circumferential to the heart. There was no evidence of hemodynamic compromise.   ANGIOGRAPHIC RESULTS: 12/15/99 1. LEFT MAIN CORONARY ARTERY:  Free of significant disease. 2. LEFT ANTERIOR DESCENDING CORONARY:   Gave rise to a diagonal branch, a large    septal perforator, a small diagonal branch, and a small septal perforator.    There was some irregularity in the LAD, but no significant obstruction. 3. CIRCUMFLEX CORONARY ARTERY:  Gave rise to a large marginal branch and a    posterolateral branch.  There was 30% narrowing in the marginal branch, and    40% narrowing in the proximal circumflex artery. 4. RIGHT CORONARY ARTERY:  A moderately large vessel.  It gave rise to two right    ventricular branches, a posterior descending branch and three posterolateral  branches.  The site of the two tandem overlying stents was widely patent, with    only 20% focal narrowing at the point where the stents overlapped.  There was    no significant distal disease.  LEFT VENTRICULOGRAPHY:   Performed in the RAO projection showed overall good wall motion.  There was mild hypokinesis of the inferobasilar segment.  The estimated ejection fraction was 55%.  CONCLUSIONS:  Coronary artery disease, status post prior stenting of the right coronary artery February 2000; and status post prior DMI treated with second overlying stents of the distal right coronary artery November 2000.  No evidence at that time of restenosis (20%) at the stent site, 40% narrowing in the proximal circumflex artery with 30% narrowing in the marginal branch, and mild irregularities in the LAD with mild inferobasilar wall hypokinesis.  RECOMMENDATIONS:  The patient has not developed restenosis at the stent site. e left ventricular function is good.  Will plan reassurance and continued secondary prevention.  ASSESSMENT & PLAN:     1. Acute on chronic diastolic CHF - Evidence of volume overload by exam. Not interested in higher dose of lasix however agree to check lab first. Continue BB and aldactone.   2. Persistent atrial fibrillation - Rate stable. Continue BB and Xarelto.   3. CAD - No angina. Not on ASA due to need of anticoagulation. Continue BB and statin.   4. HTN  - BP stable.  5. HLD - On statin  6. Transient acute hypoxia Initially his oxygen level was 65 on room air at check up then it gradually improved. Placed on supplement oxygen at 2L during office visit improvement. Seems this likely due to walking from parking lot. He walked here for 5 minutes at room air with oxygen saturation of 93-96. No need for home oxygen.   Medication Adjustments/Labs and Tests Ordered: Current medicines are reviewed at length with the patient today.  Concerns regarding medicines are outlined above.  Medication changes, Labs and Tests ordered today are listed in the Patient Instructions below. Patient Instructions  Medication Instructions:  Your physician recommends that you continue on your current  medications as directed. Please refer to the Current Medication list given to you today.   Labwork: TODAY:  BMET & PRO BNP  Testing/Procedures: None ordered  Follow-Up: Your physician recommends  that you schedule a follow-up appointment in: [place]   Any Other Special Instructions Will Be Listed Below (If Applicable).     If you need a refill on your cardiac medications before your next appointment, please call your pharmacy.      Jarrett Soho, [place]  04/09/2018 3:44 PM    [facility] [address], [place], [place]  [address] Phone: [phone]; Fax: [phone]

## 2018-04-10 ENCOUNTER — Telehealth: Payer: Self-pay

## 2018-04-10 DIAGNOSIS — I509 Heart failure, unspecified: Secondary | ICD-10-CM

## 2018-04-10 LAB — BASIC METABOLIC PANEL
BUN/Creatinine Ratio: 24 (ref 10–24)
BUN: 37 mg/dL — AB (ref 8–27)
CALCIUM: 9.6 mg/dL (ref 8.6–10.2)
CO2: 26 mmol/L (ref 20–29)
CREATININE: 1.55 mg/dL — AB (ref 0.76–1.27)
Chloride: 88 mmol/L — ABNORMAL LOW (ref 96–106)
GFR calc Af Amer: 50 mL/min/{1.73_m2} — ABNORMAL LOW (ref >59)
GFR, EST NON AFRICAN AMERICAN: 43 mL/min/{1.73_m2} — AB (ref >59)
GLUCOSE: 200 mg/dL — AB (ref 65–99)
Potassium: 4.9 mmol/L (ref 3.5–5.2)
Sodium: 136 mmol/L (ref 134–144)

## 2018-04-10 LAB — PRO B NATRIURETIC PEPTIDE: NT-PRO BNP: 3260 pg/mL — AB (ref 0–486)

## 2018-04-10 MED ORDER — TORSEMIDE 20 MG PO TABS: 40.0000 mg | ORAL_TABLET | Freq: Two times a day (BID) | ORAL | 11 refills | Status: DC

## 2018-04-10 NOTE — Telephone Encounter (Signed)
Called patient with lab results. Per Dr. Johnsie Cancel and [place] PA change to torsemide 40 mg BID. Patient will come in for lab work in one week for BMET. Patient verbalized understanding.

## 2018-04-10 NOTE — Telephone Encounter (Signed)
-----   Message from Josue Hector, MD sent at 04/10/2018  8:50 AM EDT ----- Can do torsemide and see if we can get him into CHF clinic

## 2018-04-17 ENCOUNTER — Other Ambulatory Visit: Payer: Medicare HMO

## 2018-04-17 DIAGNOSIS — I509 Heart failure, unspecified: Secondary | ICD-10-CM

## 2018-04-18 ENCOUNTER — Ambulatory Visit (HOSPITAL_COMMUNITY)
Admission: RE | Admit: 2018-04-18 | Discharge: 2018-04-18 | Disposition: A | Discharge status: Home / Self Care | Payer: Medicare HMO | Source: Ambulatory Visit | Attending: Cardiology | Admitting: Cardiology

## 2018-04-18 ENCOUNTER — Encounter (HOSPITAL_COMMUNITY): Payer: Self-pay | Admitting: Cardiology

## 2018-04-18 ENCOUNTER — Other Ambulatory Visit: Payer: Self-pay

## 2018-04-18 VITALS — BP 117/76 | HR 80 | Wt 191.5 lb

## 2018-04-18 DIAGNOSIS — Z7901 Long term (current) use of anticoagulants: Secondary | ICD-10-CM | POA: Insufficient documentation

## 2018-04-18 DIAGNOSIS — Z79899 Other long term (current) drug therapy: Secondary | ICD-10-CM | POA: Insufficient documentation

## 2018-04-18 DIAGNOSIS — G4733 Obstructive sleep apnea (adult) (pediatric): Secondary | ICD-10-CM | POA: Insufficient documentation

## 2018-04-18 DIAGNOSIS — Z955 Presence of coronary angioplasty implant and graft: Secondary | ICD-10-CM | POA: Diagnosis not present

## 2018-04-18 DIAGNOSIS — I481 Persistent atrial fibrillation: Secondary | ICD-10-CM | POA: Diagnosis not present

## 2018-04-18 DIAGNOSIS — R0600 Dyspnea, unspecified: Secondary | ICD-10-CM

## 2018-04-18 DIAGNOSIS — I110 Hypertensive heart disease with heart failure: Secondary | ICD-10-CM | POA: Diagnosis not present

## 2018-04-18 DIAGNOSIS — Z87891 Personal history of nicotine dependence: Secondary | ICD-10-CM | POA: Diagnosis not present

## 2018-04-18 DIAGNOSIS — I5032 Chronic diastolic (congestive) heart failure: Secondary | ICD-10-CM | POA: Diagnosis not present

## 2018-04-18 DIAGNOSIS — I252 Old myocardial infarction: Secondary | ICD-10-CM | POA: Diagnosis not present

## 2018-04-18 DIAGNOSIS — I2510 Atherosclerotic heart disease of native coronary artery without angina pectoris: Secondary | ICD-10-CM | POA: Diagnosis not present

## 2018-04-18 DIAGNOSIS — I4819 Other persistent atrial fibrillation: Secondary | ICD-10-CM

## 2018-04-18 DIAGNOSIS — I509 Heart failure, unspecified: Secondary | ICD-10-CM

## 2018-04-18 LAB — BASIC METABOLIC PANEL
BUN/Creatinine Ratio: 20 (ref 10–24)
BUN: 25 mg/dL (ref 8–27)
CALCIUM: 9.5 mg/dL (ref 8.6–10.2)
CHLORIDE: 91 mmol/L — AB (ref 96–106)
CO2: 30 mmol/L — AB (ref 20–29)
Creatinine, Ser: 1.25 mg/dL (ref 0.76–1.27)
GFR calc Af Amer: 65 mL/min/{1.73_m2} (ref >59)
GFR calc non Af Amer: 56 mL/min/{1.73_m2} — ABNORMAL LOW (ref >59)
GLUCOSE: 101 mg/dL — AB (ref 65–99)
POTASSIUM: 4.2 mmol/L (ref 3.5–5.2)
Sodium: 139 mmol/L (ref 134–144)

## 2018-04-18 MED ORDER — TORSEMIDE 20 MG PO TABS: 60.0000 mg | ORAL_TABLET | Freq: Two times a day (BID) | ORAL | 11 refills | Status: DC

## 2018-04-18 MED ORDER — POTASSIUM CHLORIDE CRYS ER 10 MEQ PO TBCR: 20.0000 meq | EXTENDED_RELEASE_TABLET | Freq: Two times a day (BID) | ORAL | 3 refills | Status: DC

## 2018-04-18 NOTE — Patient Instructions (Addendum)
Increase Torsemide  60 mg (3 tabs), twice a day  Increase Potassium 20 meq (2 tabs), twice a day  Consider [place] call when you want to proceed  Your physician has requested that you have a stress echocardiogram. For further information please visit [email or web address]. Please follow instruction sheet as given. (they will call you)    Your physician recommends that you return for lab work in: 3 weeks with Dr. Aundra Dubin

## 2018-04-19 ENCOUNTER — Telehealth (HOSPITAL_COMMUNITY): Payer: Self-pay | Admitting: Cardiology

## 2018-04-20 NOTE — Progress Notes (Signed)
Cardiology: Dr. Johnsie Cancel HF Cardiology: Dr. Aundra Dubin  77 y.o. with history of CAD, persistent atrial fibrillation, and chronic diastolic CHF was referred by Dr. Johnsie Cancel for evaluation of CHF.  Patient had initial PCI to RCA in 11/1998.  This was complicated by stent thrombosis with inferior STEMI in 08/1999 requiring PCI.   In 2017, he was noted to be in atrial fibrillation persistently.  He appears to have been in atrial fibrillation since that time.  He decided not to undergo cardioversion.  Last echo in 5/19 showed EF 50-55%, severe biatrial enlargement.   He was doing well until around 5/19. At that time, he noted his HR running higher and he developed peripheral edema.  He would "give out" much more easily.  No chest pain, just felt a severe lack of energy. He would get short of breath walking short distances.  He was started on torsemide.  He was admitted in 6/19 for diuresis (acute/chronic diastolic CHF).    He has been losing weight on torsemide.  He feels better since his hospitalization.  Currently, fatigued after walking 100 yards, not short of breath.  Fatigued with stairs.  He has a chronic cough.   ECG (7/19, personally reviewed): afib, low voltage, nonspecific T wave flattening  Labs (7/19): K 4.2, creatinine 1.25, NT-proBNP 3260  PMH: 1. Atrial fibrillation: Persistent since 2017.  2. HTN 3. OSA: Uses CPAP.  4. CAD: PCI to RCA in 11/1998.  - Stent thrombosis with inferior STEMI and PCI RCA in 08/1999.  5. Chronic diastolic CHF: Echo (6/81) with EF 50-55%, severe biatrial enlargement.  - 6/19 CHF exacerbation, hospitalized.    Social History   Socioeconomic History  . Marital status: Married    Spouse name: scarlett  . Number of children: 3  . Years of education: 15  . Highest education level: Not on file  Occupational History  . Occupation: retired  Scientific laboratory technician  . Financial resource strain: Not on file  . Food insecurity:    Worry: Not on file    Inability: Not on file   . Transportation needs:    Medical: Not on file    Non-medical: Not on file  Tobacco Use  . Smoking status: Former Smoker    Types: Cigarettes    Last attempt to quit: 11/02/1999    Years since quitting: 18.4  . Smokeless tobacco: Never Used  Substance and Sexual Activity  . Alcohol use: No  . Drug use: No  . Sexual activity: Not on file  Lifestyle  . Physical activity:    Days per week: Not on file    Minutes per session: Not on file  . Stress: Not on file  Relationships  . Social connections:    Talks on phone: Not on file    Gets together: Not on file    Attends religious service: Not on file    Active member of club or organization: Not on file    Attends meetings of clubs or organizations: Not on file    Relationship status: Not on file  . Intimate partner violence:    Fear of current or ex partner: Not on file    Emotionally abused: Not on file    Physically abused: Not on file    Forced sexual activity: Not on file  Other Topics Concern  . Not on file  Social History Narrative  . Not on file   Family History  Problem Relation Age of Onset  . Heart Problems Mother   .  Cancer Father    ROS: All systems reviewed and negative except as per HPI.   Current Outpatient Medications  Medication Sig Dispense Refill  . metoprolol tartrate (LOPRESSOR) 50 MG tablet Take 75 mg by mouth 2 (two) times daily.    . nitroGLYCERIN (NITROSTAT) 0.4 MG SL tablet Place 1 tablet (0.4 mg total) under the tongue every 5 (five) minutes as needed for chest pain (3 doses max). 25 tablet 3  . potassium chloride (K-DUR,KLOR-CON) 10 MEQ tablet Take 2 tablets (20 mEq total) by mouth 2 (two) times daily. 120 tablet 3  . pravastatin (PRAVACHOL) 40 MG tablet Take 1 tablet (40 mg total) by mouth every evening. 90 tablet 2  . rivaroxaban (XARELTO) 20 MG TABS tablet Take 1 tablet (20 mg total) by mouth daily with supper. 30 tablet 6  . spironolactone (ALDACTONE) 25 MG tablet Take 1 tablet (25 mg  total) by mouth daily. 30 tablet 3  . torsemide (DEMADEX) 20 MG tablet Take 3 tablets (60 mg total) by mouth 2 (two) times daily. 180 tablet 11   No current facility-administered medications for this encounter.    BP 117/76   Pulse 80   Wt 191 lb 8 oz (86.9 kg)   SpO2 93%   BMI 25.97 kg/m  General: NAD Neck: JVP 12-14 cm, no thyromegaly or thyroid nodule.  Lungs: Clear to auscultation bilaterally with normal respiratory effort. CV: Nondisplaced PMI.  Heart irregular S1/S2, no S3/S4, no murmur.  1+ ankle edema.  No carotid bruit.  Normal pedal pulses.  Abdomen: Soft, nontender, no hepatosplenomegaly, no distention.  Skin: Intact without lesions or rashes.  Neurologic: Alert and oriented x 3.  Psych: Normal affect. Extremities: No clubbing or cyanosis.  HEENT: Normal.   Assessment/Plan: 1. Chronic diastolic CHF: Echo in 5/97 with EF 50-55%, severe biatrial enlargement.  CHF in this situation may have been potentiated by persistent atrial fibrillation.  NYHA class II-III currently, on exam, he is still volume overloaded.  - Increase torsemide to 60 mg bid and KCl to 20 bid.   - BMET in 10 days.  - He would be a good Cardiomems candidate, discuss at next appt.  2. CAD: PCI to RCA, had stent thrombosis back in 2000.  No chest pain.  However, I do have some concern given fairly abrupt onset of dyspnea/fatigue that worsening coronary disease could play a role.  - I will arrange for Lexiscan Cardiolite for risk stratifaction/rule out ischemia.  - No ASA given Xarelto use.  - Continue pravastatin.  3 Atrial fibrillation: Persistent for at least 2 years never had a cardioversion.  Now with worsening CHF, likely potentiated by atrial fibrillation.  I think that a trial of conversion to NSR would be reasonable.  I do not think that he would stay in NSR with severe biatrial enlargement without an anti-arrhythmic.  - Continue Xarelto, metoprolol.   - We discussed Tikosyn.  I think he would be a  good candidate for this. He wants to think about it with his wife.  They will call us back if he would like an admission to initiate Tikosy.   [name] 04/21/2018

## 2018-04-24 ENCOUNTER — Other Ambulatory Visit: Payer: Self-pay | Admitting: Cardiovascular Disease

## 2018-04-24 DIAGNOSIS — I4892 Unspecified atrial flutter: Secondary | ICD-10-CM

## 2018-04-26 NOTE — Progress Notes (Deleted)
Cardiology: Dr. Johnsie Cancel HF Cardiology: Dr. Aundra Dubin   77 y.o. with recent exacerbation of diastolic CHF in setting of CAD and persistent afib. Seen by Dr Aundra Dubin in [place] clinic 04/21/18.  Had STEMI with stent thrombosis RCA in November 2000 Dr Aundra Dubin ordered a myovue on him *** Been in afib since 2017 and refused attempt at [facility]. Dr Aundra Dubin discussed hospitalization for Tikosyn but he wanted to "think about it"  Started having marked fatigue, edema and dyspnea in May of 2019.  Some improvement with torsemide increased to 60 mg bid with Kdur on 04/21/18.  BNP elevated on 04/09/18 3260   Echo reviewed 02/22/18 EF 50-55% Inferior MI no diastolic parameters due to MAC and afib Severe bi atrial enlargement no MR estimated PA 33 mmHg LA 49 mm   ***  PMH: 1. Atrial fibrillation: Persistent since 2017.  2. HTN 3. OSA: Uses CPAP.  4. CAD: PCI to RCA in 11/1998.  - Stent thrombosis with inferior STEMI and PCI RCA in 08/1999.  5. Chronic diastolic CHF: Echo (9/83) with EF 50-55%, severe biatrial enlargement.  - 6/19 CHF exacerbation, hospitalized.    Social History   Socioeconomic History  . Marital status: Married    Spouse name: scarlett  . Number of children: 3  . Years of education: 73  . Highest education level: Not on file  Occupational History  . Occupation: retired  Scientific laboratory technician  . Financial resource strain: Not on file  . Food insecurity:    Worry: Not on file    Inability: Not on file  . Transportation needs:    Medical: Not on file    Non-medical: Not on file  Tobacco Use  . Smoking status: Former Smoker    Types: Cigarettes    Last attempt to quit: 11/02/1999    Years since quitting: 18.4  . Smokeless tobacco: Never Used  Substance and Sexual Activity  . Alcohol use: No  . Drug use: No  . Sexual activity: Not on file  Lifestyle  . Physical activity:    Days per week: Not on file    Minutes per session: Not on file  . Stress: Not on file  Relationships  . Social connections:     Talks on phone: Not on file    Gets together: Not on file    Attends religious service: Not on file    Active member of club or organization: Not on file    Attends meetings of clubs or organizations: Not on file    Relationship status: Not on file  . Intimate partner violence:    Fear of current or ex partner: Not on file    Emotionally abused: Not on file    Physically abused: Not on file    Forced sexual activity: Not on file  Other Topics Concern  . Not on file  Social History Narrative  . Not on file   Family History  Problem Relation Age of Onset  . Heart Problems Mother   . Cancer Father    ROS: All systems reviewed and negative except as per HPI.   Current Outpatient Medications  Medication Sig Dispense Refill  . metoprolol tartrate (LOPRESSOR) 50 MG tablet Take 75 mg by mouth 2 (two) times daily.    . nitroGLYCERIN (NITROSTAT) 0.4 MG SL tablet Place 1 tablet (0.4 mg total) under the tongue every 5 (five) minutes as needed for chest pain (3 doses max). 25 tablet 3  . potassium chloride (K-DUR,KLOR-CON) 10 MEQ tablet  Take 2 tablets (20 mEq total) by mouth 2 (two) times daily. 120 tablet 3  . pravastatin (PRAVACHOL) 40 MG tablet Take 1 tablet (40 mg total) by mouth every evening. 90 tablet 2  . spironolactone (ALDACTONE) 25 MG tablet Take 1 tablet (25 mg total) by mouth daily. 30 tablet 3  . torsemide (DEMADEX) 20 MG tablet Take 3 tablets (60 mg total) by mouth 2 (two) times daily. 180 tablet 11  . XARELTO 20 MG TABS tablet TAKE 1 TABLET (20 MG TOTAL) BY MOUTH DAILY WITH SUPPER 30 tablet 6   No current facility-administered medications for this visit.    There were no vitals taken for this visit. Affect appropriate Healthy:  appears stated age 27: normal Neck supple with no adenopathy JVP normal no bruits no thyromegaly Lungs clear with no wheezing and good diaphragmatic motion Heart:  S1/S2 no murmur, no rub, gallop or click PMI normal Abdomen: benighn, BS  positve, no tenderness, no AAA no bruit.  No HSM or HJR Distal pulses intact with no bruits No edema Neuro non-focal Skin warm and dry No muscular weakness  ECG:  04/09/18 afib rate 80 low voltage   Assessment/Plan:  Diastolic CHF:  Torsemide increased 04/21/18 On aldactone .  CAD and chronic afib contributing *** CAD:  Myovue ordered ***   Afib:  Discussed tikosyn Rx with patient *** On xarelto and lopressor for rate control  HLD:  Continue Pravachol needs updated labs    [employer] 04/26/2018

## 2018-04-29 ENCOUNTER — Other Ambulatory Visit (HOSPITAL_COMMUNITY): Payer: Self-pay | Admitting: *Deleted

## 2018-04-29 ENCOUNTER — Ambulatory Visit (HOSPITAL_COMMUNITY)
Admission: RE | Admit: 2018-04-29 | Discharge: 2018-04-29 | Disposition: A | Discharge status: Home / Self Care | Payer: Medicare HMO | Source: Ambulatory Visit | Attending: Cardiology | Admitting: Cardiology

## 2018-04-29 DIAGNOSIS — R0600 Dyspnea, unspecified: Secondary | ICD-10-CM

## 2018-04-29 DIAGNOSIS — I5032 Chronic diastolic (congestive) heart failure: Secondary | ICD-10-CM | POA: Insufficient documentation

## 2018-04-29 DIAGNOSIS — E7800 Pure hypercholesterolemia, unspecified: Secondary | ICD-10-CM

## 2018-04-29 LAB — BASIC METABOLIC PANEL
ANION GAP: 14 (ref 5–15)
BUN: 21 mg/dL (ref 8–23)
CHLORIDE: 93 mmol/L — AB (ref 98–111)
CO2: 29 mmol/L (ref 22–32)
Calcium: 9.4 mg/dL (ref 8.9–10.3)
Creatinine, Ser: 1.31 mg/dL — ABNORMAL HIGH (ref 0.61–1.24)
GFR calc Af Amer: 60 mL/min — ABNORMAL LOW (ref >60)
GFR, EST NON AFRICAN AMERICAN: 52 mL/min — AB (ref >60)
GLUCOSE: 124 mg/dL — AB (ref 70–99)
POTASSIUM: 4.1 mmol/L (ref 3.5–5.1)
Sodium: 136 mmol/L (ref 135–145)

## 2018-04-29 LAB — CBC
HEMATOCRIT: 52.4 % — AB (ref 39.0–52.0)
HEMOGLOBIN: 16.3 g/dL (ref 13.0–17.0)
MCH: 26.7 pg (ref 26.0–34.0)
MCHC: 31.1 g/dL (ref 30.0–36.0)
MCV: 85.9 fL (ref 78.0–100.0)
PLATELETS: 200 10*3/uL (ref 150–400)
RBC: 6.1 MIL/uL — AB (ref 4.22–5.81)
RDW: 19.6 % — ABNORMAL HIGH (ref 11.5–15.5)
WBC: 8.6 10*3/uL (ref 4.0–10.5)

## 2018-04-29 NOTE — Telephone Encounter (Signed)
User: Larry [name] A Date/time: 04/19/18 1:11 PM  Comment: Called pt and lmsg for him to CB to get sch for an echo.Vassie Moment  Context:  Outcome: Left Message  Phone number: [phone] Phone Type: Home Phone  Comm. type: Telephone Call type: Outgoing  Contact: Larry [name] Relation to patient: Self   Spoke with patient today and wants to wait to speak with Dr. Aundra Dubin about Tikosyn before scheduling myoview..7/22.RG

## 2018-05-03 ENCOUNTER — Ambulatory Visit: Payer: Medicare HMO | Admitting: Cardiovascular Disease

## 2018-05-10 ENCOUNTER — Ambulatory Visit (HOSPITAL_COMMUNITY)
Admission: RE | Admit: 2018-05-10 | Discharge: 2018-05-10 | Disposition: A | Discharge status: Home / Self Care | Payer: Medicare HMO | Source: Ambulatory Visit | Attending: Cardiology | Admitting: Cardiology

## 2018-05-10 VITALS — BP 101/82 | HR 91 | Wt 182.4 lb

## 2018-05-10 DIAGNOSIS — I481 Persistent atrial fibrillation: Secondary | ICD-10-CM

## 2018-05-10 DIAGNOSIS — I5022 Chronic systolic (congestive) heart failure: Secondary | ICD-10-CM

## 2018-05-10 DIAGNOSIS — Z9989 Dependence on other enabling machines and devices: Secondary | ICD-10-CM | POA: Insufficient documentation

## 2018-05-10 DIAGNOSIS — Z8249 Family history of ischemic heart disease and other diseases of the circulatory system: Secondary | ICD-10-CM | POA: Diagnosis not present

## 2018-05-10 DIAGNOSIS — I5032 Chronic diastolic (congestive) heart failure: Secondary | ICD-10-CM | POA: Insufficient documentation

## 2018-05-10 DIAGNOSIS — I4891 Unspecified atrial fibrillation: Secondary | ICD-10-CM | POA: Insufficient documentation

## 2018-05-10 DIAGNOSIS — I2510 Atherosclerotic heart disease of native coronary artery without angina pectoris: Secondary | ICD-10-CM | POA: Insufficient documentation

## 2018-05-10 DIAGNOSIS — Z87891 Personal history of nicotine dependence: Secondary | ICD-10-CM | POA: Diagnosis not present

## 2018-05-10 DIAGNOSIS — Z809 Family history of malignant neoplasm, unspecified: Secondary | ICD-10-CM | POA: Insufficient documentation

## 2018-05-10 DIAGNOSIS — Z7901 Long term (current) use of anticoagulants: Secondary | ICD-10-CM | POA: Diagnosis not present

## 2018-05-10 DIAGNOSIS — I110 Hypertensive heart disease with heart failure: Secondary | ICD-10-CM | POA: Diagnosis not present

## 2018-05-10 DIAGNOSIS — G4733 Obstructive sleep apnea (adult) (pediatric): Secondary | ICD-10-CM | POA: Insufficient documentation

## 2018-05-10 DIAGNOSIS — Z955 Presence of coronary angioplasty implant and graft: Secondary | ICD-10-CM | POA: Insufficient documentation

## 2018-05-10 DIAGNOSIS — I252 Old myocardial infarction: Secondary | ICD-10-CM | POA: Insufficient documentation

## 2018-05-10 DIAGNOSIS — Z79899 Other long term (current) drug therapy: Secondary | ICD-10-CM | POA: Diagnosis not present

## 2018-05-10 DIAGNOSIS — I4819 Other persistent atrial fibrillation: Secondary | ICD-10-CM

## 2018-05-10 LAB — BASIC METABOLIC PANEL
Anion gap: 12 (ref 5–15)
BUN: 18 mg/dL (ref 8–23)
CALCIUM: 9.6 mg/dL (ref 8.9–10.3)
CO2: 28 mmol/L (ref 22–32)
CREATININE: 1.45 mg/dL — AB (ref 0.61–1.24)
Chloride: 94 mmol/L — ABNORMAL LOW (ref 98–111)
GFR, EST AFRICAN AMERICAN: 53 mL/min — AB (ref >60)
GFR, EST NON AFRICAN AMERICAN: 46 mL/min — AB (ref >60)
GLUCOSE: 153 mg/dL — AB (ref 70–99)
Potassium: 4 mmol/L (ref 3.5–5.1)
Sodium: 134 mmol/L — ABNORMAL LOW (ref 135–145)

## 2018-05-10 LAB — LIPID PANEL
Cholesterol: 101 mg/dL (ref 0–200)
HDL: 35 mg/dL — ABNORMAL LOW (ref >40)
LDL CALC: 54 mg/dL (ref 0–99)
Total CHOL/HDL Ratio: 2.9 RATIO
Triglycerides: 58 mg/dL (ref <150)
VLDL: 12 mg/dL (ref 0–40)

## 2018-05-10 LAB — MAGNESIUM: Magnesium: 2.1 mg/dL (ref 1.7–2.4)

## 2018-05-10 MED ORDER — POTASSIUM CHLORIDE CRYS ER 10 MEQ PO TBCR: 20.0000 meq | EXTENDED_RELEASE_TABLET | Freq: Two times a day (BID) | ORAL | 11 refills | Status: DC

## 2018-05-10 NOTE — Patient Instructions (Signed)
EKG today.  Routine lab work today. Will notify you of abnormal results, otherwise no news is good news!  Refilled Potassium.  Will refer to Afib clinic for Tikosyn loading. Same check in you did today, their clinic is located next to our office.  Follow up 6 weeks.  ___________________________________________________________________ Larry [name] Code:   Take all medication as prescribed the day of your appointment. Bring all medications with you to your appointment.  Do the following things EVERYDAY: 1) Weigh yourself in the morning before breakfast. Write it down and keep it in a log. 2) Take your medicines as prescribed 3) Eat low salt foods-Limit salt (sodium) to 2000 mg per day.  4) Stay as active as you can everyday 5) Limit all fluids for the day to less than 2 liters

## 2018-05-10 NOTE — Progress Notes (Signed)
Advanced Heart Failure Clinic Note  Cardiology: Dr. Johnsie Cancel HF Cardiology: Dr. Aundra Dubin  77 y.o. with history of CAD, persistent atrial fibrillation, and chronic diastolic CHF was referred by Dr. Johnsie Cancel for evaluation of CHF.  Patient had initial PCI to RCA in 11/1998.  This was complicated by stent thrombosis with inferior STEMI in 08/1999 requiring PCI.   In 2017, he was noted to be in atrial fibrillation persistently.  He appears to have been in atrial fibrillation since that time.  He decided not to undergo cardioversion.  Last echo in 5/19 showed EF 50-55%, severe biatrial enlargement.   He was doing well until around 5/19. At that time, he noted his HR running higher and he developed peripheral edema.  He would "give out" much more easily.  No chest pain, just felt a severe lack of energy. He would get short of breath walking short distances.  He was started on torsemide.  He was admitted in 6/19 for diuresis (acute/chronic diastolic CHF).    He presents today for followup of CHF.  Breathing is doing better, more energy.  Mild dyspnea with stairs but does ok on flat ground.  No chest pain.  No BRBPR/melena.  Weight is down 9 lbs.     ECG (personally reviewed): afib, low voltage, nonspecific T wave flattening  Labs (7/19): K 4.2, creatinine 1.25 => 1.3, NT-proBNP 3260, hgb 16.3  PMH: 1. Atrial fibrillation: Persistent since 2017.  2. HTN 3. OSA: Uses CPAP.  4. CAD: PCI to RCA in 11/1998.  - Stent thrombosis with inferior STEMI and PCI RCA in 08/1999.  5. Chronic diastolic CHF: Echo (7/41) with EF 50-55%, severe biatrial enlargement.  - 6/19 CHF exacerbation, hospitalized.    Social History   Socioeconomic History  . Marital status: Married    Spouse name: scarlett  . Number of children: 3  . Years of education: 42  . Highest education level: Not on file  Occupational History  . Occupation: retired  Scientific laboratory technician  . Financial resource strain: Not on file  . Food insecurity:   Worry: Not on file    Inability: Not on file  . Transportation needs:    Medical: Not on file    Non-medical: Not on file  Tobacco Use  . Smoking status: Former Smoker    Types: Cigarettes    Last attempt to quit: 11/02/1999    Years since quitting: 18.5  . Smokeless tobacco: Never Used  Substance and Sexual Activity  . Alcohol use: No  . Drug use: No  . Sexual activity: Not on file  Lifestyle  . Physical activity:    Days per week: Not on file    Minutes per session: Not on file  . Stress: Not on file  Relationships  . Social connections:    Talks on phone: Not on file    Gets together: Not on file    Attends religious service: Not on file    Active member of club or organization: Not on file    Attends meetings of clubs or organizations: Not on file    Relationship status: Not on file  . Intimate partner violence:    Fear of current or ex partner: Not on file    Emotionally abused: Not on file    Physically abused: Not on file    Forced sexual activity: Not on file  Other Topics Concern  . Not on file  Social History Narrative  . Not on file   Family History  Problem Relation Age of Onset  . Heart Problems Mother   . Cancer Father    Review of systems complete and found to be negative unless listed in HPI.   Current Outpatient Medications  Medication Sig Dispense Refill  . metoprolol tartrate (LOPRESSOR) 50 MG tablet Take 75 mg by mouth 2 (two) times daily.    . nitroGLYCERIN (NITROSTAT) 0.4 MG SL tablet Place 1 tablet (0.4 mg total) under the tongue every 5 (five) minutes as needed for chest pain (3 doses max). 25 tablet 3  . potassium chloride (K-DUR,KLOR-CON) 10 MEQ tablet Take 2 tablets (20 mEq total) by mouth 2 (two) times daily. 120 tablet 3  . pravastatin (PRAVACHOL) 40 MG tablet Take 1 tablet (40 mg total) by mouth every evening. 90 tablet 2  . spironolactone (ALDACTONE) 25 MG tablet Take 1 tablet (25 mg total) by mouth daily. 30 tablet 3  . torsemide  (DEMADEX) 20 MG tablet Take 3 tablets (60 mg total) by mouth 2 (two) times daily. 180 tablet 11  . XARELTO 20 MG TABS tablet TAKE 1 TABLET (20 MG TOTAL) BY MOUTH DAILY WITH SUPPER 30 tablet 6   No current facility-administered medications for this encounter.    BP 101/82   Pulse 91   Wt 182 lb 6.4 oz (82.7 kg)   SpO2 98%   BMI 24.74 kg/m   Wt Readings from Last 3 Encounters:  05/10/18 182 lb 6.4 oz (82.7 kg)  04/18/18 191 lb 8 oz (86.9 kg)  04/09/18 196 lb (88.9 kg)   General:No resp difficulty. HEENT: Normal Neck: Supple. JVP 8-9. Carotids 2+ bilat; no bruits. No thyromegaly or nodule noted. Cor: PMI nondisplaced. IRR, No M/G/R noted Lungs: CTAB, normal effort. Abdomen: Soft, non-tender, non-distended, no HSM. No bruits or masses. +BS  Extremities: No cyanosis, clubbing, or rash. Trace ankle edema Neuro: Alert & orientedx3, cranial nerves grossly intact. moves all 4 extremities w/o difficulty. Affect pleasant  Assessment/Plan: 1. Chronic diastolic CHF: Echo in 8/31 with EF 50-55%, severe biatrial enlargement.  CHF in this situation may have been potentiated by persistent atrial fibrillation.  NYHA class improved II. Volume status mildly overloaded, but much improved - Continue torsemide 60 mg bid and KCl 20 bid.  Potassium refilled today.  - BMET today - Discussed possible Cardiomems candidate 2. CAD: PCI to RCA, had stent thrombosis back in 2000.  No CP.  However, I do have some concern given fairly abrupt onset of dyspnea/fatigue that worsening coronary disease could play a role. Will hold off with further work up with marked improvement in symptoms with diuresis.  - No ASA given Xarelto use. Denies bleeding on xarelto. Samples provided today.  - Continue pravastatin.  - Check lipid panel today.  3 Atrial fibrillation: Persistent for at least 2 years never had a cardioversion.  Now with worsening CHF, likely potentiated by atrial fibrillation.  I think that a trial of  conversion to NSR would be reasonable.  I do not think that he would stay in NSR with severe biatrial enlargement without an anti-arrhythmic.  - Continue Xarelto, metoprolol. - Long discussion about benefit and risk of Tikosyn, along with what hospitalization looks like. He is willing to try Tikosyn. Provided patient assistance information to see if he qualifies.  - Refer to Afib clinic. Check BMET, mag, and EKG today.    BMET, mag, lipid panel today Refer to Afib clinic for [place] NP 05/10/2018  Patient seen with NP, agree with the  above note. He remains in atrial fibrillation.  NYHA class II symptoms, volume improving and weight down, still at least mildly volume overloaded.  - Continue current torsemide as weight is coming down.  - BMET, lipids, Mg today.  - I think an attempt at NSR would be helpful.  Unlikely to hold NSR with DCCV alone.  Therefore, I recommended Tikosyn admission.  He and his wife are amenable to this.  Will refer to atrial fibrillation clinic to arrange.   Followup in 6 wks.   [name] 05/12/2018

## 2018-05-12 ENCOUNTER — Telehealth: Payer: Self-pay | Admitting: Pharmacist

## 2018-05-12 NOTE — Telephone Encounter (Addendum)
Medication list reviewed in anticipation of upcoming Tikosyn initiation. Patient is not currently taking any contraindicated or QTc prolonging medications. Pt's most recent mag ok, potassium was borderline.   Patient is anticoagulated on Xarelto on the appropriate dose. Please ensure that patient has not missed any anticoagulation doses in the 3 weeks prior to Tikosyn initiation.   Patient will need to be counseled to avoid use of Benadryl while on Tikosyn and in the 2-3 days prior to Tikosyn initiation.

## 2018-05-14 ENCOUNTER — Ambulatory Visit (HOSPITAL_BASED_OUTPATIENT_CLINIC_OR_DEPARTMENT_OTHER)
Admission: RE | Admit: 2018-05-14 | Discharge: 2018-05-14 | Disposition: A | Discharge status: Home / Self Care | Payer: Medicare HMO | Source: Ambulatory Visit | Attending: Nurse Practitioner | Admitting: Nurse Practitioner

## 2018-05-14 ENCOUNTER — Encounter (HOSPITAL_COMMUNITY): Payer: Self-pay | Admitting: *Deleted

## 2018-05-14 ENCOUNTER — Inpatient Hospital Stay (HOSPITAL_COMMUNITY)
Admission: RE | Admit: 2018-05-14 | Discharge: 2018-05-17 | DRG: 309 | Disposition: A | Discharge status: Home / Self Care | Payer: Medicare HMO | Source: Ambulatory Visit | Attending: Internal Medicine | Admitting: Internal Medicine

## 2018-05-14 ENCOUNTER — Encounter (HOSPITAL_COMMUNITY): Payer: Self-pay | Admitting: Nurse Practitioner

## 2018-05-14 ENCOUNTER — Other Ambulatory Visit: Payer: Self-pay

## 2018-05-14 VITALS — BP 102/64 | HR 103 | Ht 72.0 in | Wt 182.0 lb

## 2018-05-14 DIAGNOSIS — E7800 Pure hypercholesterolemia, unspecified: Secondary | ICD-10-CM | POA: Diagnosis not present

## 2018-05-14 DIAGNOSIS — Z5181 Encounter for therapeutic drug level monitoring: Secondary | ICD-10-CM

## 2018-05-14 DIAGNOSIS — Z7901 Long term (current) use of anticoagulants: Secondary | ICD-10-CM | POA: Diagnosis not present

## 2018-05-14 DIAGNOSIS — Z888 Allergy status to other drugs, medicaments and biological substances status: Secondary | ICD-10-CM | POA: Diagnosis not present

## 2018-05-14 DIAGNOSIS — I4892 Unspecified atrial flutter: Secondary | ICD-10-CM | POA: Diagnosis present

## 2018-05-14 DIAGNOSIS — Z87891 Personal history of nicotine dependence: Secondary | ICD-10-CM | POA: Diagnosis not present

## 2018-05-14 DIAGNOSIS — I481 Persistent atrial fibrillation: Secondary | ICD-10-CM | POA: Diagnosis not present

## 2018-05-14 DIAGNOSIS — I252 Old myocardial infarction: Secondary | ICD-10-CM | POA: Diagnosis not present

## 2018-05-14 DIAGNOSIS — N183 Chronic kidney disease, stage 3 (moderate): Secondary | ICD-10-CM | POA: Diagnosis present

## 2018-05-14 DIAGNOSIS — G4733 Obstructive sleep apnea (adult) (pediatric): Secondary | ICD-10-CM | POA: Diagnosis not present

## 2018-05-14 DIAGNOSIS — I2510 Atherosclerotic heart disease of native coronary artery without angina pectoris: Secondary | ICD-10-CM | POA: Diagnosis not present

## 2018-05-14 DIAGNOSIS — I130 Hypertensive heart and chronic kidney disease with heart failure and stage 1 through stage 4 chronic kidney disease, or unspecified chronic kidney disease: Secondary | ICD-10-CM | POA: Diagnosis present

## 2018-05-14 DIAGNOSIS — Z79899 Other long term (current) drug therapy: Secondary | ICD-10-CM | POA: Diagnosis not present

## 2018-05-14 DIAGNOSIS — I10 Essential (primary) hypertension: Secondary | ICD-10-CM

## 2018-05-14 DIAGNOSIS — I5032 Chronic diastolic (congestive) heart failure: Secondary | ICD-10-CM | POA: Diagnosis not present

## 2018-05-14 DIAGNOSIS — I4819 Other persistent atrial fibrillation: Secondary | ICD-10-CM

## 2018-05-14 DIAGNOSIS — Z955 Presence of coronary angioplasty implant and graft: Secondary | ICD-10-CM

## 2018-05-14 LAB — BASIC METABOLIC PANEL
ANION GAP: 14 (ref 5–15)
BUN: 23 mg/dL (ref 8–23)
CALCIUM: 9.2 mg/dL (ref 8.9–10.3)
CHLORIDE: 89 mmol/L — AB (ref 98–111)
CO2: 29 mmol/L (ref 22–32)
Creatinine, Ser: 1.54 mg/dL — ABNORMAL HIGH (ref 0.61–1.24)
GFR calc non Af Amer: 42 mL/min — ABNORMAL LOW (ref >60)
GFR, EST AFRICAN AMERICAN: 49 mL/min — AB (ref >60)
Glucose, Bld: 211 mg/dL — ABNORMAL HIGH (ref 70–99)
Potassium: 4.1 mmol/L (ref 3.5–5.1)
SODIUM: 132 mmol/L — AB (ref 135–145)

## 2018-05-14 LAB — MAGNESIUM: MAGNESIUM: 2.4 mg/dL (ref 1.7–2.4)

## 2018-05-14 MED ORDER — POTASSIUM CHLORIDE CRYS ER 20 MEQ PO TBCR
20.0000 meq | EXTENDED_RELEASE_TABLET | Freq: Two times a day (BID) | ORAL | Status: DC
  Administered 2018-05-14 – 2018-05-17 (×6): 20 meq via ORAL
  Filled 2018-05-14 (×6): qty 1

## 2018-05-14 MED ORDER — SODIUM CHLORIDE 0.9 % IV SOLN: 250.0000 mL | INTRAVENOUS | Status: DC | PRN

## 2018-05-14 MED ORDER — PRAVASTATIN SODIUM 40 MG PO TABS
40.0000 mg | ORAL_TABLET | Freq: Every evening | ORAL | Status: DC
  Administered 2018-05-14 – 2018-05-16 (×3): 40 mg via ORAL
  Filled 2018-05-14 (×6): qty 1

## 2018-05-14 MED ORDER — NITROGLYCERIN 0.4 MG SL SUBL: 0.4000 mg | SUBLINGUAL_TABLET | SUBLINGUAL | Status: DC | PRN

## 2018-05-14 MED ORDER — SODIUM CHLORIDE 0.9% FLUSH
3.0000 mL | Freq: Two times a day (BID) | INTRAVENOUS | Status: DC
  Administered 2018-05-14 – 2018-05-16 (×5): 3 mL via INTRAVENOUS

## 2018-05-14 MED ORDER — METOPROLOL TARTRATE 50 MG PO TABS
75.0000 mg | ORAL_TABLET | Freq: Two times a day (BID) | ORAL | Status: DC
  Administered 2018-05-14 – 2018-05-17 (×6): 75 mg via ORAL
  Filled 2018-05-14 (×6): qty 1

## 2018-05-14 MED ORDER — RIVAROXABAN 20 MG PO TABS: 20.0000 mg | ORAL_TABLET | Freq: Every day | ORAL | Status: DC

## 2018-05-14 MED ORDER — SODIUM CHLORIDE 0.9% FLUSH: 3.0000 mL | INTRAVENOUS | Status: DC | PRN

## 2018-05-14 MED ORDER — ACETAMINOPHEN 500 MG PO TABS: 500.0000 mg | ORAL_TABLET | Freq: Four times a day (QID) | ORAL | Status: DC | PRN

## 2018-05-14 MED ORDER — TORSEMIDE 20 MG PO TABS
60.0000 mg | ORAL_TABLET | Freq: Two times a day (BID) | ORAL | Status: DC
  Administered 2018-05-15 – 2018-05-17 (×5): 60 mg via ORAL
  Filled 2018-05-14 (×6): qty 3

## 2018-05-14 MED ORDER — DOFETILIDE 250 MCG PO CAPS
250.0000 ug | ORAL_CAPSULE | Freq: Two times a day (BID) | ORAL | Status: DC
  Administered 2018-05-14 – 2018-05-17 (×6): 250 ug via ORAL
  Filled 2018-05-14 (×7): qty 1

## 2018-05-14 MED ORDER — SPIRONOLACTONE 25 MG PO TABS
25.0000 mg | ORAL_TABLET | Freq: Every day | ORAL | Status: DC
  Administered 2018-05-15 – 2018-05-17 (×3): 25 mg via ORAL
  Filled 2018-05-14 (×3): qty 1

## 2018-05-14 NOTE — H&P (Addendum)
Cardiology Admission History and Physical:   Patient ID: Larry [name]; MRN: [id number]; DOB: [birth year]   Admission date: 05/14/2018  Primary Care Provider: Patient, No Pcp Per Primary Cardiologist: Dr. Johnsie Cancel AHF: Dr. Aundra Dubin  Chief Complaint:  Here for [place] admission  Patient Profile:   Larry [name] is a 77 y.o. male with a history of CAD, chronic diastolic CHF, HTN, and OSA w/CPAP.  New AFib last year, without symptoms, decided not to pursue DCCV.  History of Present Illness:   Mr. Bozzi this year had a CHF exacerbation hospitalization, in f/u recently with CHF team, was decided to try and pursue AAD and rhythm control strategies.  He was referred to the Afib clinic for evaluation and management for Tikosyn intiation.  Hew was seen by Roderic Palau, NP today, confirmed no missed doses of his Xarelto, no benadryl use.  Med list was reviewed by our [facility] noting no contraindicated medicines.  He feels well though relays poor energy, easily winded that is being attributed to his AFib.  No CP, no dizziness, near syncope or syncope  He confirms with me no missed doses of Xarelto in 3 weeks (longer).  We discussed Tikosyn, potential risks and benefits, load protocol, and planned DCCV (as well as this procedure, its potential risks and benefits)Thursday if not in SR and he would like to proceed    Past Medical History:  Diagnosis Date  . Coronary artery disease    status post remote PCI of the mid to distal RCA 11/1998 followed by acute ST elevation MI inferiorly 08/1999 with occlusion of the prior RCA stent as well as 70% diagonal branch.  He underwent PCI of the RCA  . Hypercholesterolemia   . Hypertension   . Myocardial infarct ([place])   . Olecranon bursitis     Past Surgical History:  Procedure Laterality Date  . CARDIAC CATHETERIZATION  12/15/1999   EF was 55%   . CORONARY ANGIOPLASTY WITH STENT PLACEMENT  08/15/1999   CAD, status post prior stenting of the mid to distal RCA  in 11/1998, with an acute diaphragmatic wall infarction due to total occlusion at the stent site/There is also 70% narrowing in the diagonal branch of the LAD/The LV showed inferior wall hypo- akinesis.-- Successful reperfusion, percutaneous transluminal coronary percutaneous transluminal coronary & placement of a 2nd overlying stent in RCA     Medications Prior to Admission: Prior to Admission medications   Medication Sig Start Date End Date Taking? Authorizing Provider  acetaminophen (TYLENOL) 500 MG tablet Take 500 mg by mouth every 6 (six) hours as needed.    [provider]  metoprolol tartrate (LOPRESSOR) 50 MG tablet Take 75 mg by mouth 2 (two) times daily.    [provider]  nitroGLYCERIN (NITROSTAT) 0.4 MG SL tablet Place 1 tablet (0.4 mg total) under the tongue every 5 (five) minutes as needed for chest pain (3 doses max). 02/19/18   Imogene Burn, PA-C  potassium chloride (K-DUR,KLOR-CON) 10 MEQ tablet Take 2 tablets (20 mEq total) by mouth 2 (two) times daily. 05/10/18   Larey Dresser, MD  pravastatin (PRAVACHOL) 40 MG tablet Take 1 tablet (40 mg total) by mouth every evening. 12/24/17   Josue Hector, MD  spironolactone (ALDACTONE) 25 MG tablet Take 1 tablet (25 mg total) by mouth daily. 03/29/18   Rai, Ripudeep Raliegh Ip, MD  torsemide (DEMADEX) 20 MG tablet Take 3 tablets (60 mg total) by mouth 2 (two) times daily. 04/18/18   Larey Dresser, MD  XARELTO 20 MG TABS tablet TAKE 1 TABLET (20 MG TOTAL) BY MOUTH DAILY WITH SUPPER 04/24/18   Josue Hector, MD     Allergies:    Allergies  Allergen Reactions  . Plavix [Clopidogrel Bisulfate] Anaphylaxis         Social History:   Social History   Socioeconomic History  . Marital status: Married    Spouse name: scarlett  . Number of children: 3  . Years of education: 28  . Highest education level: Not on file  Occupational History  . Occupation: retired  Scientific laboratory technician  . Financial resource strain: Not on file    . Food insecurity:    Worry: Not on file    Inability: Not on file  . Transportation needs:    Medical: Not on file    Non-medical: Not on file  Tobacco Use  . Smoking status: Former Smoker    Types: Cigarettes    Last attempt to quit: 11/02/1999    Years since quitting: 18.5  . Smokeless tobacco: Never Used  Substance and Sexual Activity  . Alcohol use: No  . Drug use: No  . Sexual activity: Not on file  Lifestyle  . Physical activity:    Days per week: Not on file    Minutes per session: Not on file  . Stress: Not on file  Relationships  . Social connections:    Talks on phone: Not on file    Gets together: Not on file    Attends religious service: Not on file    Active member of club or organization: Not on file    Attends meetings of clubs or organizations: Not on file    Relationship status: Not on file  . Intimate partner violence:    Fear of current or ex partner: Not on file    Emotionally abused: Not on file    Physically abused: Not on file    Forced sexual activity: Not on file  Other Topics Concern  . Not on file  Social History Narrative  . Not on file    Family History:  The patient's family history includes Cancer in his father; Heart Problems in his mother.    ROS:  Please see the history of present illness.  All other ROS reviewed and negative.     Physical Exam/Data:   Vitals:   05/14/18 1500  Weight: 184 lb 11.2 oz (83.8 kg)  Height: 6' (1.829 m)   No intake or output data in the 24 hours ending 05/14/18 1558 Filed Weights   05/14/18 1500  Weight: 184 lb 11.2 oz (83.8 kg)   Body mass index is 25.05 kg/m.  General:  Well nourished, well developed, in no acute distress HEENT: normal Lymph: no adenopathy Neck: no JVD Endocrine:  No thryomegaly Vascular: No carotid bruits  Cardiac:  iRRR; no murmurs, gallops or rubs Lungs:  CTA b/l, no wheezing, rhonchi or rales  Abd: soft, nontender, no hepatomegaly  Ext: no edema Musculoskeletal:   No deformities, age appropriate atrophy Skin: warm and dry  Neuro: no gross focal abnormalities noted Psych:  Normal affect    EKG:  The ECG that was done today was personally reviewed and demonstrates  AFib/flutter 103bpm, difficult to establish QT in this course AFib/flutter, Dr. Ky Barban has reviewed the patient's EKG, cleared for start of drug   Relevant CV Studies:  02/22/18; TTE Study Conclusions - Left ventricle: The cavity size was normal. Wall thickness was   normal. Systolic  function was at the lower limits of normal. The   estimated ejection fraction was in the range of 50% to 55%.   Severe hypokinesis of the basalinferolateral, inferior, and   inferoseptal myocardium; consistent with infarction in the   distribution of the right coronary artery. - Mitral valve: Calcified annulus. - Left atrium: The atrium was severely dilated. (29mm) - Right atrium: The atrium was severely dilated. - Pulmonary arteries: Systolic pressure was mildly increased. PA   peak pressure: 33 mm Hg (S). - Pericardium, extracardiac: A small pericardial effusion was   identified circumferential to the heart. There was no evidence of   hemodynamic compromise.  Laboratory Data:  Chemistry Recent Labs  Lab 05/10/18 1200 05/14/18 1026  NA 134* 132*  K 4.0 4.1  CL 94* 89*  CO2 28 29  GLUCOSE 153* 211*  BUN 18 23  CREATININE 1.45* 1.54*  CALCIUM 9.6 9.2  GFRNONAA 46* 42*  GFRAA 53* 49*  ANIONGAP 12 14    No results for input(s): PROT, ALBUMIN, AST, ALT, ALKPHOS, BILITOT in the last 168 hours. HematologyNo results for input(s): WBC, RBC, HGB, HCT, MCV, MCH, MCHC, RDW, PLT in the last 168 hours. Cardiac EnzymesNo results for input(s): TROPONINI in the last 168 hours. No results for input(s): TROPIPOC in the last 168 hours.  BNPNo results for input(s): BNP, PROBNP in the last 168 hours.  DDimer No results for input(s): DDIMER in the last 168 hours.  Radiology/Studies:  No results  found.  Assessment and Plan:   1. Persistent Afib     Here for Tikosyn load     CHA2DS2Vasc is 3, on Xarelto (continue 20mg  dosing, follow Creat)     K+ 4.1     Mag 2.4     Creat 1.54 (Calc CrCl is 49) has been higher then usual of late, follow     EKGs have been reviewed by Dr. Rayann Heman, OK to start Tikosyn  Will start at 252mcg dosing given Creat Cl of 49 Plan DCCV on Thursday if not in [place] assistance paperwork is pending in AFib clinic  2. CAD     No anginal complaints  3. Chronic CHF (diastolic)     No exam findings to suggest fluid OL  4. HTN     Continue home regime  5. OSA     Reports compliance with home CPAP, will order for here     For questions or updates, please contact [place] Please consult [email or web address] for contact info under Cardiology/STEMI.    Signed, Baldwin Jamaica, PA-C  05/14/2018 3:58 PM   I have seen, examined the patient, and reviewed the above assessment and plan.  Changes to above are made where necessary.  On exam, iRRR.  Pt is admitted for initiation of tikosyn. Currently in coarse afib.  If he does not convert to sinus, will require cardioversion on Thursday.  Reports compliance with anticoagulation without interruption.  Co Sign: Thompson Grayer, MD 05/14/2018 5:37 PM

## 2018-05-14 NOTE — Progress Notes (Signed)
Pharmacy Review for Dofetilide (Tikosyn) Initiation  Admit Complaint: 77 y.o. male admitted 05/14/2018 with atrial fibrillation to be initiated on dofetilide.   Assessment:  Patient Exclusion Criteria: If any screening criteria checked as "Yes", then  patient  should NOT receive dofetilide until criteria item is corrected. If "Yes" please indicate correction plan.  YES  NO Patient  Exclusion Criteria Correction Plan  [x]  []  Baseline QTc interval is greater than or equal to 440 msec. IF above YES box checked dofetilide contraindicated unless patient has ICD; then may proceed if QTc 500-550 msec or with known ventricular conduction abnormalities may proceed with QTc 550-600 msec. QTc =  589 (8/6 at 1027)  Per MD notes: The ECG that was done today was personally reviewed and demonstrates  AFib/flutter 103bpm, difficult to establish QT in this course AFib/flutter, Dr. Ky Barban has reviewed the patient's EKG, cleared for start of drug.     []  [x]  Magnesium level is less than 1.8 mEq/l : Last magnesium:  Lab Results  Component Value Date   MG 2.4 05/14/2018         []  [x]  Potassium level is less than 4 mEq/l : Last potassium:  Lab Results  Component Value Date   K 4.1 05/14/2018         []  [x]  Patient is known or suspected to have a digoxin level greater than 2 ng/ml: No results found for: DIGOXIN    []  [x]  Creatinine clearance less than 20 ml/min (calculated using Cockcroft-Gault, actual body weight and serum creatinine): Estimated Creatinine Clearance: 45.5 mL/min (A) (by C-G formula based on SCr of 1.54 mg/dL (H)).   CrCl actually 49 ml/min using TBW.    []  [x]  Patient has received drugs known to prolong the QT intervals within the last 48 hours (phenothiazines, tricyclics or tetracyclic antidepressants, erythromycin, H-1 antihistamines, cisapride, fluoroquinolones, azithromycin). Drugs not listed above may have an, as yet, undetected potential to prolong the QT interval, updated  information on QT prolonging agents is available at this website:QT prolonging agents   []  [x]  Patient received a dose of hydrochlorothiazide (Oretic) alone or in any combination including triamterene (Dyazide, Maxzide) in the last 48 hours.   []  [x]  Patient received a medication known to increase dofetilide plasma concentrations prior to initial dofetilide dose:  . Trimethoprim (Primsol, Proloprim) in the last 36 hours . Verapamil (Calan, Verelan) in the last 36 hours or a sustained release dose in the last 72 hours . Megestrol (Megace) in the last 5 days  . Cimetidine (Tagamet) in the last 6 hours . Ketoconazole (Nizoral) in the last 24 hours . Itraconazole (Sporanox) in the last 48 hours  . Prochlorperazine (Compazine) in the last 36 hours    []  [x]  Patient is known to have a history of torsades de pointes; congenital or acquired long QT syndromes.   []  [x]  Patient has received a Class 1 antiarrhythmic with less than 2 half-lives since last dose. (Disopyramide, Quinidine, Procainamide, Lidocaine, Mexiletine, Flecainide, Propafenone)   []  [x]  Patient has received amiodarone therapy in the past 3 months or amiodarone level is greater than 0.3 ng/ml.    Patient has been appropriately anticoagulated with Xarelto 20mg  daily with supper.  CrCl is borderline (48ml/min using TBW) for dose adjustment to 15mg  daily dose.  Rx will follow.  Ordering provider was confirmed at [email or web address] if they are not listed on the [place] Prescribers list.  Goal of Therapy: Follow renal function, electrolytes, potential drug interactions, and dose adjustment. Provide education  and 1 week supply at discharge.  Plan:  [x]   Physician selected initial dose within range recommended for patients level of renal function - will monitor for response.  []   Physician selected initial dose outside of range recommended for patients level of renal function - will discuss if the dose should be altered at this  time.   [facility] Calculated CrCl  Dose q12h  []  > 60 ml/min 500 mcg  [x]  40-60 ml/min 250 mcg  []  20-40 ml/min 125 mcg   2. Follow up QTc after the first 5 doses, renal function, electrolytes (K & Mg) daily x 3 days, dose adjustment, success of initiation and facilitate 1 week discharge supply as clinically indicated.  3. Initiate Tikosyn education video (Call [phone] and ask for video # 116).  4. Place Enrollment Form on the chart for discharge supply of dofetilide.   Elmer Ramp 4:56 PM 05/14/2018

## 2018-05-14 NOTE — Progress Notes (Signed)
Primary Care Physician: Patient, No Pcp Per Referring Physician: Dr. Johnsie Cancel Cardiologist: Dr. Kriste Basque Larry [name] is a 77 y.o. male with a h/o hyperlipidemia, CAD, multiple stents of the RCA  that presented to his routine office visit with Dr. Johnsie Cancel, 12/01/16 and asymptomatic flutter was found on EKG.He was clear that he was  asymptomatic and did not want to purse cardioversion, He was  started on xarelto 20 mg for a chadsvasc score of at least 3.  He continued to be asymptomatic until the last few months and was referred to Dr. Aundra Dubin by Dr. Johnsie Cancel 05/10/18.  For CHF. He was admitted this past June for diuresis. Dr. Aundra Dubin thought his recent symptoms could be 2/2 chronic afib and wanted him to try Tikosyn to see if SR could be restored and if his symptoms would improve.  He is in the afib clinic for [facility] [place]. No benadryl use and no missed does of anticoagulation. Pt does fit guidelines for Tikosyn assistance and his forms have been filled out.  Today, he denies symptoms of palpitations, chest pain, shortness of breath, orthopnea, PND, lower extremity edema, dizziness, presyncope, syncope, or neurologic sequela. The patient is tolerating medications without difficulties and is otherwise without complaint today.   Past Medical History:  Diagnosis Date  . Coronary artery disease    status post remote PCI of the mid to distal RCA 11/1998 followed by acute ST elevation MI inferiorly 08/1999 with occlusion of the prior RCA stent as well as 70% diagonal branch.  He underwent PCI of the RCA  . Hypercholesterolemia   . Hypertension   . Myocardial infarct ([place])   . Olecranon bursitis    Past Surgical History:  Procedure Laterality Date  . CARDIAC CATHETERIZATION  12/15/1999   EF was 55%   . CORONARY ANGIOPLASTY WITH STENT PLACEMENT  08/15/1999   CAD, status post prior stenting of the mid to distal RCA in 11/1998, with an acute diaphragmatic wall infarction due to total occlusion at  the stent site/There is also 70% narrowing in the diagonal branch of the LAD/The LV showed inferior wall hypo- akinesis.-- Successful reperfusion, percutaneous transluminal coronary percutaneous transluminal coronary & placement of a 2nd overlying stent in RCA    Current Outpatient Medications  Medication Sig Dispense Refill  . metoprolol tartrate (LOPRESSOR) 50 MG tablet Take 75 mg by mouth 2 (two) times daily.    . nitroGLYCERIN (NITROSTAT) 0.4 MG SL tablet Place 1 tablet (0.4 mg total) under the tongue every 5 (five) minutes as needed for chest pain (3 doses max). 25 tablet 3  . potassium chloride (K-DUR,KLOR-CON) 10 MEQ tablet Take 2 tablets (20 mEq total) by mouth 2 (two) times daily. 120 tablet 11  . pravastatin (PRAVACHOL) 40 MG tablet Take 1 tablet (40 mg total) by mouth every evening. 90 tablet 2  . spironolactone (ALDACTONE) 25 MG tablet Take 1 tablet (25 mg total) by mouth daily. 30 tablet 3  . torsemide (DEMADEX) 20 MG tablet Take 3 tablets (60 mg total) by mouth 2 (two) times daily. 180 tablet 11  . XARELTO 20 MG TABS tablet TAKE 1 TABLET (20 MG TOTAL) BY MOUTH DAILY WITH SUPPER 30 tablet 6   No current facility-administered medications for this encounter.     Allergies  Allergen Reactions  . Plavix [Clopidogrel Bisulfate] Anaphylaxis         Social History   Socioeconomic History  . Marital status: Married    Spouse name: scarlett  .  Number of children: 3  . Years of education: 65  . Highest education level: Not on file  Occupational History  . Occupation: retired  Scientific laboratory technician  . Financial resource strain: Not on file  . Food insecurity:    Worry: Not on file    Inability: Not on file  . Transportation needs:    Medical: Not on file    Non-medical: Not on file  Tobacco Use  . Smoking status: Former Smoker    Types: Cigarettes    Last attempt to quit: 11/02/1999    Years since quitting: 18.5  . Smokeless tobacco: Never Used  Substance and Sexual Activity    . Alcohol use: No  . Drug use: No  . Sexual activity: Not on file  Lifestyle  . Physical activity:    Days per week: Not on file    Minutes per session: Not on file  . Stress: Not on file  Relationships  . Social connections:    Talks on phone: Not on file    Gets together: Not on file    Attends religious service: Not on file    Active member of club or organization: Not on file    Attends meetings of clubs or organizations: Not on file    Relationship status: Not on file  . Intimate partner violence:    Fear of current or ex partner: Not on file    Emotionally abused: Not on file    Physically abused: Not on file    Forced sexual activity: Not on file  Other Topics Concern  . Not on file  Social History Narrative  . Not on file    Family History  Problem Relation Age of Onset  . Heart Problems Mother   . Cancer Father     ROS- All systems are reviewed and negative except as per the HPI above  Physical Exam: Vitals:   05/14/18 1027  BP: 102/64  Pulse: (!) 103  Weight: 182 lb (82.6 kg)  Height: 6' (1.829 m)   Wt Readings from Last 3 Encounters:  05/14/18 182 lb (82.6 kg)  05/10/18 182 lb 6.4 oz (82.7 kg)  04/18/18 191 lb 8 oz (86.9 kg)    Labs: Lab Results  Component Value Date   NA 132 (L) 05/14/2018   K 4.1 05/14/2018   CL 89 (L) 05/14/2018   CO2 29 05/14/2018   GLUCOSE 211 (H) 05/14/2018   BUN 23 05/14/2018   CREATININE 1.54 (H) 05/14/2018   CALCIUM 9.2 05/14/2018   MG 2.4 05/14/2018   No results found for: INR Lab Results  Component Value Date   CHOL 101 05/10/2018   HDL 35 (L) 05/10/2018   LDLCALC 54 05/10/2018   TRIG 58 05/10/2018     GEN- The patient is well appearing, alert and oriented x 3 today.   Head- normocephalic, atraumatic Eyes-  Sclera clear, conjunctiva pink Ears- hearing intact Oropharynx- clear Neck- supple, no JVP Lymph- no cervical lymphadenopathy Lungs- Clear to ausculation bilaterally, normal work of  breathing Heart- irregular rate and rhythm, no murmurs, rubs or gallops, PMI not laterally displaced GI- soft, NT, ND, + BS Extremities- no clubbing, cyanosis, or edema MS- no significant deformity or atrophy Skin- no rash or lesion Psych- euthymic mood, full affect Neuro- strength and sensation are intact  EKG-afib/flutter, rate controlled at 103 bpm, qrs int 72 ms, qtc 589, but does not appear that long and qtc from last week was  397 ms( Dr. Rayann Heman  to review) Epic records reviewed Echo- 2019-- Left ventricle: The cavity size was normal. Wall thickness was   normal. Systolic function was at the lower limits of normal. The   estimated ejection fraction was in the range of 50% to 55%.   Severe hypokinesis of the basalinferolateral, inferior, and   inferoseptal myocardium; consistent with infarction in the   distribution of the right coronary artery. - Mitral valve: Calcified annulus. - Left atrium: The atrium was severely dilated. - Right atrium: The atrium was severely dilated. - Pulmonary arteries: Systolic pressure was mildly increased. PA   peak pressure: 33 mm Hg (S). - Pericardium, extracardiac: A small pericardial effusion was   identified circumferential to the heart. There was no evidence of   hemodynamic compromise.    Assessment and Plan: 1. Afib/flutter Pt has been in persistently since late 2017, early 2018. Was asymptomatic until the last few months, now with fatigue and shortness of breath and hospitalization for diureses in June Pt initially resisted any treatment for afib, since he was asymptomatic Dr. Aundra Dubin evaluated pt last week and he is here at his recommendation to pursue tikosyn He does have severely dilated left and rt atrium which may undermine ability to restore or maintain SR long term No missed doses of xarelto 20 mg daily with a CHA2DS2VASc score of at least 3 No benadryl use Will meet guidelines for Tikosyn assistance and have turned in their forms   Labs today show a K+ of 4.1 and a magnesium of 2.4, creatinine at 1.54,( higher than previous)  crcl cal at 48.39 so will qualify by this lab 250 mcg bid  Bed is not available so pt and wife will go eat lunch  and await call from admissions  Donna C. Carroll, [facility] [address] [place], [place] [address] [phone]

## 2018-05-15 LAB — BASIC METABOLIC PANEL
Anion gap: 13 (ref 5–15)
BUN: 22 mg/dL (ref 8–23)
CHLORIDE: 90 mmol/L — AB (ref 98–111)
CO2: 28 mmol/L (ref 22–32)
CREATININE: 1.43 mg/dL — AB (ref 0.61–1.24)
Calcium: 9 mg/dL (ref 8.9–10.3)
GFR, EST AFRICAN AMERICAN: 54 mL/min — AB (ref >60)
GFR, EST NON AFRICAN AMERICAN: 46 mL/min — AB (ref >60)
Glucose, Bld: 137 mg/dL — ABNORMAL HIGH (ref 70–99)
POTASSIUM: 4.4 mmol/L (ref 3.5–5.1)
Sodium: 131 mmol/L — ABNORMAL LOW (ref 135–145)

## 2018-05-15 LAB — MAGNESIUM: MAGNESIUM: 2.3 mg/dL (ref 1.7–2.4)

## 2018-05-15 MED ORDER — RIVAROXABAN 20 MG PO TABS
20.0000 mg | ORAL_TABLET | Freq: Every day | ORAL | Status: DC
  Administered 2018-05-15 – 2018-05-17 (×3): 20 mg via ORAL
  Filled 2018-05-15 (×3): qty 1

## 2018-05-15 NOTE — Care Management (Signed)
05-15-18 BENEFITS CHECK :  # 5.  S/W COLLETTE  @  [employer]  [employer]  # [phone]   1. TIKOSYN 250 MCG BID COVER- YES CO-PAY $ 192.99 TIER- NO PRIOR APPROVAL- YES # [phone]   2. DOFETILIDE  250 MCG BID COVER- YES CO-PAY- $ 100.00 TIER- NO PRIOR APPROVAL-NO  DEDUCTIBLE : NOT MET  PREFERRED PHARMACY : YES   [facility]   

## 2018-05-15 NOTE — Care Management Note (Signed)
Case Management Note  Patient Details  Name: Larry [name] MRN: [id number] Date of Birth: 03/06/1941  Subjective/Objective:   afib/aflutter                 Action/Plan: NCM spoke to pt and wife at bedside. States he was independent prior to hospital stay. States he has completed application with [employer] to receive assist with Tikosyn. He has application for Xarelto. States he is on a fixed income and meds are expensive. Explained he will receive Tikosyn 7 day supply prior to dc. Unit RN will send Rx to [place]. Will need a separate Tikosyn Rx for 7 day supply for [employer].    05-15-18 BENEFITS CHECK :  # 5.  S/W COLLETTE  @  [employer]  [employer]  # [phone]   1. TIKOSYN 250 MCG BID COVER- YES CO-PAY $ 192.99 TIER- NO PRIOR APPROVAL- YES # [phone]   2. DOFETILIDE  250 MCG BID COVER- YES CO-PAY- $ 100.00 TIER- NO PRIOR APPROVAL-NO  DEDUCTIBLE : NOT MET  PREFERRED PHARMACY : YES   [facility]     Expected Discharge Date:                  Expected Discharge Plan:  Home/Self Care  In-House Referral:  NA  Discharge planning Services  CM Consult, Medication Assistance  Post Acute Care Choice:  NA Choice offered to:  NA  DME Arranged:  N/A DME Agency:  NA  HH Arranged:  NA HH Agency:  NA  Status of Service:  Completed, signed off  If discussed at Long Length of Stay Meetings, dates discussed:    Additional Comments:  Erenest Rasher, RN 05/15/2018, 6:25 PM

## 2018-05-15 NOTE — Plan of Care (Signed)

## 2018-05-15 NOTE — Progress Notes (Addendum)
Post dose EKG is reviewed SR, 73bpm, manually measured QT 441ms, QTc 455ms Reviewed tracing with Dr. Rayann Heman  OK to proceed with Tikosyn  Tommye Standard, PA-C

## 2018-05-15 NOTE — Progress Notes (Signed)
Patient states that he will wear CPAP once he is ready for bed and stated that he does not need assistance with it. Instructed patient to inform staff if help is needed.

## 2018-05-15 NOTE — Progress Notes (Addendum)
Progress Note  Patient Name: Larry [name] Date of Encounter: 05/15/2018  Primary Cardiologist: Ena Dawley, MD   Subjective   Tolerating drug, no complaints, no symptoms  Inpatient Medications    Scheduled Meds: . dofetilide  250 mcg Oral BID  . metoprolol tartrate  75 mg Oral BID  . potassium chloride  20 mEq Oral BID  . pravastatin  40 mg Oral QPM  . rivaroxaban  20 mg Oral Q supper  . sodium chloride flush  3 mL Intravenous Q12H  . spironolactone  25 mg Oral Daily  . torsemide  60 mg Oral BID   Continuous Infusions: . sodium chloride     PRN Meds: sodium chloride, acetaminophen, nitroGLYCERIN, sodium chloride flush   Vital Signs    Vitals:   05/14/18 1700 05/14/18 2108 05/14/18 2126 05/15/18 0617  BP:  (!) 105/94  109/85  Pulse:   82 74  Resp:    20  Temp: 97.9 F (36.6 C)  (!) 97.4 F (36.3 C) 97.8 F (36.6 C)  TempSrc: Oral  Oral Oral  SpO2:   98% 97%  Weight:    182 lb 3.2 oz (82.6 kg)  Height:       No intake or output data in the 24 hours ending 05/15/18 1025 Filed Weights   05/14/18 1500 05/15/18 0617  Weight: 184 lb 11.2 oz (83.8 kg) 182 lb 3.2 oz (82.6 kg)    Telemetry    AFib >> SR - Personally Reviewed  ECG    Post dose EKG is AFib.flutter 93bpm, difficult to establish QT This AM SR 73bpm, manually measured QT 420-428ms, 463-410ms  - Personally Reviewed  Physical Exam   GEN: No acute distress.   Neck: No JVD Cardiac: RRR, no murmurs, rubs, or gallops.  Respiratory: CTA b/l. GI: Soft, nontender, non-distended  MS: No edema; No deformity. Neuro:  Nonfocal  Psych: Normal affect   Labs    Chemistry Recent Labs  Lab 05/10/18 1200 05/14/18 1026 05/15/18 0352  NA 134* 132* 131*  K 4.0 4.1 4.4  CL 94* 89* 90*  CO2 28 29 28   GLUCOSE 153* 211* 137*  BUN 18 23 22   CREATININE 1.45* 1.54* 1.43*  CALCIUM 9.6 9.2 9.0  GFRNONAA 46* 42* 46*  GFRAA 53* 49* 54*  ANIONGAP 12 14 13      HematologyNo results for input(s): WBC,  RBC, HGB, HCT, MCV, MCH, MCHC, RDW, PLT in the last 168 hours.  Cardiac EnzymesNo results for input(s): TROPONINI in the last 168 hours. No results for input(s): TROPIPOC in the last 168 hours.   BNPNo results for input(s): BNP, PROBNP in the last 168 hours.   DDimer No results for input(s): DDIMER in the last 168 hours.   Radiology    No results found.  Cardiac Studies   02/22/18; TTE Study Conclusions - Left ventricle: The cavity size was normal. Wall thickness was normal. Systolic function was at the lower limits of normal. The estimated ejection fraction was in the range of 50% to 55%. Severe hypokinesis of the basalinferolateral, inferior, and inferoseptal myocardium; consistent with infarction in the distribution of the right coronary artery. - Mitral valve: Calcified annulus. - Left atrium: The atrium was severely dilated. (85mm) - Right atrium: The atrium was severely dilated. - Pulmonary arteries: Systolic pressure was mildly increased. PA peak pressure: 33 mm Hg (S). - Pericardium, extracardiac: A small pericardial effusion was identified circumferential to the heart. There was no evidence of hemodynamic compromise.   Patient  Profile     77 y.o. male CAD, chronic diastolic CHF, HTN, and OSA w/CPAP and persistent AFib, here for tikosyn initiation  Assessment & Plan    1. Persistent Afib     Here for Tikosyn load     CHA2DS2Vasc is 3, on Xarelto (continue 20mg  dosing, follow Creat)     K+ 4.4     Mag 2.3     Creat 1.43 (Calc CrCl is 52)      QT looks OK  Financial assistance paperwork is pending in AFib clinic  2. CAD     No anginal complaints  3. Chronic CHF (diastolic)     No exam findings to suggest fluid OL  4. HTN     Continue home regime  5. OSA     Reports compliance with home CPAP, will order for here    For questions or updates, please contact [place] Please consult [email or web address] for contact info under  Cardiology/STEMI.      Signed, Baldwin Jamaica, PA-C  05/15/2018, 10:25 AM    I have seen, examined the patient, and reviewed the above assessment and plan.  Changes to above are made where necessary.  On exam, RRR.  He has converted to sinus rhythm Continue tikosyn.  Follow qt closely.  Co Sign: Thompson Grayer, MD 05/15/2018 2:46 PM

## 2018-05-15 NOTE — Progress Notes (Signed)
Spoke with [place], [employer] with EP. Ok to give tonight's dose of Tikosyn.

## 2018-05-16 ENCOUNTER — Encounter (HOSPITAL_COMMUNITY): Payer: Self-pay | Admitting: Certified Registered"

## 2018-05-16 ENCOUNTER — Encounter (HOSPITAL_COMMUNITY)
Admission: RE | Disposition: A | Discharge status: Home / Self Care | Payer: Self-pay | Source: Ambulatory Visit | Attending: Internal Medicine

## 2018-05-16 ENCOUNTER — Ambulatory Visit (HOSPITAL_COMMUNITY): Admit: 2018-05-16 | Payer: Medicare HMO | Admitting: Cardiology

## 2018-05-16 DIAGNOSIS — G4733 Obstructive sleep apnea (adult) (pediatric): Secondary | ICD-10-CM

## 2018-05-16 LAB — BASIC METABOLIC PANEL
ANION GAP: 12 (ref 5–15)
BUN: 23 mg/dL (ref 8–23)
CO2: 29 mmol/L (ref 22–32)
Calcium: 9 mg/dL (ref 8.9–10.3)
Chloride: 91 mmol/L — ABNORMAL LOW (ref 98–111)
Creatinine, Ser: 1.42 mg/dL — ABNORMAL HIGH (ref 0.61–1.24)
GFR calc Af Amer: 54 mL/min — ABNORMAL LOW (ref >60)
GFR calc non Af Amer: 47 mL/min — ABNORMAL LOW (ref >60)
GLUCOSE: 122 mg/dL — AB (ref 70–99)
POTASSIUM: 4.2 mmol/L (ref 3.5–5.1)
Sodium: 132 mmol/L — ABNORMAL LOW (ref 135–145)

## 2018-05-16 LAB — MAGNESIUM: Magnesium: 2.3 mg/dL (ref 1.7–2.4)

## 2018-05-16 SURGERY — CANCELLED PROCEDURE

## 2018-05-16 NOTE — Progress Notes (Signed)
Post dose EKG is reviewed with Dr. Curt Bears SR, 71bpm, manually measured QT is 484ms, QTc 493ms OK to continue with Tikosyn load.  Tommye Standard, PA-C

## 2018-05-16 NOTE — Plan of Care (Signed)
Tikosyn Protocol. Monitor EKG.

## 2018-05-16 NOTE — Progress Notes (Signed)
   Progress Note   Subjective   Doing well today, the patient denies CP or SOB.  No new concerns  Inpatient Medications    Scheduled Meds: . dofetilide  250 mcg Oral BID  . metoprolol tartrate  75 mg Oral BID  . potassium chloride  20 mEq Oral BID  . pravastatin  40 mg Oral QPM  . rivaroxaban  20 mg Oral Q1400  . sodium chloride flush  3 mL Intravenous Q12H  . spironolactone  25 mg Oral Daily  . torsemide  60 mg Oral BID   Continuous Infusions: . sodium chloride     PRN Meds: sodium chloride, acetaminophen, nitroGLYCERIN, sodium chloride flush   Vital Signs    Vitals:   05/15/18 0617 05/15/18 1451 05/15/18 2044 05/16/18 0506  BP: 109/85 108/86 116/84 101/76  Pulse: 74 73 77 68  Resp: 20  16 15   Temp: 97.8 F (36.6 C) 98.4 F (36.9 C) 98.3 F (36.8 C) (!) 97.4 F (36.3 C)  TempSrc: Oral Oral Oral Oral  SpO2: 97% 95% 94% 97%  Weight: 82.6 kg   82.6 kg  Height:        Intake/Output Summary (Last 24 hours) at 05/16/2018 0819 Last data filed at 05/15/2018 2118 Gross per 24 hour  Intake 483 ml  Output -  Net 483 ml   Filed Weights   05/14/18 1500 05/15/18 0617 05/16/18 0506  Weight: 83.8 kg 82.6 kg 82.6 kg    Telemetry    Sinus rhythm, no arrhythmias - Personally Reviewed  Physical Exam   GEN- The patient is well appearing, alert and oriented x 3 today.   Head- normocephalic, atraumatic Ears- hearing intact Oropharynx- clear Neck- supple Lungs-   normal work of breathing Heart- Regular rate and rhythm  GI- soft  Extremities- no clubbing, cyanosis, or edema  MS- no significant deformity or atrophy Skin- no rash or lesion Psych- euthymic mood, full affect Neuro- strength and sensation are intact   Labs    Chemistry Recent Labs  Lab 05/14/18 1026 05/15/18 0352 05/16/18 0345  NA 132* 131* 132*  K 4.1 4.4 4.2  CL 89* 90* 91*  CO2 29 28 29   GLUCOSE 211* 137* 122*  BUN 23 22 23   CREATININE 1.54* 1.43* 1.42*  CALCIUM 9.2 9.0 9.0  GFRNONAA 42*  46* 47*  GFRAA 49* 54* 54*  ANIONGAP 14 13 12      HematologyNo results for input(s): WBC, RBC, HGB, HCT, MCV, MCH, MCHC, RDW, PLT in the last 168 hours.  Cardiac EnzymesNo results for input(s): TROPONINI in the last 168 hours. No results for input(s): TROPIPOC in the last 168 hours.      Assessment & Plan    1.  Persistent atrial fibrillation Maintaining sinus rhythm Qtc is stable 486 No changes today  2. HTN Stable No change required today  3. CRI, stage III Stable No change required today  4. Chronic diastolic dysfunction euvolemic  5. OSA Compliance with CPAP encouraged  Anticipate discharge to home tomorrow  Will need close follow-up in AF clinic  Thompson Grayer MD, [facility] 05/16/2018 8:19 AM

## 2018-05-17 DIAGNOSIS — Z79899 Other long term (current) drug therapy: Secondary | ICD-10-CM

## 2018-05-17 DIAGNOSIS — Z5181 Encounter for therapeutic drug level monitoring: Secondary | ICD-10-CM

## 2018-05-17 LAB — BASIC METABOLIC PANEL
Anion gap: 12 (ref 5–15)
BUN: 24 mg/dL — ABNORMAL HIGH (ref 8–23)
CHLORIDE: 90 mmol/L — AB (ref 98–111)
CO2: 31 mmol/L (ref 22–32)
Calcium: 9.1 mg/dL (ref 8.9–10.3)
Creatinine, Ser: 1.33 mg/dL — ABNORMAL HIGH (ref 0.61–1.24)
GFR calc non Af Amer: 51 mL/min — ABNORMAL LOW (ref >60)
GFR, EST AFRICAN AMERICAN: 59 mL/min — AB (ref >60)
Glucose, Bld: 108 mg/dL — ABNORMAL HIGH (ref 70–99)
POTASSIUM: 4 mmol/L (ref 3.5–5.1)
SODIUM: 133 mmol/L — AB (ref 135–145)

## 2018-05-17 LAB — MAGNESIUM: Magnesium: 2.2 mg/dL (ref 1.7–2.4)

## 2018-05-17 MED ORDER — DOFETILIDE 250 MCG PO CAPS: 250.0000 ug | ORAL_CAPSULE | Freq: Two times a day (BID) | ORAL | 6 refills | Status: DC

## 2018-05-17 MED ORDER — DOFETILIDE 250 MCG PO CAPS
250.0000 ug | ORAL_CAPSULE | Freq: Two times a day (BID) | ORAL | Status: DC
  Filled 2018-05-17: qty 14

## 2018-05-17 NOTE — Care Management Important Message (Signed)
Important Message  Patient Details  Name: Larry [name] MRN: [id number] Date of Birth: 04/01/1941   Medicare Important Message Given:  Yes    Chiffon Kittleson P Raymie Giammarco 05/17/2018, 3:11 PM

## 2018-05-17 NOTE — Progress Notes (Signed)
Tikosyn 7 day supply given to pt.

## 2018-05-17 NOTE — Progress Notes (Signed)
Post dose EKG and telemetry reviewed with Dr. Curt Bears OK for Tikosyn this morning Anticipate discharge this afternoon after final loading dose/EKG completed  Larry Standard, PA-C

## 2018-05-17 NOTE — Progress Notes (Signed)
Pt will place self on NIV once ready for bed.

## 2018-05-17 NOTE — Progress Notes (Signed)
Discharge instructions reviewed with pt. Pt denies any pain and states he is ready for discharge. IV d/c. Pt has no questions at this time.

## 2018-05-17 NOTE — Discharge Instructions (Signed)

## 2018-05-17 NOTE — Discharge Summary (Addendum)
ELECTROPHYSIOLOGY PROCEDURE DISCHARGE SUMMARY    Patient ID: Larry [name],  MRN: [id number], DOB/AGE: 02-11-1941 77 y.o.  Admit date: 05/14/2018 Discharge date: 05/17/2018  Primary Care Physician: Patient, No Pcp Per Primary Cardiologist: Dr. Johnsie Cancel AHF: Dr. Aundra Dubin Electrophysiologist: new this admission, Dr. Rayann Heman  Primary Discharge Diagnosis:  1.  Persistent atrial fibrillation status post Tikosyn loading this admission     CHA2DS2Vasc is 3, on Xarelto, appropriately dosed  Secondary Discharge Diagnosis:  1. CAD 2. Chronic CHF (diastolic)     Currently compensated 4. HTN 4. OSA w/CPAP  Allergies  Allergen Reactions  . Plavix [Clopidogrel Bisulfate] Anaphylaxis          Procedures This Admission:  1.  Tikosyn loading   Brief HPI: Larry [name] is a 77 y.o. male with a past medical history as noted above.  The patient was referred to the AFib clinic by Dr. Aundra Dubin for arrangement to initiate Tikosyn for his atrial fibrillation.  Risks, benefits, and alternatives to Tikosyn were reviewed with the patient who wished to proceed.    Hospital Course:  The patient was admitted and Tikosyn was initiated.  Renal function and electrolytes were followed during the hospitalization.  His QTc remained stable, noting a prominent U wave, his final post dose EKG on day of discharge is reviewed with Dr. Curt Bears and felt stable.  The patient converted to SR with drug and did not require DCCV.  He was monitored until discharge on telemetry which demonstrated SR.  On the day of discharge,he was examined by Dr Curt Bears who considered them stable for discharge to home.  Follow-up has been arranged with the AFib clinic in 1 week and with Dr Rayann Heman in 4 weeks.   Physical Exam: Vitals:   05/16/18 1417 05/16/18 2108 05/17/18 0653 05/17/18 0800  BP: 106/80 115/89 (!) 86/62 115/78  Pulse: 70 77 73   Resp: 18  16   Temp:  (!) 97.5 F (36.4 C) 97.7 F (36.5 C)   TempSrc: Oral Oral Oral     SpO2:  96% 96%   Weight:   82 kg   Height:        GEN- The patient is well appearing, alert and oriented x 3 today.   HEENT: normocephalic, atraumatic; sclera clear, conjunctiva pink; hearing intact; oropharynx clear; neck supple, no JVP Lymph- no cervical lymphadenopathy Lungs- CTA b/l, normal work of breathing.  No wheezes, rales, rhonchi Heart- RRR, no murmurs, rubs or gallops GI- soft, non-tender, non-distended Extremities- no clubbing, cyanosis, or edema; DP/PT/radial pulses 2+ bilaterally MS- no significant deformity or atrophy Skin- warm and dry, no rash or lesion Psych- euthymic mood, full affect Neuro- strength and sensation are intact   Labs:   Lab Results  Component Value Date   WBC 8.6 04/29/2018   HGB 16.3 04/29/2018   HCT 52.4 (H) 04/29/2018   MCV 85.9 04/29/2018   PLT 200 04/29/2018    Recent Labs  Lab 05/17/18 0421  NA 133*  K 4.0  CL 90*  CO2 31  BUN 24*  CREATININE 1.33*  CALCIUM 9.1  GLUCOSE 108*     Discharge Medications:  Allergies as of 05/17/2018      Reactions   Plavix [clopidogrel Bisulfate] Anaphylaxis         Medication List    TAKE these medications   acetaminophen 500 MG tablet Commonly known as:  TYLENOL Take 500 mg by mouth every 6 (six) hours as needed.   dofetilide 250 MCG capsule  Commonly known as:  TIKOSYN Take 1 capsule (250 mcg total) by mouth 2 (two) times daily.   metoprolol tartrate 50 MG tablet Commonly known as:  LOPRESSOR Take 75 mg by mouth 2 (two) times daily.   nitroGLYCERIN 0.4 MG SL tablet Commonly known as:  NITROSTAT Place 1 tablet (0.4 mg total) under the tongue every 5 (five) minutes as needed for chest pain (3 doses max).   potassium chloride 10 MEQ tablet Commonly known as:  K-DUR,KLOR-CON Take 2 tablets (20 mEq total) by mouth 2 (two) times daily.   pravastatin 40 MG tablet Commonly known as:  PRAVACHOL Take 1 tablet (40 mg total) by mouth every evening.   spironolactone 25 MG  tablet Commonly known as:  ALDACTONE Take 1 tablet (25 mg total) by mouth daily.   torsemide 20 MG tablet Commonly known as:  DEMADEX Take 3 tablets (60 mg total) by mouth 2 (two) times daily.   XARELTO 20 MG Tabs tablet Generic drug:  rivaroxaban TAKE 1 TABLET (20 MG TOTAL) BY MOUTH DAILY WITH SUPPER       Disposition: Home Discharge Instructions    Diet - low sodium heart healthy   Complete by:  As directed    Increase activity slowly   Complete by:  As directed      Follow-up Information    [facility] Follow up on 05/24/2018.   Specialty:  Cardiology Why:  11:30AM Contact information: [address] [id number] [place] [phone]       Thompson Grayer, MD Follow up on 06/17/2018.   Specialty:  Cardiology Why:  10:30AM Contact information: [place] [address] [phone]           Duration of Discharge Encounter: Greater than 30 minutes including physician time.  Signed, Tommye Standard, PA-C 05/17/2018 1:28 PM    I have seen and examined this patient with Tommye Standard.  Agree with above, note added to reflect my findings.  On exam, RRR, no murmurs, lungs clear.  Admitted for Tikosyn loading.  Is in sinus rhythm today at discharge.  We [place] plan for discharge with follow-up in A. fib clinic.  Of note his T wave is very flat and is very difficult to interpret.  Keyundra Fant M. Zakayla Martinec MD 05/17/2018 1:51 PM

## 2018-05-24 ENCOUNTER — Encounter (HOSPITAL_COMMUNITY): Payer: Self-pay | Admitting: Nurse Practitioner

## 2018-05-24 ENCOUNTER — Ambulatory Visit (HOSPITAL_COMMUNITY)
Admit: 2018-05-24 | Discharge: 2018-05-24 | Disposition: A | Discharge status: Home / Self Care | Payer: Medicare HMO | Source: Ambulatory Visit | Attending: Nurse Practitioner | Admitting: Nurse Practitioner

## 2018-05-24 VITALS — BP 98/64 | HR 73 | Ht 72.0 in | Wt 179.0 lb

## 2018-05-24 DIAGNOSIS — E7800 Pure hypercholesterolemia, unspecified: Secondary | ICD-10-CM | POA: Insufficient documentation

## 2018-05-24 DIAGNOSIS — I4892 Unspecified atrial flutter: Secondary | ICD-10-CM | POA: Insufficient documentation

## 2018-05-24 DIAGNOSIS — Z7901 Long term (current) use of anticoagulants: Secondary | ICD-10-CM | POA: Insufficient documentation

## 2018-05-24 DIAGNOSIS — I10 Essential (primary) hypertension: Secondary | ICD-10-CM | POA: Insufficient documentation

## 2018-05-24 DIAGNOSIS — I481 Persistent atrial fibrillation: Secondary | ICD-10-CM | POA: Diagnosis not present

## 2018-05-24 DIAGNOSIS — Z87891 Personal history of nicotine dependence: Secondary | ICD-10-CM | POA: Insufficient documentation

## 2018-05-24 DIAGNOSIS — Z888 Allergy status to other drugs, medicaments and biological substances status: Secondary | ICD-10-CM | POA: Insufficient documentation

## 2018-05-24 DIAGNOSIS — I4819 Other persistent atrial fibrillation: Secondary | ICD-10-CM

## 2018-05-24 DIAGNOSIS — Z79899 Other long term (current) drug therapy: Secondary | ICD-10-CM | POA: Insufficient documentation

## 2018-05-24 DIAGNOSIS — I252 Old myocardial infarction: Secondary | ICD-10-CM | POA: Diagnosis not present

## 2018-05-24 DIAGNOSIS — I2510 Atherosclerotic heart disease of native coronary artery without angina pectoris: Secondary | ICD-10-CM | POA: Insufficient documentation

## 2018-05-24 DIAGNOSIS — Z8249 Family history of ischemic heart disease and other diseases of the circulatory system: Secondary | ICD-10-CM | POA: Insufficient documentation

## 2018-05-24 DIAGNOSIS — I4891 Unspecified atrial fibrillation: Secondary | ICD-10-CM | POA: Diagnosis not present

## 2018-05-24 DIAGNOSIS — Z955 Presence of coronary angioplasty implant and graft: Secondary | ICD-10-CM | POA: Diagnosis not present

## 2018-05-24 LAB — BASIC METABOLIC PANEL
Anion gap: 13 (ref 5–15)
BUN: 19 mg/dL (ref 8–23)
CO2: 28 mmol/L (ref 22–32)
Calcium: 9.3 mg/dL (ref 8.9–10.3)
Chloride: 93 mmol/L — ABNORMAL LOW (ref 98–111)
Creatinine, Ser: 1.26 mg/dL — ABNORMAL HIGH (ref 0.61–1.24)
GFR calc Af Amer: >60 mL/min (ref >60)
GFR calc non Af Amer: 54 mL/min — ABNORMAL LOW (ref >60)
Glucose, Bld: 173 mg/dL — ABNORMAL HIGH (ref 70–99)
Potassium: 3.9 mmol/L (ref 3.5–5.1)
Sodium: 134 mmol/L — ABNORMAL LOW (ref 135–145)

## 2018-05-24 LAB — MAGNESIUM: MAGNESIUM: 2.4 mg/dL (ref 1.7–2.4)

## 2018-05-24 NOTE — Progress Notes (Signed)
Primary Care Physician: Patient, No Pcp Per Referring Physician: Dr. Johnsie Cancel Cardiologist: Dr. Kriste Basque Carreras is a 77 y.o. male with a h/o hyperlipidemia, CAD, multiple stents of the RCA  that presented to his routine office visit with Dr. Johnsie Cancel, 12/01/16 and asymptomatic flutter was found on EKG.He was clear that he was  asymptomatic and did not want to purse cardioversion, He was  started on xarelto 20 mg for a chadsvasc score of at least 3.  He continued to be asymptomatic until the last few months and was referred to Dr. Aundra Dubin by Dr. Johnsie Cancel 05/10/18.  For CHF. He was admitted this past June for diuresis. Dr. Aundra Dubin thought his recent symptoms could be 2/2 chronic afib and wanted him to try Tikosyn to see if SR could be restored and if his symptoms would improve.  He is in the afib clinic for [facility] for [place]. No benadryl use and no missed does of anticoagulation. Pt does fit guidelines for Tikosyn assistance and his forms have been filled out.  F/u 8/16 for loading of tikosyn. He converted with one dose of the drug and continues in SR. His qtc on EKG machine  read as prolonged. Qtc 713 ms on d/c from [name] with hand written qtc corrected to 440/458 ms  by EP. Dr. Curt Bears reviewed EKG today and felt the qt was corrected to 440 ms. He feel much better in SR, does not feel his is having as much swelling and is wanting to reduce his torsemide as his BP's at home are soft, today at 98/64.  Today, he denies symptoms of palpitations, chest pain, shortness of breath, orthopnea, PND, lower extremity edema, dizziness, presyncope, syncope, or neurologic sequela. The patient is tolerating medications without difficulties and is otherwise without complaint today.   Past Medical History:  Diagnosis Date  . Coronary artery disease    status post remote PCI of the mid to distal RCA 11/1998 followed by acute ST elevation MI inferiorly 08/1999 with occlusion of the prior RCA stent as well as 70%  diagonal branch.  He underwent PCI of the RCA  . Hypercholesterolemia   . Hypertension   . Myocardial infarct ([place])   . Olecranon bursitis    Past Surgical History:  Procedure Laterality Date  . CARDIAC CATHETERIZATION  12/15/1999   EF was 55%   . CORONARY ANGIOPLASTY WITH STENT PLACEMENT  08/15/1999   CAD, status post prior stenting of the mid to distal RCA in 11/1998, with an acute diaphragmatic wall infarction due to total occlusion at the stent site/There is also 70% narrowing in the diagonal branch of the LAD/The LV showed inferior wall hypo- akinesis.-- Successful reperfusion, percutaneous transluminal coronary percutaneous transluminal coronary & placement of a 2nd overlying stent in RCA    Current Outpatient Medications  Medication Sig Dispense Refill  . acetaminophen (TYLENOL) 500 MG tablet Take 500 mg by mouth every 6 (six) hours as needed.    . dofetilide (TIKOSYN) 250 MCG capsule Take 1 capsule (250 mcg total) by mouth 2 (two) times daily. 60 capsule 6  . metoprolol tartrate (LOPRESSOR) 50 MG tablet Take 75 mg by mouth 2 (two) times daily.    . nitroGLYCERIN (NITROSTAT) 0.4 MG SL tablet Place 1 tablet (0.4 mg total) under the tongue every 5 (five) minutes as needed for chest pain (3 doses max). 25 tablet 3  . potassium chloride (K-DUR,KLOR-CON) 10 MEQ tablet Take 2 tablets (20 mEq total) by mouth 2 (two) times daily. [place]  tablet 11  . pravastatin (PRAVACHOL) 40 MG tablet Take 1 tablet (40 mg total) by mouth every evening. 90 tablet 2  . spironolactone (ALDACTONE) 25 MG tablet Take 1 tablet (25 mg total) by mouth daily. 30 tablet 3  . torsemide (DEMADEX) 20 MG tablet Take 3 tablets (60 mg total) by mouth 2 (two) times daily. 180 tablet 11  . XARELTO 20 MG TABS tablet TAKE 1 TABLET (20 MG TOTAL) BY MOUTH DAILY WITH SUPPER 30 tablet 6   No current facility-administered medications for this encounter.     Allergies  Allergen Reactions  . Plavix [Clopidogrel Bisulfate]  Anaphylaxis         Social History   Socioeconomic History  . Marital status: Married    Spouse name: scarlett  . Number of children: 3  . Years of education: 44  . Highest education level: Not on file  Occupational History  . Occupation: retired  Scientific laboratory technician  . Financial resource strain: Not on file  . Food insecurity:    Worry: Not on file    Inability: Not on file  . Transportation needs:    Medical: Not on file    Non-medical: Not on file  Tobacco Use  . Smoking status: Former Smoker    Types: Cigarettes    Last attempt to quit: 11/02/1999    Years since quitting: 18.5  . Smokeless tobacco: Never Used  Substance and Sexual Activity  . Alcohol use: No  . Drug use: No  . Sexual activity: Not on file  Lifestyle  . Physical activity:    Days per week: Not on file    Minutes per session: Not on file  . Stress: Not on file  Relationships  . Social connections:    Talks on phone: Not on file    Gets together: Not on file    Attends religious service: Not on file    Active member of club or organization: Not on file    Attends meetings of clubs or organizations: Not on file    Relationship status: Not on file  . Intimate partner violence:    Fear of current or ex partner: Not on file    Emotionally abused: Not on file    Physically abused: Not on file    Forced sexual activity: Not on file  Other Topics Concern  . Not on file  Social History Narrative  . Not on file    Family History  Problem Relation Age of Onset  . Heart Problems Mother   . Cancer Father     ROS- All systems are reviewed and negative except as per the HPI above  Physical Exam: Vitals:   05/24/18 1142  BP: 98/64  Pulse: 73  Weight: 81.2 kg  Height: 6' (1.829 m)   Wt Readings from Last 3 Encounters:  05/24/18 81.2 kg  05/17/18 82 kg  05/14/18 82.6 kg    Labs: Lab Results  Component Value Date   NA 134 (L) 05/24/2018   K 3.9 05/24/2018   CL 93 (L) 05/24/2018   CO2 28  05/24/2018   GLUCOSE 173 (H) 05/24/2018   BUN 19 05/24/2018   CREATININE 1.26 (H) 05/24/2018   CALCIUM 9.3 05/24/2018   MG 2.4 05/24/2018   No results found for: INR Lab Results  Component Value Date   CHOL 101 05/10/2018   HDL 35 (L) 05/10/2018   LDLCALC 54 05/10/2018   TRIG 58 05/10/2018     GEN- The patient  is well appearing, alert and oriented x 3 today.   Head- normocephalic, atraumatic Eyes-  Sclera clear, conjunctiva pink Ears- hearing intact Oropharynx- clear Neck- supple, no JVP Lymph- no cervical lymphadenopathy Lungs- Clear to ausculation bilaterally, normal work of breathing Heart- regular rate and rhythm, no murmurs, rubs or gallops, PMI not laterally displaced GI- soft, NT, ND, + BS Extremities- no clubbing, cyanosis, or edema MS- no significant deformity or atrophy Skin- no rash or lesion Psych- euthymic mood, full affect Neuro- strength and sensation are intact  EKG- SR with first degree av block at 73 bpm, pr int 222 ms, qrs int 86 ms, qtc 528 ms, reviewed by Dr. Curt Bears and he feels  corrects to 440 ms. Epic records reviewed Echo- 2019-- Left ventricle: The cavity size was normal. Wall thickness was   normal. Systolic function was at the lower limits of normal. The   estimated ejection fraction was in the range of 50% to 55%.   Severe hypokinesis of the basalinferolateral, inferior, and   inferoseptal myocardium; consistent with infarction in the   distribution of the right coronary artery. - Mitral valve: Calcified annulus. - Left atrium: The atrium was severely dilated. - Right atrium: The atrium was severely dilated. - Pulmonary arteries: Systolic pressure was mildly increased. PA   peak pressure: 33 mm Hg (S). - Pericardium, extracardiac: A small pericardial effusion was   identified circumferential to the heart. There was no evidence of   hemodynamic compromise.    Assessment and Plan: 1. Afib/flutter Pt has been in persistently since late  2017, early 2018. Was asymptomatic until the last few months, then fatigue and shortness of breath and hospitalization for diureses in June Pt initially resisted any treatment for afib, since he was asymptomatic Dr. Aundra Dubin evaluated and recommended Tikosyn which pt had successful loading last week Continue 250 mcg bid Reminded not to miss doses or combine with other qtc prolonging  drugs Continue xarelto 20 mg daily with a CHA2DS2VASc score of at least 3 Reminded no benadryl use Have met guidelines for Tikosyn assistance and have turned in their forms  Bmet/mag today  2. Soft BP's Pt feels he is not retaining  fluid as much now in SR and is noticing BP's to be running lower Discussed with Dr. Aundra Dubin and he can reduce torsemide to 40 mg bid, but if he has weight gain or increase in LLE , he is to go back to 60 mg bid He is very careful to check weight daily  F/u with Dr. Rayann Heman 9/9  Larry [name], Larry [name] [address] [place], [place] [address] [phone]

## 2018-06-17 ENCOUNTER — Ambulatory Visit: Payer: Medicare HMO | Admitting: Internal Medicine

## 2018-06-17 ENCOUNTER — Encounter: Payer: Self-pay | Admitting: Internal Medicine

## 2018-06-17 VITALS — BP 112/74 | HR 73 | Ht 72.0 in | Wt 180.8 lb

## 2018-06-17 DIAGNOSIS — I5032 Chronic diastolic (congestive) heart failure: Secondary | ICD-10-CM

## 2018-06-17 DIAGNOSIS — I10 Essential (primary) hypertension: Secondary | ICD-10-CM

## 2018-06-17 DIAGNOSIS — I4819 Other persistent atrial fibrillation: Secondary | ICD-10-CM

## 2018-06-17 DIAGNOSIS — I481 Persistent atrial fibrillation: Secondary | ICD-10-CM | POA: Diagnosis not present

## 2018-06-17 NOTE — Patient Instructions (Addendum)
Medication Instructions:  Your physician recommends that you continue on your current medications as directed. Please refer to the Current Medication list given to you today.  Labwork: None ordered.  Testing/Procedures: None ordered.  Follow-Up: Your physician wants you to follow-up in: 3 months with Roderic Palau, NP at the [facility] clinic.  Any Other Special Instructions Will Be Listed Below (If Applicable).  If you need a refill on your cardiac medications before your next appointment, please call your pharmacy.

## 2018-06-17 NOTE — Progress Notes (Signed)
Electrophysiology Office Note   Date:  06/17/2018   ID:  Larry [name], DOB [birth year], MRN [id number]  PCP:  Patient, No Pcp Per  Cardiologist:  Cherly Hensen Primary Electrophysiologist: Thompson Grayer, MD    CC: afib   History of Present Illness: Larry [name] is a 77 y.o. male who presents today for electrophysiology evaluation.   He presents for AF follow-up.  He was recently admitted for tikosyn loading.  Doing better at this time.  Today, he denies symptoms of palpitations, chest pain, shortness of breath, orthopnea, PND, lower extremity edema, claudication, dizziness, presyncope, syncope, bleeding, or neurologic sequela. The patient is tolerating medications without difficulties and is otherwise without complaint today.    Past Medical History:  Diagnosis Date  . Coronary artery disease    status post remote PCI of the mid to distal RCA 11/1998 followed by acute ST elevation MI inferiorly 08/1999 with occlusion of the prior RCA stent as well as 70% diagonal branch.  He underwent PCI of the RCA  . Hypercholesterolemia   . Hypertension   . Myocardial infarct ([place])   . Olecranon bursitis    Past Surgical History:  Procedure Laterality Date  . CARDIAC CATHETERIZATION  12/15/1999   EF was 55%   . CORONARY ANGIOPLASTY WITH STENT PLACEMENT  08/15/1999   CAD, status post prior stenting of the mid to distal RCA in 11/1998, with an acute diaphragmatic wall infarction due to total occlusion at the stent site/There is also 70% narrowing in the diagonal branch of the LAD/The LV showed inferior wall hypo- akinesis.-- Successful reperfusion, percutaneous transluminal coronary percutaneous transluminal coronary & placement of a 2nd overlying stent in RCA     Current Outpatient Medications  Medication Sig Dispense Refill  . acetaminophen (TYLENOL) 500 MG tablet Take 500 mg by mouth every 6 (six) hours as needed.    . dofetilide (TIKOSYN) 250 MCG capsule Take 1 capsule (250 mcg total) by mouth  2 (two) times daily. 60 capsule 6  . metoprolol tartrate (LOPRESSOR) 50 MG tablet Take 75 mg by mouth 2 (two) times daily.    . nitroGLYCERIN (NITROSTAT) 0.4 MG SL tablet Place 1 tablet (0.4 mg total) under the tongue every 5 (five) minutes as needed for chest pain (3 doses max). 25 tablet 3  . potassium chloride (K-DUR,KLOR-CON) 10 MEQ tablet Take 2 tablets (20 mEq total) by mouth 2 (two) times daily. 120 tablet 11  . pravastatin (PRAVACHOL) 40 MG tablet Take 1 tablet (40 mg total) by mouth every evening. 90 tablet 2  . spironolactone (ALDACTONE) 25 MG tablet Take 1 tablet (25 mg total) by mouth daily. 30 tablet 3  . torsemide (DEMADEX) 20 MG tablet Take 3 tablets (60 mg total) by mouth 2 (two) times daily. 180 tablet 11  . XARELTO 20 MG TABS tablet TAKE 1 TABLET (20 MG TOTAL) BY MOUTH DAILY WITH SUPPER 30 tablet 6   No current facility-administered medications for this visit.     Allergies:   Plavix [clopidogrel bisulfate]   Social History:  The patient  reports that he quit smoking about 18 years ago. His smoking use included cigarettes. He has never used smokeless tobacco. He reports that he does not drink alcohol or use drugs.   Family History:  The patient's  family history includes Cancer in his father; Heart Problems in his mother.    ROS:  Please see the history of present illness.   All other systems are personally reviewed and negative.  PHYSICAL EXAM: VS:  BP 112/74   Pulse 73   Ht 6' (1.829 m)   Wt 180 lb 12.8 oz (82 kg)   SpO2 96%   BMI 24.52 kg/m  , BMI Body mass index is 24.52 kg/m. GEN: Well nourished, well developed, in no acute distress  HEENT: normal  Neck: no JVD, carotid bruits, or masses Cardiac: RRR; no murmurs, rubs, or gallops,no edema  Respiratory:  clear to auscultation bilaterally, normal work of breathing GI: soft, nontender, nondistended, + BS MS: no deformity or atrophy  Skin: warm and dry  Neuro:  Strength and sensation are intact Psych:  euthymic mood, full affect  EKG:  EKG is ordered today. The ekg ordered today is personally reviewed and shows sinus rhythm 73 bpm, PR 228 msec, QRS 96 msec, Qtc 496 msec   Recent Labs: 03/22/2018: ALT 39; B Natriuretic Peptide 468.2 04/09/2018: NT-Pro BNP 3,260 04/29/2018: Hemoglobin 16.3; Platelets 200 05/24/2018: BUN 19; Creatinine, Ser 1.26; Magnesium 2.4; Potassium 3.9; Sodium 134  personally reviewed   Lipid Panel     Component Value Date/Time   CHOL 101 05/10/2018 1200   TRIG 58 05/10/2018 1200   HDL 35 (L) 05/10/2018 1200   CHOLHDL 2.9 05/10/2018 1200   VLDL 12 05/10/2018 1200   LDLCALC 54 05/10/2018 1200   LDLDIRECT 144.3 08/15/2013 1303   personally reviewed   Wt Readings from Last 3 Encounters:  06/17/18 180 lb 12.8 oz (82 kg)  05/24/18 179 lb (81.2 kg)  05/17/18 180 lb 12.8 oz (82 kg)      Other studies personally reviewed: Additional studies/ records that were reviewed today include: recent hospital records, AF clinic notes  Review of the above records today demonstrates: as above   ASSESSMENT AND PLAN:  1.  Persistent afib/ atrial flutter Maintaining sinus rhythm with tikosyn Recent labs reviewed Continue xarelto for chads2vasc score of 3.  Follow-up:  AF clinic every 3 months and with Dr Aundra Dubin I will see when needed  Current medicines are reviewed at length with the patient today.   The patient does not have concerns regarding his medicines.  The following changes were made today:  none   Signed, Thompson Grayer, MD  06/17/2018 10:57 AM     [facility] [address] [place] [address] [phone] (office) [phone] (fax)

## 2018-06-28 ENCOUNTER — Ambulatory Visit (HOSPITAL_COMMUNITY)
Admission: RE | Admit: 2018-06-28 | Discharge: 2018-06-28 | Disposition: A | Discharge status: Home / Self Care | Payer: Medicare HMO | Source: Ambulatory Visit | Attending: Cardiology | Admitting: Cardiology

## 2018-06-28 VITALS — BP 128/70 | HR 72 | Wt 181.6 lb

## 2018-06-28 DIAGNOSIS — Z7901 Long term (current) use of anticoagulants: Secondary | ICD-10-CM | POA: Insufficient documentation

## 2018-06-28 DIAGNOSIS — I5022 Chronic systolic (congestive) heart failure: Secondary | ICD-10-CM

## 2018-06-28 DIAGNOSIS — R9431 Abnormal electrocardiogram [ECG] [EKG]: Secondary | ICD-10-CM | POA: Insufficient documentation

## 2018-06-28 DIAGNOSIS — I2510 Atherosclerotic heart disease of native coronary artery without angina pectoris: Secondary | ICD-10-CM | POA: Diagnosis not present

## 2018-06-28 DIAGNOSIS — I481 Persistent atrial fibrillation: Secondary | ICD-10-CM

## 2018-06-28 DIAGNOSIS — Z955 Presence of coronary angioplasty implant and graft: Secondary | ICD-10-CM | POA: Diagnosis not present

## 2018-06-28 DIAGNOSIS — I252 Old myocardial infarction: Secondary | ICD-10-CM | POA: Insufficient documentation

## 2018-06-28 DIAGNOSIS — I110 Hypertensive heart disease with heart failure: Secondary | ICD-10-CM | POA: Insufficient documentation

## 2018-06-28 DIAGNOSIS — I509 Heart failure, unspecified: Secondary | ICD-10-CM

## 2018-06-28 DIAGNOSIS — I5032 Chronic diastolic (congestive) heart failure: Secondary | ICD-10-CM | POA: Diagnosis not present

## 2018-06-28 DIAGNOSIS — I4819 Other persistent atrial fibrillation: Secondary | ICD-10-CM

## 2018-06-28 DIAGNOSIS — Z79899 Other long term (current) drug therapy: Secondary | ICD-10-CM | POA: Insufficient documentation

## 2018-06-28 DIAGNOSIS — Z87891 Personal history of nicotine dependence: Secondary | ICD-10-CM | POA: Diagnosis not present

## 2018-06-28 DIAGNOSIS — G4733 Obstructive sleep apnea (adult) (pediatric): Secondary | ICD-10-CM | POA: Diagnosis not present

## 2018-06-28 DIAGNOSIS — I4891 Unspecified atrial fibrillation: Secondary | ICD-10-CM | POA: Insufficient documentation

## 2018-06-28 DIAGNOSIS — I440 Atrioventricular block, first degree: Secondary | ICD-10-CM | POA: Diagnosis not present

## 2018-06-28 LAB — BASIC METABOLIC PANEL
Anion gap: 12 (ref 5–15)
BUN: 18 mg/dL (ref 8–23)
CALCIUM: 9.2 mg/dL (ref 8.9–10.3)
CHLORIDE: 95 mmol/L — AB (ref 98–111)
CO2: 26 mmol/L (ref 22–32)
CREATININE: 1.32 mg/dL — AB (ref 0.61–1.24)
GFR calc Af Amer: 59 mL/min — ABNORMAL LOW (ref >60)
GFR calc non Af Amer: 51 mL/min — ABNORMAL LOW (ref >60)
Glucose, Bld: 101 mg/dL — ABNORMAL HIGH (ref 70–99)
Potassium: 4.2 mmol/L (ref 3.5–5.1)
Sodium: 133 mmol/L — ABNORMAL LOW (ref 135–145)

## 2018-06-28 LAB — MAGNESIUM: Magnesium: 2.3 mg/dL (ref 1.7–2.4)

## 2018-06-28 LAB — CBC
HEMATOCRIT: 44.8 % (ref 39.0–52.0)
Hemoglobin: 13.9 g/dL (ref 13.0–17.0)
MCH: 29.1 pg (ref 26.0–34.0)
MCHC: 31 g/dL (ref 30.0–36.0)
MCV: 93.9 fL (ref 78.0–100.0)
Platelets: 217 10*3/uL (ref 150–400)
RBC: 4.77 MIL/uL (ref 4.22–5.81)
RDW: 23.3 % — AB (ref 11.5–15.5)
WBC: 10 10*3/uL (ref 4.0–10.5)

## 2018-06-28 MED ORDER — TORSEMIDE 20 MG PO TABS: 40.0000 mg | ORAL_TABLET | Freq: Two times a day (BID) | ORAL | 11 refills | Status: DC

## 2018-06-28 NOTE — Patient Instructions (Signed)
Take Torsemide 40 mg Twice daily   Labs today  We will contact you in 6 months to schedule your next appointment.

## 2018-06-28 NOTE — Progress Notes (Signed)
Medication Samples have been provided to the patient.  Drug name: Xarelto       Strength: 20mg         Qty: 2 LOT: [id number]  Exp.Date: 6/21  Dosing instructions: take 1 tab daily  The patient has been instructed regarding the correct time, dose, and frequency of taking this medication, including desired effects and most common side effects.   Leitha Hyppolite 3:58 PM 06/28/2018

## 2018-06-30 NOTE — Progress Notes (Signed)
Advanced Heart Failure Clinic Note  Cardiology: Dr. Johnsie Cancel HF Cardiology: Dr. Aundra Dubin  77 y.o. with history of CAD, persistent atrial fibrillation, and chronic diastolic CHF was referred by Dr. Johnsie Cancel for evaluation of CHF.  Patient had initial PCI to RCA in 11/1998.  This was complicated by stent thrombosis with inferior STEMI in 08/1999 requiring PCI.   In 2017, he was noted to be in atrial fibrillation persistently.  He appears to have been in atrial fibrillation since that time.  He decided not to undergo cardioversion.  Last echo in 5/19 showed EF 50-55%, severe biatrial enlargement.   He was doing well until around 5/19. At that time, he noted his HR running higher and he developed peripheral edema.  He would "give out" much more easily.  No chest pain, just felt a severe lack of energy. He would get short of breath walking short distances.  He was started on torsemide.  He was admitted in 6/19 for diuresis (acute/chronic diastolic CHF).    He was started on Tikosyn and went back into NSR.  He is in NSR today.   He presents today for followup of CHF.  Weight is down 1 lb.  Stamina is improved.  No dyspnea walking on flat ground.  No dyspnea walking up stairs.  No problems walking around stores.  No chest pain.  No palpitations, he is in NSR.  He is taking torsemide 40 mg bid.      ECG (personally reviewed): NSR, 1st degree AVB, QTc 479 msec.   Labs (7/19): K 4.2, creatinine 1.25 => 1.3, NT-proBNP 3260, hgb 16.3 Labs (8/19): K 3.9, creatinine 1.26, LDL 54  PMH: 1. Atrial fibrillation: Now in NSR on Tikosyn.  2. HTN 3. OSA: Uses CPAP.  4. CAD: PCI to RCA in 11/1998.  - Stent thrombosis with inferior STEMI and PCI RCA in 08/1999.  5. Chronic diastolic CHF: Echo (2/77) with EF 50-55%, severe biatrial enlargement.  - 6/19 CHF exacerbation, hospitalized.    Social History   Socioeconomic History  . Marital status: Married    Spouse name: scarlett  . Number of children: 3  . Years of  education: 36  . Highest education level: Not on file  Occupational History  . Occupation: retired  Scientific laboratory technician  . Financial resource strain: Not on file  . Food insecurity:    Worry: Not on file    Inability: Not on file  . Transportation needs:    Medical: Not on file    Non-medical: Not on file  Tobacco Use  . Smoking status: Former Smoker    Types: Cigarettes    Last attempt to quit: 11/02/1999    Years since quitting: 18.6  . Smokeless tobacco: Never Used  Substance and Sexual Activity  . Alcohol use: No  . Drug use: No  . Sexual activity: Not on file  Lifestyle  . Physical activity:    Days per week: Not on file    Minutes per session: Not on file  . Stress: Not on file  Relationships  . Social connections:    Talks on phone: Not on file    Gets together: Not on file    Attends religious service: Not on file    Active member of club or organization: Not on file    Attends meetings of clubs or organizations: Not on file    Relationship status: Not on file  . Intimate partner violence:    Fear of current or ex partner: Not  on file    Emotionally abused: Not on file    Physically abused: Not on file    Forced sexual activity: Not on file  Other Topics Concern  . Not on file  Social History Narrative  . Not on file   Family History  Problem Relation Age of Onset  . Heart Problems Mother   . Cancer Father    Review of systems complete and found to be negative unless listed in HPI.   Current Outpatient Medications  Medication Sig Dispense Refill  . acetaminophen (TYLENOL) 500 MG tablet Take 500 mg by mouth every 6 (six) hours as needed.    . dofetilide (TIKOSYN) 250 MCG capsule Take 1 capsule (250 mcg total) by mouth 2 (two) times daily. 60 capsule 6  . metoprolol tartrate (LOPRESSOR) 50 MG tablet Take 75 mg by mouth 2 (two) times daily.    . nitroGLYCERIN (NITROSTAT) 0.4 MG SL tablet Place 1 tablet (0.4 mg total) under the tongue every 5 (five) minutes as  needed for chest pain (3 doses max). 25 tablet 3  . potassium chloride (K-DUR,KLOR-CON) 10 MEQ tablet Take 2 tablets (20 mEq total) by mouth 2 (two) times daily. 120 tablet 11  . pravastatin (PRAVACHOL) 40 MG tablet Take 1 tablet (40 mg total) by mouth every evening. 90 tablet 2  . spironolactone (ALDACTONE) 25 MG tablet Take 1 tablet (25 mg total) by mouth daily. 30 tablet 3  . torsemide (DEMADEX) 20 MG tablet Take 2 tablets (40 mg total) by mouth 2 (two) times daily. 180 tablet 11  . XARELTO 20 MG TABS tablet TAKE 1 TABLET (20 MG TOTAL) BY MOUTH DAILY WITH SUPPER 30 tablet 6   No current facility-administered medications for this encounter.    BP 128/70   Pulse 72   Wt 82.4 kg (181 lb 9.6 oz)   SpO2 93%   BMI 24.63 kg/m   Wt Readings from Last 3 Encounters:  06/28/18 82.4 kg (181 lb 9.6 oz)  06/17/18 82 kg (180 lb 12.8 oz)  05/24/18 81.2 kg (179 lb)   General: NAD Neck: No JVD, no thyromegaly or thyroid nodule.  Lungs: Clear to auscultation bilaterally with normal respiratory effort. CV: Nondisplaced PMI.  Heart regular S1/S2, no S3/S4, no murmur.  Trace ankle edema.  No carotid bruit.  Normal pedal pulses.  Abdomen: Soft, nontender, no hepatosplenomegaly, no distention.  Skin: Intact without lesions or rashes.  Neurologic: Alert and oriented x 3.  Psych: Normal affect. Extremities: No clubbing or cyanosis.  HEENT: Normal.   Assessment/Plan: 1. Chronic diastolic CHF: Echo in 1/06 with EF 50-55%, severe biatrial enlargement.  CHF in this situation may have been potentiated by persistent atrial fibrillation.  NYHA class improved II. He now appears euvolemic. - Continue torsemide 40 mg bid and KCl 20 bid.    - BMET/Mg today 2. CAD: PCI to RCA, had stent thrombosis back in 2000.  No CP. Will hold on with further workup with marked improvement in symptoms with diuresis and NSR.  - No ASA given Xarelto use.  - Continue pravastatin, good lipids in 8/19.  3 Atrial fibrillation: Back  in NSR on Tikosyn.  QTc ok on ECG today.  Feels better in NSR.  - Continue Xarelto, CBC today.   Followup in 6 mos  [name] 06/30/2018

## 2018-07-16 NOTE — Progress Notes (Signed)
Cardiology: Dr. Johnsie Cancel HF Cardiology: Dr. Aundra Dubin  77 y.o. with CAD, PAF, Diastolic CHF. Distant PCI with IMI PCI 08/1999.  First noted to be in afib 2017 but did not want cardioversion. EF 50-55% by TTE Done 5/19 with severe bi atrial enlargement. CHF symptoms worse in May with dyspnea and edema. Admitted in June 2019 for diuresis Started on Tikosyn while in hospital with conversion to NSR.  On torsemide 40 bid for diuretics     PMH: 1. Atrial fibrillation: Now in NSR on Tikosyn.  2. HTN 3. OSA: Uses CPAP.  4. CAD: PCI to RCA in 11/1998.  - Stent thrombosis with inferior STEMI and PCI RCA in 08/1999.  5. Chronic diastolic CHF: Echo (2/54) with EF 50-55%, severe biatrial enlargement.  - 6/19 CHF exacerbation, hospitalized.    Social History   Socioeconomic History  . Marital status: Married    Spouse name: scarlett  . Number of children: 3  . Years of education: 38  . Highest education level: Not on file  Occupational History  . Occupation: retired  Scientific laboratory technician  . Financial resource strain: Not on file  . Food insecurity:    Worry: Not on file    Inability: Not on file  . Transportation needs:    Medical: Not on file    Non-medical: Not on file  Tobacco Use  . Smoking status: Former Smoker    Types: Cigarettes    Last attempt to quit: 11/02/1999    Years since quitting: 18.7  . Smokeless tobacco: Never Used  Substance and Sexual Activity  . Alcohol use: No  . Drug use: No  . Sexual activity: Not on file  Lifestyle  . Physical activity:    Days per week: Not on file    Minutes per session: Not on file  . Stress: Not on file  Relationships  . Social connections:    Talks on phone: Not on file    Gets together: Not on file    Attends religious service: Not on file    Active member of club or organization: Not on file    Attends meetings of clubs or organizations: Not on file    Relationship status: Not on file  . Intimate partner violence:    Fear of  current or ex partner: Not on file    Emotionally abused: Not on file    Physically abused: Not on file    Forced sexual activity: Not on file  Other Topics Concern  . Not on file  Social History Narrative  . Not on file   Family History  Problem Relation Age of Onset  . Heart Problems Mother   . Cancer Father    Review of systems complete and found to be negative unless listed in HPI.   Current Outpatient Medications  Medication Sig Dispense Refill  . acetaminophen (TYLENOL) 500 MG tablet Take 500 mg by mouth every 6 (six) hours as needed.    . dofetilide (TIKOSYN) 250 MCG capsule Take 1 capsule (250 mcg total) by mouth 2 (two) times daily. 60 capsule 6  . metoprolol tartrate (LOPRESSOR) 50 MG tablet Take 75 mg by mouth 2 (two) times daily.    . nitroGLYCERIN (NITROSTAT) 0.4 MG SL tablet Place 1 tablet (0.4 mg total) under the tongue every 5 (five) minutes as needed for chest pain (3 doses max). 25 tablet 3  . potassium chloride (K-DUR,KLOR-CON) 10 MEQ tablet Take 2 tablets (20 mEq total) by mouth 2 (  two) times daily. 120 tablet 11  . pravastatin (PRAVACHOL) 40 MG tablet Take 1 tablet (40 mg total) by mouth every evening. 90 tablet 2  . rivaroxaban (XARELTO) 20 MG TABS tablet Take 1 tablet (20 mg total) by mouth daily with supper. 30 tablet 0  . spironolactone (ALDACTONE) 25 MG tablet Take 1 tablet (25 mg total) by mouth daily. 90 tablet 3  . torsemide (DEMADEX) 20 MG tablet Take 2 tablets (40 mg total) by mouth 2 (two) times daily. 180 tablet 3   No current facility-administered medications for this visit.    BP 104/66   Pulse 68   Ht 6' (1.829 m)   Wt 189 lb (85.7 kg)   SpO2 97%   BMI 25.63 kg/m   Wt Readings from Last 3 Encounters:  07/25/18 189 lb (85.7 kg)  06/28/18 181 lb 9.6 oz (82.4 kg)  06/17/18 180 lb 12.8 oz (82 kg)   Affect appropriate Healthy:  appears stated age HEENT: normal Neck supple with no adenopathy JVP normal no bruits no thyromegaly Lungs clear  with no wheezing and good diaphragmatic motion Heart:  S1/S2 no murmur, no rub, gallop or click PMI normal Abdomen: benighn, BS positve, no tenderness, no AAA no bruit.  No HSM or HJR Distal pulses intact with no bruits Plus one bilateral  Edema with varicosities and some discoloration  Neuro non-focal Skin warm and dry No muscular weakness   Assessment/Plan: 1. Chronic diastolic CHF: Functional class 2 EF 50-55% TTE 02/2018 improved with restoration NSR continue torsemide and KCL  2. CAD: PCI to RCA, had stent thrombosis back in 2000.  No CP. Medical Rx not on ASA due to anticoagulation   3 Atrial fibrillation: Back in NSR on Tikosyn. QT normal long term may relapse given severe bi atrial enlargement continue xarelto  TTE in 6 months make sure EF stays improved   Followup in 6 mos  Jenkins Rouge 07/25/2018

## 2018-07-18 DIAGNOSIS — G4733 Obstructive sleep apnea (adult) (pediatric): Secondary | ICD-10-CM | POA: Diagnosis not present

## 2018-07-23 ENCOUNTER — Telehealth (HOSPITAL_COMMUNITY): Payer: Self-pay | Admitting: *Deleted

## 2018-07-23 NOTE — Telephone Encounter (Signed)
Pt approved for tikosyn assistance Pt ID #[id number] through 10/08/18

## 2018-07-25 ENCOUNTER — Ambulatory Visit: Payer: Medicare HMO | Admitting: Cardiology

## 2018-07-25 ENCOUNTER — Encounter: Payer: Self-pay | Admitting: Cardiovascular Disease

## 2018-07-25 ENCOUNTER — Ambulatory Visit: Payer: Medicare HMO | Admitting: Cardiovascular Disease

## 2018-07-25 DIAGNOSIS — I4892 Unspecified atrial flutter: Secondary | ICD-10-CM

## 2018-07-25 DIAGNOSIS — I509 Heart failure, unspecified: Secondary | ICD-10-CM

## 2018-07-25 MED ORDER — RIVAROXABAN 20 MG PO TABS: 20.0000 mg | ORAL_TABLET | Freq: Every day | ORAL | 0 refills | Status: DC

## 2018-07-25 MED ORDER — SPIRONOLACTONE 25 MG PO TABS: 25.0000 mg | ORAL_TABLET | Freq: Every day | ORAL | 3 refills | Status: DC

## 2018-07-25 MED ORDER — TORSEMIDE 20 MG PO TABS: 40.0000 mg | ORAL_TABLET | Freq: Two times a day (BID) | ORAL | 3 refills | Status: DC

## 2018-07-25 NOTE — Patient Instructions (Addendum)

## 2018-07-26 ENCOUNTER — Telehealth: Payer: Self-pay | Admitting: Cardiovascular Disease

## 2018-07-26 NOTE — Telephone Encounter (Signed)
Called pt and informed him that his medication was already sent to his pharmacy as requested and if he has any other problems, questions or concerns to give our office a call back. Pt verbalized understanding.

## 2018-07-26 NOTE — Telephone Encounter (Signed)
New Message         *STAT* If patient is at the pharmacy, call can be transferred to refill team.   1. Which medications need to be refilled? (please list name of each medication and dose if known) spironolactone (ALDACTONE) 25 MG tablet  2. Which pharmacy/location (including street and city if local pharmacy) is medication to be sent to?[facility] in [place]  3. Do they need a 30 day or 90 day supply? [place]

## 2018-07-29 ENCOUNTER — Other Ambulatory Visit: Payer: Self-pay | Admitting: Cardiovascular Disease

## 2018-08-29 ENCOUNTER — Telehealth: Payer: Self-pay | Admitting: Cardiovascular Disease

## 2018-08-29 NOTE — Telephone Encounter (Signed)
° ° °  Patient calling the office for samples of medication:   1.  What medication and dosage are you requesting samples for? Xarelto  2.  Are you currently out of this medication? 3 remaining

## 2018-08-29 NOTE — Telephone Encounter (Signed)
Spoke with patient and made him aware that I could place two weeks of samples at the front desk. He verbalized his understanding and appreciation.

## 2018-09-17 ENCOUNTER — Ambulatory Visit (HOSPITAL_COMMUNITY)
Admission: RE | Admit: 2018-09-17 | Discharge: 2018-09-17 | Disposition: A | Discharge status: Home / Self Care | Payer: Medicare HMO | Source: Ambulatory Visit | Attending: Nurse Practitioner | Admitting: Nurse Practitioner

## 2018-09-17 ENCOUNTER — Encounter (HOSPITAL_COMMUNITY): Payer: Self-pay | Admitting: Nurse Practitioner

## 2018-09-17 ENCOUNTER — Other Ambulatory Visit (HOSPITAL_COMMUNITY): Payer: Self-pay | Admitting: *Deleted

## 2018-09-17 VITALS — BP 98/72 | HR 66 | Ht 72.0 in | Wt 193.2 lb

## 2018-09-17 DIAGNOSIS — I4819 Other persistent atrial fibrillation: Secondary | ICD-10-CM | POA: Diagnosis not present

## 2018-09-17 DIAGNOSIS — I4891 Unspecified atrial fibrillation: Secondary | ICD-10-CM | POA: Diagnosis not present

## 2018-09-17 DIAGNOSIS — Z79899 Other long term (current) drug therapy: Secondary | ICD-10-CM | POA: Insufficient documentation

## 2018-09-17 DIAGNOSIS — E785 Hyperlipidemia, unspecified: Secondary | ICD-10-CM | POA: Diagnosis not present

## 2018-09-17 DIAGNOSIS — E7800 Pure hypercholesterolemia, unspecified: Secondary | ICD-10-CM | POA: Diagnosis not present

## 2018-09-17 DIAGNOSIS — Z955 Presence of coronary angioplasty implant and graft: Secondary | ICD-10-CM | POA: Diagnosis not present

## 2018-09-17 DIAGNOSIS — Z888 Allergy status to other drugs, medicaments and biological substances status: Secondary | ICD-10-CM | POA: Diagnosis not present

## 2018-09-17 DIAGNOSIS — I2510 Atherosclerotic heart disease of native coronary artery without angina pectoris: Secondary | ICD-10-CM | POA: Diagnosis not present

## 2018-09-17 DIAGNOSIS — I252 Old myocardial infarction: Secondary | ICD-10-CM | POA: Insufficient documentation

## 2018-09-17 DIAGNOSIS — Z87891 Personal history of nicotine dependence: Secondary | ICD-10-CM | POA: Insufficient documentation

## 2018-09-17 DIAGNOSIS — Z7901 Long term (current) use of anticoagulants: Secondary | ICD-10-CM | POA: Diagnosis not present

## 2018-09-17 DIAGNOSIS — I10 Essential (primary) hypertension: Secondary | ICD-10-CM | POA: Insufficient documentation

## 2018-09-17 LAB — BASIC METABOLIC PANEL
ANION GAP: 14 (ref 5–15)
BUN: 27 mg/dL — ABNORMAL HIGH (ref 8–23)
CALCIUM: 9.2 mg/dL (ref 8.9–10.3)
CO2: 24 mmol/L (ref 22–32)
Chloride: 98 mmol/L (ref 98–111)
Creatinine, Ser: 1.67 mg/dL — ABNORMAL HIGH (ref 0.61–1.24)
GFR, EST AFRICAN AMERICAN: 46 mL/min — AB (ref >60)
GFR, EST NON AFRICAN AMERICAN: 39 mL/min — AB (ref >60)
Glucose, Bld: 133 mg/dL — ABNORMAL HIGH (ref 70–99)
Potassium: 4.5 mmol/L (ref 3.5–5.1)
Sodium: 136 mmol/L (ref 135–145)

## 2018-09-17 LAB — MAGNESIUM: MAGNESIUM: 2.3 mg/dL (ref 1.7–2.4)

## 2018-09-17 MED ORDER — DOFETILIDE 250 MCG PO CAPS: 250.0000 ug | ORAL_CAPSULE | Freq: Two times a day (BID) | ORAL | 3 refills | Status: DC

## 2018-09-17 NOTE — Progress Notes (Signed)
Primary Care Physician: Patient, No Pcp Per Referring Physician: Dr. Johnsie Cancel Cardiologist: Dr. Kriste Basque Tomey is a 77 y.o. male with a h/o hyperlipidemia, CAD, multiple stents of the RCA  that presented to his routine office visit with Dr. Johnsie Cancel, 12/01/16 and asymptomatic flutter was found on EKG.He was clear that he was  asymptomatic and did not want to purse cardioversion, He was  started on xarelto 20 mg for a chadsvasc score of at least 3.  He continued to be asymptomatic until the last few months and was referred to Dr. Aundra Dubin by Dr. Johnsie Cancel 05/10/18, for CHF. He was admitted this past June for diuresis. Dr. Aundra Dubin thought his recent symptoms could be 2/2 chronic afib and wanted him to try Tikosyn to see if SR could be restored and if his symptoms would improve.  He is in the afib clinic for [place] for [place]. No benadryl use and no missed does of anticoagulation. Pt does fit guidelines for Tikosyn assistance and his forms have been filled out.  F/u 05/24/18 for loading of tikosyn. He converted with one dose of the drug and continues in SR.  Qtc in the hospital 713 ms on d/c from hospital with hand written qtc corrected to 440/458 ms  by EP. Dr. Curt Bears reviewed EKG today and felt the qt was corrected to 440 ms. He feels much better in SR, does not feel his is having as much swelling and is wanting to reduce his torsemide as his BP's at home are soft, today at 98/64.  F/u in afib clinc, 12/10. He is in SR. Has not noted any afib. He does feel like his energy has been low, more of a chronic issue. Also keeps a cough.Complinat with Tikosyn.  Today, he denies symptoms of palpitations, chest pain, shortness of breath, orthopnea, PND, lower extremity edema, dizziness, presyncope, syncope, or neurologic sequela. The patient is tolerating medications without difficulties and is otherwise without complaint today.   Past Medical History:  Diagnosis Date  . Coronary artery disease    status  post remote PCI of the mid to distal RCA 11/1998 followed by acute ST elevation MI inferiorly 08/1999 with occlusion of the prior RCA stent as well as 70% diagonal branch.  He underwent PCI of the RCA  . Hypercholesterolemia   . Hypertension   . Myocardial infarct ([place])   . Olecranon bursitis    Past Surgical History:  Procedure Laterality Date  . CARDIAC CATHETERIZATION  12/15/1999   EF was 55%   . CORONARY ANGIOPLASTY WITH STENT PLACEMENT  08/15/1999   CAD, status post prior stenting of the mid to distal RCA in 11/1998, with an acute diaphragmatic wall infarction due to total occlusion at the stent site/There is also 70% narrowing in the diagonal branch of the LAD/The LV showed inferior wall hypo- akinesis.-- Successful reperfusion, percutaneous transluminal coronary percutaneous transluminal coronary & placement of a 2nd overlying stent in RCA    Current Outpatient Medications  Medication Sig Dispense Refill  . acetaminophen (TYLENOL) 500 MG tablet Take 500 mg by mouth every 6 (six) hours as needed.    . loratadine (CLARITIN) 10 MG tablet Take 10 mg by mouth daily.    . metoprolol tartrate (LOPRESSOR) 50 MG tablet Take 75 mg by mouth 2 (two) times daily.    . nitroGLYCERIN (NITROSTAT) 0.4 MG SL tablet Place 1 tablet (0.4 mg total) under the tongue every 5 (five) minutes as needed for chest pain (3 doses max). 25 tablet  3  . potassium chloride (K-DUR,KLOR-CON) 10 MEQ tablet Take 2 tablets (20 mEq total) by mouth 2 (two) times daily. 120 tablet 11  . pravastatin (PRAVACHOL) 40 MG tablet Take 1 tablet (40 mg total) by mouth every evening. 90 tablet 2  . rivaroxaban (XARELTO) 20 MG TABS tablet Take 1 tablet (20 mg total) by mouth daily with supper. 30 tablet 0  . spironolactone (ALDACTONE) 25 MG tablet Take 1 tablet (25 mg total) by mouth daily. 90 tablet 3  . torsemide (DEMADEX) 20 MG tablet Take 2 tablets (40 mg total) by mouth 2 (two) times daily. 180 tablet 3  . dofetilide (TIKOSYN) 250  MCG capsule Take 1 capsule (250 mcg total) by mouth 2 (two) times daily. 180 capsule 3   No current facility-administered medications for this encounter.     Allergies  Allergen Reactions  . Plavix [Clopidogrel Bisulfate] Anaphylaxis         Social History   Socioeconomic History  . Marital status: Married    Spouse name: scarlett  . Number of children: 3  . Years of education: 63  . Highest education level: Not on file  Occupational History  . Occupation: retired  Scientific laboratory technician  . Financial resource strain: Not on file  . Food insecurity:    Worry: Not on file    Inability: Not on file  . Transportation needs:    Medical: Not on file    Non-medical: Not on file  Tobacco Use  . Smoking status: Former Smoker    Types: Cigarettes    Last attempt to quit: 11/02/1999    Years since quitting: 18.8  . Smokeless tobacco: Never Used  Substance and Sexual Activity  . Alcohol use: No  . Drug use: No  . Sexual activity: Not on file  Lifestyle  . Physical activity:    Days per week: Not on file    Minutes per session: Not on file  . Stress: Not on file  Relationships  . Social connections:    Talks on phone: Not on file    Gets together: Not on file    Attends religious service: Not on file    Active member of club or organization: Not on file    Attends meetings of clubs or organizations: Not on file    Relationship status: Not on file  . Intimate partner violence:    Fear of current or ex partner: Not on file    Emotionally abused: Not on file    Physically abused: Not on file    Forced sexual activity: Not on file  Other Topics Concern  . Not on file  Social History Narrative  . Not on file    Family History  Problem Relation Age of Onset  . Heart Problems Mother   . Cancer Father     ROS- All systems are reviewed and negative except as per the HPI above  Physical Exam: Vitals:   09/17/18 1102  BP: 98/72  Pulse: 66  Weight: 87.6 kg  Height: 6' (1.829  m)   Wt Readings from Last 3 Encounters:  09/17/18 87.6 kg  07/25/18 85.7 kg  06/28/18 82.4 kg    Labs: Lab Results  Component Value Date   NA 136 09/17/2018   K 4.5 09/17/2018   CL 98 09/17/2018   CO2 24 09/17/2018   GLUCOSE 133 (H) 09/17/2018   BUN 27 (H) 09/17/2018   CREATININE 1.67 (H) 09/17/2018   CALCIUM 9.2 09/17/2018  MG 2.3 09/17/2018   No results found for: INR Lab Results  Component Value Date   CHOL 101 05/10/2018   HDL 35 (L) 05/10/2018   LDLCALC 54 05/10/2018   TRIG 58 05/10/2018     GEN- The patient is well appearing, alert and oriented x 3 today.   Head- normocephalic, atraumatic Eyes-  Sclera clear, conjunctiva pink Ears- hearing intact Oropharynx- clear Neck- supple, no JVP Lymph- no cervical lymphadenopathy Lungs- Clear to ausculation bilaterally, normal work of breathing Heart- regular rate and rhythm, no murmurs, rubs or gallops, PMI not laterally displaced GI- soft, NT, ND, + BS Extremities- no clubbing, cyanosis, or edema MS- no significant deformity or atrophy Skin- no rash or lesion Psych- euthymic mood, full affect Neuro- strength and sensation are intact  EKG- SR with first degree av block at 66 bpm, pr int 232 ms, qrs int 86 ms, qtc 482 ms  Epic records reviewed Echo- 2019-- Left ventricle: The cavity size was normal. Wall thickness was   normal. Systolic function was at the lower limits of normal. The   estimated ejection fraction was in the range of 50% to 55%.   Severe hypokinesis of the basalinferolateral, inferior, and   inferoseptal myocardium; consistent with infarction in the   distribution of the right coronary artery. - Mitral valve: Calcified annulus. - Left atrium: The atrium was severely dilated. - Right atrium: The atrium was severely dilated. - Pulmonary arteries: Systolic pressure was mildly increased. PA   peak pressure: 33 mm Hg (S). - Pericardium, extracardiac: A small pericardial effusion was   identified  circumferential to the heart. There was no evidence of   hemodynamic compromise.    Assessment and Plan: 1. Afib/flutter Pt had been in persistent afib/flutter since late 2017, early 2018, in [place] with Tikosyn since August 2019 Pt initially resisted any treatment for afib, since he was asymptomatic Dr. Aundra Dubin evaluated and recommended Tikosyn which pt had successful loading   Continue 250 mcg bid Reminded not to miss doses or combine with other qtc prolonging  drugs Continue xarelto 20 mg daily with a [id number] score of at least 3 Reminded no benadryl use Bmet/mag today  2. Soft BP's BP's run low by history He is on usual diuretics, no recent increase   F/u with Dr. Aundra Dubin in March/Dr. Johnsie Cancel in April afib clinic as needed  Geroge Baseman. Robert Sunga, [facility] [address] [place], [place] [address] [phone]

## 2018-09-20 ENCOUNTER — Other Ambulatory Visit: Payer: Self-pay | Admitting: Cardiovascular Disease

## 2018-09-20 DIAGNOSIS — I4891 Unspecified atrial fibrillation: Secondary | ICD-10-CM

## 2018-09-23 DIAGNOSIS — H40053 Ocular hypertension, bilateral: Secondary | ICD-10-CM | POA: Diagnosis not present

## 2018-09-23 DIAGNOSIS — H524 Presbyopia: Secondary | ICD-10-CM | POA: Diagnosis not present

## 2018-10-02 ENCOUNTER — Other Ambulatory Visit: Payer: Self-pay | Admitting: Cardiovascular Disease

## 2018-10-03 ENCOUNTER — Other Ambulatory Visit (HOSPITAL_COMMUNITY): Payer: Medicare HMO

## 2018-10-04 ENCOUNTER — Ambulatory Visit (HOSPITAL_COMMUNITY)
Admission: RE | Admit: 2018-10-04 | Discharge: 2018-10-04 | Disposition: A | Discharge status: Home / Self Care | Payer: Medicare HMO | Source: Ambulatory Visit | Attending: Internal Medicine | Admitting: Internal Medicine

## 2018-10-04 ENCOUNTER — Telehealth (HOSPITAL_COMMUNITY): Payer: Self-pay

## 2018-10-04 ENCOUNTER — Telehealth (HOSPITAL_COMMUNITY): Payer: Self-pay | Admitting: *Deleted

## 2018-10-04 DIAGNOSIS — I5022 Chronic systolic (congestive) heart failure: Secondary | ICD-10-CM | POA: Insufficient documentation

## 2018-10-04 DIAGNOSIS — I509 Heart failure, unspecified: Secondary | ICD-10-CM

## 2018-10-04 LAB — BASIC METABOLIC PANEL
ANION GAP: 13 (ref 5–15)
BUN: 30 mg/dL — AB (ref 8–23)
CHLORIDE: 101 mmol/L (ref 98–111)
CO2: 24 mmol/L (ref 22–32)
Calcium: 9.3 mg/dL (ref 8.9–10.3)
Creatinine, Ser: 1.55 mg/dL — ABNORMAL HIGH (ref 0.61–1.24)
GFR calc non Af Amer: 43 mL/min — ABNORMAL LOW (ref >60)
GFR, EST AFRICAN AMERICAN: 50 mL/min — AB (ref >60)
Glucose, Bld: 123 mg/dL — ABNORMAL HIGH (ref 70–99)
POTASSIUM: 4.3 mmol/L (ref 3.5–5.1)
SODIUM: 138 mmol/L (ref 135–145)

## 2018-10-04 MED ORDER — METOPROLOL TARTRATE 50 MG PO TABS: 50.0000 mg | ORAL_TABLET | Freq: Two times a day (BID) | ORAL | 2 refills | Status: DC

## 2018-10-04 NOTE — Telephone Encounter (Signed)
Has mild CRF labs ok if he gets dizzy from time to time with low bp can cut demedex back to 20 mg a day for 2 days then back to 40 mg

## 2018-10-04 NOTE — Telephone Encounter (Signed)
Cut aldactone down to 12.5 daily and demedex to 40 mg daily f/u BMET and BNP in 3 weeks

## 2018-10-04 NOTE — Telephone Encounter (Signed)
Called patient with recommendations. Patient stated he is not feeling dizzy, he is feeling tired and weak daily. Patient stated his BP is always low and that he feels fine in the morning until he takes his morning medications. Patient takes Tikosyn, metoprolol at 9:00 am, Demadex and Potassium at 10:00 am, Spironolactone at 12:00 to space it out. Will forward to Dr. Johnsie Cancel for further advisement.

## 2018-10-04 NOTE — Telephone Encounter (Signed)
Larry [name],   I was looking through [employer].   This looks like a patient that you and Heart Failure have been following .  Abbe Amsterdam

## 2018-10-04 NOTE — Telephone Encounter (Signed)
1 bottle of Xarelto 20mg  sample given. Lot # [id number]. Expiration 08/21

## 2018-10-04 NOTE — Telephone Encounter (Signed)
113/77 today, but has had some much lower.  Between  90/65 as low as as in the 03O systolic.  He reports no dizziness, just fatigue and weakness.  Pt would like to discuss with a triage nurse his medications and his blood pressure.  Please call patient asap to discuss

## 2018-10-07 MED ORDER — SPIRONOLACTONE 25 MG PO TABS: 12.5000 mg | ORAL_TABLET | Freq: Every day | ORAL | 3 refills | Status: DC

## 2018-10-07 MED ORDER — TORSEMIDE 20 MG PO TABS: 40.0000 mg | ORAL_TABLET | Freq: Every day | ORAL | 3 refills | Status: DC

## 2018-10-07 NOTE — Telephone Encounter (Signed)
Called patient about Dr. Kyla Balzarine recommendations. Per Dr. Johnsie Cancel, cut aldactone down to 12.5 mg daily and demadex to 40 mg daily f/u BMET and BNP in 3 weeks. Patient verbalized understanding. Patient will come in on 10/28/18 for lab work.

## 2018-10-08 DIAGNOSIS — H524 Presbyopia: Secondary | ICD-10-CM | POA: Diagnosis not present

## 2018-10-28 ENCOUNTER — Other Ambulatory Visit: Payer: Self-pay

## 2018-10-28 ENCOUNTER — Other Ambulatory Visit: Payer: PPO | Admitting: *Deleted

## 2018-10-28 DIAGNOSIS — I5043 Acute on chronic combined systolic (congestive) and diastolic (congestive) heart failure: Secondary | ICD-10-CM

## 2018-10-28 DIAGNOSIS — I509 Heart failure, unspecified: Secondary | ICD-10-CM

## 2018-10-28 NOTE — Progress Notes (Signed)
BMET needed to be under [facility] not hospital lab. Reordered BMET

## 2018-10-29 ENCOUNTER — Telehealth: Payer: Self-pay | Admitting: *Deleted

## 2018-10-29 LAB — BASIC METABOLIC PANEL
BUN/Creatinine Ratio: 20 (ref 10–24)
BUN: 26 mg/dL (ref 8–27)
CO2: 22 mmol/L (ref 20–29)
Calcium: 9.1 mg/dL (ref 8.6–10.2)
Chloride: 97 mmol/L (ref 96–106)
Creatinine, Ser: 1.3 mg/dL — ABNORMAL HIGH (ref 0.76–1.27)
GFR, EST AFRICAN AMERICAN: 62 mL/min/{1.73_m2} (ref >59)
GFR, EST NON AFRICAN AMERICAN: 53 mL/min/{1.73_m2} — AB (ref >59)
Glucose: 113 mg/dL — ABNORMAL HIGH (ref 65–99)
POTASSIUM: 4.4 mmol/L (ref 3.5–5.2)
SODIUM: 137 mmol/L (ref 134–144)

## 2018-10-29 LAB — PRO B NATRIURETIC PEPTIDE: NT-Pro BNP: 1539 pg/mL — ABNORMAL HIGH (ref 0–486)

## 2018-10-29 MED ORDER — TORSEMIDE 20 MG PO TABS: 60.0000 mg | ORAL_TABLET | Freq: Two times a day (BID) | ORAL | 0 refills | Status: DC

## 2018-10-29 NOTE — Telephone Encounter (Signed)
-----   Message from Josue Hector, MD sent at 10/29/2018  8:02 AM EST ----- Lytes stable

## 2018-10-29 NOTE — Telephone Encounter (Signed)
I s/w pt and his wife in regards to lab results. I advised per Dr. Johnsie Cancel BNP (fluid marker) is elevated and need to increase Torsemide to 60 mg BID and needs f/u ASAP with Dr. Aundra Dubin in the [facility]. Pt is agreeable to plan of care. I advised I will have scheduler call him to get appt with Dr. Aundra Dubin ASAP per Dr. Johnsie Cancel. I asked pt if he needs a refill on the Torsemide. Pt states no that he just picked up an Rx recently. Pt states does he need to increase his K+ as well. I advised I will d/w Dr. Johnsie Cancel further about the K+ dosage and we will call him back. Pt is concerned this is a big jump on his dosage for the Torsemide. I explained to the pt the fluid marker is fairly elevated and this is too much fluid around the heart and making his heart work harder. Pt asked was this why he feels so SOB. I confirmed yes. Pt thanked me for answering their questions.

## 2018-11-13 ENCOUNTER — Ambulatory Visit (HOSPITAL_COMMUNITY)
Admission: RE | Admit: 2018-11-13 | Discharge: 2018-11-13 | Disposition: A | Discharge status: Home / Self Care | Payer: PPO | Source: Ambulatory Visit | Attending: Cardiology | Admitting: Cardiology

## 2018-11-13 VITALS — BP 110/72 | HR 78 | Wt 187.8 lb

## 2018-11-13 DIAGNOSIS — I4891 Unspecified atrial fibrillation: Secondary | ICD-10-CM

## 2018-11-13 DIAGNOSIS — I2510 Atherosclerotic heart disease of native coronary artery without angina pectoris: Secondary | ICD-10-CM | POA: Diagnosis not present

## 2018-11-13 DIAGNOSIS — G4733 Obstructive sleep apnea (adult) (pediatric): Secondary | ICD-10-CM | POA: Diagnosis not present

## 2018-11-13 DIAGNOSIS — Z955 Presence of coronary angioplasty implant and graft: Secondary | ICD-10-CM | POA: Diagnosis not present

## 2018-11-13 DIAGNOSIS — Z87891 Personal history of nicotine dependence: Secondary | ICD-10-CM | POA: Insufficient documentation

## 2018-11-13 DIAGNOSIS — I4819 Other persistent atrial fibrillation: Secondary | ICD-10-CM | POA: Diagnosis not present

## 2018-11-13 DIAGNOSIS — I5022 Chronic systolic (congestive) heart failure: Secondary | ICD-10-CM | POA: Diagnosis not present

## 2018-11-13 DIAGNOSIS — I110 Hypertensive heart disease with heart failure: Secondary | ICD-10-CM | POA: Insufficient documentation

## 2018-11-13 DIAGNOSIS — I5031 Acute diastolic (congestive) heart failure: Secondary | ICD-10-CM

## 2018-11-13 DIAGNOSIS — I252 Old myocardial infarction: Secondary | ICD-10-CM | POA: Insufficient documentation

## 2018-11-13 DIAGNOSIS — Z79899 Other long term (current) drug therapy: Secondary | ICD-10-CM | POA: Insufficient documentation

## 2018-11-13 DIAGNOSIS — I5032 Chronic diastolic (congestive) heart failure: Secondary | ICD-10-CM | POA: Insufficient documentation

## 2018-11-13 DIAGNOSIS — Z7901 Long term (current) use of anticoagulants: Secondary | ICD-10-CM | POA: Insufficient documentation

## 2018-11-13 LAB — BASIC METABOLIC PANEL
Anion gap: 12 (ref 5–15)
BUN: 28 mg/dL — AB (ref 8–23)
CHLORIDE: 99 mmol/L (ref 98–111)
CO2: 25 mmol/L (ref 22–32)
CREATININE: 1.52 mg/dL — AB (ref 0.61–1.24)
Calcium: 9.1 mg/dL (ref 8.9–10.3)
GFR calc Af Amer: 51 mL/min — ABNORMAL LOW (ref >60)
GFR calc non Af Amer: 44 mL/min — ABNORMAL LOW (ref >60)
Glucose, Bld: 126 mg/dL — ABNORMAL HIGH (ref 70–99)
Potassium: 3.6 mmol/L (ref 3.5–5.1)
SODIUM: 136 mmol/L (ref 135–145)

## 2018-11-13 LAB — CBC
HCT: 37.3 % — ABNORMAL LOW (ref 39.0–52.0)
Hemoglobin: 10.9 g/dL — ABNORMAL LOW (ref 13.0–17.0)
MCH: 24.4 pg — AB (ref 26.0–34.0)
MCHC: 29.2 g/dL — AB (ref 30.0–36.0)
MCV: 83.4 fL (ref 80.0–100.0)
PLATELETS: 275 10*3/uL (ref 150–400)
RBC: 4.47 MIL/uL (ref 4.22–5.81)
RDW: 17.5 % — ABNORMAL HIGH (ref 11.5–15.5)
WBC: 9.2 10*3/uL (ref 4.0–10.5)
nRBC: 0 % (ref 0.0–0.2)

## 2018-11-13 LAB — MAGNESIUM: MAGNESIUM: 2.4 mg/dL (ref 1.7–2.4)

## 2018-11-13 MED ORDER — TORSEMIDE 20 MG PO TABS: 80.0000 mg | ORAL_TABLET | Freq: Two times a day (BID) | ORAL | 3 refills | Status: DC

## 2018-11-13 MED ORDER — POTASSIUM CHLORIDE CRYS ER 10 MEQ PO TBCR: 40.0000 meq | EXTENDED_RELEASE_TABLET | Freq: Two times a day (BID) | ORAL | 11 refills | Status: DC

## 2018-11-13 MED ORDER — DOFETILIDE 125 MCG PO CAPS: 250.0000 ug | ORAL_CAPSULE | Freq: Two times a day (BID) | ORAL | Status: DC

## 2018-11-13 MED ORDER — POTASSIUM CHLORIDE CRYS ER 10 MEQ PO TBCR: 30.0000 meq | EXTENDED_RELEASE_TABLET | Freq: Two times a day (BID) | ORAL | 11 refills | Status: DC

## 2018-11-13 NOTE — Patient Instructions (Addendum)
INCREASE Torsemide to 80mg  twice daily.  INCREASE Potassium to 28meq twice daily.  DECREASE Tikosyn to 125mg  twice daily.   EKG with afib 11/14/2018 at 10:00am.  Routine lab work today. Will notify you of abnormal results  Repeat labs in 10days.  Your provider requests you have a PYP exam.  Follow up in 2 weeks.

## 2018-11-13 NOTE — Progress Notes (Signed)
Advanced Heart Failure Clinic Note  Cardiology: Dr. Johnsie Cancel HF Cardiology: Dr. Aundra Dubin  78 y.o. with history of CAD, persistent atrial fibrillation, and chronic diastolic CHF was referred by Dr. Johnsie Cancel for evaluation of CHF.  Patient had initial PCI to RCA in 11/1998.  This was complicated by stent thrombosis with inferior STEMI in 08/1999 requiring PCI.   In 2017, he was noted to be in atrial fibrillation persistently.  He appears to have been in atrial fibrillation since that time.  He decided not to undergo cardioversion.  Echo in 5/19 showed EF 50-55%, severe biatrial enlargement.   He was doing well until around 5/19. At that time, he noted his HR running higher and he developed peripheral edema.  He would "give out" much more easily.  No chest pain, just felt a severe lack of energy. He would get short of breath walking short distances.  He was started on torsemide.  He was admitted in 6/19 for diuresis (acute/chronic diastolic CHF).    He was started on Tikosyn and went back into NSR.  He is in NSR today.   A few weeks ago, he called in to the office complaining of generalized weakness.  He was told to decrease spironolactone to 12.5 mg daily and torsemide to 40 mg daily.  After that, his weight started to go up. About 2 weeks ago, he called back to the office with increasing weight and swelling and was told to increase torsemide to 60 mg bid.  He says that his weight has started to come down again. He is still up 6 lbs compared to last appt in this office.  He denies dyspnea but is fatigued after walking about 50 yards.  Mild dyspnea with stairs.  No orthopnea/PND.  No chest pain.   ECG (personally reviewed): NSR, 1st degree AVB, QTc ?630 msec but hard to see T waves (low amplitude).   Labs (7/19): K 4.2, creatinine 1.25 => 1.3, NT-proBNP 3260, hgb 16.3 Labs (8/19): K 3.9, creatinine 1.26, LDL 54 Labs (1/20): pro-BNP 1439, K 4.4, creatinine 1.3  PMH: 1. Atrial fibrillation: Now in NSR on  Tikosyn.  2. HTN 3. OSA: Uses CPAP.  4. CAD: PCI to RCA in 11/1998.  - Stent thrombosis with inferior STEMI and PCI RCA in 08/1999.  5. Chronic diastolic CHF: Echo (3/81) with EF 50-55%, severe biatrial enlargement.  - 6/19 CHF exacerbation, hospitalized.    Social History   Socioeconomic History  . Marital status: Married    Spouse name: scarlett  . Number of children: 3  . Years of education: 3  . Highest education level: Not on file  Occupational History  . Occupation: retired  Scientific laboratory technician  . Financial resource strain: Not on file  . Food insecurity:    Worry: Not on file    Inability: Not on file  . Transportation needs:    Medical: Not on file    Non-medical: Not on file  Tobacco Use  . Smoking status: Former Smoker    Types: Cigarettes    Last attempt to quit: 11/02/1999    Years since quitting: 19.0  . Smokeless tobacco: Never Used  Substance and Sexual Activity  . Alcohol use: No  . Drug use: No  . Sexual activity: Not on file  Lifestyle  . Physical activity:    Days per week: Not on file    Minutes per session: Not on file  . Stress: Not on file  Relationships  . Social connections:  Talks on phone: Not on file    Gets together: Not on file    Attends religious service: Not on file    Active member of club or organization: Not on file    Attends meetings of clubs or organizations: Not on file    Relationship status: Not on file  . Intimate partner violence:    Fear of current or ex partner: Not on file    Emotionally abused: Not on file    Physically abused: Not on file    Forced sexual activity: Not on file  Other Topics Concern  . Not on file  Social History Narrative  . Not on file   Family History  Problem Relation Age of Onset  . Heart Problems Mother   . Cancer Father    Review of systems complete and found to be negative unless listed in HPI.   Current Outpatient Medications  Medication Sig Dispense Refill  . acetaminophen  (TYLENOL) 500 MG tablet Take 500 mg by mouth every 6 (six) hours as needed.    . dofetilide (TIKOSYN) 125 MCG capsule Take 2 capsules (250 mcg total) by mouth 2 (two) times daily.    Marland Kitchen loratadine (CLARITIN) 10 MG tablet Take 10 mg by mouth daily.    . metoprolol tartrate (LOPRESSOR) 50 MG tablet Take 1 tablet (50 mg total) by mouth 2 (two) times daily. 270 tablet 2  . nitroGLYCERIN (NITROSTAT) 0.4 MG SL tablet Place 1 tablet (0.4 mg total) under the tongue every 5 (five) minutes as needed for chest pain (3 doses max). 25 tablet 3  . potassium chloride (K-DUR,KLOR-CON) 10 MEQ tablet Take 4 tablets (40 mEq total) by mouth 2 (two) times daily. 240 tablet 11  . pravastatin (PRAVACHOL) 40 MG tablet TAKE 1 TABLET BY MOUTH EVERY DAY IN THE EVENING 90 tablet 3  . rivaroxaban (XARELTO) 20 MG TABS tablet Take 1 tablet (20 mg total) by mouth daily with supper. 30 tablet 0  . spironolactone (ALDACTONE) 25 MG tablet Take 0.5 tablets (12.5 mg total) by mouth daily. 45 tablet 3  . torsemide (DEMADEX) 20 MG tablet Take 4 tablets (80 mg total) by mouth 2 (two) times daily. 240 tablet 3   No current facility-administered medications for this encounter.    BP 110/72   Pulse 78   Wt 85.2 kg (187 lb 12.8 oz)   SpO2 92%   BMI 25.47 kg/m   Wt Readings from Last 3 Encounters:  11/13/18 85.2 kg (187 lb 12.8 oz)  09/17/18 87.6 kg (193 lb 3.2 oz)  07/25/18 85.7 kg (189 lb)   General: NAD Neck: JVP 10-12 cm, no thyromegaly or thyroid nodule.  Lungs: Clear to auscultation bilaterally with normal respiratory effort. CV: Nondisplaced PMI.  Heart regular S1/S2, no S3/S4, no murmur.  1+ left ankle edema.  No carotid bruit.  Normal pedal pulses.  Abdomen: Soft, nontender, no hepatosplenomegaly, no distention.  Skin: Intact without lesions or rashes.  Neurologic: Alert and oriented x 3.  Psych: Normal affect. Extremities: No clubbing or cyanosis.  HEENT: Normal.   Assessment/Plan: 1. Chronic diastolic CHF: Echo in  6/27 with EF 50-55%, severe biatrial enlargement.  Worsening volume overload since torsemide cut back, but still volume overloaded today even after increasing torsemide back to 60 mg bid.  - Increase torsemide to 80 mg bid.  BMET today and again in 10 days.     - With atrial fibrillation and prominent diastolic CHF, I think that cardiac amyloidosis is a  concern.  I will arrange for a PYP scan to look for transthyretin amyloidosis and a myeloma panel.  If creatinine remains stable, a cardiac MRI can also be done (creatinine has been borderline for gadolinium).  2. CAD: PCI to RCA, had stent thrombosis back in 2000.  No CP.  - No ASA given Xarelto use.  - Continue pravastatin, good lipids in 8/19.  3 Atrial fibrillation: Back in NSR on Tikosyn.  Today's ECG shows low amplitude T waves but think that QTc is prolonged.  Discussed with Dr. Rayann Heman.  - Continue Xarelto.  - Decrease Tikosyn to 125 mcg bid and increase KCl to 40 mEq bid.  He will come back in tomorrow morning to atrial fibrillation clinic for ECG.  If QTc remains long, may have to come off Tikosyn.  In that case, amiodarone would be an option.   Followup in 2 wks with NP/PA to reassess volume.   [name] 11/13/2018

## 2018-11-14 ENCOUNTER — Ambulatory Visit (HOSPITAL_COMMUNITY)
Admission: RE | Admit: 2018-11-14 | Discharge: 2018-11-14 | Disposition: A | Discharge status: Home / Self Care | Payer: PPO | Source: Ambulatory Visit | Attending: Nurse Practitioner | Admitting: Nurse Practitioner

## 2018-11-14 DIAGNOSIS — I4891 Unspecified atrial fibrillation: Secondary | ICD-10-CM | POA: Insufficient documentation

## 2018-11-14 DIAGNOSIS — R9431 Abnormal electrocardiogram [ECG] [EKG]: Secondary | ICD-10-CM | POA: Diagnosis not present

## 2018-11-14 NOTE — Progress Notes (Signed)
Pt in for f/u qt interval after reduction of Tikosyn yesterday to 125 mcg bid at Dr. Jackalyn Lombard recommendation for qt at 630 ms on 250 mcg bid. Today EKG shows SR with first degree AV block with qtc improved at 575 ms. Dr. Rayann Heman feels it is acceptable and says to continue tiksoyn with f/u EKG tomorrow with labs. He will be seen at 11:30 tomorrow.

## 2018-11-15 ENCOUNTER — Ambulatory Visit (HOSPITAL_COMMUNITY)
Admission: RE | Admit: 2018-11-15 | Discharge: 2018-11-15 | Disposition: A | Discharge status: Home / Self Care | Payer: PPO | Source: Ambulatory Visit | Attending: Nurse Practitioner | Admitting: Nurse Practitioner

## 2018-11-15 DIAGNOSIS — R9431 Abnormal electrocardiogram [ECG] [EKG]: Secondary | ICD-10-CM | POA: Diagnosis not present

## 2018-11-15 DIAGNOSIS — I4891 Unspecified atrial fibrillation: Secondary | ICD-10-CM | POA: Insufficient documentation

## 2018-11-15 LAB — BASIC METABOLIC PANEL
Anion gap: 11 (ref 5–15)
BUN: 28 mg/dL — ABNORMAL HIGH (ref 8–23)
CO2: 28 mmol/L (ref 22–32)
Calcium: 9.5 mg/dL (ref 8.9–10.3)
Chloride: 97 mmol/L — ABNORMAL LOW (ref 98–111)
Creatinine, Ser: 1.51 mg/dL — ABNORMAL HIGH (ref 0.61–1.24)
GFR calc Af Amer: 52 mL/min — ABNORMAL LOW (ref >60)
GFR calc non Af Amer: 45 mL/min — ABNORMAL LOW (ref >60)
Glucose, Bld: 121 mg/dL — ABNORMAL HIGH (ref 70–99)
Potassium: 4.5 mmol/L (ref 3.5–5.1)
Sodium: 136 mmol/L (ref 135–145)

## 2018-11-15 LAB — MAGNESIUM: Magnesium: 2.6 mg/dL — ABNORMAL HIGH (ref 1.7–2.4)

## 2018-11-15 NOTE — Progress Notes (Signed)
Pt in for repeat ekg for long qt. His K+ was replaced and checked to day and now over 4.0, prior 3.6. His qt corrected today is 501-520 by Dr. Caryl Comes, but  he feels he still has the influence of the prior dose 250 mcg bid in his system, he has improved from 630 ms 3 days ago with reducing Tikosyn to 125 mcg bid, he will return on Monday for repeat EKG.

## 2018-11-16 LAB — MULTIPLE MYELOMA PANEL, SERUM
ALPHA 1: 0.3 g/dL (ref 0.0–0.4)
ALPHA2 GLOB SERPL ELPH-MCNC: 0.9 g/dL (ref 0.4–1.0)
Albumin SerPl Elph-Mcnc: 3.5 g/dL (ref 2.9–4.4)
Albumin/Glob SerPl: 1.1 (ref 0.7–1.7)
B-Globulin SerPl Elph-Mcnc: 0.8 g/dL (ref 0.7–1.3)
Gamma Glob SerPl Elph-Mcnc: 1.2 g/dL (ref 0.4–1.8)
Globulin, Total: 3.2 g/dL (ref 2.2–3.9)
IGA: 318 mg/dL (ref 61–437)
IGG (IMMUNOGLOBIN G), SERUM: 1250 mg/dL (ref 700–1600)
IGM (IMMUNOGLOBULIN M), SRM: 193 mg/dL — AB (ref 15–143)
TOTAL PROTEIN ELP: 6.7 g/dL (ref 6.0–8.5)

## 2018-11-18 ENCOUNTER — Encounter (HOSPITAL_COMMUNITY): Payer: PPO | Admitting: Nurse Practitioner

## 2018-11-18 ENCOUNTER — Ambulatory Visit (HOSPITAL_COMMUNITY)
Admission: RE | Admit: 2018-11-18 | Discharge: 2018-11-18 | Disposition: A | Discharge status: Home / Self Care | Payer: PPO | Source: Ambulatory Visit | Attending: Nurse Practitioner | Admitting: Nurse Practitioner

## 2018-11-18 DIAGNOSIS — I4891 Unspecified atrial fibrillation: Secondary | ICD-10-CM | POA: Diagnosis not present

## 2018-11-18 LAB — BASIC METABOLIC PANEL
Anion gap: 12 (ref 5–15)
BUN: 27 mg/dL — ABNORMAL HIGH (ref 8–23)
CO2: 28 mmol/L (ref 22–32)
Calcium: 9.3 mg/dL (ref 8.9–10.3)
Chloride: 94 mmol/L — ABNORMAL LOW (ref 98–111)
Creatinine, Ser: 1.54 mg/dL — ABNORMAL HIGH (ref 0.61–1.24)
GFR calc Af Amer: 50 mL/min — ABNORMAL LOW (ref >60)
GFR calc non Af Amer: 43 mL/min — ABNORMAL LOW (ref >60)
Glucose, Bld: 107 mg/dL — ABNORMAL HIGH (ref 70–99)
Potassium: 5.1 mmol/L (ref 3.5–5.1)
Sodium: 134 mmol/L — ABNORMAL LOW (ref 135–145)

## 2018-11-18 LAB — MAGNESIUM: Magnesium: 2.7 mg/dL — ABNORMAL HIGH (ref 1.7–2.4)

## 2018-11-18 MED ORDER — DOFETILIDE 125 MCG PO CAPS: 125.0000 ug | ORAL_CAPSULE | Freq: Two times a day (BID) | ORAL | 2 refills | Status: DC

## 2018-11-18 NOTE — Progress Notes (Addendum)
Pt in for repeat EKG to check QT interval.  To be reviewed by Roderic Palau, NP  EKG today shows improved qtc at  465 ms on dofetilide 125 mcg bid, which he will continue. Bmet and mag drawn today.

## 2018-11-21 ENCOUNTER — Encounter (HOSPITAL_COMMUNITY): Payer: PPO

## 2018-11-25 ENCOUNTER — Other Ambulatory Visit (HOSPITAL_COMMUNITY): Payer: Self-pay

## 2018-11-25 MED ORDER — TORSEMIDE 20 MG PO TABS: 80.0000 mg | ORAL_TABLET | Freq: Two times a day (BID) | ORAL | 3 refills | Status: DC

## 2018-11-25 MED ORDER — POTASSIUM CHLORIDE CRYS ER 20 MEQ PO TBCR: 40.0000 meq | EXTENDED_RELEASE_TABLET | Freq: Two times a day (BID) | ORAL | 3 refills | Status: DC

## 2018-11-27 NOTE — Progress Notes (Signed)
Advanced Heart Failure Clinic Note  Cardiology: Dr. Johnsie Cancel HF Cardiology: Dr. Aundra Dubin  78 y.o. with history of CAD, persistent atrial fibrillation, and chronic diastolic CHF was referred by Dr. Johnsie Cancel for evaluation of CHF.  Patient had initial PCI to RCA in 11/1998.  This was complicated by stent thrombosis with inferior STEMI in 08/1999 requiring PCI.   In 2017, he was noted to be in atrial fibrillation persistently.  He appears to have been in atrial fibrillation since that time.  He decided not to undergo cardioversion.  Echo in 5/19 showed EF 50-55%, severe biatrial enlargement.   He was doing well until around 5/19. At that time, he noted his HR running higher and he developed peripheral edema.  He would "give out" much more easily.  No chest pain, just felt a severe lack of energy. He would get short of breath walking short distances.  He was started on torsemide.  He was admitted in 6/19 for diuresis (acute/chronic diastolic CHF).    He was started on Tikosyn and went back into NSR.   He presents today for 2 week follow up with his wife. Last visit torsemide was increased. His tikosyn dose was decreased due to long QTc >600 ms. He follows in afib clinic and QTc has improved to 465 ms on 2/10. Overall doing well. He was doing great until he injured his left foot. He stood up with no dizziness, but ankle popped and he fell. His son in law is an MD and advised him to put ice on it and elevated. Symptoms improving. He was able to walk up stairs with no SOB prior to injuring his ankle. No edema, orthopnea, or PND. Wearing CPAP qHS. No CP or dizziness. No bleeding on Xarelto. Canceled PYP scan because he had other medical issues going on. He is not sure he wants to get it done. Taking all medications. Down 6 lbs on our scale, back to baseline. Drinks around 2 L/day. Doesn't add salt, but eats out for half of meals. Avoids fast food and fried foods. Weight is down to 178 lbs for the last 2 weeks, which  is his dry weight.   Labs (7/19): K 4.2, creatinine 1.25 => 1.3, NT-proBNP 3260, hgb 16.3 Labs (8/19): K 3.9, creatinine 1.26, LDL 54 Labs (1/20): pro-BNP 1439, K 4.4, creatinine 1.3  PMH: 1. Atrial fibrillation: Now in NSR on Tikosyn.  2. HTN 3. OSA: Uses CPAP.  4. CAD: PCI to RCA in 11/1998.  - Stent thrombosis with inferior STEMI and PCI RCA in 08/1999.  5. Chronic diastolic CHF: Echo (6/37) with EF 50-55%, severe biatrial enlargement.  - 6/19 CHF exacerbation, hospitalized.    Review of systems complete and found to be negative unless listed in HPI.   Social History   Socioeconomic History  . Marital status: Married    Spouse name: scarlett  . Number of children: 3  . Years of education: 25  . Highest education level: Not on file  Occupational History  . Occupation: retired  Scientific laboratory technician  . Financial resource strain: Not on file  . Food insecurity:    Worry: Not on file    Inability: Not on file  . Transportation needs:    Medical: Not on file    Non-medical: Not on file  Tobacco Use  . Smoking status: Former Smoker    Types: Cigarettes    Last attempt to quit: 11/02/1999    Years since quitting: 19.0  . Smokeless tobacco: Never Used  Substance and Sexual Activity  . Alcohol use: No  . Drug use: No  . Sexual activity: Not on file  Lifestyle  . Physical activity:    Days per week: Not on file    Minutes per session: Not on file  . Stress: Not on file  Relationships  . Social connections:    Talks on phone: Not on file    Gets together: Not on file    Attends religious service: Not on file    Active member of club or organization: Not on file    Attends meetings of clubs or organizations: Not on file    Relationship status: Not on file  . Intimate partner violence:    Fear of current or ex partner: Not on file    Emotionally abused: Not on file    Physically abused: Not on file    Forced sexual activity: Not on file  Other Topics Concern  . Not on file    Social History Narrative  . Not on file   Family History  Problem Relation Age of Onset  . Heart Problems Mother   . Cancer Father     Current Outpatient Medications  Medication Sig Dispense Refill  . acetaminophen (TYLENOL) 500 MG tablet Take 500 mg by mouth every 6 (six) hours as needed.    . dofetilide (TIKOSYN) 125 MCG capsule Take 1 capsule (125 mcg total) by mouth 2 (two) times daily. 180 capsule 2  . loratadine (CLARITIN) 10 MG tablet Take 10 mg by mouth daily.    . metoprolol tartrate (LOPRESSOR) 50 MG tablet Take 1 tablet (50 mg total) by mouth 2 (two) times daily. 270 tablet 2  . nitroGLYCERIN (NITROSTAT) 0.4 MG SL tablet Place 1 tablet (0.4 mg total) under the tongue every 5 (five) minutes as needed for chest pain (3 doses max). 25 tablet 3  . potassium chloride (K-DUR,KLOR-CON) 10 MEQ tablet Take 40 mEq by mouth 2 (two) times daily.    . pravastatin (PRAVACHOL) 40 MG tablet TAKE 1 TABLET BY MOUTH EVERY DAY IN THE EVENING 90 tablet 3  . rivaroxaban (XARELTO) 20 MG TABS tablet Take 1 tablet (20 mg total) by mouth daily with supper. 30 tablet 0  . spironolactone (ALDACTONE) 25 MG tablet Take 0.5 tablets (12.5 mg total) by mouth daily. 45 tablet 3  . torsemide (DEMADEX) 20 MG tablet Take 4 tablets (80 mg total) by mouth 2 (two) times daily. 360 tablet 3   No current facility-administered medications for this encounter.    BP 96/68   Pulse 76   Wt 82.1 kg (181 lb)   SpO2 97%   BMI 24.55 kg/m   Wt Readings from Last 3 Encounters:  11/28/18 82.1 kg (181 lb)  11/13/18 85.2 kg (187 lb 12.8 oz)  09/17/18 87.6 kg (193 lb 3.2 oz)   General: No resp difficulty. HEENT: Normal Neck: Supple. JVP ~7. Carotids 2+ bilat; no bruits. No thyromegaly or nodule noted. Cor: PMI nondisplaced. RRR, No M/G/R noted Lungs: CTAB, normal effort. Abdomen: Soft, non-tender, non-distended, no HSM. No bruits or masses. +BS  Extremities: No cyanosis, clubbing, or rash. R and LLE no edema.   Neuro: Alert & orientedx3, cranial nerves grossly intact. moves all 4 extremities w/o difficulty. Affect pleasant   Assessment/Plan: 1. Chronic diastolic CHF: Echo in 7/67 with EF 50-55%, severe biatrial enlargement.   - NYHA II. Volume stable on exam. - Continue torsemide 80 mg bid. BMET today. Wife requests LFTs be checked as  well.  - With atrial fibrillation and prominent diastolic CHF, cardiac amyloidosis is a concern.  Myeloma panel is normal.  If creatinine remains stable, a cardiac MRI can also be done (creatinine has been borderline for gadolinium). He wants to think about PYP scan and will reschedule if he decides to go through with it. We discussed the purpose of the PYP scan and discussed cardiac amyloid today. I encouraged him to reschedule. 2. CAD: PCI to RCA, had stent thrombosis back in 2000.  No CP.  - No ASA given Xarelto use.  - Continue pravastatin, good lipids in 8/19.  3 Atrial fibrillation: Back in NSR on Tikosyn.   - Continue Xarelto. Denies bleeding.  - Continue decreased dose of Tikosyn 125 mcg bid (QTc prolonged to 630 ms last visit)  - Following in Afib clinic. QTc improved to 465 ms on lower dose, so will continue tikosyn at lower dose for now. Recheck mag today.   Volume much improved. Reviewed EKG from 2/10 - QTc improved on lower dose of tikosyn. Check CMET and mag today. Follow up with Dr Aundra Dubin in 2-3 months. Sooner with symptoms. He will reschedule PYP scan if he decides to get it.   Georgiana Shore, NP 11/28/2018  Greater than 50% of the 20 minute visit was spent in counseling/coordination of care regarding disease state education, salt/fluid restriction, sliding scale diuretics, and medication compliance.

## 2018-11-28 ENCOUNTER — Other Ambulatory Visit: Payer: Self-pay

## 2018-11-28 ENCOUNTER — Encounter (HOSPITAL_COMMUNITY): Payer: Self-pay

## 2018-11-28 ENCOUNTER — Ambulatory Visit (HOSPITAL_COMMUNITY)
Admission: RE | Admit: 2018-11-28 | Discharge: 2018-11-28 | Disposition: A | Discharge status: Home / Self Care | Payer: PPO | Source: Ambulatory Visit | Attending: Cardiology | Admitting: Cardiology

## 2018-11-28 VITALS — BP 96/68 | HR 76 | Wt 181.0 lb

## 2018-11-28 DIAGNOSIS — I5021 Acute systolic (congestive) heart failure: Secondary | ICD-10-CM

## 2018-11-28 DIAGNOSIS — I5022 Chronic systolic (congestive) heart failure: Secondary | ICD-10-CM

## 2018-11-28 DIAGNOSIS — I110 Hypertensive heart disease with heart failure: Secondary | ICD-10-CM | POA: Diagnosis not present

## 2018-11-28 DIAGNOSIS — I4819 Other persistent atrial fibrillation: Secondary | ICD-10-CM

## 2018-11-28 DIAGNOSIS — G4733 Obstructive sleep apnea (adult) (pediatric): Secondary | ICD-10-CM | POA: Diagnosis not present

## 2018-11-28 DIAGNOSIS — Z87891 Personal history of nicotine dependence: Secondary | ICD-10-CM | POA: Insufficient documentation

## 2018-11-28 DIAGNOSIS — Z7901 Long term (current) use of anticoagulants: Secondary | ICD-10-CM | POA: Insufficient documentation

## 2018-11-28 DIAGNOSIS — Z79899 Other long term (current) drug therapy: Secondary | ICD-10-CM | POA: Diagnosis not present

## 2018-11-28 DIAGNOSIS — I252 Old myocardial infarction: Secondary | ICD-10-CM | POA: Diagnosis not present

## 2018-11-28 DIAGNOSIS — I2510 Atherosclerotic heart disease of native coronary artery without angina pectoris: Secondary | ICD-10-CM | POA: Insufficient documentation

## 2018-11-28 DIAGNOSIS — Z955 Presence of coronary angioplasty implant and graft: Secondary | ICD-10-CM | POA: Diagnosis not present

## 2018-11-28 DIAGNOSIS — I5032 Chronic diastolic (congestive) heart failure: Secondary | ICD-10-CM

## 2018-11-28 DIAGNOSIS — I4891 Unspecified atrial fibrillation: Secondary | ICD-10-CM | POA: Insufficient documentation

## 2018-11-28 LAB — COMPREHENSIVE METABOLIC PANEL
ALT: 13 U/L (ref 0–44)
AST: 18 U/L (ref 15–41)
Albumin: 3.3 g/dL — ABNORMAL LOW (ref 3.5–5.0)
Alkaline Phosphatase: 112 U/L (ref 38–126)
Anion gap: 11 (ref 5–15)
BUN: 33 mg/dL — AB (ref 8–23)
CO2: 26 mmol/L (ref 22–32)
Calcium: 9.5 mg/dL (ref 8.9–10.3)
Chloride: 97 mmol/L — ABNORMAL LOW (ref 98–111)
Creatinine, Ser: 1.51 mg/dL — ABNORMAL HIGH (ref 0.61–1.24)
GFR calc Af Amer: 52 mL/min — ABNORMAL LOW (ref >60)
GFR calc non Af Amer: 45 mL/min — ABNORMAL LOW (ref >60)
Glucose, Bld: 85 mg/dL (ref 70–99)
Potassium: 4.3 mmol/L (ref 3.5–5.1)
Sodium: 134 mmol/L — ABNORMAL LOW (ref 135–145)
Total Bilirubin: 1.9 mg/dL — ABNORMAL HIGH (ref 0.3–1.2)
Total Protein: 7.4 g/dL (ref 6.5–8.1)

## 2018-11-28 LAB — MAGNESIUM: Magnesium: 2.7 mg/dL — ABNORMAL HIGH (ref 1.7–2.4)

## 2018-11-28 NOTE — Patient Instructions (Addendum)
Lab work done today. We will notify you of any abnormal lab work.  Follow up with Dr. Aundra Dubin in 3 months

## 2018-12-09 ENCOUNTER — Other Ambulatory Visit (HOSPITAL_COMMUNITY): Payer: Self-pay | Admitting: *Deleted

## 2018-12-09 MED ORDER — DOFETILIDE 125 MCG PO CAPS: 125.0000 ug | ORAL_CAPSULE | Freq: Two times a day (BID) | ORAL | 2 refills | Status: DC

## 2018-12-16 ENCOUNTER — Other Ambulatory Visit (HOSPITAL_COMMUNITY): Payer: Self-pay | Admitting: *Deleted

## 2018-12-16 MED ORDER — DOFETILIDE 125 MCG PO CAPS: 125.0000 ug | ORAL_CAPSULE | Freq: Two times a day (BID) | ORAL | 2 refills | Status: DC

## 2018-12-26 ENCOUNTER — Telehealth: Payer: Self-pay | Admitting: Cardiovascular Disease

## 2018-12-26 NOTE — Telephone Encounter (Signed)
Will forward to Dr. Aundra Dubin at Dr. Kyla Balzarine request.

## 2018-12-26 NOTE — Telephone Encounter (Signed)
Patient complaining of weakness and low BP. Patient stated he has the weakness for several weeks, but decided to check his BP the last two days. Patient's lowest reading was 83/63, this am. Patient stated he held his metoprolol 50 mg  yesterday morning, but took his night dose last night, at that time his BP was 109/76. Patient continues to take his Torsemide 80 mg BID, Aldactone 12.5 mg daily, and Tikosyn 125 mcg BID. Patient has been seen most recently by Dr. Aundra Dubin at the HF clinic. Will forward to Dr. Johnsie Cancel for advisement.

## 2018-12-26 NOTE — Telephone Encounter (Signed)
Called and spoke w/pt, he denies fever/URI symptoms and does not want to come into our office.  He states BP has been running low causing him to feel more weak than usual, HR running 70s and wt is stable at 180-181 lbs.    Discussed w/Dr Aundra Dubin, he would like pt to hold Spiro and Metoprolol for now, continue to monitor BP and call us early next week with update, pt is aware and agreeable.

## 2018-12-26 NOTE — Telephone Encounter (Signed)
Larry [name], check with Mr Schellhorn.  If he is not febrile/URI symptoms, probably needs to be seen, maybe tomorrow.

## 2018-12-26 NOTE — Telephone Encounter (Signed)
Pt c/o BP issue: STAT if pt c/o blurred vision, one-sided weakness or slurred speech  1. What are your last 5 BP readings? 93/70 20 mins ago      88/69      83/63 when he woke up this morning      109/76 last night      104/70 8pm last night   2. Are you having any other symptoms (ex. Dizziness, headache, blurred vision, passed out)? Just weakness.   3. What is your BP issue? Low BP.  He did take BP medication last night.  He did not this morning since it was low.

## 2018-12-30 NOTE — Telephone Encounter (Signed)
Patient called with the following b/p reading over the weekend 103/68 HR 89 89/64 HR 114 102/77 HR 90 99/79 HR 113 102/77 98/79 HR 109   Denies weakness, dizziness, CP or SOB Weight stable at 180-183    Please advise further if needed

## 2018-12-30 NOTE — Telephone Encounter (Signed)
Spoke w/pt, he is aware and agreeable, will continue current meds for now.

## 2018-12-30 NOTE — Telephone Encounter (Signed)
These readings are ok, no changes.

## 2019-01-02 ENCOUNTER — Encounter (HOSPITAL_COMMUNITY): Payer: Self-pay | Admitting: *Deleted

## 2019-01-02 NOTE — Telephone Encounter (Signed)
Pt is sch for telehealth visit with Dr Aundra Dubin 3/27

## 2019-01-03 ENCOUNTER — Other Ambulatory Visit: Payer: Self-pay

## 2019-01-03 ENCOUNTER — Ambulatory Visit (HOSPITAL_COMMUNITY)
Admission: RE | Admit: 2019-01-03 | Discharge: 2019-01-03 | Disposition: A | Discharge status: Home / Self Care | Payer: PPO | Source: Ambulatory Visit | Attending: Cardiology | Admitting: Cardiology

## 2019-01-03 DIAGNOSIS — I5032 Chronic diastolic (congestive) heart failure: Secondary | ICD-10-CM

## 2019-01-03 DIAGNOSIS — I5022 Chronic systolic (congestive) heart failure: Secondary | ICD-10-CM

## 2019-01-03 DIAGNOSIS — I480 Paroxysmal atrial fibrillation: Secondary | ICD-10-CM

## 2019-01-03 MED ORDER — METOPROLOL TARTRATE 25 MG PO TABS: 25.0000 mg | ORAL_TABLET | Freq: Two times a day (BID) | ORAL | 11 refills | Status: DC

## 2019-01-03 NOTE — Patient Instructions (Addendum)
Your provider requests you have a PYP scan in 6 weeks. (we will contact you to schedule appt)  Follow up in 2 months. 02/13/2019 at 11:00am Gate code: [id number]  Restart Metoprolol 25mg  twice daiy.

## 2019-01-03 NOTE — Progress Notes (Signed)
Pt aware of plan. AVS mailed to patient.

## 2019-01-05 NOTE — Progress Notes (Signed)
Heart Failure TeleHealth Note  Due to national recommendations of social distancing due to [place] 19, Audio telehealth visit is felt to be most appropriate for this patient at this time.  See MyChart message from today for patient consent regarding telehealth for [facility].  Date:  01/05/2019   ID:  Larry [name], DOB 07-Mar-1941, MRN [id number]  Location: Home  Provider location: [address], [place] Type of Visit: Established patient, etc  PCP:  Patient, No Pcp Per  Cardiologist:  Jenkins Rouge, MD Primary HF: Dr. Aundra Dubin  Chief Complaint:  Shortness of breath   History of Present Illness: Larry [name] is a 78 y.o. male who presents via audio/video conferencing for a telehealth visit today.     Today,he denies symptoms of cough, fevers, chills, or new SOB worrisome for COVID 19.    Patient has a history of CAD, persistent atrial fibrillation, and chronic diastolic CHF.  Patient had initial PCI to RCA in 11/1998.  This was complicated by stent thrombosis with inferior STEMI in 08/1999 requiring PCI.   In 2017, he was noted to be in atrial fibrillation persistently.  He decided not to undergo cardioversion.  Echo in 5/19 showed EF 50-55%, severe biatrial enlargement.   He was doing well until around 5/19. At that time, he noted his HR running higher and he developed peripheral edema.  He would "give out" much more easily.  No chest pain, just felt a severe lack of energy. He would get short of breath walking short distances.  He was started on torsemide.  He was admitted in 6/19 for diuresis (acute/chronic diastolic CHF).    He was started on Tikosyn and went back into NSR. At a prior appointment with me, he was noted to be in atrial fibrillation with prolonged QT interval.  I discussed with Dr. Rayann Heman and decreased Tikosyn to 125 mcg bid.  By 11/15/18, he was back in NSR, QTc ok.  In early 3/20, he noted low BP and lightheadedness.  I stopped his metoprolol and  spironolactone.  BP is better now, SBP generally in 100s with no lightheadedness.  However, HR is up into the 90s. He has stable dyspnea walking 200 feet.  No pitting edema.  No orthopnea/PND.  No chest pain.  He does not feel palpitations. He is using CPAP at night.   Labs (7/19): K 4.2, creatinine 1.25 => 1.3, NT-proBNP 3260, hgb 16.3 Labs (8/19): K 3.9, creatinine 1.26, LDL 54 Labs (1/20): pro-BNP 1439, K 4.4, creatinine 1.3 Labs (2/20): K 4.3, creatinine 1.5, myeloma panel negative   PMH: 1. Atrial fibrillation: Now on Tikosyn.  2. HTN 3. OSA: Uses CPAP.  4. CAD: PCI to RCA in 11/1998.  - Stent thrombosis with inferior STEMI and PCI RCA in 08/1999.  5. Chronic diastolic CHF: Echo (5/72) with EF 50-55%, severe biatrial enlargement.  - 6/19 CHF exacerbation, hospitalized.   6. CKD: Stage 3.   Past Surgical History:  Procedure Laterality Date  . CARDIAC CATHETERIZATION  12/15/1999   EF was 55%   . CORONARY ANGIOPLASTY WITH STENT PLACEMENT  08/15/1999   CAD, status post prior stenting of the mid to distal RCA in 11/1998, with an acute diaphragmatic wall infarction due to total occlusion at the stent site/There is also 70% narrowing in the diagonal branch of the LAD/The LV showed inferior wall hypo- akinesis.-- Successful reperfusion, percutaneous transluminal coronary percutaneous transluminal coronary & placement of a 2nd overlying stent in RCA  Current Outpatient Medications  Medication Sig Dispense Refill  . acetaminophen (TYLENOL) 500 MG tablet Take 500 mg by mouth every 6 (six) hours as needed.    . dofetilide (TIKOSYN) 125 MCG capsule Take 1 capsule (125 mcg total) by mouth 2 (two) times daily. 60 capsule 2  . loratadine (CLARITIN) 10 MG tablet Take 10 mg by mouth daily.    . metoprolol tartrate (LOPRESSOR) 25 MG tablet Take 1 tablet (25 mg total) by mouth 2 (two) times daily. 60 tablet 11  . nitroGLYCERIN (NITROSTAT) 0.4 MG SL tablet Place 1 tablet (0.4 mg total) under the  tongue every 5 (five) minutes as needed for chest pain (3 doses max). 25 tablet 3  . potassium chloride (K-DUR,KLOR-CON) 10 MEQ tablet Take 40 mEq by mouth 2 (two) times daily.    . pravastatin (PRAVACHOL) 40 MG tablet TAKE 1 TABLET BY MOUTH EVERY DAY IN THE EVENING 90 tablet 3  . rivaroxaban (XARELTO) 20 MG TABS tablet Take 1 tablet (20 mg total) by mouth daily with supper. 30 tablet 0  . torsemide (DEMADEX) 20 MG tablet Take 4 tablets (80 mg total) by mouth 2 (two) times daily. 360 tablet 3   No current facility-administered medications for this encounter.     Allergies:   Plavix [clopidogrel bisulfate]   Social History:  The patient  reports that he quit smoking about 19 years ago. His smoking use included cigarettes. He has never used smokeless tobacco. He reports that he does not drink alcohol or use drugs.   Family History:  The patient's family history includes Cancer in his father; Heart Problems in his mother.   ROS:  Please see the history of present illness.   All other systems are personally reviewed and negative.   Exam:  (Tele Health Call; Exam is subjective and or/visual.) BP 104/72  HR 90 (vitals done by patient on home monitor) General:   No resp difficulty. Lungs: Normal respiratory effort with conversation.  Abdomen: Non-distended. Pt denies tenderness with self palpation.  Extremities: Pt denies edema. Neuro: Alert & oriented x 3.   Recent Labs: 03/22/2018: B Natriuretic Peptide 468.2 10/28/2018: NT-Pro BNP 1,539 11/13/2018: Hemoglobin 10.9; Platelets 275 11/28/2018: ALT 13; BUN 33; Creatinine, Ser 1.51; Magnesium 2.7; Potassium 4.3; Sodium 134  Personally reviewed   Wt Readings from Last 3 Encounters:  11/28/18 82.1 kg (181 lb)  11/13/18 85.2 kg (187 lb 12.8 oz)  09/17/18 87.6 kg (193 lb 3.2 oz)     ASSESSMENT AND PLAN:  1. Chronic diastolic CHF: Echo in 4/12 with EF 50-55%, severe biatrial enlargement.  NYHA class II-III symptoms chronically.  Weight stable on  current torsemide dose and he denies edema.  - Continue torsemide 80 mg bid.  - With atrial fibrillation and prominent diastolic CHF, cardiac amyloidosis is a concern.  Myeloma panel was negative. I recommended a PYP scan again and we discussed cardiac amyloidosis.  He is willing to have this done when COVID 19 restrictions are lifted. If creatinine remains stable and PYP is abnormal, a cardiac MRI can also be done (creatinine has been borderline for gadolinium).  2. CAD: PCI to RCA, had stent thrombosis back in 2000.  No CP.  - No ASA given Xarelto use.  - Continue pravastatin, good lipids in 8/19.  3. Atrial fibrillation: HR in 90s today.  Not sure if HR is higher because he is back in atrial fibrillation or because he is off metoprolol with low BP.  He remains on Tikosyn  125 mcg bid.  - Continue current Tikosyn, recent QTc on ECG was ok.  Will need ECG to determine +/- atrial fibrillation at next visit.    - Continue Xarelto. Denies bleeding.  - As he has had breakthrough atrial fibrillation with Tikosyn use, need to consider atrial fibrillation ablation => discuss with EP.  - Restart metoprolol but at a lower dose, 25 mg bid.  He will let us know if he feels lightheaded or BP is dropping low.   COVID screen The patient does not have any symptoms that suggest any further testing/ screening at this time.  Social distancing reinforced today.  Recommended follow-up:  2 months  Relevant cardiac medications were reviewed at length with the patient today.   The patient does not have concerns regarding their medications at this time.   Patient Risk: After full review of this patients clinical status, I feel that they are at moderate risk for cardiac decompensation at this time.  Today, I have spent 21 minutes with the patient with telehealth technology discussing CHF.    Signed, [name], MD  01/05/2019   [facility] [address] [facility] [place] [address] [phone] (office) [phone] (fax)

## 2019-01-09 ENCOUNTER — Other Ambulatory Visit (HOSPITAL_COMMUNITY): Payer: PPO

## 2019-01-16 ENCOUNTER — Ambulatory Visit: Payer: PPO | Admitting: Cardiovascular Disease

## 2019-01-24 DIAGNOSIS — Z0000 Encounter for general adult medical examination without abnormal findings: Secondary | ICD-10-CM | POA: Diagnosis not present

## 2019-01-27 ENCOUNTER — Telehealth (HOSPITAL_COMMUNITY): Payer: Self-pay | Admitting: *Deleted

## 2019-01-27 ENCOUNTER — Ambulatory Visit: Payer: PPO | Admitting: Cardiovascular Disease

## 2019-01-27 DIAGNOSIS — I5022 Chronic systolic (congestive) heart failure: Secondary | ICD-10-CM

## 2019-01-27 NOTE — Telephone Encounter (Signed)
Pt called c/o weight gain over the past two weeks. Pts weight this morning was 191lbs pts normal weight is 185lbs. Pt states his feet and ankles are a little swollen he is using compression stockings but they arent helping. No SOB admits to eating more because of stay at home order. Message routed to [place] for advice.

## 2019-01-29 NOTE — Telephone Encounter (Signed)
Increase torsemide to 100 mg bid x 2 days then back to 80 mg bid.  Needs BMET 1 week.  Telehealth followup.

## 2019-01-30 NOTE — Telephone Encounter (Signed)
PT AWARE OF INSTRUCTIONS PT REPORTS WHILE WAITING ON REPLY FROM MD HE TOOK 12.5 OF SPIRO X 2 DAYS. ADVISED TO FOLLOW INSTRUCTIONS GIVEN BY DR MCLEAN, REPEAT LABS X 1 WEEK AND FOLLOW UP WITH VIRTUAL VISIT NEXT WEEK FOR FURTHER ASSESSMENT

## 2019-01-30 NOTE — Telephone Encounter (Signed)
Called pt was given busy tone. Called patient again no answer.

## 2019-02-05 ENCOUNTER — Ambulatory Visit (HOSPITAL_COMMUNITY)
Admission: RE | Admit: 2019-02-05 | Discharge: 2019-02-05 | Disposition: A | Discharge status: Home / Self Care | Payer: PPO | Source: Ambulatory Visit | Attending: Cardiology | Admitting: Cardiology

## 2019-02-05 ENCOUNTER — Other Ambulatory Visit: Payer: Self-pay

## 2019-02-05 DIAGNOSIS — I5022 Chronic systolic (congestive) heart failure: Secondary | ICD-10-CM

## 2019-02-05 LAB — BASIC METABOLIC PANEL
Anion gap: 13 (ref 5–15)
BUN: 25 mg/dL — ABNORMAL HIGH (ref 8–23)
CO2: 24 mmol/L (ref 22–32)
Calcium: 8.9 mg/dL (ref 8.9–10.3)
Chloride: 97 mmol/L — ABNORMAL LOW (ref 98–111)
Creatinine, Ser: 1.42 mg/dL — ABNORMAL HIGH (ref 0.61–1.24)
GFR calc Af Amer: 55 mL/min — ABNORMAL LOW (ref >60)
GFR calc non Af Amer: 48 mL/min — ABNORMAL LOW (ref >60)
Glucose, Bld: 124 mg/dL — ABNORMAL HIGH (ref 70–99)
Potassium: 3.7 mmol/L (ref 3.5–5.1)
Sodium: 134 mmol/L — ABNORMAL LOW (ref 135–145)

## 2019-02-06 NOTE — Progress Notes (Signed)
Heart Failure TeleHealth Note  Due to national recommendations of social distancing due to [place] 19, telehealth visit is felt to be most appropriate for this patient at this time.  I discussed the limitations, risks, security and privacy concerns of performing an evaluation and management service by telephone and the availability of in person appointments. I also discussed with the patient that there may be a patient responsible charge related to this service. The patient expressed understanding and agreed to proceed.   ID:  Larry [name], DOB 02/05/1941, MRN [id number]  Location: Home  Provider location: [address] [place] Type of Visit: Established patient   PCP:  Patient, No Pcp Per  Cardiologist:  Jenkins Rouge, MD Primary HF: Dr Aundra Dubin  Chief Complaint: Weight gain   History of Present Illness: Larry [name] is a 78 y.o. male with a history of CAD, persistent afib on tikosyn, CAD with multiple PCI, chronic diastolic HF, OSA on CPAP  Patient presents via audio/video conferencing for a telehealth visit today. He had a virtual visit 1 month ago and HR was elevated 90s, so metoprolol was resumed. He called last week with weight gain and edema and was told to increase torsemide for a few days. Labs yesterday showed creatinine 1.42, K 3.7. Doing okay. No SOB. Still has BLE edema in his legs. Torsemide didn't seem to help much. He took spiro 12.5 for 2 days and it did seem to help. Wearing compression hose every day. No orthopnea or PND. Rare dry cough. No fever/chills. No CP, palpitations, or dizziness. Appetite is good. Does not feel like he's out of rhythm. Feels weak with low BP. HR improved with metoprolol. Weight is down from 193 to 189 lbs. Taking all medications. Limits fluid and salt intake. Wearing CPAP qHS.      BP: 90s/60s HR: 80s Weight: 189 lbs  Pt denies symptoms of cough, fevers, chills, or new SOB worrisome for COVID 19.    Past Medical History:   Diagnosis Date  . Coronary artery disease    status post remote PCI of the mid to distal RCA 11/1998 followed by acute ST elevation MI inferiorly 08/1999 with occlusion of the prior RCA stent as well as 70% diagonal branch.  He underwent PCI of the RCA  . Hypercholesterolemia   . Hypertension   . Myocardial infarct ([place])   . Olecranon bursitis    Past Surgical History:  Procedure Laterality Date  . CARDIAC CATHETERIZATION  12/15/1999   EF was 55%   . CORONARY ANGIOPLASTY WITH STENT PLACEMENT  08/15/1999   CAD, status post prior stenting of the mid to distal RCA in 11/1998, with an acute diaphragmatic wall infarction due to total occlusion at the stent site/There is also 70% narrowing in the diagonal branch of the LAD/The LV showed inferior wall hypo- akinesis.-- Successful reperfusion, percutaneous transluminal coronary percutaneous transluminal coronary & placement of a 2nd overlying stent in RCA     Current Outpatient Medications  Medication Sig Dispense Refill  . dofetilide (TIKOSYN) 125 MCG capsule Take 1 capsule (125 mcg total) by mouth 2 (two) times daily. 60 capsule 2  . loratadine (CLARITIN) 10 MG tablet Take 10 mg by mouth daily.    . metoprolol tartrate (LOPRESSOR) 25 MG tablet Take 1 tablet (25 mg total) by mouth 2 (two) times daily. 60 tablet 11  . potassium chloride (K-DUR,KLOR-CON) 10 MEQ tablet Take 40 mEq by mouth 2 (two) times daily.    . pravastatin (PRAVACHOL)  40 MG tablet TAKE 1 TABLET BY MOUTH EVERY DAY IN THE EVENING 90 tablet 3  . rivaroxaban (XARELTO) 20 MG TABS tablet Take 1 tablet (20 mg total) by mouth daily with supper. 30 tablet 0  . torsemide (DEMADEX) 20 MG tablet Take 4 tablets (80 mg total) by mouth 2 (two) times daily. 360 tablet 3  . acetaminophen (TYLENOL) 500 MG tablet Take 500 mg by mouth every 6 (six) hours as needed.    . nitroGLYCERIN (NITROSTAT) 0.4 MG SL tablet Place 1 tablet (0.4 mg total) under the tongue every 5 (five) minutes as needed for  chest pain (3 doses max). (Patient not taking: Reported on 02/07/2019) 25 tablet 3   No current facility-administered medications for this encounter.     Allergies:   Plavix [clopidogrel bisulfate]   Social History:  The patient  reports that he quit smoking about 19 years ago. His smoking use included cigarettes. He has never used smokeless tobacco. He reports that he does not drink alcohol or use drugs.   Family History:  The patient's family history includes Cancer in his father; Heart Problems in his mother.   ROS:  Please see the history of present illness.   All other systems are personally reviewed and negative.    Exam:  (Video/Tele Health Call; Exam is subjective and or/visual.) General:  Speaks in full sentences. No resp difficulty. Lungs: Normal respiratory effort with conversation.  Abdomen: No distension per patient report Extremities: BLE 1+ edema 1/2 way to knee Neuro: Alert & oriented x 3.   Recent Labs: 03/22/2018: B Natriuretic Peptide 468.2 10/28/2018: NT-Pro BNP 1,539 11/13/2018: Hemoglobin 10.9; Platelets 275 11/28/2018: ALT 13; Magnesium 2.7 02/05/2019: BUN 25; Creatinine, Ser 1.42; Potassium 3.7; Sodium 134  Personally reviewed   Wt Readings from Last 3 Encounters:  11/28/18 82.1 kg (181 lb)  11/13/18 85.2 kg (187 lb 12.8 oz)  09/17/18 87.6 kg (193 lb 3.2 oz)      ASSESSMENT AND PLAN:  1. Chronic diastolic CHF: Echo in 7/16 with EF 50-55%, severe biatrial enlargement. - NYHA class II-III  - Volume mildly elevated. -Continuetorsemide 80 mg bid.  - Resume spiro 12.5 mg qHS. BMET in 10 days.  - With atrial fibrillation and prominent diastolic CHF, cardiac amyloidosis is a concern.Myeloma panelwas negative. Dr Aundra Dubin recommended a PYP scan again and we discussed cardiac amyloidosis.  He is willing to have this done when COVID 19 restrictions are lifted.If creatinine remains stable and PYP is abnormal, a cardiac MRI can also be done (creatinine has been  borderline for gadolinium).  2. CAD: PCI to RCA, had stent thrombosis back in 2000.No s/s ischemia. - No ASA given Xarelto use.  - Continue pravastatin, good lipids in 8/19.   3. Atrial fibrillation:  - Continue current Tikosyn, recent QTc on ECG was ok.   - Continue Xarelto.Denies bleeding. -As he has had breakthrough atrial fibrillation with Tikosyn use, need to consider atrial fibrillation ablation => discuss with EP.  - Continue metoprolol 25 mg BID.    COVID screen The patient does not have any symptoms that suggest any further testing/ screening at this time.  Social distancing reinforced today.  Patient Risk: After full review of this patients clinical status, I feel that they are at moderate risk for cardiac decompensation at this time.  Orders/Follow up: Resume spiro 12.5 mg qHS. He will let us know if BP drops or he feels dizzy/lightheaded. BMET in 10 days in HF clinic. Follow up in 2-3  months.   Today, I have spent 15 minutes with the patient with telehealth technology discussing the above issues.    Signed, Georgiana Shore, NP  02/07/2019 1:18 PM   [facility] [address] [facility] [place] [address] [phone] (office) [phone] (fax)

## 2019-02-07 ENCOUNTER — Encounter (HOSPITAL_COMMUNITY): Payer: Self-pay

## 2019-02-07 ENCOUNTER — Ambulatory Visit (HOSPITAL_COMMUNITY)
Admission: RE | Admit: 2019-02-07 | Discharge: 2019-02-07 | Disposition: A | Discharge status: Home / Self Care | Payer: PPO | Source: Ambulatory Visit | Attending: Internal Medicine | Admitting: Internal Medicine

## 2019-02-07 ENCOUNTER — Other Ambulatory Visit: Payer: Self-pay

## 2019-02-07 DIAGNOSIS — I25118 Atherosclerotic heart disease of native coronary artery with other forms of angina pectoris: Secondary | ICD-10-CM

## 2019-02-07 DIAGNOSIS — I5022 Chronic systolic (congestive) heart failure: Secondary | ICD-10-CM

## 2019-02-07 DIAGNOSIS — I5032 Chronic diastolic (congestive) heart failure: Secondary | ICD-10-CM

## 2019-02-07 DIAGNOSIS — I480 Paroxysmal atrial fibrillation: Secondary | ICD-10-CM

## 2019-02-07 MED ORDER — SPIRONOLACTONE 25 MG PO TABS: 12.5000 mg | ORAL_TABLET | Freq: Every day | ORAL | 3 refills | Status: DC

## 2019-02-07 NOTE — Patient Instructions (Signed)
Lab work will need to be done in 10 days. This is scheduled for May 13th at 11:30am.   RESUME Spironolactone 12.5mg  (0.5 tab) every evening.  Follow up with [place] in 2-3 months. This is scheduled for August 3rd at 1:30pm.

## 2019-02-07 NOTE — Addendum Note (Signed)
Encounter addended by: Marlise Eves, RN on: 02/07/2019 1:47 PM  Actions taken: Pharmacy for encounter modified, Visit diagnoses modified, Order list changed, Diagnosis association updated, Clinical Note Signed

## 2019-02-07 NOTE — Progress Notes (Signed)
Spoke to pt to review after visit summary instructions. Pt verbalized understanding. No further questions at this time. avs sent to [facility]

## 2019-02-10 ENCOUNTER — Ambulatory Visit: Payer: PPO | Admitting: Cardiovascular Disease

## 2019-02-13 ENCOUNTER — Encounter (HOSPITAL_COMMUNITY): Payer: PPO | Admitting: Cardiology

## 2019-02-17 ENCOUNTER — Telehealth (HOSPITAL_COMMUNITY): Payer: Self-pay | Admitting: Cardiology

## 2019-02-17 NOTE — Telephone Encounter (Signed)
Patient called to report he no longer needs labs as ordered at last visit. Reports he was given spiro for BLE edema. Reports instead patient decreased po fluid intake including soft drinks and reports swelling has gone down tremendously. He felt he no longer even needed the spiro that was prescribed   Advised to return call to office is BLE edema returned or if he felt he would need to start spiro in the future, patient voiced understanding   Medication list updated and lab appt cancelled

## 2019-02-19 ENCOUNTER — Other Ambulatory Visit (HOSPITAL_COMMUNITY): Payer: PPO

## 2019-02-19 ENCOUNTER — Telehealth (HOSPITAL_COMMUNITY): Payer: Self-pay | Admitting: Radiology

## 2019-02-19 NOTE — Telephone Encounter (Signed)
Spoke with patient to rescheduled echocardiogram. Patient states he is not real comfortable coming back into the office at this time. I stated we understand and we will call back in a month and re-evaluate.

## 2019-02-27 ENCOUNTER — Encounter: Payer: Self-pay | Admitting: Cardiovascular Disease

## 2019-03-05 IMAGING — US US ABDOMEN COMPLETE
1 series · 13 of 25 positions shown · non-contrast
Comparison: None.

CLINICAL DATA: Elevated LFTs

EXAM:
ABDOMEN ULTRASOUND COMPLETE

[Series 1: us abdomen complete · 0.25mm/px · 13 of 90 slices shown]
[im 1/90]
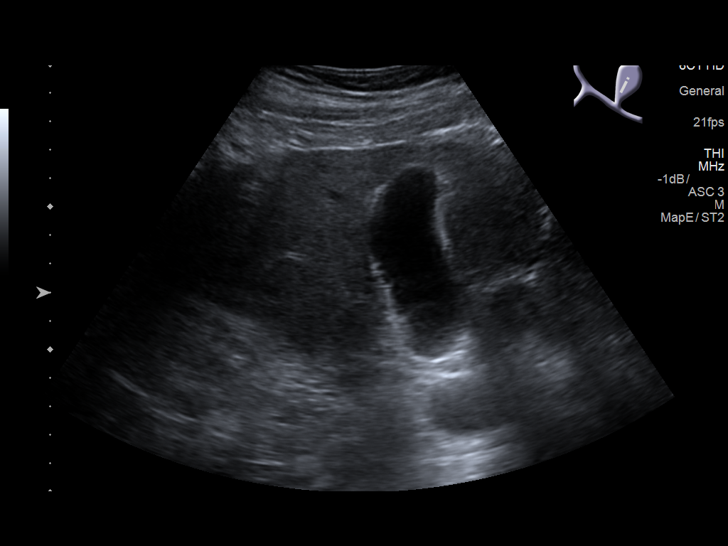
[im 8/90]
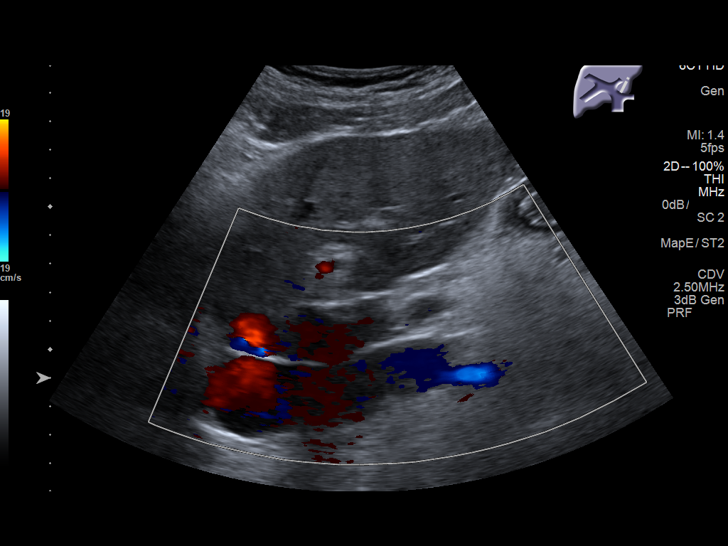
[im 15/90]
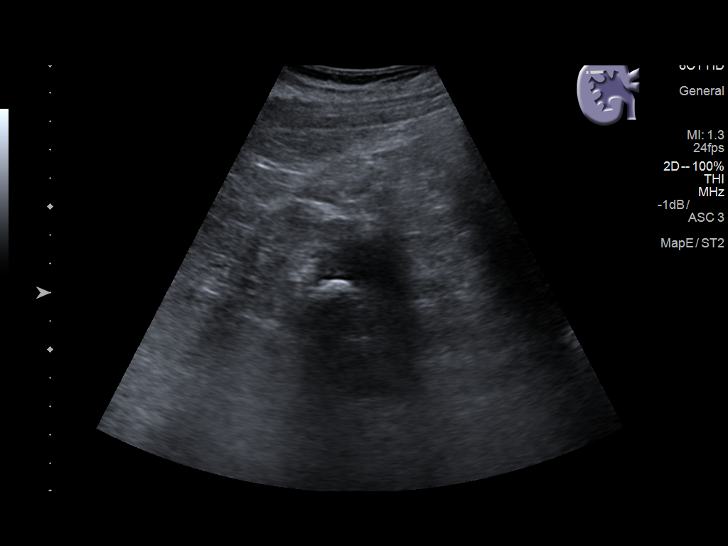
[im 23/90]
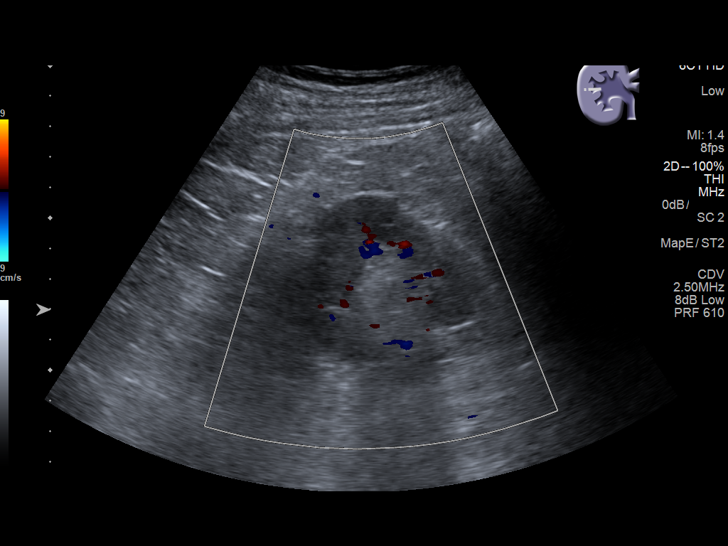
[im 30/90]
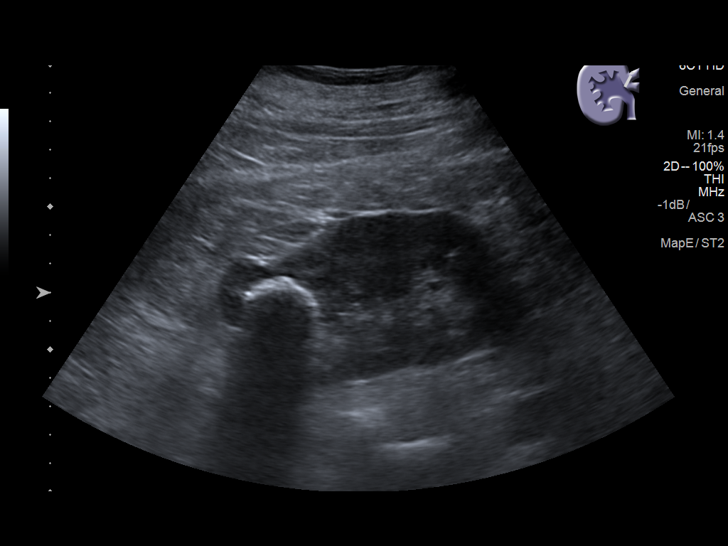
[im 38/90]
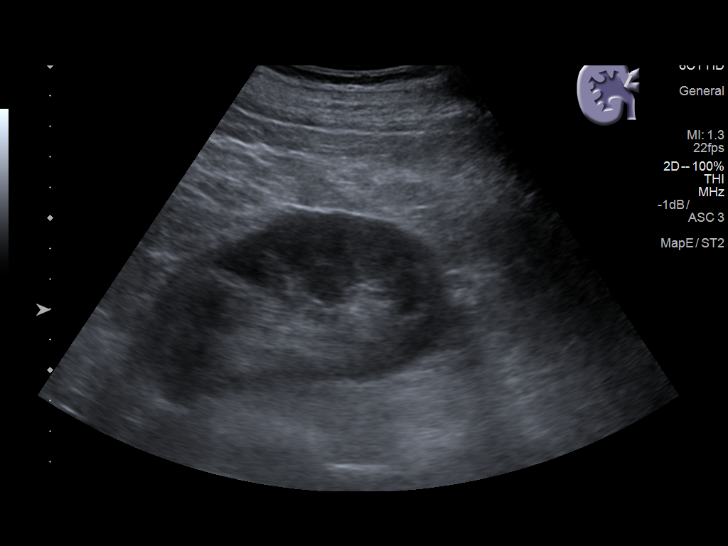
[im 45/90]
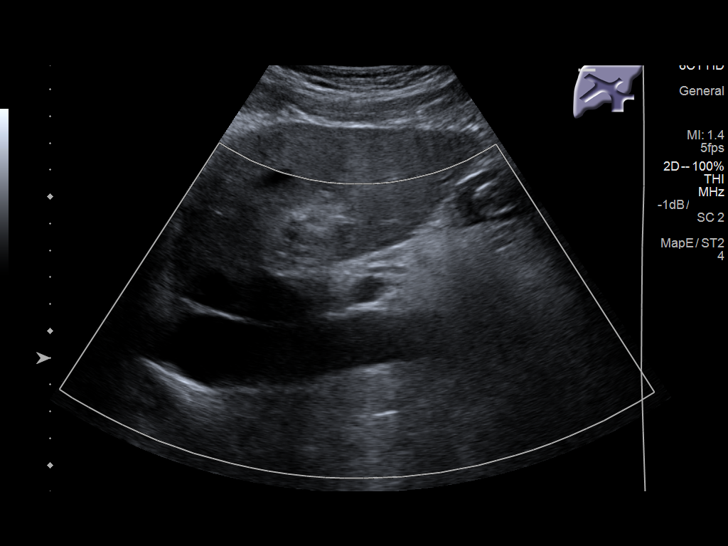
[im 52/90]
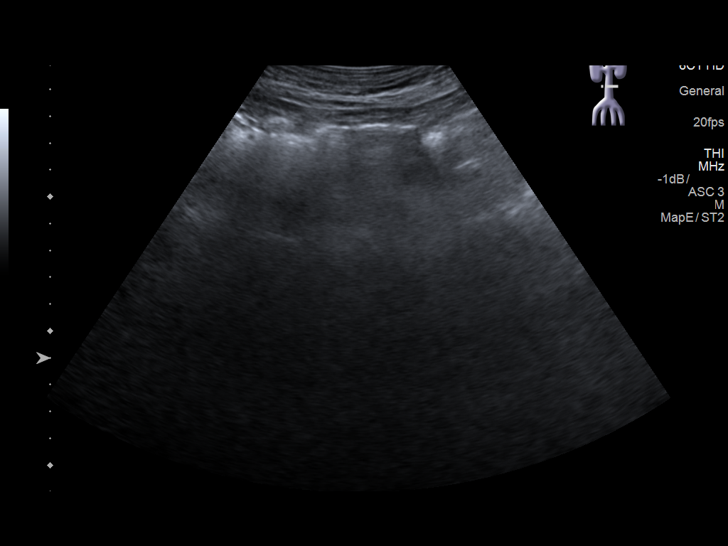
[im 60/90]
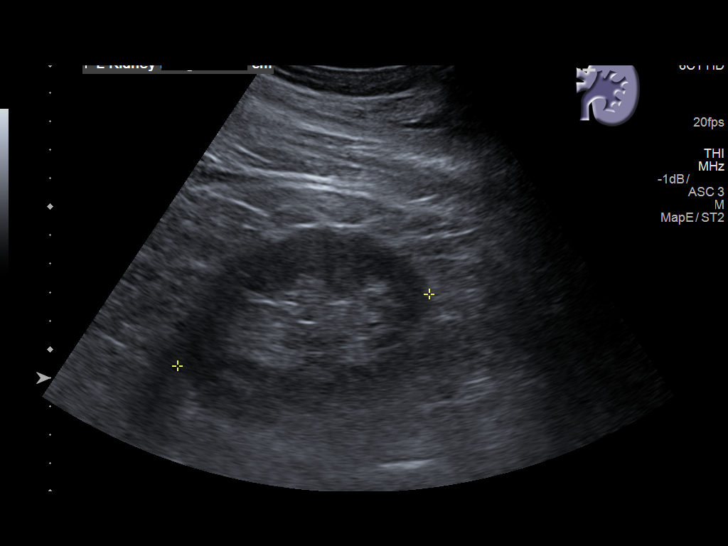
[im 67/90]
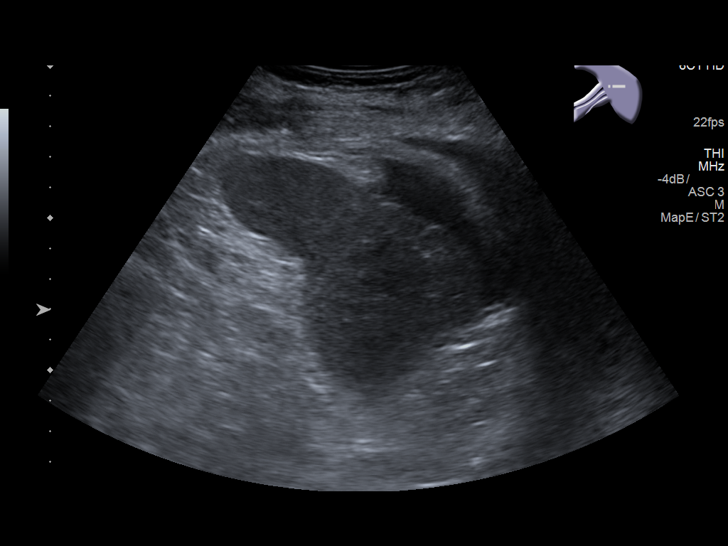
[im 75/90]
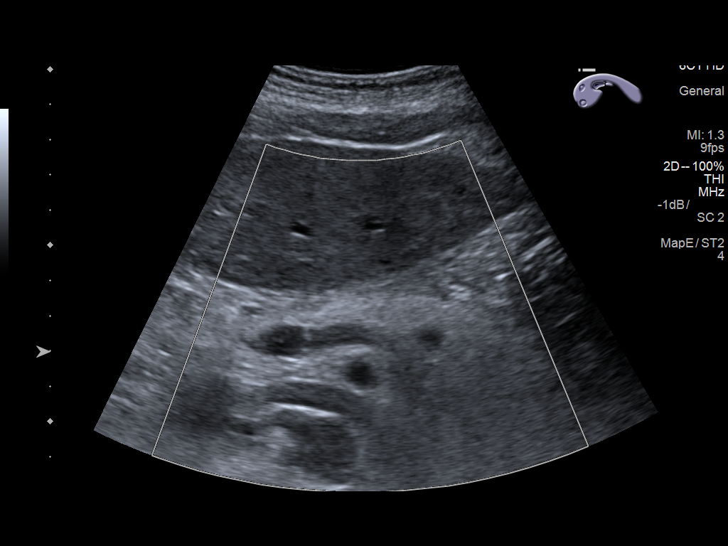
[im 82/90]
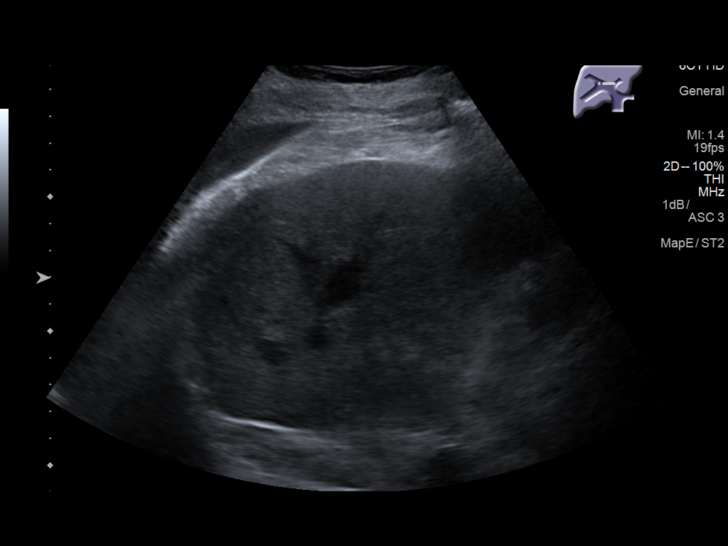
[im 90/90]
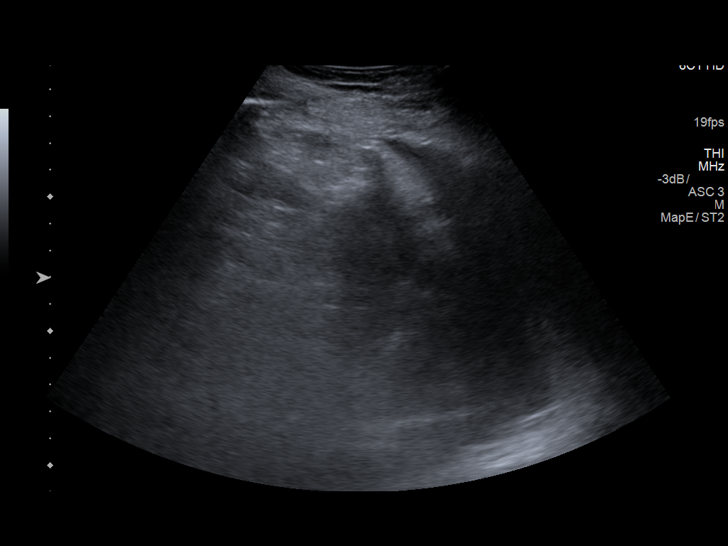

[13 of 25 positions shown; findings below may reference images not displayed]

FINDINGS: Gallbladder: No gallstones or wall thickening visualized. No
sonographic Murphy sign noted by sonographer.

Common bile duct: Diameter: Normal caliber, 3 mm

Liver: Increased echotexture compatible with fatty infiltration. No
focal abnormality or biliary ductal dilatation. Portal vein is
patent on color Doppler imaging with normal direction of blood flow
towards the liver.

IVC: No abnormality visualized.

Pancreas: Visualized portion unremarkable.

Spleen: Size and appearance within normal limits.

Right Kidney: Length: 11.2 cm. Shadowing calcification in the upper
pole measures 2.6 cm. This could be large shadowing stone or
calcified renal lesion. No hydronephrosis.

Left Kidney: Length: 9.2 cm. Echogenicity within normal limits. No
mass or hydronephrosis visualized.

Abdominal aorta: No aneurysm visualized.

Other findings: Bilateral pleural effusions noted. Small amount of
ascites adjacent to the spleen.
IMPRESSION: Fatty infiltration of the liver.

Bilateral effusions and trace perisplenic ascites.

2.6 cm calcified structure in the right upper kidney. It is
difficult to determine if this is a calcified renal lesion or large
nonobstructing stone. This could be further characterized with
elective CT with and without contrast.

## 2019-04-09 ENCOUNTER — Telehealth (HOSPITAL_COMMUNITY): Payer: Self-pay | Admitting: Cardiology

## 2019-04-09 MED ORDER — SPIRONOLACTONE 25 MG PO TABS: 12.5000 mg | ORAL_TABLET | Freq: Every day | ORAL | 3 refills | Status: DC

## 2019-04-09 NOTE — Telephone Encounter (Signed)
Patient called back to report he restarted spiro x 3 days for BLE  Reports swelling has gone down and feels a lot better. Advised to be sure to restart medication as previously ordered by provider 12.5 mg q HS  medication list updated

## 2019-05-05 ENCOUNTER — Other Ambulatory Visit: Payer: Self-pay | Admitting: Cardiovascular Disease

## 2019-05-05 DIAGNOSIS — I4892 Unspecified atrial flutter: Secondary | ICD-10-CM

## 2019-05-05 NOTE — Telephone Encounter (Signed)
Pt last saw Dr Haroldine Laws 02/07/19 virtual visit Covid-19, last labs 02/05/19 Creat 1.42, age 78, weight 82.1kg, CrCl 51.39, based on CRCl pt is on appropriate dosage of Xarelto 20mg  QD.  Will refill rx.

## 2019-05-06 ENCOUNTER — Telehealth: Payer: Self-pay | Admitting: Cardiovascular Disease

## 2019-05-06 NOTE — Telephone Encounter (Signed)
Pt called back and I gave him Wynetta Emery and [employer] phone number to call to see if he is eligable to apply. He and his wife states that they denied him last year but does not know why. I have advised him that if he is not eligible to apply for asst with Wynetta Emery and [employer] he will need to switch to Coumadin. They both verbalized understanding.

## 2019-05-06 NOTE — Telephone Encounter (Signed)
Follow up: ° ° ° °Patient returning your call back.  °

## 2019-05-06 NOTE — Telephone Encounter (Addendum)
No answer and the pt has a VM that has not been set up so I could not leave a message,  He should contact Wynetta Emery and [employer] pt asst program [phone]) to ask questions about applying and to request they mail him an application.

## 2019-05-06 NOTE — Telephone Encounter (Signed)
New message:    patient calling stating he needs some help with getting his Xarelto. Please call patient.

## 2019-05-06 NOTE — Telephone Encounter (Signed)
Pt is calling to inform Dr. Johnsie Cancel that he went to refill his Xarelto for its regular one month supply, and the price went from $45 a month to now $130 for a month supply. Pt states that he was told he is in the doughnut hole. Pt would like to see if there is any assistance he can apply for to get this medication back to its original cost, or if that can't happen, advise on a different regimen.  Informed the pt that I will route this communication to our Prior Auth Nurse Jeani Hawking, for further review, and to see if she could assist with getting this medication reduced in cost.   Informed the pt that I will also make Dr Johnsie Cancel aware of this as well.  Informed the pt that he will hear back from our Prior Auth Nurse, once she determines if he is eligible for assistance or getting this med reduced in cost. Pt states he only has 4 days worth of this medication available.  Pt verbalized understanding and agrees with this plan.

## 2019-05-12 ENCOUNTER — Other Ambulatory Visit: Payer: Self-pay

## 2019-05-12 ENCOUNTER — Encounter (HOSPITAL_COMMUNITY): Payer: Self-pay

## 2019-05-12 ENCOUNTER — Ambulatory Visit (HOSPITAL_COMMUNITY)
Admission: RE | Admit: 2019-05-12 | Discharge: 2019-05-12 | Disposition: A | Discharge status: Home / Self Care | Payer: PPO | Source: Ambulatory Visit | Attending: Cardiology | Admitting: Cardiology

## 2019-05-12 ENCOUNTER — Other Ambulatory Visit (HOSPITAL_COMMUNITY): Payer: Self-pay

## 2019-05-12 ENCOUNTER — Telehealth (HOSPITAL_COMMUNITY): Payer: Self-pay

## 2019-05-12 VITALS — BP 108/64 | HR 100 | Wt 186.4 lb

## 2019-05-12 DIAGNOSIS — Z87891 Personal history of nicotine dependence: Secondary | ICD-10-CM | POA: Insufficient documentation

## 2019-05-12 DIAGNOSIS — I4891 Unspecified atrial fibrillation: Secondary | ICD-10-CM | POA: Diagnosis not present

## 2019-05-12 DIAGNOSIS — I5022 Chronic systolic (congestive) heart failure: Secondary | ICD-10-CM

## 2019-05-12 DIAGNOSIS — I2510 Atherosclerotic heart disease of native coronary artery without angina pectoris: Secondary | ICD-10-CM | POA: Diagnosis not present

## 2019-05-12 DIAGNOSIS — Z955 Presence of coronary angioplasty implant and graft: Secondary | ICD-10-CM | POA: Diagnosis not present

## 2019-05-12 DIAGNOSIS — Z8249 Family history of ischemic heart disease and other diseases of the circulatory system: Secondary | ICD-10-CM | POA: Diagnosis not present

## 2019-05-12 DIAGNOSIS — Z7901 Long term (current) use of anticoagulants: Secondary | ICD-10-CM | POA: Diagnosis not present

## 2019-05-12 DIAGNOSIS — I110 Hypertensive heart disease with heart failure: Secondary | ICD-10-CM | POA: Diagnosis not present

## 2019-05-12 DIAGNOSIS — I25118 Atherosclerotic heart disease of native coronary artery with other forms of angina pectoris: Secondary | ICD-10-CM

## 2019-05-12 DIAGNOSIS — I480 Paroxysmal atrial fibrillation: Secondary | ICD-10-CM | POA: Diagnosis not present

## 2019-05-12 DIAGNOSIS — R5383 Other fatigue: Secondary | ICD-10-CM

## 2019-05-12 DIAGNOSIS — G4733 Obstructive sleep apnea (adult) (pediatric): Secondary | ICD-10-CM | POA: Insufficient documentation

## 2019-05-12 DIAGNOSIS — Z79899 Other long term (current) drug therapy: Secondary | ICD-10-CM | POA: Diagnosis not present

## 2019-05-12 DIAGNOSIS — I5032 Chronic diastolic (congestive) heart failure: Secondary | ICD-10-CM | POA: Insufficient documentation

## 2019-05-12 DIAGNOSIS — I252 Old myocardial infarction: Secondary | ICD-10-CM | POA: Diagnosis not present

## 2019-05-12 LAB — BRAIN NATRIURETIC PEPTIDE: B Natriuretic Peptide: 469.5 pg/mL — ABNORMAL HIGH (ref 0.0–100.0)

## 2019-05-12 LAB — BASIC METABOLIC PANEL
Anion gap: 12 (ref 5–15)
BUN: 16 mg/dL (ref 8–23)
CO2: 23 mmol/L (ref 22–32)
Calcium: 8.5 mg/dL — ABNORMAL LOW (ref 8.9–10.3)
Chloride: 95 mmol/L — ABNORMAL LOW (ref 98–111)
Creatinine, Ser: 1.5 mg/dL — ABNORMAL HIGH (ref 0.61–1.24)
GFR calc Af Amer: 52 mL/min — ABNORMAL LOW (ref >60)
GFR calc non Af Amer: 45 mL/min — ABNORMAL LOW (ref >60)
Glucose, Bld: 161 mg/dL — ABNORMAL HIGH (ref 70–99)
Potassium: 3.6 mmol/L (ref 3.5–5.1)
Sodium: 130 mmol/L — ABNORMAL LOW (ref 135–145)

## 2019-05-12 LAB — CBC
HCT: 27.5 % — ABNORMAL LOW (ref 39.0–52.0)
Hemoglobin: 7.6 g/dL — ABNORMAL LOW (ref 13.0–17.0)
MCH: 20.7 pg — ABNORMAL LOW (ref 26.0–34.0)
MCHC: 27.6 g/dL — ABNORMAL LOW (ref 30.0–36.0)
MCV: 74.7 fL — ABNORMAL LOW (ref 80.0–100.0)
Platelets: 317 10*3/uL (ref 150–400)
RBC: 3.68 MIL/uL — ABNORMAL LOW (ref 4.22–5.81)
RDW: 20.4 % — ABNORMAL HIGH (ref 11.5–15.5)
WBC: 7.7 10*3/uL (ref 4.0–10.5)
nRBC: 0 % (ref 0.0–0.2)

## 2019-05-12 LAB — TSH: TSH: 2.746 u[IU]/mL (ref 0.350–4.500)

## 2019-05-12 MED ORDER — SODIUM CHLORIDE 0.9 % IV SOLN: 510.0000 mg | INTRAVENOUS | Status: DC

## 2019-05-12 NOTE — Progress Notes (Signed)
Scheduled pt for an feraheme infusion for the first available. Pt agreeable. Pt daughter had further questions about why the gi consult is necessary and if he needed to come to the appointment in person. I explained why the Np and MD chose the consult and that he may not be able to make his gi consult a virtual appointment depending on what type of examination that they want to do. She stated that with covid 38 and her father being at risk she would prefer to have a virtual appointment. I encouraged her to speak with the scheduling department when they call to schedule his consult. The daughter also had concerns that the consult may not be necessary if they give this iron infusion and his hemoglobin returns to normal. I explained that this may be a temporary fix without finding the cause of his hemoglobin being low in the first place. She verbalized understanding but wanted to reassess after follow up lab work after the iron infusions. She also stated that he has not seen any blood in his stool. I explained that depending on where a gi bleed is you may or may not be able to recognize blood in the stool. Daughter seemed reluctant but willing to advance with current plan.

## 2019-05-12 NOTE — Telephone Encounter (Signed)
-----   Message from Conrad , NP sent at 05/12/2019  1:09 PM EDT ----- Please call him. Hemoglobin down to 7.6 . Needs referral to GI ASAP. Set him up for dose of feraheme. Discussed with Dr Aundra Dubin.

## 2019-05-12 NOTE — Telephone Encounter (Signed)
Called pt to review lab work. Pt and daughter agreeable to plan. Referral placed and meds ordered.

## 2019-05-12 NOTE — Progress Notes (Signed)
[facility] Note  Cardiology: Dr. Johnsie Cancel HF Cardiology: Dr. Aundra Dubin  78 y.o. with history of CAD, persistent atrial fibrillation, and chronic diastolic CHF was referred by Dr. Johnsie Cancel for evaluation of CHF.  Patient had initial PCI to RCA in 11/1998.  This was complicated by stent thrombosis with inferior STEMI in 08/1999 requiring PCI.   In 2017, he was noted to be in atrial fibrillation persistently.  He appears to have been in atrial fibrillation since that time.  He decided not to undergo cardioversion.  Echo in 5/19 showed EF 50-55%, severe biatrial enlargement.   He was doing well until around 5/19. At that time, he noted his HR running higher and he developed peripheral edema.  He would "give out" much more easily.  No chest pain, just felt a severe lack of energy. He would get short of breath walking short distances.  He was started on torsemide.  He was admitted in 6/19 for diuresis (acute/chronic diastolic CHF).    He was started on Tikosyn and went back into NSR.   Today he retusn for HF follow up. In May he was started on spironolactone but stopped about 3 weeks later because he felt terrible. He recently restarted 12.5 mg spironolactone and has lost about 10 pounds.  Overall feeling ok. Complaining of fatigue and mild sob with exertion. Had a fall last week. Says he was going to the back room and fell backwards.  Denies PND/Orthopnea. No bleeding issues.  Appetite ok. No fever or chills. Weight at home has been trending down from 194-->184  pounds. Taking all medications.   Labs (7/19): K 4.2, creatinine 1.25 => 1.3, NT-proBNP 3260, hgb 16.3 Labs (8/19): K 3.9, creatinine 1.26, LDL 54 Labs (1/20): pro-BNP 1439, K 4.4, creatinine 1.3 Labs 02/05/19: K 3.7 Creatinine 1.47   PMH: 1. Atrial fibrillation: Now in NSR on Tikosyn.  2. HTN 3. OSA: Uses CPAP.  4. CAD: PCI to RCA in 11/1998.  - Stent thrombosis with inferior STEMI and PCI RCA in 08/1999.  5. Chronic diastolic  CHF: Echo (8/67) with EF 50-55%, severe biatrial enlargement.  - 6/19 CHF exacerbation, hospitalized.    Review of systems complete and found to be negative unless listed in HPI.   Social History   Socioeconomic History  . Marital status: Married    Spouse name: scarlett  . Number of children: 3  . Years of education: 23  . Highest education level: Not on file  Occupational History  . Occupation: retired  Scientific laboratory technician  . Financial resource strain: Not on file  . Food insecurity    Worry: Not on file    Inability: Not on file  . Transportation needs    Medical: Not on file    Non-medical: Not on file  Tobacco Use  . Smoking status: Former Smoker    Types: Cigarettes    Quit date: 11/02/1999    Years since quitting: 19.5  . Smokeless tobacco: Never Used  Substance and Sexual Activity  . Alcohol use: No  . Drug use: No  . Sexual activity: Not on file  Lifestyle  . Physical activity    Days per week: Not on file    Minutes per session: Not on file  . Stress: Not on file  Relationships  . Social Herbalist on phone: Not on file    Gets together: Not on file    Attends religious service: Not on file    Active member  of club or organization: Not on file    Attends meetings of clubs or organizations: Not on file    Relationship status: Not on file  . Intimate partner violence    Fear of current or ex partner: Not on file    Emotionally abused: Not on file    Physically abused: Not on file    Forced sexual activity: Not on file  Other Topics Concern  . Not on file  Social History Narrative  . Not on file   Family History  Problem Relation Age of Onset  . Heart Problems Mother   . Cancer Father     Current Outpatient Medications  Medication Sig Dispense Refill  . acetaminophen (TYLENOL) 500 MG tablet Take 500 mg by mouth every 6 (six) hours as needed.    . dofetilide (TIKOSYN) 125 MCG capsule Take 1 capsule (125 mcg total) by mouth 2 (two) times daily.  60 capsule 2  . loratadine (CLARITIN) 10 MG tablet Take 10 mg by mouth daily.    . metoprolol tartrate (LOPRESSOR) 25 MG tablet Take 1 tablet (25 mg total) by mouth 2 (two) times daily. 60 tablet 11  . potassium chloride (K-DUR,KLOR-CON) 10 MEQ tablet Take 40 mEq by mouth 2 (two) times daily.    . pravastatin (PRAVACHOL) 40 MG tablet TAKE 1 TABLET BY MOUTH EVERY DAY IN THE EVENING 90 tablet 3  . spironolactone (ALDACTONE) 25 MG tablet Take 0.5 tablets (12.5 mg total) by mouth daily. 90 tablet 3  . torsemide (DEMADEX) 20 MG tablet Take 4 tablets (80 mg total) by mouth 2 (two) times daily. 360 tablet 3  . XARELTO 20 MG TABS tablet TAKE ONE TABLET (20 MG TOTAL) BY MOUTH DAILY WITH SUPPER 30 tablet 5  . nitroGLYCERIN (NITROSTAT) 0.4 MG SL tablet Place 1 tablet (0.4 mg total) under the tongue every 5 (five) minutes as needed for chest pain (3 doses max). (Patient not taking: Reported on 02/07/2019) 25 tablet 3   No current facility-administered medications for this encounter.    BP 108/64   Pulse 100   Wt 84.6 kg (186 lb 6.4 oz)   SpO2 100%   BMI 25.28 kg/m   Wt Readings from Last 3 Encounters:  05/12/19 84.6 kg (186 lb 6.4 oz)  11/28/18 82.1 kg (181 lb)  11/13/18 85.2 kg (187 lb 12.8 oz)   General:  Well appearing. No resp difficulty HEENT: normal Neck: supple. no JVD. Carotids 2+ bilat; no bruits. No lymphadenopathy or thryomegaly appreciated. Cor: PMI nondisplaced. Regular rate & rhythm. No rubs, gallops or murmurs. Lungs: clear Abdomen: soft, nontender, nondistended. No hepatosplenomegaly. No bruits or masses. Good bowel sounds. Extremities: no cyanosis, clubbing, rash, R and LLE 1+ edema Neuro: alert & orientedx3, cranial nerves grossly intact. moves all 4 extremities w/o difficulty. Affect pleasant  EKG: SR low voltage qrs, 1st degree heart block, QT/QTc 394/462 ms    Assessment/Plan: 1. Chronic diastolic CHF: Echo in 2/42 with EF 50-55%, severe biatrial enlargement.    With  atrial fibrillation and prominent diastolic CHF, cardiac amyloidosis is a concern.  Myeloma panel is normal.  If creatinine remains stable, a cardiac MRI can also be done (creatinine has been borderline for gadolinium). We discussed PYP scan again but he said that does not want to pursue due to COVID. I discussed the precautions we are taking to provide testing. He does not want to purse.  - NYHA III. Volume status ok. - Continue torsemide 80 mg bid + 12.5  mg spiro daily.  - Continue ted hose.  2. CAD: PCI to RCA, had stent thrombosis back in 2000.  No CP.  - No ASA given Xarelto use.  - Continue pravastatin, good lipids in 8/19.  3 Atrial fibrillation:  - Maintaining NSR.    - Continue Xarelto. Denies bleeding.  - Continue decreased dose of Tikosyn 125 mcg bid (QTc prolonged to 630 ms last visit)  - Following in Afib clinic. QTc improved to 462 ms on lower dose, so will continue tikosyn at lower dose for now. Recheck mag today.  4. Fatigue  Check CBC, TSH today   Follow up in 4 weeks with Dr Aundra Dubin.  Darrick Grinder, NP 05/12/2019

## 2019-05-12 NOTE — Patient Instructions (Signed)
Labs done today. We will call you ONLY if labs are ABNORMAL.  Your physician recommends that you schedule a follow-up appointment in: 1 month with Dr. Aundra Dubin.   At the [facility], you and your health needs are our priority. As part of our continuing mission to provide you with exceptional heart care, we have created designated Provider Care Teams. These Care Teams include your primary Cardiologist (physician) and Advanced Practice Providers (APPs- Physician Assistants and Nurse Practitioners) who all work together to provide you with the care you need, when you need it.   You may see any of the following providers on your designated Care Team at your next follow up: Marland Kitchen Dr Glori Bickers . Dr [name] . Darrick Grinder, NP   Please be sure to bring in all your medications bottles to every appointment.

## 2019-05-20 ENCOUNTER — Encounter (HOSPITAL_COMMUNITY): Payer: PPO

## 2019-06-25 ENCOUNTER — Other Ambulatory Visit: Payer: Self-pay

## 2019-06-25 ENCOUNTER — Encounter (HOSPITAL_COMMUNITY): Payer: Self-pay | Admitting: Cardiology

## 2019-06-25 ENCOUNTER — Ambulatory Visit (HOSPITAL_COMMUNITY)
Admission: RE | Admit: 2019-06-25 | Discharge: 2019-06-25 | Disposition: A | Discharge status: Home / Self Care | Payer: PPO | Source: Ambulatory Visit | Attending: Cardiology | Admitting: Cardiology

## 2019-06-25 VITALS — BP 110/84 | HR 107 | Wt 176.6 lb

## 2019-06-25 DIAGNOSIS — Z79899 Other long term (current) drug therapy: Secondary | ICD-10-CM | POA: Insufficient documentation

## 2019-06-25 DIAGNOSIS — N183 Chronic kidney disease, stage 3 (moderate): Secondary | ICD-10-CM | POA: Diagnosis not present

## 2019-06-25 DIAGNOSIS — I2510 Atherosclerotic heart disease of native coronary artery without angina pectoris: Secondary | ICD-10-CM | POA: Insufficient documentation

## 2019-06-25 DIAGNOSIS — I480 Paroxysmal atrial fibrillation: Secondary | ICD-10-CM

## 2019-06-25 DIAGNOSIS — Z8249 Family history of ischemic heart disease and other diseases of the circulatory system: Secondary | ICD-10-CM | POA: Diagnosis not present

## 2019-06-25 DIAGNOSIS — I5032 Chronic diastolic (congestive) heart failure: Secondary | ICD-10-CM | POA: Insufficient documentation

## 2019-06-25 DIAGNOSIS — D509 Iron deficiency anemia, unspecified: Secondary | ICD-10-CM | POA: Diagnosis not present

## 2019-06-25 DIAGNOSIS — I252 Old myocardial infarction: Secondary | ICD-10-CM | POA: Diagnosis not present

## 2019-06-25 DIAGNOSIS — I25118 Atherosclerotic heart disease of native coronary artery with other forms of angina pectoris: Secondary | ICD-10-CM | POA: Diagnosis not present

## 2019-06-25 DIAGNOSIS — Z955 Presence of coronary angioplasty implant and graft: Secondary | ICD-10-CM | POA: Diagnosis not present

## 2019-06-25 DIAGNOSIS — Z7901 Long term (current) use of anticoagulants: Secondary | ICD-10-CM | POA: Insufficient documentation

## 2019-06-25 DIAGNOSIS — G4733 Obstructive sleep apnea (adult) (pediatric): Secondary | ICD-10-CM | POA: Insufficient documentation

## 2019-06-25 DIAGNOSIS — E7800 Pure hypercholesterolemia, unspecified: Secondary | ICD-10-CM

## 2019-06-25 DIAGNOSIS — Z87891 Personal history of nicotine dependence: Secondary | ICD-10-CM | POA: Insufficient documentation

## 2019-06-25 DIAGNOSIS — I130 Hypertensive heart and chronic kidney disease with heart failure and stage 1 through stage 4 chronic kidney disease, or unspecified chronic kidney disease: Secondary | ICD-10-CM | POA: Diagnosis not present

## 2019-06-25 LAB — CBC
HCT: 37.5 % — ABNORMAL LOW (ref 39.0–52.0)
Hemoglobin: 11.3 g/dL — ABNORMAL LOW (ref 13.0–17.0)
MCH: 29.1 pg (ref 26.0–34.0)
MCHC: 30.1 g/dL (ref 30.0–36.0)
MCV: 96.6 fL (ref 80.0–100.0)
Platelets: 263 10*3/uL (ref 150–400)
RBC: 3.88 MIL/uL — ABNORMAL LOW (ref 4.22–5.81)
RDW: 25.5 % — ABNORMAL HIGH (ref 11.5–15.5)
WBC: 8.5 10*3/uL (ref 4.0–10.5)
nRBC: 0 % (ref 0.0–0.2)

## 2019-06-25 LAB — BASIC METABOLIC PANEL
Anion gap: 13 (ref 5–15)
BUN: 23 mg/dL (ref 8–23)
CO2: 24 mmol/L (ref 22–32)
Calcium: 9.1 mg/dL (ref 8.9–10.3)
Chloride: 96 mmol/L — ABNORMAL LOW (ref 98–111)
Creatinine, Ser: 1.51 mg/dL — ABNORMAL HIGH (ref 0.61–1.24)
GFR calc Af Amer: 51 mL/min — ABNORMAL LOW (ref >60)
GFR calc non Af Amer: 44 mL/min — ABNORMAL LOW (ref >60)
Glucose, Bld: 106 mg/dL — ABNORMAL HIGH (ref 70–99)
Potassium: 4.1 mmol/L (ref 3.5–5.1)
Sodium: 133 mmol/L — ABNORMAL LOW (ref 135–145)

## 2019-06-25 LAB — LIPID PANEL
Cholesterol: 93 mg/dL (ref 0–200)
HDL: 28 mg/dL — ABNORMAL LOW (ref >40)
LDL Cholesterol: 57 mg/dL (ref 0–99)
Total CHOL/HDL Ratio: 3.3 RATIO
Triglycerides: 40 mg/dL (ref <150)
VLDL: 8 mg/dL (ref 0–40)

## 2019-06-25 MED ORDER — TORSEMIDE 20 MG PO TABS: 60.0000 mg | ORAL_TABLET | Freq: Two times a day (BID) | ORAL | 3 refills | Status: DC

## 2019-06-25 MED ORDER — POTASSIUM CHLORIDE CRYS ER 20 MEQ PO TBCR: EXTENDED_RELEASE_TABLET | ORAL | 3 refills | Status: DC

## 2019-06-25 MED ORDER — METOPROLOL SUCCINATE ER 25 MG PO TB24: 25.0000 mg | ORAL_TABLET | Freq: Every day | ORAL | 11 refills | Status: DC

## 2019-06-25 NOTE — Progress Notes (Signed)
Medication Samples have been provided to the patient.  Drug name: Xarelto       Strength: 20mg         Qty: 28 LOT: [id number]  Exp.Date: 5/22  Dosing instructions: 1 tab daily at supper  The patient has been instructed regarding the correct time, dose, and frequency of taking this medication, including desired effects and most common side effects.   Aidaly Cordner 11:00 AM 06/25/2019

## 2019-06-25 NOTE — Progress Notes (Signed)
Date:  06/25/2019   ID:  [name], DOB January 31, 1941, MRN [id number]  Provider location: [address], [place] Type of Visit: Established patient  PCP:  Delphina Cahill  Cardiologist:  Jenkins Rouge, MD Primary HF: Dr. Aundra Dubin   History of Present Illness: Larry [name] is a 78 y.o. male who has a history of CAD, persistent atrial fibrillation, and chronic diastolic CHF.  Patient had initial PCI to RCA in 11/1998.  This was complicated by stent thrombosis with inferior STEMI in 08/1999 requiring PCI.   In 2017, he was noted to be in atrial fibrillation persistently.  He decided not to undergo cardioversion.  Echo in 5/19 showed EF 50-55%, severe biatrial enlargement.   He was doing well until around 5/19. At that time, he noted his HR running higher and he developed peripheral edema.  He would "give out" much more easily.  No chest pain, just felt a severe lack of energy. He would get short of breath walking short distances.  He was started on torsemide.  He was admitted in 6/19 for diuresis (acute/chronic diastolic CHF).    He was started on Tikosyn and went back into NSR. At a prior appointment with me, he was noted to be in atrial fibrillation with prolonged QT interval.  I discussed with Dr. Rayann Heman and decreased Tikosyn to 125 mcg bid.  By 11/15/18, he was back in NSR, QTc ok.  In early 3/20, he noted low BP and lightheadedness.  I stopped his metoprolol and spironolactone.    Recently, he was seen by the NP in this clinic with complaint of worsening dyspnea.  CBC was checked, hgb was 7.6.  Torsemide was also increased due to concern for volume overload.  He has not had melena or BRBPR.  GI referral was recommended, he has never had a colonoscopy.  However, he declined this.  He thinks that his blood loss came from extensive bruising from a fall that he had had.  He was started on oral iron.    Today, he feels much better.  Hgb is up to 11.3.  Still no BRBPR/melena.  No chest pain.   He is much less short of breath, able to walk 1/2 mile without dyspnea.  No orthopnea/PND.  He has decreased torsemide on his own to 40 mg bid.  Weight is down 10 lbs.   ECG (personally reviewed): NSR, 1st degree AV block, old ASMI, LPFB, QTc 500 msec  Labs (7/19): K 4.2, creatinine 1.25 => 1.3, NT-proBNP 3260, hgb 16.3 Labs (8/19): K 3.9, creatinine 1.26, LDL 54 Labs (1/20): pro-BNP 1439, K 4.4, creatinine 1.3 Labs (2/20): K 4.3, creatinine 1.5, myeloma panel negative  Labs (8/20): K 3.6, creatinine 1.5, hgb 7.6, TSH normal, BNP 470 Labs (9/20): hgb 11.3  PMH: 1. Atrial fibrillation: Now on Tikosyn.  2. HTN 3. OSA: Uses CPAP.  4. CAD: PCI to RCA in 11/1998.  - Stent thrombosis with inferior STEMI and PCI RCA in 08/1999.  5. Chronic diastolic CHF: Echo (0/86) with EF 50-55%, severe biatrial enlargement.  - 6/19 CHF exacerbation, hospitalized.   6. CKD: Stage 3.   Past Surgical History:  Procedure Laterality Date  . CARDIAC CATHETERIZATION  12/15/1999   EF was 55%   . CORONARY ANGIOPLASTY WITH STENT PLACEMENT  08/15/1999   CAD, status post prior stenting of the mid to distal RCA in 11/1998, with an acute diaphragmatic wall infarction due to total occlusion at the stent site/There is also 70%  narrowing in the diagonal branch of the LAD/The LV showed inferior wall hypo- akinesis.-- Successful reperfusion, percutaneous transluminal coronary percutaneous transluminal coronary & placement of a 2nd overlying stent in RCA     Current Outpatient Medications  Medication Sig Dispense Refill  . acetaminophen (TYLENOL) 500 MG tablet Take 500 mg by mouth every 6 (six) hours as needed.    . dofetilide (TIKOSYN) 125 MCG capsule Take 1 capsule (125 mcg total) by mouth 2 (two) times daily. 60 capsule 2  . Ferrous Sulfate (IRON PO) Take 25 mg by mouth daily.    Marland Kitchen loratadine (CLARITIN) 10 MG tablet Take 10 mg by mouth daily.    . nitroGLYCERIN (NITROSTAT) 0.4 MG SL tablet Place 1 tablet (0.4 mg  total) under the tongue every 5 (five) minutes as needed for chest pain (3 doses max). 25 tablet 3  . potassium chloride (K-DUR) 20 MEQ tablet Take 3 tablets (60 mEq total) by mouth every morning AND 2 tablets (40 mEq total) every evening. 150 tablet 3  . pravastatin (PRAVACHOL) 40 MG tablet TAKE 1 TABLET BY MOUTH EVERY DAY IN THE EVENING 90 tablet 3  . spironolactone (ALDACTONE) 25 MG tablet Take 0.5 tablets (12.5 mg total) by mouth daily. 90 tablet 3  . torsemide (DEMADEX) 20 MG tablet Take 3 tablets (60 mg total) by mouth 2 (two) times daily. 180 tablet 3  . XARELTO 20 MG TABS tablet TAKE ONE TABLET (20 MG TOTAL) BY MOUTH DAILY WITH SUPPER 30 tablet 5  . metoprolol succinate (TOPROL XL) 25 MG 24 hr tablet Take 1 tablet (25 mg total) by mouth daily. 30 tablet 11   No current facility-administered medications for this encounter.     Allergies:   Plavix [clopidogrel bisulfate]   Social History:  The patient  reports that he quit smoking about 19 years ago. His smoking use included cigarettes. He has never used smokeless tobacco. He reports that he does not drink alcohol or use drugs.   Family History:  The patient's family history includes Cancer in his father; Heart Problems in his mother.   ROS:  Please see the history of present illness.   All other systems are personally reviewed and negative.   Exam:   BP 110/84   Pulse (!) 107   Wt 80.1 kg (176 lb 9.6 oz)   SpO2 97%   BMI 23.95 kg/m  General: NAD Neck: JVP 9-10 cm, no thyromegaly or thyroid nodule.  Lungs: Clear to auscultation bilaterally with normal respiratory effort. CV: Nondisplaced PMI.  Heart regular S1/S2, no S3/S4, no murmur.  No peripheral edema.  No carotid bruit.  Normal pedal pulses.  Abdomen: Soft, nontender, no hepatosplenomegaly, no distention.  Skin: Intact without lesions or rashes.  Neurologic: Alert and oriented x 3.  Psych: Normal affect. Extremities: No clubbing or cyanosis.  HEENT: Normal.   Recent  Labs: 10/28/2018: NT-Pro BNP 1,539 11/28/2018: ALT 13; Magnesium 2.7 05/12/2019: B Natriuretic Peptide 469.5; TSH 2.746 06/25/2019: BUN 23; Creatinine, Ser 1.51; Hemoglobin 11.3; Platelets 263; Potassium 4.1; Sodium 133  Personally reviewed   Wt Readings from Last 3 Encounters:  06/25/19 80.1 kg (176 lb 9.6 oz)  05/12/19 84.6 kg (186 lb 6.4 oz)  11/28/18 82.1 kg (181 lb)     ASSESSMENT AND PLAN:  1. Chronic diastolic CHF: Echo in 1/61 with EF 50-55%, severe biatrial enlargement.  He is still volume overloaded on exam.  NYHA class II symptoms. - Increase torsemide to 60 mg bid.  Increase KCl  to 60 qam/40 qpm.  BMET today and again in 10 days.  - With atrial fibrillation and prominent diastolic CHF, cardiac amyloidosis is a concern.  Myeloma panel was negative. I again recommended a PYP scan again and we discussed cardiac amyloidosis.  He is still concerned about coming in for a study during the COVID-19 epidemic.  He will let me know when he is comfortable to have the study done. If creatinine remains stable and PYP is abnormal, a cardiac MRI can also be done (creatinine has been borderline for gadolinium).  2. CAD: PCI to RCA, had stent thrombosis back in 2000.  No CP.  - No ASA given Xarelto use.  - Continue pravastatin, lipids today.   3. Atrial fibrillation: Paroxysmal, NSR today.  He remains on Tikosyn 125 mcg bid. QTc is borderline for Tikosyn use at 500 msec today.  - Continue current Tikosyn carefully, check K and Mg.    - Continue Xarelto. Hgb up today on CBC.  - If he has further breakthrough atrial fibrillation with Tikosyn use, need to consider atrial fibrillation ablation.  - He is only taking metoprolol tartrate once a day.  I will stop this and start Toprol XL 25 mg daily.  4. Microcytic anemia: Fe deficiency.  Hgb down to 7.6 in 8/20.  I am uncertain about the cause for this.  No overt GI bleeding.  Patient thinks that it came from extensive bruising after a fall, but this seems  to be an excessive hgb fall for bruising to create.  Hgb better on oral Fe.  - I strongly recommended GI evaluation, he has never had a colonscopy.  He refuses for now.  - Continue oral Fe.   Followup 6 wks.   Signed, [name], MD  06/25/2019   [facility] [address] [facility] and [place] [address] [phone] (office) [phone] (fax)

## 2019-06-25 NOTE — Patient Instructions (Signed)
An EKG was done today.    Medication Changes :   Discontinue Metoprolol Tartrate   START Toprol XT 25 mg (1 tablet ) daily.   Torsemide was increased to 60 mg ( 3 tablets)  Two times a day.   Potassium was increased to 60 meq ( 3 tablets ) in the Morning and 40 meq ( 2 tablets ) in the Evening.   Labs were done today. We will ONLY call you if something is ABNORMAL. NO CALL IS A GOOD CALL!!   You are scheduled a lab appointment in 10 days.   You are scheduled a follow up in the clinic in 6 weeks.   At the [facility], you and your health needs are our priority. As part of our continuing mission to provide you with exceptional heart care, we have created designated Provider Care Teams. These Care Teams include your primary Cardiologist (physician) and Advanced Practice Providers (APPs- Physician Assistants and Nurse Practitioners) who all work together to provide you with the care you need, when you need it.   You may see any of the following providers on your designated Care Team at your next follow up: Marland Kitchen Dr Glori Bickers . Dr [name] . Darrick Grinder, NP   Please be sure to bring in all your medications bottles to every appointment.

## 2019-07-07 ENCOUNTER — Other Ambulatory Visit: Payer: Self-pay

## 2019-07-07 ENCOUNTER — Ambulatory Visit (HOSPITAL_COMMUNITY)
Admission: RE | Admit: 2019-07-07 | Discharge: 2019-07-07 | Disposition: A | Discharge status: Home / Self Care | Payer: PPO | Source: Ambulatory Visit | Attending: Cardiology | Admitting: Cardiology

## 2019-07-07 ENCOUNTER — Encounter (HOSPITAL_COMMUNITY): Payer: Self-pay

## 2019-07-07 ENCOUNTER — Other Ambulatory Visit (HOSPITAL_COMMUNITY): Payer: Self-pay | Admitting: *Deleted

## 2019-07-07 DIAGNOSIS — I5032 Chronic diastolic (congestive) heart failure: Secondary | ICD-10-CM | POA: Insufficient documentation

## 2019-07-07 LAB — BASIC METABOLIC PANEL
Anion gap: 13 (ref 5–15)
BUN: 25 mg/dL — ABNORMAL HIGH (ref 8–23)
CO2: 25 mmol/L (ref 22–32)
Calcium: 9.2 mg/dL (ref 8.9–10.3)
Chloride: 93 mmol/L — ABNORMAL LOW (ref 98–111)
Creatinine, Ser: 1.56 mg/dL — ABNORMAL HIGH (ref 0.61–1.24)
GFR calc Af Amer: 49 mL/min — ABNORMAL LOW (ref >60)
GFR calc non Af Amer: 43 mL/min — ABNORMAL LOW (ref >60)
Glucose, Bld: 138 mg/dL — ABNORMAL HIGH (ref 70–99)
Potassium: 4.1 mmol/L (ref 3.5–5.1)
Sodium: 131 mmol/L — ABNORMAL LOW (ref 135–145)

## 2019-07-07 MED ORDER — DOFETILIDE 125 MCG PO CAPS: 125.0000 ug | ORAL_CAPSULE | Freq: Two times a day (BID) | ORAL | 2 refills | Status: DC

## 2019-08-08 ENCOUNTER — Other Ambulatory Visit: Payer: Self-pay

## 2019-08-08 ENCOUNTER — Ambulatory Visit (HOSPITAL_COMMUNITY)
Admission: RE | Admit: 2019-08-08 | Discharge: 2019-08-08 | Disposition: A | Discharge status: Home / Self Care | Payer: PPO | Source: Ambulatory Visit | Attending: Cardiology | Admitting: Cardiology

## 2019-08-08 ENCOUNTER — Encounter (HOSPITAL_COMMUNITY): Payer: Self-pay | Admitting: Cardiology

## 2019-08-08 VITALS — BP 110/84 | HR 87 | Wt 172.4 lb

## 2019-08-08 DIAGNOSIS — Z955 Presence of coronary angioplasty implant and graft: Secondary | ICD-10-CM | POA: Diagnosis not present

## 2019-08-08 DIAGNOSIS — I2510 Atherosclerotic heart disease of native coronary artery without angina pectoris: Secondary | ICD-10-CM | POA: Diagnosis not present

## 2019-08-08 DIAGNOSIS — I252 Old myocardial infarction: Secondary | ICD-10-CM | POA: Diagnosis not present

## 2019-08-08 DIAGNOSIS — D509 Iron deficiency anemia, unspecified: Secondary | ICD-10-CM | POA: Diagnosis not present

## 2019-08-08 DIAGNOSIS — I130 Hypertensive heart and chronic kidney disease with heart failure and stage 1 through stage 4 chronic kidney disease, or unspecified chronic kidney disease: Secondary | ICD-10-CM | POA: Diagnosis not present

## 2019-08-08 DIAGNOSIS — Z7901 Long term (current) use of anticoagulants: Secondary | ICD-10-CM | POA: Diagnosis not present

## 2019-08-08 DIAGNOSIS — I5032 Chronic diastolic (congestive) heart failure: Secondary | ICD-10-CM | POA: Diagnosis not present

## 2019-08-08 DIAGNOSIS — Z79899 Other long term (current) drug therapy: Secondary | ICD-10-CM | POA: Diagnosis not present

## 2019-08-08 DIAGNOSIS — G4733 Obstructive sleep apnea (adult) (pediatric): Secondary | ICD-10-CM | POA: Diagnosis not present

## 2019-08-08 DIAGNOSIS — I480 Paroxysmal atrial fibrillation: Secondary | ICD-10-CM | POA: Diagnosis not present

## 2019-08-08 DIAGNOSIS — Z87891 Personal history of nicotine dependence: Secondary | ICD-10-CM | POA: Insufficient documentation

## 2019-08-08 LAB — CBC
HCT: 36.4 % — ABNORMAL LOW (ref 39.0–52.0)
Hemoglobin: 11.8 g/dL — ABNORMAL LOW (ref 13.0–17.0)
MCH: 30.6 pg (ref 26.0–34.0)
MCHC: 32.4 g/dL (ref 30.0–36.0)
MCV: 94.3 fL (ref 80.0–100.0)
Platelets: 305 10*3/uL (ref 150–400)
RBC: 3.86 MIL/uL — ABNORMAL LOW (ref 4.22–5.81)
RDW: 14.5 % (ref 11.5–15.5)
WBC: 9.1 10*3/uL (ref 4.0–10.5)
nRBC: 0 % (ref 0.0–0.2)

## 2019-08-08 LAB — BASIC METABOLIC PANEL
Anion gap: 11 (ref 5–15)
BUN: 22 mg/dL (ref 8–23)
CO2: 26 mmol/L (ref 22–32)
Calcium: 9.2 mg/dL (ref 8.9–10.3)
Chloride: 94 mmol/L — ABNORMAL LOW (ref 98–111)
Creatinine, Ser: 1.46 mg/dL — ABNORMAL HIGH (ref 0.61–1.24)
GFR calc Af Amer: 53 mL/min — ABNORMAL LOW (ref >60)
GFR calc non Af Amer: 46 mL/min — ABNORMAL LOW (ref >60)
Glucose, Bld: 111 mg/dL — ABNORMAL HIGH (ref 70–99)
Potassium: 3.9 mmol/L (ref 3.5–5.1)
Sodium: 131 mmol/L — ABNORMAL LOW (ref 135–145)

## 2019-08-08 LAB — MAGNESIUM: Magnesium: 2.3 mg/dL (ref 1.7–2.4)

## 2019-08-08 MED ORDER — TORSEMIDE 20 MG PO TABS: ORAL_TABLET | ORAL | 5 refills | Status: DC

## 2019-08-08 MED ORDER — SPIRONOLACTONE 25 MG PO TABS: 25.0000 mg | ORAL_TABLET | Freq: Every day | ORAL | 3 refills | Status: DC

## 2019-08-08 NOTE — Patient Instructions (Signed)
INCREASE Spironolactone to 25mg  (1 tab) daily  INCREASE Torsemide to 80mg  (4 tabs) in the morning and 60mg  (3 tabs) in the evening  Labs today and repeat in 10 days We will only contact you if something comes back abnormal or we need to make some changes. Otherwise no news is good news!  Your physician has requested that you have an echocardiogram. Echocardiography is a painless test that uses sound waves to create images of your heart. It provides your doctor with information about the size and shape of your heart and how well your heart's chambers and valves are working. This procedure takes approximately one hour. There are no restrictions for this procedure.  Your physician recommends that you schedule a follow-up appointment in: 2 months with an echo  At the [facility], you and your health needs are our priority. As part of our continuing mission to provide you with exceptional heart care, we have created designated Provider Care Teams. These Care Teams include your primary Cardiologist (physician) and Advanced Practice Providers (APPs- Physician Assistants and Nurse Practitioners) who all work together to provide you with the care you need, when you need it.   You may see any of the following providers on your designated Care Team at your next follow up: Marland Kitchen Dr Glori Bickers . Dr [name] . Darrick Grinder, NP . Lyda Jester, PA   Please be sure to bring in all your medications bottles to every appointment.

## 2019-08-08 NOTE — Progress Notes (Signed)
Medication Samples have been provided to the patient.  Drug name: xarelto       Strength: 20mg         Qty: 28  LOT: [id number]  Exp.Date: 5/22   Dosing instructions: 1 tab every evening  The patient has been instructed regarding the correct time, dose, and frequency of taking this medication, including desired effects and most common side effects.   Melana Hingle 11:59 AM 08/08/2019

## 2019-08-10 NOTE — Progress Notes (Signed)
Date:  08/10/2019   ID:  Larry [name], DOB 1941-08-24, MRN [id number]  Provider location: [address], [place] Type of Visit: Established patient  PCP:  Delphina Cahill  Cardiologist:  Jenkins Rouge, MD Primary HF: Dr. Aundra Dubin   History of Present Illness: Larry [name] is a 78 y.o. male who has a history of CAD, persistent atrial fibrillation, and chronic diastolic CHF.  Patient had initial PCI to RCA in 11/1998.  This was complicated by stent thrombosis with inferior STEMI in 08/1999 requiring PCI.   In 2017, he was noted to be in atrial fibrillation persistently.  He decided not to undergo cardioversion.  Echo in 5/19 showed EF 50-55%, severe biatrial enlargement.   He was doing well until around 5/19. At that time, he noted his HR running higher and he developed peripheral edema.  He would "give out" much more easily.  No chest pain, just felt a severe lack of energy. He would get short of breath walking short distances.  He was started on torsemide.  He was admitted in 6/19 for diuresis (acute/chronic diastolic CHF).    He was started on Tikosyn and went back into NSR. At a prior appointment with me, he was noted to be in atrial fibrillation with prolonged QT interval.  I discussed with Dr. Rayann Heman and decreased Tikosyn to 125 mcg bid.  By 11/15/18, he was back in NSR, QTc ok.  In early 3/20, he noted low BP and lightheadedness.  I stopped his metoprolol and spironolactone.    Recently, he was seen by the NP in this clinic with complaint of worsening dyspnea.  CBC was checked, hgb was 7.6.  Torsemide was also increased due to concern for volume overload.  He has not had melena or BRBPR.  GI referral was recommended, he has never had a colonoscopy.  However, he declined this.  He thinks that his blood loss came from extensive bruising from a fall that he had had.  He was started on oral iron.  Hemoglobin as trended up, it was 11.3 in 9/20.   He returns for followup of CHF.  He says  that he is walking more, no dyspnea walking on flat ground or up a flight of stairs.  Weight down 4 lbs since last appt. Legs fatigue easily.  No chest pain.  No BRBPR/melena.    ECG (personally reviewed): NSR, 1st degree AVB 212 msec, QTc 488 msec, anteroseptal Qs  Labs (7/19): K 4.2, creatinine 1.25 => 1.3, NT-proBNP 3260, hgb 16.3 Labs (8/19): K 3.9, creatinine 1.26, LDL 54 Labs (1/20): pro-BNP 1439, K 4.4, creatinine 1.3 Labs (2/20): K 4.3, creatinine 1.5, myeloma panel negative  Labs (8/20): K 3.6, creatinine 1.5, hgb 7.6, TSH normal, BNP 470 Labs (9/20): hgb 11.3, K 4.1, creatinine 1.56, LDL 57, HDL 28  PMH: 1. Atrial fibrillation: Now on Tikosyn.  2. HTN 3. OSA: Uses CPAP.  4. CAD: PCI to RCA in 11/1998.  - Stent thrombosis with inferior STEMI and PCI RCA in 08/1999.  5. Chronic diastolic CHF: Echo (4/96) with EF 50-55%, severe biatrial enlargement.  - 6/19 CHF exacerbation, hospitalized.   6. CKD: Stage 3.   Past Surgical History:  Procedure Laterality Date  . CARDIAC CATHETERIZATION  12/15/1999   EF was 55%   . CORONARY ANGIOPLASTY WITH STENT PLACEMENT  08/15/1999   CAD, status post prior stenting of the mid to distal RCA in 11/1998, with an acute diaphragmatic wall infarction due to total occlusion  at the stent site/There is also 70% narrowing in the diagonal branch of the LAD/The LV showed inferior wall hypo- akinesis.-- Successful reperfusion, percutaneous transluminal coronary percutaneous transluminal coronary & placement of a 2nd overlying stent in RCA     Current Outpatient Medications  Medication Sig Dispense Refill  . acetaminophen (TYLENOL) 500 MG tablet Take 500 mg by mouth every 6 (six) hours as needed.    . dofetilide (TIKOSYN) 125 MCG capsule Take 1 capsule (125 mcg total) by mouth 2 (two) times daily. 180 capsule 2  . Ferrous Sulfate (IRON PO) Take 25 mg by mouth daily.    Marland Kitchen loratadine (CLARITIN) 10 MG tablet Take 10 mg by mouth daily.    . metoprolol  succinate (TOPROL XL) 25 MG 24 hr tablet Take 1 tablet (25 mg total) by mouth daily. 30 tablet 11  . nitroGLYCERIN (NITROSTAT) 0.4 MG SL tablet Place 1 tablet (0.4 mg total) under the tongue every 5 (five) minutes as needed for chest pain (3 doses max). 25 tablet 3  . potassium chloride (K-DUR) 20 MEQ tablet Take 3 tablets (60 mEq total) by mouth every morning AND 2 tablets (40 mEq total) every evening. 150 tablet 3  . pravastatin (PRAVACHOL) 40 MG tablet TAKE 1 TABLET BY MOUTH EVERY DAY IN THE EVENING 90 tablet 3  . spironolactone (ALDACTONE) 25 MG tablet Take 1 tablet (25 mg total) by mouth daily. 90 tablet 3  . torsemide (DEMADEX) 20 MG tablet Take 4 tablets (80 mg total) by mouth every morning AND 3 tablets (60 mg total) every evening. 210 tablet 5  . XARELTO 20 MG TABS tablet TAKE ONE TABLET (20 MG TOTAL) BY MOUTH DAILY WITH SUPPER 30 tablet 5   No current facility-administered medications for this encounter.     Allergies:   Plavix [clopidogrel bisulfate]   Social History:  The patient  reports that he quit smoking about 19 years ago. His smoking use included cigarettes. He has never used smokeless tobacco. He reports that he does not drink alcohol or use drugs.   Family History:  The patient's family history includes Cancer in his father; Heart Problems in his mother.   ROS:  Please see the history of present illness.   All other systems are personally reviewed and negative.   Exam:   BP 110/84   Pulse 87   Wt 78.2 kg (172 lb 6.4 oz)   SpO2 95%   BMI 23.38 kg/m  General: NAD Neck: JVP 10-11 cm, no thyromegaly or thyroid nodule.  Lungs: Clear to auscultation bilaterally with normal respiratory effort. CV: Nondisplaced PMI.  Heart regular S1/S2, no S3/S4, no murmur.  1+ ankle edema.  No carotid bruit.  Normal pedal pulses.  Abdomen: Soft, nontender, no hepatosplenomegaly, no distention.  Skin: Intact without lesions or rashes.  Neurologic: Alert and oriented x 3.  Psych: Normal  affect. Extremities: No clubbing or cyanosis.  HEENT: Normal.   Recent Labs: 10/28/2018: NT-Pro BNP 1,539 11/28/2018: ALT 13 05/12/2019: B Natriuretic Peptide 469.5; TSH 2.746 08/08/2019: BUN 22; Creatinine, Ser 1.46; Hemoglobin 11.8; Magnesium 2.3; Platelets 305; Potassium 3.9; Sodium 131  Personally reviewed   Wt Readings from Last 3 Encounters:  08/08/19 78.2 kg (172 lb 6.4 oz)  06/25/19 80.1 kg (176 lb 9.6 oz)  05/12/19 84.6 kg (186 lb 6.4 oz)     ASSESSMENT AND PLAN:  1. Chronic diastolic CHF: Echo in 5/17 with EF 50-55%, severe biatrial enlargement.  He remains volume overloaded on exam.  NYHA  class II symptoms. - Increase torsemide to 80 qam/60 qpm.  Continue KCl 60 qam/40 qpm.  BMET today and again in 10 days.  - Increase spironolactone to 25 mg daily.  - With atrial fibrillation and prominent diastolic CHF, cardiac amyloidosis is a concern.  Myeloma panel was negative. I again recommended a PYP scan and we discussed cardiac amyloidosis.  He is still concerned about coming in for a study during the COVID-19 epidemic.  He will let me know when he is comfortable to have the study done. If creatinine remains stable and PYP is abnormal, a cardiac MRI can also be done (creatinine has been borderline for gadolinium).   - I will have him get an echo at next appointment with me in 2 months.  2. CAD: PCI to RCA, had stent thrombosis back in 2000.  No CP.  - No ASA given Xarelto use.  - Continue pravastatin, good lipids in 9/20.  3. Atrial fibrillation: Paroxysmal, NSR today.  He remains on Tikosyn 125 mcg bid. QTc on ECG today is ok for Tikosyn.  - Continue current Tikosyn carefully, check K and Mg.    - Continue Xarelto. CBC today.  - If he has further breakthrough atrial fibrillation with Tikosyn use, need to consider atrial fibrillation ablation.  - Continue Toprol XL 25 mg daily.  4. Microcytic anemia: Fe deficiency.  Hgb down to 7.6 in 8/20.  I am uncertain about the cause for this.   No overt GI bleeding.  Patient thinks that it came from extensive bruising after a fall, but this seems to be an excessive hgb fall for bruising to create.  Hgb better on oral Fe, up to 11.3 in 9/20.  - I have strongly recommended GI evaluation, he has never had a colonscopy.  He has refused.   - Continue oral Fe.   Followup in 2 months with echo.   Signed, [name], MD  08/10/2019   [facility] [address] Heart and [place] [address] [phone] (office) [phone] (fax)

## 2019-08-18 ENCOUNTER — Ambulatory Visit (HOSPITAL_COMMUNITY)
Admission: RE | Admit: 2019-08-18 | Discharge: 2019-08-18 | Disposition: A | Discharge status: Home / Self Care | Payer: PPO | Source: Ambulatory Visit | Attending: Cardiology | Admitting: Cardiology

## 2019-08-18 ENCOUNTER — Other Ambulatory Visit: Payer: Self-pay

## 2019-08-18 DIAGNOSIS — I5032 Chronic diastolic (congestive) heart failure: Secondary | ICD-10-CM | POA: Diagnosis not present

## 2019-08-18 LAB — BASIC METABOLIC PANEL
Anion gap: 13 (ref 5–15)
BUN: 26 mg/dL — ABNORMAL HIGH (ref 8–23)
CO2: 25 mmol/L (ref 22–32)
Calcium: 9 mg/dL (ref 8.9–10.3)
Chloride: 92 mmol/L — ABNORMAL LOW (ref 98–111)
Creatinine, Ser: 1.6 mg/dL — ABNORMAL HIGH (ref 0.61–1.24)
GFR calc Af Amer: 48 mL/min — ABNORMAL LOW (ref >60)
GFR calc non Af Amer: 41 mL/min — ABNORMAL LOW (ref >60)
Glucose, Bld: 117 mg/dL — ABNORMAL HIGH (ref 70–99)
Potassium: 4.4 mmol/L (ref 3.5–5.1)
Sodium: 130 mmol/L — ABNORMAL LOW (ref 135–145)

## 2019-09-01 DIAGNOSIS — G4733 Obstructive sleep apnea (adult) (pediatric): Secondary | ICD-10-CM | POA: Diagnosis not present

## 2019-09-17 ENCOUNTER — Telehealth (HOSPITAL_COMMUNITY): Payer: Self-pay

## 2019-09-17 NOTE — Telephone Encounter (Signed)
Opened in Error.

## 2019-10-01 ENCOUNTER — Other Ambulatory Visit: Payer: Self-pay | Admitting: Cardiovascular Disease

## 2019-10-01 DIAGNOSIS — G4733 Obstructive sleep apnea (adult) (pediatric): Secondary | ICD-10-CM | POA: Diagnosis not present

## 2019-10-01 DIAGNOSIS — I4891 Unspecified atrial fibrillation: Secondary | ICD-10-CM

## 2019-10-08 ENCOUNTER — Encounter (HOSPITAL_COMMUNITY): Payer: PPO | Admitting: Cardiology

## 2019-10-08 ENCOUNTER — Other Ambulatory Visit (HOSPITAL_COMMUNITY): Payer: PPO

## 2019-10-09 ENCOUNTER — Telehealth (HOSPITAL_COMMUNITY): Payer: Self-pay

## 2019-10-09 NOTE — Telephone Encounter (Signed)
Pt wife called to cancel appt for 10/12/18 because of exposure to COVID 19. States that she will call and reschedule his appointment when able.

## 2019-10-13 ENCOUNTER — Ambulatory Visit (HOSPITAL_COMMUNITY): Payer: PPO

## 2019-10-13 ENCOUNTER — Encounter (HOSPITAL_COMMUNITY): Payer: PPO | Admitting: Cardiology

## 2019-10-21 ENCOUNTER — Other Ambulatory Visit (HOSPITAL_COMMUNITY): Payer: Self-pay | Admitting: Cardiology

## 2019-10-21 DIAGNOSIS — I4891 Unspecified atrial fibrillation: Secondary | ICD-10-CM

## 2019-10-22 ENCOUNTER — Other Ambulatory Visit (HOSPITAL_COMMUNITY): Payer: Self-pay | Admitting: Cardiology

## 2019-10-22 DIAGNOSIS — I4891 Unspecified atrial fibrillation: Secondary | ICD-10-CM

## 2019-11-01 DIAGNOSIS — G4733 Obstructive sleep apnea (adult) (pediatric): Secondary | ICD-10-CM | POA: Diagnosis not present

## 2019-11-01 IMAGING — DX DG CHEST 2V
2 series · 2 of 2 positions shown · non-contrast
Comparison: CT chest 12/03/2016.  Chest x-ray 12/03/2016

CLINICAL DATA: Short of breath and swelling

EXAM:
CHEST - 2 VIEW

[chest pa]
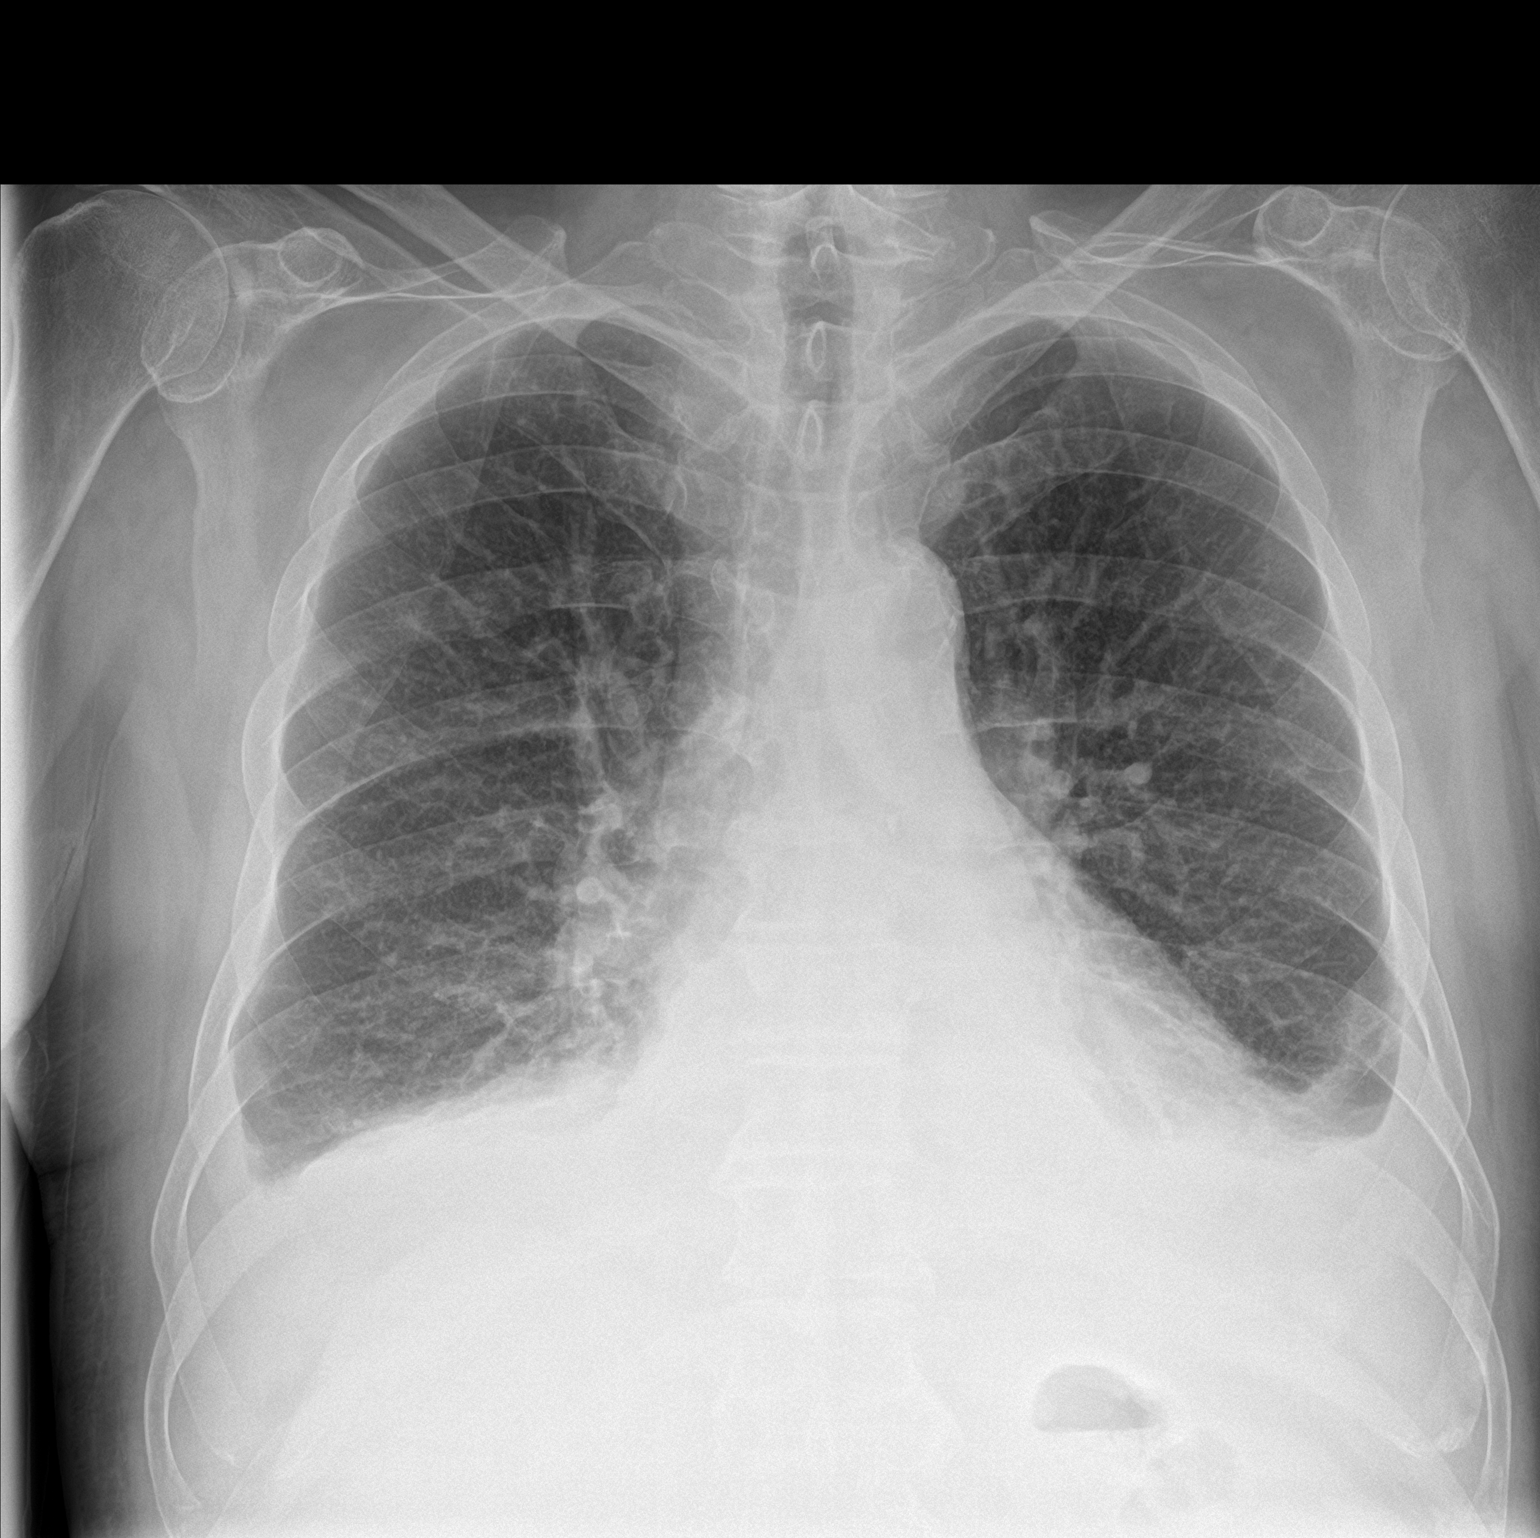

[chest lat]
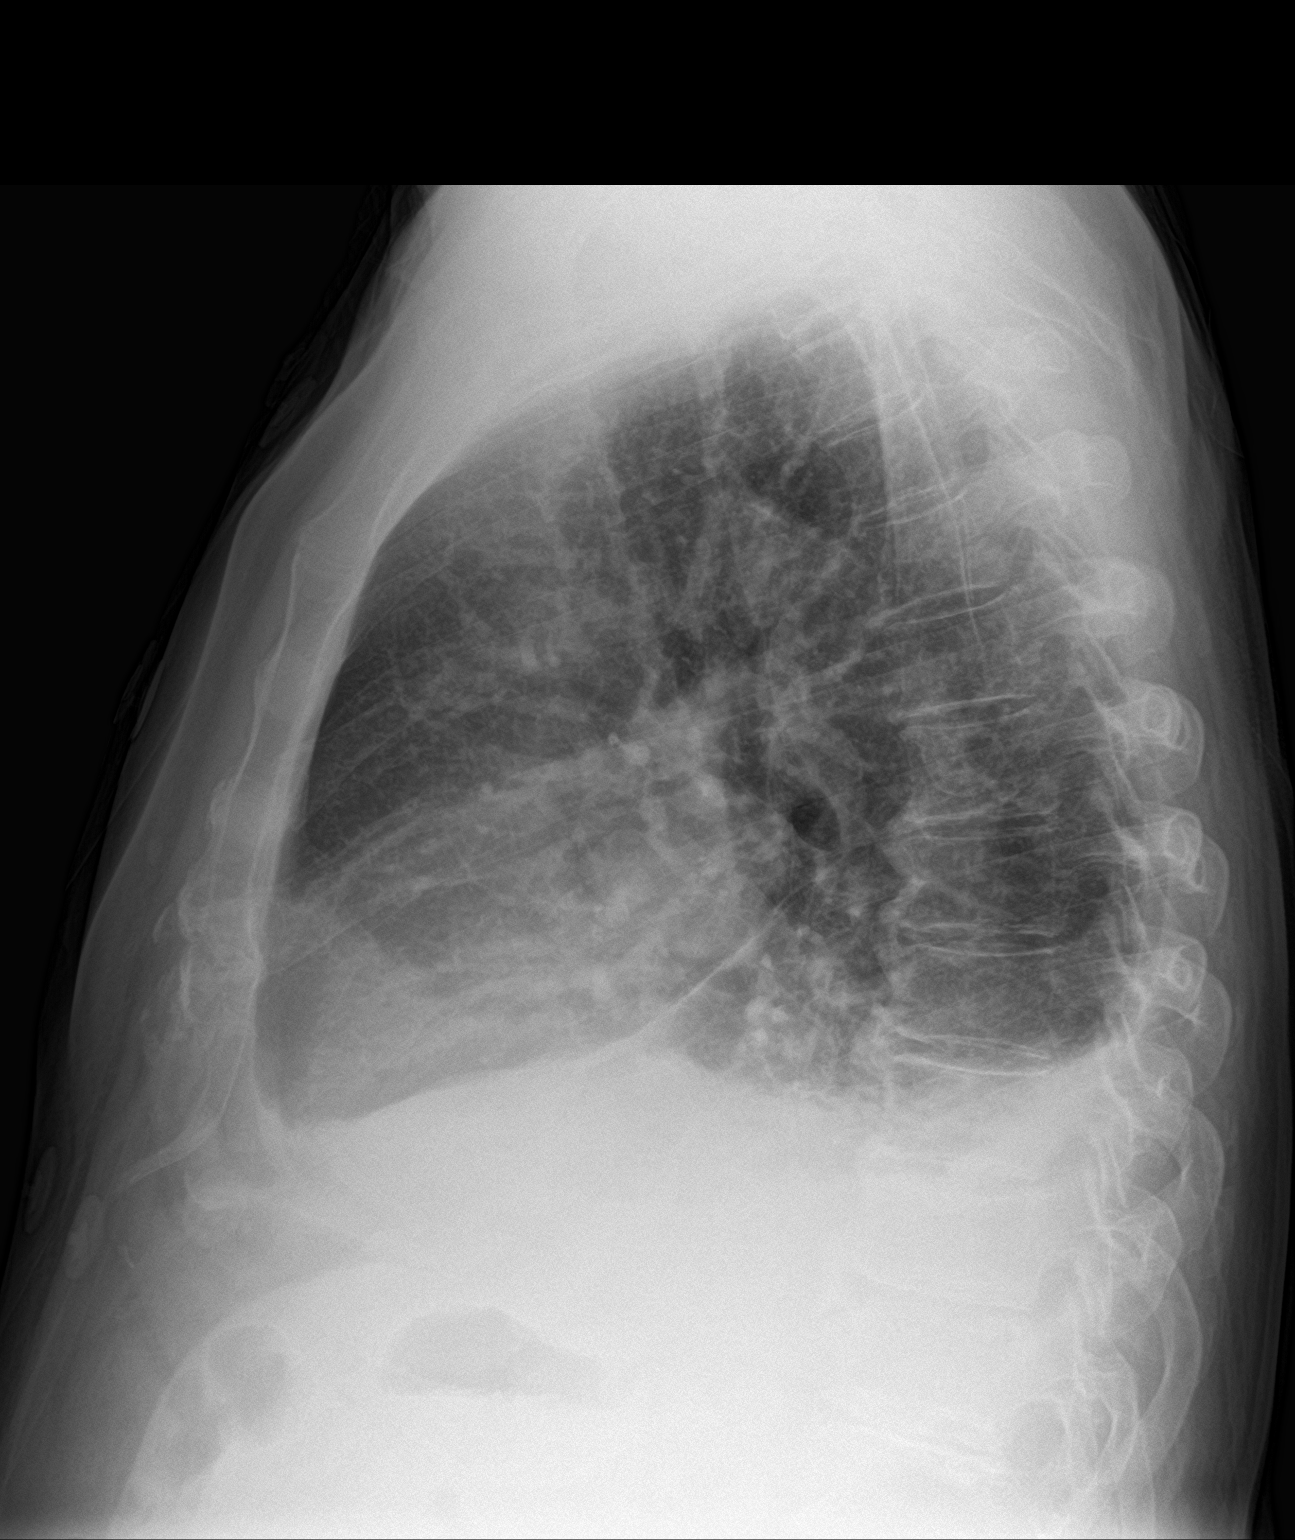

[2 of 2 positions shown; findings below may reference images not displayed]

FINDINGS: Pulmonary vascular congestion unchanged. Progression of small
bilateral effusions and bibasilar atelectasis. Negative for edema.
IMPRESSION: Pulmonary vascular congestion and small pleural effusions consistent
with fluid overload. Bibasilar atelectasis.

## 2019-11-24 ENCOUNTER — Encounter (HOSPITAL_COMMUNITY): Payer: PPO

## 2019-12-02 ENCOUNTER — Other Ambulatory Visit (HOSPITAL_COMMUNITY): Payer: Self-pay | Admitting: Cardiology

## 2019-12-02 DIAGNOSIS — G4733 Obstructive sleep apnea (adult) (pediatric): Secondary | ICD-10-CM | POA: Diagnosis not present

## 2019-12-04 ENCOUNTER — Ambulatory Visit (HOSPITAL_COMMUNITY)
Admission: RE | Admit: 2019-12-04 | Discharge: 2019-12-04 | Disposition: A | Discharge status: Home / Self Care | Payer: PPO | Source: Ambulatory Visit | Attending: Cardiology | Admitting: Cardiology

## 2019-12-04 ENCOUNTER — Other Ambulatory Visit: Payer: Self-pay

## 2019-12-04 ENCOUNTER — Telehealth (HOSPITAL_COMMUNITY): Payer: Self-pay

## 2019-12-04 ENCOUNTER — Encounter (HOSPITAL_COMMUNITY): Payer: Self-pay

## 2019-12-04 VITALS — BP 120/72 | HR 78 | Wt 172.8 lb

## 2019-12-04 DIAGNOSIS — G629 Polyneuropathy, unspecified: Secondary | ICD-10-CM | POA: Diagnosis not present

## 2019-12-04 DIAGNOSIS — Z87891 Personal history of nicotine dependence: Secondary | ICD-10-CM | POA: Insufficient documentation

## 2019-12-04 DIAGNOSIS — Z955 Presence of coronary angioplasty implant and graft: Secondary | ICD-10-CM | POA: Insufficient documentation

## 2019-12-04 DIAGNOSIS — I480 Paroxysmal atrial fibrillation: Secondary | ICD-10-CM | POA: Insufficient documentation

## 2019-12-04 DIAGNOSIS — Z8249 Family history of ischemic heart disease and other diseases of the circulatory system: Secondary | ICD-10-CM | POA: Diagnosis not present

## 2019-12-04 DIAGNOSIS — I25118 Atherosclerotic heart disease of native coronary artery with other forms of angina pectoris: Secondary | ICD-10-CM | POA: Diagnosis not present

## 2019-12-04 DIAGNOSIS — Z79899 Other long term (current) drug therapy: Secondary | ICD-10-CM | POA: Diagnosis not present

## 2019-12-04 DIAGNOSIS — I4819 Other persistent atrial fibrillation: Secondary | ICD-10-CM | POA: Diagnosis present

## 2019-12-04 DIAGNOSIS — I130 Hypertensive heart and chronic kidney disease with heart failure and stage 1 through stage 4 chronic kidney disease, or unspecified chronic kidney disease: Secondary | ICD-10-CM | POA: Diagnosis not present

## 2019-12-04 DIAGNOSIS — I2510 Atherosclerotic heart disease of native coronary artery without angina pectoris: Secondary | ICD-10-CM | POA: Insufficient documentation

## 2019-12-04 DIAGNOSIS — G4733 Obstructive sleep apnea (adult) (pediatric): Secondary | ICD-10-CM | POA: Insufficient documentation

## 2019-12-04 DIAGNOSIS — I509 Heart failure, unspecified: Secondary | ICD-10-CM

## 2019-12-04 DIAGNOSIS — Z888 Allergy status to other drugs, medicaments and biological substances status: Secondary | ICD-10-CM | POA: Insufficient documentation

## 2019-12-04 DIAGNOSIS — Z7901 Long term (current) use of anticoagulants: Secondary | ICD-10-CM | POA: Diagnosis not present

## 2019-12-04 DIAGNOSIS — I4891 Unspecified atrial fibrillation: Secondary | ICD-10-CM

## 2019-12-04 DIAGNOSIS — N183 Chronic kidney disease, stage 3 unspecified: Secondary | ICD-10-CM | POA: Insufficient documentation

## 2019-12-04 DIAGNOSIS — I5032 Chronic diastolic (congestive) heart failure: Secondary | ICD-10-CM | POA: Insufficient documentation

## 2019-12-04 DIAGNOSIS — R9431 Abnormal electrocardiogram [ECG] [EKG]: Secondary | ICD-10-CM | POA: Diagnosis not present

## 2019-12-04 DIAGNOSIS — D509 Iron deficiency anemia, unspecified: Secondary | ICD-10-CM | POA: Diagnosis not present

## 2019-12-04 DIAGNOSIS — I252 Old myocardial infarction: Secondary | ICD-10-CM | POA: Insufficient documentation

## 2019-12-04 LAB — CBC
HCT: 33.6 % — ABNORMAL LOW (ref 39.0–52.0)
Hemoglobin: 10.1 g/dL — ABNORMAL LOW (ref 13.0–17.0)
MCH: 24.9 pg — ABNORMAL LOW (ref 26.0–34.0)
MCHC: 30.1 g/dL (ref 30.0–36.0)
MCV: 82.8 fL (ref 80.0–100.0)
Platelets: 250 10*3/uL (ref 150–400)
RBC: 4.06 MIL/uL — ABNORMAL LOW (ref 4.22–5.81)
RDW: 15.9 % — ABNORMAL HIGH (ref 11.5–15.5)
WBC: 7.6 10*3/uL (ref 4.0–10.5)
nRBC: 0 % (ref 0.0–0.2)

## 2019-12-04 LAB — BASIC METABOLIC PANEL
Anion gap: 12 (ref 5–15)
BUN: 27 mg/dL — ABNORMAL HIGH (ref 8–23)
CO2: 22 mmol/L (ref 22–32)
Calcium: 8.6 mg/dL — ABNORMAL LOW (ref 8.9–10.3)
Chloride: 98 mmol/L (ref 98–111)
Creatinine, Ser: 1.84 mg/dL — ABNORMAL HIGH (ref 0.61–1.24)
GFR calc Af Amer: 40 mL/min — ABNORMAL LOW (ref >60)
GFR calc non Af Amer: 35 mL/min — ABNORMAL LOW (ref >60)
Glucose, Bld: 102 mg/dL — ABNORMAL HIGH (ref 70–99)
Potassium: 4 mmol/L (ref 3.5–5.1)
Sodium: 132 mmol/L — ABNORMAL LOW (ref 135–145)

## 2019-12-04 LAB — MAGNESIUM: Magnesium: 2.5 mg/dL — ABNORMAL HIGH (ref 1.7–2.4)

## 2019-12-04 MED ORDER — PRAVASTATIN SODIUM 40 MG PO TABS: 40.0000 mg | ORAL_TABLET | Freq: Every evening | ORAL | 0 refills | Status: AC

## 2019-12-04 MED ORDER — PRAVASTATIN SODIUM 40 MG PO TABS: 40.0000 mg | ORAL_TABLET | Freq: Every evening | ORAL | 0 refills | Status: DC

## 2019-12-04 NOTE — Patient Instructions (Addendum)
Lab work done today. We will notify you of any abnormal lab work. No news is good news!  EKG done today.  REFILLED Pravastatin  Your physician has requested that you have an echocardiogram. Echocardiography is a painless test that uses sound waves to create images of your heart. It provides your doctor with information about the size and shape of your heart and how well your heart's chambers and valves are working. This procedure takes approximately one hour. There are no restrictions for this procedure. This will be done at your follow up appointment in 3 months.  Please follow up with the [facility] in 3 months.   At the [facility], you and your health needs are our priority. As part of our continuing mission to provide you with exceptional heart care, we have created designated Provider Care Teams. These Care Teams include your primary Cardiologist (physician) and Advanced Practice Providers (APPs- Physician Assistants and Nurse Practitioners) who all work together to provide you with the care you need, when you need it.   You may see any of the following providers on your designated Care Team at your next follow up: Marland Kitchen Dr Glori Bickers . Dr [name] . Darrick Grinder, NP . Lyda Jester, PA . Audry Riles, PharmD   Please be sure to bring in all your medications bottles to every appointment.

## 2019-12-04 NOTE — Telephone Encounter (Signed)
Called and spoke to pt and wife. They verbalized understanding of new orders. F/u with Afib made for March 9th at 3pm.

## 2019-12-04 NOTE — Progress Notes (Signed)
Date:  12/04/2019   ID:  Larry [name], DOB Feb 07, 1941, MRN [id number]  Provider location: [address], [place] Type of Visit: Established patient  PCP:  Delphina Cahill  Cardiologist:  Jenkins Rouge, MD Primary HF: Dr. Aundra Dubin   History of Present Illness: Larry [name] is a 79 y.o. male who has a history of CAD, persistent atrial fibrillation, and chronic diastolic CHF.  Patient had initial PCI to RCA in 11/1998.  This was complicated by stent thrombosis with inferior STEMI in 08/1999 requiring PCI.   In 2017, he was noted to be in atrial fibrillation persistently.  He decided not to undergo cardioversion.  Echo in 5/19 showed EF 50-55%, severe biatrial enlargement.   He was doing well until around 5/19. At that time, he noted his HR running higher and he developed peripheral edema.  He would "give out" much more easily.  No chest pain, just felt a severe lack of energy. He would get short of breath walking short distances.  He was started on torsemide.  He was admitted in 6/19 for diuresis (acute/chronic diastolic CHF).    He was started on Tikosyn and went back into NSR. At a prior appointment with me, he was noted to be in atrial fibrillation with prolonged QT interval.  I discussed with Dr. Rayann Heman and decreased Tikosyn to 125 mcg bid.  By 11/15/18, he was back in NSR, QTc ok.  In early 3/20, he noted low BP and lightheadedness.  I stopped his metoprolol and spironolactone.    In August CBC was checked, hgb was 7.6.  Torsemide was also increased due to concern for volume overload.  He has not had melena or BRBPR.  GI referral was recommended, he has never had a colonoscopy.  However, he declined this.  He thinks that his blood loss came from extensive bruising from a fall that he had had.  He was started on oral iron.  Hemoglobin as trended up, it was 11.3 in 9/20.   Today he returns for HF follow up.Overall feeling fine but says he gets tired. Says he gets tired after walking 500  feet. Walks slowly to prevents falls. Has some neuropathy and has ongoing numbness. No BRBPR. Denies SOB/PND/Orthopnea. Using CPAP.  Appetite ok. No fever or chills. Weight at home has been stable.   pounds. Taking all medications. Lives with his wife.   Labs (7/19): K 4.2, creatinine 1.25 => 1.3, NT-proBNP 3260, hgb 16.3 Labs (8/19): K 3.9, creatinine 1.26, LDL 54 Labs (1/20): pro-BNP 1439, K 4.4, creatinine 1.3 Labs (2/20): K 4.3, creatinine 1.5, myeloma panel negative  Labs (8/20): K 3.6, creatinine 1.5, hgb 7.6, TSH normal, BNP 470 Labs (9/20): hgb 11.3, K 4.1, creatinine 1.56, LDL 57, HDL 28  PMH: 1. Atrial fibrillation: Now on Tikosyn.  2. HTN 3. OSA: Uses CPAP.  4. CAD: PCI to RCA in 11/1998.  - Stent thrombosis with inferior STEMI and PCI RCA in 08/1999.  5. Chronic diastolic CHF: Echo (7/35) with EF 50-55%, severe biatrial enlargement.  - 6/19 CHF exacerbation, hospitalized.   6. CKD: Stage 3.   Past Surgical History:  Procedure Laterality Date  . CARDIAC CATHETERIZATION  12/15/1999   EF was 55%   . CORONARY ANGIOPLASTY WITH STENT PLACEMENT  08/15/1999   CAD, status post prior stenting of the mid to distal RCA in 11/1998, with an acute diaphragmatic wall infarction due to total occlusion at the stent site/There is also 70% narrowing in the diagonal  branch of the LAD/The LV showed inferior wall hypo- akinesis.-- Successful reperfusion, percutaneous transluminal coronary percutaneous transluminal coronary & placement of a 2nd overlying stent in RCA     Current Outpatient Medications  Medication Sig Dispense Refill  . acetaminophen (TYLENOL) 500 MG tablet Take 500 mg by mouth every 6 (six) hours as needed.    . dofetilide (TIKOSYN) 125 MCG capsule Take 1 capsule (125 mcg total) by mouth 2 (two) times daily. 180 capsule 2  . Ferrous Sulfate (IRON PO) Take 25 mg by mouth daily.    Marland Kitchen loratadine (CLARITIN) 10 MG tablet Take 10 mg by mouth daily.    . metoprolol succinate (TOPROL  XL) 25 MG 24 hr tablet Take 1 tablet (25 mg total) by mouth daily. 30 tablet 11  . potassium chloride (K-DUR) 20 MEQ tablet Take 3 tablets (60 mEq total) by mouth every morning AND 2 tablets (40 mEq total) every evening. 150 tablet 3  . pravastatin (PRAVACHOL) 40 MG tablet TAKE ONE TABLET BY MOUTH EVERY DAY IN THE EVENING 15 tablet 0  . spironolactone (ALDACTONE) 25 MG tablet Take 1 tablet (25 mg total) by mouth daily. (Patient taking differently: Take 12.5 mg by mouth daily. ) 90 tablet 3  . torsemide (DEMADEX) 20 MG tablet Take 4 tablets (80 mg total) by mouth every morning AND 3 tablets (60 mg total) every evening. 210 tablet 5  . XARELTO 20 MG TABS tablet TAKE ONE TABLET (20 MG TOTAL) BY MOUTH DAILY WITH SUPPER 30 tablet 5  . nitroGLYCERIN (NITROSTAT) 0.4 MG SL tablet Place 1 tablet (0.4 mg total) under the tongue every 5 (five) minutes as needed for chest pain (3 doses max). (Patient not taking: Reported on 12/04/2019) 25 tablet 3   No current facility-administered medications for this encounter.    Allergies:   Plavix [clopidogrel bisulfate]   Social History:  The patient  reports that he quit smoking about 20 years ago. His smoking use included cigarettes. He has never used smokeless tobacco. He reports that he does not drink alcohol or use drugs.   Family History:  The patient's family history includes Cancer in his father; Heart Problems in his mother.   ROS:  Please see the history of present illness.   All other systems are personally reviewed and negative.   Exam:   BP 120/72   Pulse 78   Wt 78.4 kg (172 lb 12.8 oz)   SpO2 98%   BMI 23.44 kg/m   Wt Readings from Last 3 Encounters:  12/04/19 78.4 kg (172 lb 12.8 oz)  08/08/19 78.2 kg (172 lb 6.4 oz)  06/25/19 80.1 kg (176 lb 9.6 oz)   General:  Well appearing. No resp difficulty HEENT: normal Neck: supple. no JVD. Carotids 2+ bilat; no bruits. No lymphadenopathy or thryomegaly appreciated. Cor: PMI nondisplaced. Regular  rate & rhythm. No rubs, gallops or murmurs. Lungs: clear Abdomen: soft, nontender, nondistended. No hepatosplenomegaly. No bruits or masses. Good bowel sounds. Extremities: no cyanosis, clubbing, rash, edema Neuro: alert & orientedx3, cranial nerves grossly intact. moves all 4 extremities w/o difficulty. Affect pleasant  EKG: NSR 76 bpm QT 498/QTc 550 ms   Recent Labs: 05/12/2019: B Natriuretic Peptide 469.5; TSH 2.746 08/08/2019: Hemoglobin 11.8; Magnesium 2.3; Platelets 305 08/18/2019: BUN 26; Creatinine, Ser 1.60; Potassium 4.4; Sodium 130  Personally reviewed   Wt Readings from Last 3 Encounters:  12/04/19 78.4 kg (172 lb 12.8 oz)  08/08/19 78.2 kg (172 lb 6.4 oz)  06/25/19 80.1 kg (  176 lb 9.6 oz)     ASSESSMENT AND PLAN:  1. Chronic diastolic CHF: Echo in 4/40 with EF 50-55%, severe biatrial enlargement.  He remains volume overloaded on exam.  NYHA class II symptoms. -Continue torsemide to 80 qam/60 qpm.  Continue KCl 60 qam/40 qpm.  -Continue spironolactone 12.5 mg daily. He is intolerant higher dose.   - With atrial fibrillation and prominent diastolic CHF, cardiac amyloidosis is a concern.  Myeloma panel was negative.  PYP scan recommended and we discussed cardiac amyloidosis.  He is still concerned about coming in for a study during the COVID-19 epidemic.  He will let me know when he is comfortable to have the study done. If creatinine remains stable and PYP is abnormal, a cardiac MRI can also be done (creatinine has been borderline for gadolinium).    2. CAD: PCI to RCA, had stent thrombosis back in 2000.  No chest paoin.   - No ASA given Xarelto use.  - Continue pravastatin, good lipids in 9/20.  3. Atrial fibrillation: Paroxysmal.  Remains NSR today. He remains on Tikosyn 125 mcg bid. QTc on ECG is 560 ms . He denies new QT prolonging medications.  - I discussed with Dr Aundra Dubin and Doristine Devoid NP/A fib. - Check BMET/Mag today. If low will replaced. If K and Mag ok will need  to stop tikosyn.   - If he has further breakthrough atrial fibrillation with Tikosyn use, need to consider atrial fibrillation ablation.  - Continue Toprol XL 25 mg daily.  4. Microcytic anemia: Fe deficiency.  Hgb down to 7.6 in 8/20.  I am uncertain about the cause for this.  No overt GI bleeding.  Patient thinks that it came from extensive bruising after a fall, but this seems to be an excessive hgb fall for bruising to create.  He never had GI follow up.  - Check CBC today.   Follow up in 3 months and repeat ECHO at that  Time.  Today his K 4 and Mag 2.5 ---> will need to stop tikosyn and refer back to A fib clinic.    Jeanmarie Hubert, NP  12/04/2019   [facility] [place] [address] [phone] (office) [phone] (fax)

## 2019-12-04 NOTE — Telephone Encounter (Signed)
-----   Message from Conrad Adrian, NP sent at 12/04/2019  3:49 PM EST ----- K and MAg stable. Will prolonged QTc will need to stop tikosyn. Needs appointment with A fib in 2-3 weeks.  Hgb is stable. No change. Please call.

## 2019-12-11 ENCOUNTER — Other Ambulatory Visit: Payer: Self-pay | Admitting: Cardiovascular Disease

## 2019-12-12 ENCOUNTER — Other Ambulatory Visit (HOSPITAL_COMMUNITY): Payer: Self-pay | Admitting: Cardiology

## 2019-12-16 ENCOUNTER — Encounter (HOSPITAL_COMMUNITY): Payer: Self-pay | Admitting: Nurse Practitioner

## 2019-12-16 ENCOUNTER — Ambulatory Visit (HOSPITAL_BASED_OUTPATIENT_CLINIC_OR_DEPARTMENT_OTHER)
Admission: RE | Admit: 2019-12-16 | Discharge: 2019-12-16 | Disposition: A | Discharge status: Home / Self Care | Payer: PPO | Source: Ambulatory Visit | Attending: Nurse Practitioner | Admitting: Nurse Practitioner

## 2019-12-16 ENCOUNTER — Other Ambulatory Visit: Payer: Self-pay

## 2019-12-16 VITALS — BP 96/70 | HR 118 | Ht 72.0 in | Wt 172.8 lb

## 2019-12-16 DIAGNOSIS — I2510 Atherosclerotic heart disease of native coronary artery without angina pectoris: Secondary | ICD-10-CM | POA: Diagnosis present

## 2019-12-16 DIAGNOSIS — D123 Benign neoplasm of transverse colon: Secondary | ICD-10-CM | POA: Diagnosis present

## 2019-12-16 DIAGNOSIS — K254 Chronic or unspecified gastric ulcer with hemorrhage: Secondary | ICD-10-CM | POA: Diagnosis not present

## 2019-12-16 DIAGNOSIS — I252 Old myocardial infarction: Secondary | ICD-10-CM | POA: Insufficient documentation

## 2019-12-16 DIAGNOSIS — I959 Hypotension, unspecified: Secondary | ICD-10-CM | POA: Diagnosis present

## 2019-12-16 DIAGNOSIS — Z79899 Other long term (current) drug therapy: Secondary | ICD-10-CM | POA: Insufficient documentation

## 2019-12-16 DIAGNOSIS — Q211 Atrial septal defect: Secondary | ICD-10-CM | POA: Diagnosis not present

## 2019-12-16 DIAGNOSIS — I4892 Unspecified atrial flutter: Secondary | ICD-10-CM | POA: Diagnosis not present

## 2019-12-16 DIAGNOSIS — I4819 Other persistent atrial fibrillation: Secondary | ICD-10-CM | POA: Diagnosis not present

## 2019-12-16 DIAGNOSIS — I25118 Atherosclerotic heart disease of native coronary artery with other forms of angina pectoris: Secondary | ICD-10-CM | POA: Diagnosis not present

## 2019-12-16 DIAGNOSIS — R197 Diarrhea, unspecified: Secondary | ICD-10-CM | POA: Insufficient documentation

## 2019-12-16 DIAGNOSIS — N179 Acute kidney failure, unspecified: Secondary | ICD-10-CM | POA: Diagnosis not present

## 2019-12-16 DIAGNOSIS — N1832 Chronic kidney disease, stage 3b: Secondary | ICD-10-CM | POA: Diagnosis present

## 2019-12-16 DIAGNOSIS — I509 Heart failure, unspecified: Secondary | ICD-10-CM | POA: Diagnosis not present

## 2019-12-16 DIAGNOSIS — E7800 Pure hypercholesterolemia, unspecified: Secondary | ICD-10-CM | POA: Diagnosis not present

## 2019-12-16 DIAGNOSIS — I340 Nonrheumatic mitral (valve) insufficiency: Secondary | ICD-10-CM | POA: Diagnosis not present

## 2019-12-16 DIAGNOSIS — E871 Hypo-osmolality and hyponatremia: Secondary | ICD-10-CM | POA: Diagnosis not present

## 2019-12-16 DIAGNOSIS — Z7901 Long term (current) use of anticoagulants: Secondary | ICD-10-CM | POA: Diagnosis not present

## 2019-12-16 DIAGNOSIS — M7020 Olecranon bursitis, unspecified elbow: Secondary | ICD-10-CM | POA: Diagnosis present

## 2019-12-16 DIAGNOSIS — K5731 Diverticulosis of large intestine without perforation or abscess with bleeding: Secondary | ICD-10-CM | POA: Diagnosis not present

## 2019-12-16 DIAGNOSIS — R0602 Shortness of breath: Secondary | ICD-10-CM | POA: Diagnosis not present

## 2019-12-16 DIAGNOSIS — I5031 Acute diastolic (congestive) heart failure: Secondary | ICD-10-CM | POA: Diagnosis not present

## 2019-12-16 DIAGNOSIS — K257 Chronic gastric ulcer without hemorrhage or perforation: Secondary | ICD-10-CM | POA: Diagnosis not present

## 2019-12-16 DIAGNOSIS — K2971 Gastritis, unspecified, with bleeding: Secondary | ICD-10-CM | POA: Diagnosis not present

## 2019-12-16 DIAGNOSIS — K635 Polyp of colon: Secondary | ICD-10-CM | POA: Diagnosis not present

## 2019-12-16 DIAGNOSIS — D120 Benign neoplasm of cecum: Secondary | ICD-10-CM | POA: Diagnosis present

## 2019-12-16 DIAGNOSIS — I5043 Acute on chronic combined systolic (congestive) and diastolic (congestive) heart failure: Secondary | ICD-10-CM | POA: Diagnosis not present

## 2019-12-16 DIAGNOSIS — I480 Paroxysmal atrial fibrillation: Secondary | ICD-10-CM | POA: Insufficient documentation

## 2019-12-16 DIAGNOSIS — Z20822 Contact with and (suspected) exposure to covid-19: Secondary | ICD-10-CM | POA: Diagnosis not present

## 2019-12-16 DIAGNOSIS — E785 Hyperlipidemia, unspecified: Secondary | ICD-10-CM | POA: Diagnosis present

## 2019-12-16 DIAGNOSIS — G4733 Obstructive sleep apnea (adult) (pediatric): Secondary | ICD-10-CM | POA: Diagnosis present

## 2019-12-16 DIAGNOSIS — K5730 Diverticulosis of large intestine without perforation or abscess without bleeding: Secondary | ICD-10-CM | POA: Diagnosis not present

## 2019-12-16 DIAGNOSIS — I110 Hypertensive heart disease with heart failure: Secondary | ICD-10-CM | POA: Insufficient documentation

## 2019-12-16 DIAGNOSIS — R5381 Other malaise: Secondary | ICD-10-CM | POA: Diagnosis not present

## 2019-12-16 DIAGNOSIS — I130 Hypertensive heart and chronic kidney disease with heart failure and stage 1 through stage 4 chronic kidney disease, or unspecified chronic kidney disease: Secondary | ICD-10-CM | POA: Diagnosis not present

## 2019-12-16 DIAGNOSIS — I50810 Right heart failure, unspecified: Secondary | ICD-10-CM | POA: Diagnosis not present

## 2019-12-16 DIAGNOSIS — R791 Abnormal coagulation profile: Secondary | ICD-10-CM | POA: Diagnosis present

## 2019-12-16 DIAGNOSIS — Z87891 Personal history of nicotine dependence: Secondary | ICD-10-CM | POA: Insufficient documentation

## 2019-12-16 DIAGNOSIS — K259 Gastric ulcer, unspecified as acute or chronic, without hemorrhage or perforation: Secondary | ICD-10-CM | POA: Diagnosis not present

## 2019-12-16 DIAGNOSIS — D62 Acute posthemorrhagic anemia: Secondary | ICD-10-CM | POA: Diagnosis not present

## 2019-12-16 DIAGNOSIS — D649 Anemia, unspecified: Secondary | ICD-10-CM | POA: Diagnosis not present

## 2019-12-16 DIAGNOSIS — I4891 Unspecified atrial fibrillation: Secondary | ICD-10-CM | POA: Diagnosis not present

## 2019-12-16 DIAGNOSIS — I361 Nonrheumatic tricuspid (valve) insufficiency: Secondary | ICD-10-CM | POA: Diagnosis not present

## 2019-12-16 DIAGNOSIS — I5032 Chronic diastolic (congestive) heart failure: Secondary | ICD-10-CM | POA: Diagnosis not present

## 2019-12-16 DIAGNOSIS — K2950 Unspecified chronic gastritis without bleeding: Secondary | ICD-10-CM | POA: Diagnosis not present

## 2019-12-16 DIAGNOSIS — E860 Dehydration: Secondary | ICD-10-CM | POA: Diagnosis present

## 2019-12-16 DIAGNOSIS — D6869 Other thrombophilia: Secondary | ICD-10-CM | POA: Diagnosis not present

## 2019-12-16 DIAGNOSIS — K2970 Gastritis, unspecified, without bleeding: Secondary | ICD-10-CM | POA: Diagnosis not present

## 2019-12-16 DIAGNOSIS — K922 Gastrointestinal hemorrhage, unspecified: Secondary | ICD-10-CM | POA: Diagnosis present

## 2019-12-16 DIAGNOSIS — K648 Other hemorrhoids: Secondary | ICD-10-CM | POA: Diagnosis present

## 2019-12-16 DIAGNOSIS — E875 Hyperkalemia: Secondary | ICD-10-CM | POA: Diagnosis present

## 2019-12-16 DIAGNOSIS — K921 Melena: Secondary | ICD-10-CM | POA: Diagnosis not present

## 2019-12-16 NOTE — Progress Notes (Signed)
Primary Care Physician: Patient, No Pcp Per Referring Physician: Dr. Aundra Dubin  EP: Dr. Aleen Sells Larry is a 79 y.o. [name] with a h/o CAD, HTN, CHF, paroxysmal afib that was on Tikosyn, recently stopped due to a qtc of 550 ms, and only on 125 mcg bid. He is in the afib clinic to further discuss options. He has gone back into what appears to be an atrial flutter with a variable block  at 117 bpm. He is unaware of being out of rhythm.   He also tells me that he started having watery diarrhea last night. He has slowed down today but still having loose stools. No abdominal pain, fever or chills. He feels weak today. BP soft at 96/70.   Today, he denies symptoms of palpitations, chest pain, shortness of breath, orthopnea, PND, lower extremity edema, dizziness, presyncope, syncope, or neurologic sequela.+ for diarrhea. The patient is tolerating medications without difficulties and is otherwise without complaint today.   Past Medical History:  Diagnosis Date  . Coronary artery disease    status post remote PCI of the mid to distal RCA 11/1998 followed by acute ST elevation MI inferiorly 08/1999 with occlusion of the prior RCA stent as well as 70% diagonal branch.  He underwent PCI of the RCA  . Hypercholesterolemia   . Hypertension   . Myocardial infarct ([place])   . Olecranon bursitis    Past Surgical History:  Procedure Laterality Date  . CARDIAC CATHETERIZATION  12/15/1999   EF was 55%   . CORONARY ANGIOPLASTY WITH STENT PLACEMENT  08/15/1999   CAD, status post prior stenting of the mid to distal RCA in 11/1998, with an acute diaphragmatic wall infarction due to total occlusion at the stent site/There is also 70% narrowing in the diagonal branch of the LAD/The LV showed inferior wall hypo- akinesis.-- Successful reperfusion, percutaneous transluminal coronary percutaneous transluminal coronary & placement of a 2nd overlying stent in RCA    Current Outpatient Medications  Medication Sig  Dispense Refill  . acetaminophen (TYLENOL) 500 MG tablet Take 500 mg by mouth every 6 (six) hours as needed.    . loratadine (CLARITIN) 10 MG tablet Take 10 mg by mouth daily.    . metoprolol succinate (TOPROL XL) 25 MG 24 hr tablet Take 1 tablet (25 mg total) by mouth daily. 30 tablet 11  . nitroGLYCERIN (NITROSTAT) 0.4 MG SL tablet Place 1 tablet (0.4 mg total) under the tongue every 5 (five) minutes as needed for chest pain (3 doses max). 25 tablet 3  . potassium chloride (K-DUR) 20 MEQ tablet Take 3 tablets (60 mEq total) by mouth every morning AND 2 tablets (40 mEq total) every evening. 150 tablet 3  . pravastatin (PRAVACHOL) 40 MG tablet Take 1 tablet (40 mg total) by mouth every evening. 90 tablet 0  . spironolactone (ALDACTONE) 25 MG tablet Take 0.5 tablets (12.5 mg total) by mouth daily. 45 tablet 3  . torsemide (DEMADEX) 20 MG tablet Take 4 tablets (80 mg total) by mouth every morning AND 3 tablets (60 mg total) every evening. 210 tablet 5  . XARELTO 20 MG TABS tablet TAKE ONE TABLET (20 MG TOTAL) BY MOUTH DAILY WITH SUPPER 30 tablet 5   No current facility-administered medications for this encounter.    Allergies  Allergen Reactions  . Plavix [Clopidogrel Bisulfate] Anaphylaxis         Social History   Socioeconomic History  . Marital status: Married    Spouse name: scarlett  .  Number of children: 3  . Years of education: 68  . Highest education level: Not on file  Occupational History  . Occupation: retired  Tobacco Use  . Smoking status: Former Smoker    Types: Cigarettes    Quit date: 11/02/1999    Years since quitting: 20.1  . Smokeless tobacco: Never Used  Substance and Sexual Activity  . Alcohol use: No  . Drug use: No  . Sexual activity: Not on file  Other Topics Concern  . Not on file  Social History Narrative  . Not on file   Social Determinants of Health   Financial Resource Strain:   . Difficulty of Paying Living Expenses: Not on file  Food  Insecurity:   . Worried About Charity fundraiser in the Last Year: Not on file  . Ran Out of Food in the Last Year: Not on file  Transportation Needs:   . Lack of Transportation (Medical): Not on file  . Lack of Transportation (Non-Medical): Not on file  Physical Activity:   . Days of Exercise per Week: Not on file  . Minutes of Exercise per Session: Not on file  Stress:   . Feeling of Stress : Not on file  Social Connections:   . Frequency of Communication with Friends and Family: Not on file  . Frequency of Social Gatherings with Friends and Family: Not on file  . Attends Religious Services: Not on file  . Active Member of Clubs or Organizations: Not on file  . Attends Archivist Meetings: Not on file  . Marital Status: Not on file  Intimate Partner Violence:   . Fear of Current or Ex-Partner: Not on file  . Emotionally Abused: Not on file  . Physically Abused: Not on file  . Sexually Abused: Not on file    Family History  Problem Relation Age of Onset  . Heart Problems Mother   . Cancer Father     ROS- All systems are reviewed and negative except as per the HPI above  Physical Exam: Vitals:   12/16/19 1511  BP: 96/70  Pulse: (!) 118  Weight: 78.4 kg  Height: 6' (1.829 m)   Wt Readings from Last 3 Encounters:  12/16/19 78.4 kg  12/04/19 78.4 kg  08/08/19 78.2 kg    Labs: Lab Results  Component Value Date   NA 132 (L) 12/04/2019   K 4.0 12/04/2019   CL 98 12/04/2019   CO2 22 12/04/2019   GLUCOSE 102 (H) 12/04/2019   BUN 27 (H) 12/04/2019   CREATININE 1.84 (H) 12/04/2019   CALCIUM 8.6 (L) 12/04/2019   MG 2.5 (H) 12/04/2019   No results found for: INR Lab Results  Component Value Date   CHOL 93 06/25/2019   HDL 28 (L) 06/25/2019   LDLCALC 57 06/25/2019   TRIG 40 06/25/2019     GEN- The patient is well appearing, alert and oriented x 3 today.   Head- normocephalic, atraumatic Eyes-  Sclera clear, conjunctiva pink Ears- hearing  intact Oropharynx- clear Neck- supple, no JVP Lymph- no cervical lymphadenopathy Lungs- Clear to ausculation bilaterally, normal work of breathing Heart- fast irregular rate and rhythm, no murmurs, rubs or gallops, PMI not laterally displaced GI- soft, NT, ND, + BS Extremities- no clubbing, cyanosis, or edema MS- no significant deformity or atrophy Skin- no rash or lesion Psych- euthymic mood, full affect Neuro- strength and sensation are intact  EKG- atrial flutter with variable block at 118 bpm.  Assessment and Plan: 1. Paroxysmal afib Now off tikosyn for long qt of 550 ms and is out of rhythm I discussed with Dr. Rayann Heman and Dr. Aundra Dubin I will get an appointment with Dr. Rayann Heman to discuss ablation vrs amiodarone Will update echo and get him a appointment asap to discuss options  I can not increase rate control today as I think he may be dehydrated due to diarrhea with increased HR and soft BP  2. Diarrhea Denies fever or abdominal pain/blood  I have asked him to hold diuretics tonight and cut in half in the am if diarrhea continues and soft BP I have asked him to increase water intake  We suggested going to the ER today but he declined  Told the wife to have a low threshold to take him to ER if continues with diarrhea   I will see back on Friday   Quaneisha Hanisch C. Fitzhugh Vizcarrondo, [facility] [address] [place], [place] [address] [phone]

## 2019-12-16 NOTE — Patient Instructions (Signed)
Hold toresmide tonight. Do half dose tomorrow morning if blood pressure has not improved. Once BP has improved return to normal dosing

## 2019-12-18 ENCOUNTER — Telehealth (HOSPITAL_COMMUNITY): Payer: Self-pay | Admitting: *Deleted

## 2019-12-18 NOTE — Telephone Encounter (Signed)
Patients wife calls with report of continued hypotension 92/74 HR 121 93/68 HR 116 feeling weak but denies shortness of breath, dizziness, chest pain or lightheadedness. Wife unsure if he should take metoprolol as prescribed. Encouraged increasing fluid intake with recent fluid loss due to food poisoning/diarrhea that just subsided last night. Pt is just now starting back on fluids/food today. They will work on pushing fluids and attempt to take metoprolol this evening as long as SBP over 100. ER precautions reviewed again with wife. Pt very reluctant to go to ER due to fear of covid/long wait.

## 2019-12-19 ENCOUNTER — Inpatient Hospital Stay (HOSPITAL_COMMUNITY)
Admission: EM | Admit: 2019-12-19 | Discharge: 2019-12-30 | DRG: 377 | Disposition: A | Discharge status: Home Health | Payer: PPO | Source: Ambulatory Visit | Attending: Student | Admitting: Student

## 2019-12-19 ENCOUNTER — Other Ambulatory Visit: Payer: Self-pay

## 2019-12-19 ENCOUNTER — Emergency Department (HOSPITAL_COMMUNITY): Payer: PPO

## 2019-12-19 ENCOUNTER — Ambulatory Visit (HOSPITAL_COMMUNITY)
Admission: RE | Admit: 2019-12-19 | Discharge: 2019-12-19 | Disposition: A | Discharge status: Home / Self Care | Payer: PPO | Source: Ambulatory Visit | Attending: Internal Medicine | Admitting: Internal Medicine

## 2019-12-19 ENCOUNTER — Ambulatory Visit (HOSPITAL_COMMUNITY)
Admission: RE | Admit: 2019-12-19 | Discharge: 2019-12-19 | Disposition: A | Discharge status: Home / Self Care | Payer: PPO | Source: Ambulatory Visit | Attending: Nurse Practitioner | Admitting: Nurse Practitioner

## 2019-12-19 ENCOUNTER — Encounter (HOSPITAL_COMMUNITY): Payer: Self-pay

## 2019-12-19 VITALS — BP 92/50 | HR 102 | Ht 72.0 in | Wt 169.4 lb

## 2019-12-19 DIAGNOSIS — K648 Other hemorrhoids: Secondary | ICD-10-CM | POA: Diagnosis present

## 2019-12-19 DIAGNOSIS — G4733 Obstructive sleep apnea (adult) (pediatric): Secondary | ICD-10-CM | POA: Diagnosis present

## 2019-12-19 DIAGNOSIS — D62 Acute posthemorrhagic anemia: Secondary | ICD-10-CM | POA: Diagnosis present

## 2019-12-19 DIAGNOSIS — I2510 Atherosclerotic heart disease of native coronary artery without angina pectoris: Secondary | ICD-10-CM | POA: Diagnosis present

## 2019-12-19 DIAGNOSIS — I959 Hypotension, unspecified: Secondary | ICD-10-CM | POA: Diagnosis present

## 2019-12-19 DIAGNOSIS — I5032 Chronic diastolic (congestive) heart failure: Secondary | ICD-10-CM | POA: Diagnosis not present

## 2019-12-19 DIAGNOSIS — I4892 Unspecified atrial flutter: Secondary | ICD-10-CM

## 2019-12-19 DIAGNOSIS — Z79899 Other long term (current) drug therapy: Secondary | ICD-10-CM

## 2019-12-19 DIAGNOSIS — Z955 Presence of coronary angioplasty implant and graft: Secondary | ICD-10-CM

## 2019-12-19 DIAGNOSIS — I4819 Other persistent atrial fibrillation: Secondary | ICD-10-CM | POA: Diagnosis present

## 2019-12-19 DIAGNOSIS — N184 Chronic kidney disease, stage 4 (severe): Secondary | ICD-10-CM | POA: Diagnosis present

## 2019-12-19 DIAGNOSIS — R791 Abnormal coagulation profile: Secondary | ICD-10-CM | POA: Diagnosis present

## 2019-12-19 DIAGNOSIS — M7020 Olecranon bursitis, unspecified elbow: Secondary | ICD-10-CM | POA: Diagnosis present

## 2019-12-19 DIAGNOSIS — N179 Acute kidney failure, unspecified: Secondary | ICD-10-CM | POA: Diagnosis present

## 2019-12-19 DIAGNOSIS — N1832 Chronic kidney disease, stage 3b: Secondary | ICD-10-CM | POA: Diagnosis present

## 2019-12-19 DIAGNOSIS — K5731 Diverticulosis of large intestine without perforation or abscess with bleeding: Secondary | ICD-10-CM | POA: Diagnosis present

## 2019-12-19 DIAGNOSIS — I5043 Acute on chronic combined systolic (congestive) and diastolic (congestive) heart failure: Secondary | ICD-10-CM | POA: Diagnosis present

## 2019-12-19 DIAGNOSIS — I509 Heart failure, unspecified: Secondary | ICD-10-CM | POA: Diagnosis not present

## 2019-12-19 DIAGNOSIS — I10 Essential (primary) hypertension: Secondary | ICD-10-CM | POA: Diagnosis present

## 2019-12-19 DIAGNOSIS — Z20822 Contact with and (suspected) exposure to covid-19: Secondary | ICD-10-CM | POA: Diagnosis present

## 2019-12-19 DIAGNOSIS — I25118 Atherosclerotic heart disease of native coronary artery with other forms of angina pectoris: Secondary | ICD-10-CM | POA: Diagnosis not present

## 2019-12-19 DIAGNOSIS — R197 Diarrhea, unspecified: Secondary | ICD-10-CM

## 2019-12-19 DIAGNOSIS — R531 Weakness: Secondary | ICD-10-CM | POA: Insufficient documentation

## 2019-12-19 DIAGNOSIS — K2971 Gastritis, unspecified, with bleeding: Secondary | ICD-10-CM | POA: Diagnosis present

## 2019-12-19 DIAGNOSIS — I252 Old myocardial infarction: Secondary | ICD-10-CM

## 2019-12-19 DIAGNOSIS — E785 Hyperlipidemia, unspecified: Secondary | ICD-10-CM | POA: Diagnosis present

## 2019-12-19 DIAGNOSIS — I4891 Unspecified atrial fibrillation: Secondary | ICD-10-CM | POA: Diagnosis present

## 2019-12-19 DIAGNOSIS — I130 Hypertensive heart and chronic kidney disease with heart failure and stage 1 through stage 4 chronic kidney disease, or unspecified chronic kidney disease: Secondary | ICD-10-CM | POA: Diagnosis present

## 2019-12-19 DIAGNOSIS — Z7901 Long term (current) use of anticoagulants: Secondary | ICD-10-CM

## 2019-12-19 DIAGNOSIS — E860 Dehydration: Secondary | ICD-10-CM | POA: Diagnosis present

## 2019-12-19 DIAGNOSIS — K257 Chronic gastric ulcer without hemorrhage or perforation: Secondary | ICD-10-CM | POA: Diagnosis not present

## 2019-12-19 DIAGNOSIS — D120 Benign neoplasm of cecum: Secondary | ICD-10-CM | POA: Diagnosis present

## 2019-12-19 DIAGNOSIS — I4430 Unspecified atrioventricular block: Secondary | ICD-10-CM | POA: Diagnosis present

## 2019-12-19 DIAGNOSIS — I340 Nonrheumatic mitral (valve) insufficiency: Secondary | ICD-10-CM | POA: Diagnosis not present

## 2019-12-19 DIAGNOSIS — D123 Benign neoplasm of transverse colon: Secondary | ICD-10-CM | POA: Diagnosis present

## 2019-12-19 DIAGNOSIS — Z888 Allergy status to other drugs, medicaments and biological substances status: Secondary | ICD-10-CM

## 2019-12-19 DIAGNOSIS — E875 Hyperkalemia: Secondary | ICD-10-CM | POA: Diagnosis present

## 2019-12-19 DIAGNOSIS — T45515A Adverse effect of anticoagulants, initial encounter: Secondary | ICD-10-CM | POA: Diagnosis present

## 2019-12-19 DIAGNOSIS — K922 Gastrointestinal hemorrhage, unspecified: Secondary | ICD-10-CM | POA: Diagnosis present

## 2019-12-19 DIAGNOSIS — I50810 Right heart failure, unspecified: Secondary | ICD-10-CM | POA: Diagnosis not present

## 2019-12-19 DIAGNOSIS — E7800 Pure hypercholesterolemia, unspecified: Secondary | ICD-10-CM | POA: Diagnosis not present

## 2019-12-19 DIAGNOSIS — I361 Nonrheumatic tricuspid (valve) insufficiency: Secondary | ICD-10-CM | POA: Diagnosis not present

## 2019-12-19 DIAGNOSIS — Q211 Atrial septal defect: Secondary | ICD-10-CM

## 2019-12-19 DIAGNOSIS — E871 Hypo-osmolality and hyponatremia: Secondary | ICD-10-CM | POA: Diagnosis present

## 2019-12-19 DIAGNOSIS — D649 Anemia, unspecified: Secondary | ICD-10-CM | POA: Diagnosis not present

## 2019-12-19 DIAGNOSIS — Z8249 Family history of ischemic heart disease and other diseases of the circulatory system: Secondary | ICD-10-CM

## 2019-12-19 DIAGNOSIS — I5031 Acute diastolic (congestive) heart failure: Secondary | ICD-10-CM | POA: Diagnosis not present

## 2019-12-19 DIAGNOSIS — K254 Chronic or unspecified gastric ulcer with hemorrhage: Secondary | ICD-10-CM | POA: Diagnosis present

## 2019-12-19 DIAGNOSIS — R5381 Other malaise: Secondary | ICD-10-CM | POA: Diagnosis not present

## 2019-12-19 DIAGNOSIS — Z87891 Personal history of nicotine dependence: Secondary | ICD-10-CM

## 2019-12-19 DIAGNOSIS — K2950 Unspecified chronic gastritis without bleeding: Secondary | ICD-10-CM | POA: Diagnosis not present

## 2019-12-19 LAB — CBC
HCT: 20.1 % — ABNORMAL LOW (ref 39.0–52.0)
HCT: 21.1 % — ABNORMAL LOW (ref 39.0–52.0)
Hemoglobin: 5.8 g/dL — CL (ref 13.0–17.0)
Hemoglobin: 6.5 g/dL — CL (ref 13.0–17.0)
MCH: 24.5 pg — ABNORMAL LOW (ref 26.0–34.0)
MCH: 25.8 pg — ABNORMAL LOW (ref 26.0–34.0)
MCHC: 28.9 g/dL — ABNORMAL LOW (ref 30.0–36.0)
MCHC: 30.8 g/dL (ref 30.0–36.0)
MCV: 84.8 fL (ref 80.0–100.0)
MCV: 83.7 fL (ref 80.0–100.0)
Platelets: 336 10*3/uL (ref 150–400)
Platelets: 279 10*3/uL (ref 150–400)
RBC: 2.37 MIL/uL — ABNORMAL LOW (ref 4.22–5.81)
RBC: 2.52 MIL/uL — ABNORMAL LOW (ref 4.22–5.81)
RDW: 18.5 % — ABNORMAL HIGH (ref 11.5–15.5)
RDW: 18 % — ABNORMAL HIGH (ref 11.5–15.5)
WBC: 14.5 10*3/uL — ABNORMAL HIGH (ref 4.0–10.5)
WBC: 10.5 10*3/uL (ref 4.0–10.5)
nRBC: 1.3 % — ABNORMAL HIGH (ref 0.0–0.2)
nRBC: 1.8 % — ABNORMAL HIGH (ref 0.0–0.2)

## 2019-12-19 LAB — DIFFERENTIAL
Abs Immature Granulocytes: 0.12 10*3/uL — ABNORMAL HIGH (ref 0.00–0.07)
Basophils Absolute: 0 10*3/uL (ref 0.0–0.1)
Basophils Relative: 0 %
Eosinophils Absolute: 0 10*3/uL (ref 0.0–0.5)
Eosinophils Relative: 0 %
Immature Granulocytes: 1 %
Lymphocytes Relative: 6 %
Lymphs Abs: 0.9 10*3/uL (ref 0.7–4.0)
Monocytes Absolute: 2 10*3/uL — ABNORMAL HIGH (ref 0.1–1.0)
Monocytes Relative: 14 %
Neutro Abs: 11.4 10*3/uL — ABNORMAL HIGH (ref 1.7–7.7)
Neutrophils Relative %: 79 %

## 2019-12-19 LAB — HEPATIC FUNCTION PANEL
ALT: 13 U/L (ref 0–44)
AST: 19 U/L (ref 15–41)
Albumin: 3 g/dL — ABNORMAL LOW (ref 3.5–5.0)
Alkaline Phosphatase: 101 U/L (ref 38–126)
Bilirubin, Direct: 0.9 mg/dL — ABNORMAL HIGH (ref 0.0–0.2)
Indirect Bilirubin: 1.2 mg/dL — ABNORMAL HIGH (ref 0.3–0.9)
Total Bilirubin: 2.1 mg/dL — ABNORMAL HIGH (ref 0.3–1.2)
Total Protein: 6.1 g/dL — ABNORMAL LOW (ref 6.5–8.1)

## 2019-12-19 LAB — BASIC METABOLIC PANEL
Anion gap: 16 — ABNORMAL HIGH (ref 5–15)
BUN: 83 mg/dL — ABNORMAL HIGH (ref 8–23)
CO2: 19 mmol/L — ABNORMAL LOW (ref 22–32)
Calcium: 9.1 mg/dL (ref 8.9–10.3)
Chloride: 94 mmol/L — ABNORMAL LOW (ref 98–111)
Creatinine, Ser: 2.2 mg/dL — ABNORMAL HIGH (ref 0.61–1.24)
GFR calc Af Amer: 33 mL/min — ABNORMAL LOW (ref >60)
GFR calc non Af Amer: 28 mL/min — ABNORMAL LOW (ref >60)
Glucose, Bld: 133 mg/dL — ABNORMAL HIGH (ref 70–99)
Potassium: 5.6 mmol/L — ABNORMAL HIGH (ref 3.5–5.1)
Sodium: 129 mmol/L — ABNORMAL LOW (ref 135–145)

## 2019-12-19 LAB — ABO/RH: ABO/RH(D): A POS

## 2019-12-19 LAB — PROTIME-INR
INR: 4.1 (ref 0.8–1.2)
Prothrombin Time: 39.4 seconds — ABNORMAL HIGH (ref 11.4–15.2)

## 2019-12-19 LAB — POC OCCULT BLOOD, ED: Fecal Occult Bld: POSITIVE — AB

## 2019-12-19 LAB — BRAIN NATRIURETIC PEPTIDE: B Natriuretic Peptide: 192.2 pg/mL — ABNORMAL HIGH (ref 0.0–100.0)

## 2019-12-19 LAB — PREPARE RBC (CROSSMATCH)

## 2019-12-19 LAB — SARS CORONAVIRUS 2 (TAT 6-24 HRS): SARS Coronavirus 2: NEGATIVE

## 2019-12-19 MED ORDER — METOPROLOL SUCCINATE ER 25 MG PO TB24
25.0000 mg | ORAL_TABLET | Freq: Every day | ORAL | Status: DC
  Filled 2019-12-19 (×3): qty 1

## 2019-12-19 MED ORDER — SODIUM CHLORIDE 0.9% FLUSH: 3.0000 mL | Freq: Once | INTRAVENOUS | Status: DC

## 2019-12-19 MED ORDER — ONDANSETRON HCL 4 MG/2ML IJ SOLN
4.0000 mg | Freq: Four times a day (QID) | INTRAMUSCULAR | Status: DC | PRN
  Administered 2019-12-28: 4 mg via INTRAVENOUS

## 2019-12-19 MED ORDER — AMIODARONE HCL IN DEXTROSE 360-4.14 MG/200ML-% IV SOLN
30.0000 mg/h | INTRAVENOUS | Status: DC
  Administered 2019-12-20 – 2019-12-22 (×5): 30 mg/h via INTRAVENOUS
  Filled 2019-12-19 (×5): qty 200

## 2019-12-19 MED ORDER — PHYTONADIONE 5 MG PO TABS: 2.5000 mg | ORAL_TABLET | Freq: Once | ORAL | Status: DC

## 2019-12-19 MED ORDER — PANTOPRAZOLE SODIUM 40 MG IV SOLR
40.0000 mg | Freq: Two times a day (BID) | INTRAVENOUS | Status: DC
  Administered 2019-12-19 – 2019-12-30 (×21): 40 mg via INTRAVENOUS
  Filled 2019-12-19 (×21): qty 40

## 2019-12-19 MED ORDER — AMIODARONE LOAD VIA INFUSION
150.0000 mg | Freq: Once | INTRAVENOUS | Status: DC
  Filled 2019-12-19: qty 83.34

## 2019-12-19 MED ORDER — SODIUM CHLORIDE 0.9% IV SOLUTION: Freq: Once | INTRAVENOUS | Status: DC

## 2019-12-19 MED ORDER — ACETAMINOPHEN 325 MG PO TABS: 650.0000 mg | ORAL_TABLET | Freq: Four times a day (QID) | ORAL | Status: DC | PRN

## 2019-12-19 MED ORDER — SODIUM CHLORIDE 0.9 % IV BOLUS
500.0000 mL | Freq: Once | INTRAVENOUS | Status: AC
  Administered 2019-12-19: 500 mL via INTRAVENOUS

## 2019-12-19 MED ORDER — ACETAMINOPHEN 650 MG RE SUPP: 650.0000 mg | Freq: Four times a day (QID) | RECTAL | Status: DC | PRN

## 2019-12-19 MED ORDER — VITAMIN K1 10 MG/ML IJ SOLN
10.0000 mg | Freq: Once | INTRAMUSCULAR | Status: AC
  Administered 2019-12-19: 10 mg via SUBCUTANEOUS
  Filled 2019-12-19: qty 1

## 2019-12-19 MED ORDER — AMIODARONE HCL IN DEXTROSE 360-4.14 MG/200ML-% IV SOLN
60.0000 mg/h | INTRAVENOUS | Status: DC
  Administered 2019-12-19 (×2): 60 mg/h via INTRAVENOUS
  Filled 2019-12-19 (×2): qty 200

## 2019-12-19 MED ORDER — ONDANSETRON HCL 4 MG PO TABS: 4.0000 mg | ORAL_TABLET | Freq: Four times a day (QID) | ORAL | Status: DC | PRN

## 2019-12-19 MED ORDER — PRAVASTATIN SODIUM 40 MG PO TABS
40.0000 mg | ORAL_TABLET | Freq: Every evening | ORAL | Status: DC
  Administered 2019-12-19 – 2019-12-29 (×11): 40 mg via ORAL
  Filled 2019-12-19 (×11): qty 1

## 2019-12-19 MED ORDER — PANTOPRAZOLE SODIUM 40 MG IV SOLR
40.0000 mg | Freq: Once | INTRAVENOUS | Status: AC
  Administered 2019-12-19: 40 mg via INTRAVENOUS
  Filled 2019-12-19: qty 40

## 2019-12-19 NOTE — ED Notes (Signed)
Report given to rn on 3e 

## 2019-12-19 NOTE — ED Notes (Signed)
Patient called wife and updated her

## 2019-12-19 NOTE — ED Provider Notes (Signed)
[place] EMER[facility]ENCY DEPARTMENT Provider Note   CSN: [id number] Arrival date & time: 12/19/19  1245     History Chief Complaint  Patient presents with  . Atrial Fibrillation    Larry [name] is a 79 y.o. male presenting for evaluation of weakness.   Pt states he has been feeling weak since he started to have diarrhea 3 days ago.  Patient states he had 2 to 3 days of diarrhea.  Initially stools were very watery, but then he states there is maybe some blood in there.  He went to the A. fib clinic today, was thought to be dehydrated and sent to the ER.  He denies chest pain, chest fluttering, shortness breath, nausea, vomiting, abdominal pain, urinary symptoms.  He states he is no longer having abnormal bowel movements/diarrhea.  Patient states he takes Xarelto, last dose was yesterday.  He did not take it Tuesday and Wednesday when he was having diarrhea.  Additional history obtained from chart review.  Reviewed cardiology notes.  Patient's Tikosyn was stopped last week due to prolonged QT.  They are considering ablation.  He is currently not on any rate control.  Additional history of CAD, hypertension, CHF, A. fib/flutter.  HPI     Past Medical History:  Diagnosis Date  . Coronary artery disease    status post remote PCI of the mid to distal RCA 11/1998 followed by acute ST elevation MI inferiorly 08/1999 with occlusion of the prior RCA stent as well as 70% diagonal branch.  He underwent PCI of the RCA  . Hypercholesterolemia   . Hypertension   . Myocardial infarct ([place])   . Olecranon bursitis     Patient Active Problem List   Diagnosis Date Noted  . Visit for monitoring Tikosyn therapy 05/14/2018  . CHF (congestive heart failure) ([place]) 03/23/2018  . Anasarca 03/23/2018  . Acute CHF (congestive heart failure) ([place]) 03/23/2018  . Acute kidney injury (nontraumatic) ([place])   . Acute diastolic CHF (congestive heart failure) ([place])   . Atrial fibrillation with RVR  ([place])   . Acute CHF ([place]) 02/19/2018  . Olecranon bursitis   . Myocardial infarct ([place])   . Hypertension   . Hypercholesterolemia   . Coronary artery disease   . Atrial flutter ([place]) 11/11/2012  . HYPERCHOLESTEROLEMIA 11/04/2009  . Coronary atherosclerosis 11/04/2009    Past Surgical History:  Procedure Laterality Date  . CARDIAC CATHETERIZATION  12/15/1999   EF was 55%   . CORONARY ANGIOPLASTY WITH STENT PLACEMENT  08/15/1999   CAD, status post prior stenting of the mid to distal RCA in 11/1998, with an acute diaphragmatic wall infarction due to total occlusion at the stent site/There is also 70% narrowing in the diagonal branch of the LAD/The LV showed inferior wall hypo- akinesis.-- Successful reperfusion, percutaneous transluminal coronary percutaneous transluminal coronary & placement of a 2nd overlying stent in RCA       Family History  Problem Relation Age of Onset  . Heart Problems Mother   . Cancer Father     Social History   Tobacco Use  . Smoking status: Former Smoker    Types: Cigarettes    Quit date: 11/02/1999    Years since quitting: 20.1  . Smokeless tobacco: Never Used  Substance Use Topics  . Alcohol use: No  . Drug use: No    Home Medications Prior to Admission medications   Medication Sig Start Date End Date Taking? Authorizing Provider  acetaminophen (TYLENOL) 500 MG tablet Take  500 mg by mouth every 6 (six) hours as needed.    [provider]  loratadine (CLARITIN) 10 MG tablet Take 10 mg by mouth daily.    [provider]  metoprolol succinate (TOPROL XL) 25 MG 24 hr tablet Take 1 tablet (25 mg total) by mouth daily. 06/25/19 06/24/20  Larey Dresser, MD  nitroGLYCERIN (NITROSTAT) 0.4 MG SL tablet Place 1 tablet (0.4 mg total) under the tongue every 5 (five) minutes as needed for chest pain (3 doses max). 02/19/18   Imogene Burn, PA-C  potassium chloride (K-DUR) 20 MEQ tablet Take 3 tablets (60 mEq total) by mouth every  morning AND 2 tablets (40 mEq total) every evening. 06/25/19   Larey Dresser, MD  pravastatin (PRAVACHOL) 40 MG tablet Take 1 tablet (40 mg total) by mouth every evening. 12/04/19   Clegg, Amy D, NP  spironolactone (ALDACTONE) 25 MG tablet Take 0.5 tablets (12.5 mg total) by mouth daily. 12/12/19   Larey Dresser, MD  torsemide (DEMADEX) 20 MG tablet Take 4 tablets (80 mg total) by mouth every morning AND 3 tablets (60 mg total) every evening. 08/08/19   Larey Dresser, MD  XARELTO 20 MG TABS tablet TAKE ONE TABLET (20 MG TOTAL) BY MOUTH DAILY WITH SUPPER 05/05/19   Bensimhon, Shaune Pascal, MD    Allergies    Plavix [clopidogrel bisulfate]  Review of Systems   Review of Systems  Neurological: Positive for weakness.  Hematological: Bruises/bleeds easily.  All other systems reviewed and are negative.   Physical Exam Updated Vital Signs BP 98/76 (BP Location: Left Arm)   Pulse 98   Temp 97.9 F (36.6 C) (Oral)   Resp 18   SpO2 90%   Physical Exam Vitals and nursing note reviewed.  Constitutional:      Appearance: He is well-developed. He is ill-appearing.     Comments: Appears ill and pale  HENT:     Head: Normocephalic and atraumatic.  Eyes:     Conjunctiva/sclera: Conjunctivae normal.     Pupils: Pupils are equal, round, and reactive to light.  Cardiovascular:     Rate and Rhythm: Tachycardia present. Rhythm irregular.     Comments: Heart rate around 110.  Irregular Pulmonary:     Effort: Pulmonary effort is normal. No respiratory distress.     Breath sounds: Normal breath sounds. No wheezing.  Abdominal:     General: There is no distension.     Palpations: Abdomen is soft. There is no mass.     Tenderness: There is no abdominal tenderness. There is no guarding or rebound.  Genitourinary:    Rectum: Guaiac result positive.     Comments: Black stools.  Hemoccult positive. Musculoskeletal:        General: Normal range of motion.     Cervical back: Normal range of motion  and neck supple.  Skin:    General: Skin is warm and dry.     Capillary Refill: Capillary refill takes less than 2 seconds.     Coloration: Skin is pale.  Neurological:     Mental Status: He is alert and oriented to person, place, and time.     ED Results / Procedures / Treatments   Labs (all labs ordered are listed, but only abnormal results are displayed) Labs Reviewed  BASIC METABOLIC PANEL - Abnormal; Notable for the following components:      Result Value   Sodium 129 (*)    Potassium 5.6 (*)  Chloride 94 (*)    CO2 19 (*)    Glucose, Bld 133 (*)    BUN 83 (*)    Creatinine, Ser 2.20 (*)    GFR calc non Af Amer 28 (*)    GFR calc Af Amer 33 (*)    Anion gap 16 (*)    All other components within normal limits  CBC - Abnormal; Notable for the following components:   WBC 14.5 (*)    RBC 2.37 (*)    Hemoglobin 5.8 (*)    HCT 20.1 (*)    MCH 24.5 (*)    MCHC 28.9 (*)    RDW 18.5 (*)    nRBC 1.3 (*)    All other components within normal limits  PROTIME-INR - Abnormal; Notable for the following components:   Prothrombin Time 39.4 (*)    INR 4.1 (*)    All other components within normal limits  DIFFERENTIAL - Abnormal; Notable for the following components:   Neutro Abs 11.4 (*)    Monocytes Absolute 2.0 (*)    Abs Immature Granulocytes 0.12 (*)    All other components within normal limits  BRAIN NATRIURETIC PEPTIDE - Abnormal; Notable for the following components:   B Natriuretic Peptide 192.2 (*)    All other components within normal limits  POC OCCULT BLOOD, ED - Abnormal; Notable for the following components:   Fecal Occult Bld POSITIVE (*)    All other components within normal limits  SARS CORONAVIRUS 2 (TAT 6-24 HRS)  HEPATIC FUNCTION PANEL  I-STAT ARTERIAL BLOOD GAS, ED  TYPE AND SCREEN  PREPARE RBC (CROSSMATCH)  ABO/RH    EKG None  Radiology DG Chest Portable 1 View  Result Date: 12/19/2019 CLINICAL DATA:  Shortness of breath, weakness EXAM:  PORTABLE CHEST 1 VIEW COMPARISON:  03/22/2018 FINDINGS: Mild cardiomegaly, unchanged. Calcific aortic knob. Mild pulmonary vascular congestion. Small right pleural effusion. Probable trace left pleural effusion. No focal airspace consolidation. No pneumothorax. Degenerative changes of the shoulders. IMPRESSION: Cardiomegaly with mild pulmonary vascular congestion and small right pleural effusion. Electronically Signed   By: Davina Poke D.O.   On: 12/19/2019 13:24    Procedures .Critical Care Performed by: Franchot Heidelberg, PA-C Authorized by: Franchot Heidelberg, PA-C   Critical care provider statement:    Critical care time (minutes):  50   Critical care time was exclusive of:  Separately billable procedures and treating other patients and teaching time   Critical care was necessary to treat or prevent imminent or life-threatening deterioration of the following conditions:  Circulatory failure   Critical care was time spent personally by me on the following activities:  Development of treatment plan with patient or surrogate, blood draw for specimens, discussions with consultants, evaluation of patient's response to treatment, examination of patient, obtaining history from patient or surrogate, ordering and performing treatments and interventions, ordering and review of laboratory studies, ordering and review of radiographic studies, pulse oximetry, re-evaluation of patient's condition and review of old charts   I assumed direction of critical care for this patient from another provider in my specialty: no   Comments:     Patient with critically low hemoglobin requiring transfusion.  He will need admission to the hospital.   (including critical care time)  Medications Ordered in ED Medications  sodium chloride flush (NS) 0.9 % injection 3 mL (3 mLs Intravenous Not Given 12/19/19 1325)  0.9 %  sodium chloride infusion (Manually program via Guardrails IV Fluids) (has no administration in  time  range)  sodium chloride 0.9 % bolus 500 mL (0 mLs Intravenous Stopped 12/19/19 1404)  pantoprazole (PROTONIX) injection 40 mg (40 mg Intravenous Given 12/19/19 1445)    ED Course  I have reviewed the triage vital signs and the nursing notes.  Pertinent labs & imaging results that were available during my care of the patient were reviewed by me and considered in my medical decision making (see chart for details).    MDM Rules/Calculators/A&P                      Pt presenting for evaluation of weakness.  Physical exam shows patient appears ill and pale.  He is tachycardic and in A. fib/flutter.  Blood pressures are soft.  Concern for dehydration versus anemia.  Less likely vital sign changes due to cardiac arrhythmia, as heart rate is maintaining around 110.  Will obtain labs, x-ray, EKG, and Hemoccult.  Rectal exam shows black stool.  Hemoccult positive.  Chest x-ray viewed interpreted by me, shows cardiomegaly and right pleural effusion.  As such, will add on BNP.  EKG shows afib.   Labs interpreted by me, shows critically low hemoglobin at 5.8.  INR elevated at 4.1, but this is likely falsely elevated due to patient being on a DOAC.  Discussed with pharmacy, who does not recommend vitamin K at this time.  Will start blood transfusion.  BUN is elevated, and with black stools I have suspicion for upper GI bleed.  He is on Xarelto.  Will call GI for consult.  Discussed with Dr. Therisa Doyne from gastroenterology who will evaluate the patient.  Gust with Dr. Lorin Mercy and [facility] hospitalist service, patient to be admitted.   Final Clinical Impression(s) / ED Diagnoses Final diagnoses:  Anemia, unspecified type  Gastrointestinal hemorrhage, unspecified gastrointestinal hemorrhage type  Atrial fibrillation with RVR ([place])  Hyperkalemia    Rx / DC Orders ED Discharge Orders    None       Franchot Heidelberg, PA-C 12/19/19 1510    Malvin Johns, MD 12/19/19 1521

## 2019-12-19 NOTE — Progress Notes (Addendum)
  Pt was in today for EKG prior to echo, to see if HR was controlled enough to have this done. Tikosyn was stopped 2 weeks for long qtc of 550 ms . He was to be considered for ablation, with appointment scheduled with Dr. Rayann Heman on Monday. When I saw pt on Tuesday, he had returned to afib and  was having diarrhea that started Monday night. I encouraged him to go to the ER then and he deferred. Wife called yesterday with low BP and elevated HR and again was encouraged to go to the ER. He states diarrhea stopped last night.Has seen a little blood in diarrhea.No abdominal pain, no vomiting.poor po intake. Denied blood on Tuesday.  He just started back diuretic (1/2 dose) last night. He is extremely weak to the point he can barley  walk. BP  92/50 and pt is very pale, poor color/ jaundiced looking.Marland Kitchen HGB 10.1 2/25. EKG shows afib at 102 bpm. He has been holding BB for his low BP. Wife states that he missed a xarelto Wednesday PM. Has not had covid shots, "worried about what might happen long term from shot."  After long discussion, I finally got him and wife to agree to go to the ER. Charge nurse notified.    Geroge Baseman Keonte [place], [facility] [address] [place], [place] [address] [phone]

## 2019-12-19 NOTE — Consult Note (Addendum)
Cardiology Consultation:   Patient ID: Larry [name]; [id number]; Jun 01, 1941   Admit date: 12/19/2019 Date of Consult: 12/19/2019  Primary Care Provider: Patient, No Pcp Per Primary Cardiologist: [name], MD Primary Electrophysiologist:  None   Patient Profile:   Larry [name] is a 79 y.o. male with a PMH of CAD s/p PCI to RCA in 2000 with subsequent STEMI later that year for ISR managed with repeat PCI to RCA, chronic diastolic CHF, paroxysmal atrial fibrillation/flutter, HTN, HLD, OSA on CPAP, and CKD stage 3 who is being seen today for the evaluation of atrial fibrillation/flutter at the request of Dr. Lorin Mercy.  History of Present Illness:   Larry [name] has a PMH of CAD s/p PCI to RCA in 2000 with subsequent STEMI later that year for ISR managed with repeat PCI to RCA, chronic diastolic CHF, paroxysmal atrial fibrillation/flutter, HTN, HLD, OSA on CPAP, and CKD stage 3.  He was last evaluated by advanced heart failure at an outpatient visit with Darrick Grinder 12/04/19, at which time he was noted to be volume overloaded. There was concern for cardiac amyloidosis, though patient was hesitant to undergo a PYP scan in an effort to limit his COVID-19 exposure. He has been following with the atrial fibrillation clinic, last seen by Roderic Palau 12/16/19, at which time he reported watery diarrhea starting 12/15/19 which he felt was improving, though BP's were soft in the 90s/70s. He was back in atrial flutter with variable AV block at that visit after his tikosyn was discontinued 12/04/19 due to prolonged QT. He was scheduled to see Dr. Rayann Heman 12/22/19 to discuss starting amiodarone vs ablation. He was recommended to present to the ER at that time given c/f dehydration with diarrhea though he declined at that time. On EKG visit today he was noted to be weak and pale, with some blood in his stool. He was finally agreeable to getting checked out in the ED.  In the ED he has been persistently tachycardic to  the 110s, hypotensive with BP 80s-100s/50-70s, intermittently tachypneic to the 20s, satting well on O2 via [place], and afebrile. He was found to have an acute GI bleed on arrival with a +stool quaiac and Hgb 5.8. Additionally labs were notable for Na 129, K 5.6, Cr 2.2 (baseline 1.5), WBC 14.5, PLT 336, INR 4.1, AST/ALT wnl, Tbili 2.1, and BNP 192. EKG revealed atrial flutter with variable AV block with HR 102.  At the time of this evaluation he is feeling okay. Diarrhea resolved yesterday but still feeling quite weak. He noted frank blood in his stool Tuesday/Wednesday. No complaints of chest pain, SOB, LE edema, dizziness, lightheadedness, or syncope. Of note, patient missed a dose of xarelto on Wednesday (3/10).   Past Medical History:  Diagnosis Date  . Coronary artery disease    status post remote PCI of the mid to distal RCA 11/1998 followed by acute ST elevation MI inferiorly 08/1999 with occlusion of the prior RCA stent as well as 70% diagonal branch.  He underwent PCI of the RCA  . Hypercholesterolemia   . Hypertension   . Myocardial infarct ([place])   . Olecranon bursitis     Past Surgical History:  Procedure Laterality Date  . CARDIAC CATHETERIZATION  12/15/1999   EF was 55%   . CORONARY ANGIOPLASTY WITH STENT PLACEMENT  08/15/1999   CAD, status post prior stenting of the mid to distal RCA in 11/1998, with an acute diaphragmatic wall infarction due to total occlusion at the stent site/There is  also 70% narrowing in the diagonal branch of the LAD/The LV showed inferior wall hypo- akinesis.-- Successful reperfusion, percutaneous transluminal coronary percutaneous transluminal coronary & placement of a 2nd overlying stent in RCA     Home Medications:  Prior to Admission medications   Medication Sig Start Date End Date Taking? Authorizing Provider  acetaminophen (TYLENOL) 500 MG tablet Take 1,000 mg by mouth as needed for moderate pain (back pain).    Yes [provider]    loratadine (CLARITIN) 10 MG tablet Take 10 mg by mouth daily.   Yes [provider]  metoprolol succinate (TOPROL XL) 25 MG 24 hr tablet Take 1 tablet (25 mg total) by mouth daily. 06/25/19 06/24/20 Yes Larey Dresser, MD  nitroGLYCERIN (NITROSTAT) 0.4 MG SL tablet Place 1 tablet (0.4 mg total) under the tongue every 5 (five) minutes as needed for chest pain (3 doses max). 02/19/18  Yes Imogene Burn, PA-C  potassium chloride (K-DUR) 20 MEQ tablet Take 3 tablets (60 mEq total) by mouth every morning AND 2 tablets (40 mEq total) every evening. 06/25/19  Yes Larey Dresser, MD  pravastatin (PRAVACHOL) 40 MG tablet Take 1 tablet (40 mg total) by mouth every evening. 12/04/19  Yes Clegg, Amy D, NP  spironolactone (ALDACTONE) 25 MG tablet Take 0.5 tablets (12.5 mg total) by mouth daily. 12/12/19  Yes Larey Dresser, MD  torsemide (DEMADEX) 20 MG tablet Take 4 tablets (80 mg total) by mouth every morning AND 3 tablets (60 mg total) every evening. Patient taking differently: Take 3 tablets (60 mg total) by mouth every morning AND 2 tablets (40 mg total) every evening. 08/08/19  Yes McLean, Elby Showers, MD  XARELTO 20 MG TABS tablet TAKE ONE TABLET (20 MG TOTAL) BY MOUTH DAILY WITH SUPPER Patient taking differently: Take 20 mg by mouth daily with supper.  05/05/19  Yes Bensimhon, Shaune Pascal, MD    Inpatient Medications: Scheduled Meds: . sodium chloride   Intravenous Once  . [START ON 12/20/2019] metoprolol succinate  25 mg Oral Daily  . pantoprazole (PROTONIX) IV  40 mg Intravenous Q12H  . pravastatin  40 mg Oral QPM   Continuous Infusions:  PRN Meds: acetaminophen **OR** acetaminophen, ondansetron **OR** ondansetron (ZOFRAN) IV  Allergies:    Allergies  Allergen Reactions  . Plavix [Clopidogrel Bisulfate] Anaphylaxis         Social History:   Social History   Socioeconomic History  . Marital status: Married    Spouse name: scarlett  . Number of children: 3  . Years of  education: 2  . Highest education level: Not on file  Occupational History  . Occupation: retired  Tobacco Use  . Smoking status: Former Smoker    Types: Cigarettes    Quit date: 11/02/1999    Years since quitting: 20.1  . Smokeless tobacco: Never Used  Substance and Sexual Activity  . Alcohol use: No  . Drug use: No  . Sexual activity: Not on file  Other Topics Concern  . Not on file  Social History Narrative  . Not on file   Social Determinants of Health   Financial Resource Strain:   . Difficulty of Paying Living Expenses:   Food Insecurity:   . Worried About Charity fundraiser in the Last Year:   . Arboriculturist in the Last Year:   Transportation Needs:   . Film/video editor (Medical):   Marland Kitchen Lack of Transportation (Non-Medical):   Physical Activity:   .  Days of Exercise per Week:   . Minutes of Exercise per Session:   Stress:   . Feeling of Stress :   Social Connections:   . Frequency of Communication with Friends and Family:   . Frequency of Social Gatherings with Friends and Family:   . Attends Religious Services:   . Active Member of Clubs or Organizations:   . Attends Archivist Meetings:   Marland Kitchen Marital Status:   Intimate Partner Violence:   . Fear of Current or Ex-Partner:   . Emotionally Abused:   Marland Kitchen Physically Abused:   . Sexually Abused:     Family History:    Family History  Problem Relation Age of Onset  . Heart Problems Mother   . Cancer Father      ROS:  Please see the history of present illness.  ROS  All other ROS reviewed and negative.     Physical Exam/Data:   Vitals:   12/19/19 1530 12/19/19 1539 12/19/19 1545 12/19/19 1600  BP: 97/69 100/63 97/63 90/61   Pulse:  (!) 110    Resp: 19 18 (!) 22 15  Temp:  (!) 97.4 F (36.3 C)    TempSrc:  Oral    SpO2:  100%      Intake/Output Summary (Last 24 hours) at 12/19/2019 1709 Last data filed at 12/19/2019 1541 Gross per 24 hour  Intake 300 ml  Output --  Net 300 ml    There were no vitals filed for this visit. There is no height or weight on file to calculate BMI.  General:  Well nourished, well developed, in no acute distress HEENT: sclera anicteric, pale mucus membranes  Neck: no JVD Vascular: No carotid bruits; distal pulses 2+ bilaterally Cardiac:  normal S1, S2; RRR; no murmurs, rubs, or gallops Lungs:  clear to auscultation bilaterally, no wheezing, rhonchi or rales  Abd: NABS, soft, nontender, no hepatomegaly Ext: 1+ ankle edema Musculoskeletal:  No deformities, BUE and BLE strength normal and equal Skin: warm and dry  Neuro:  CNs 2-12 intact, no focal abnormalities noted Psych:  Normal affect   EKG:  The EKG was personally reviewed and demonstrates:  Atrial flutter with variable AV block, rate 102, non-specific ST-T wave abnormalities, no STE/D Telemetry:  Telemetry was personally reviewed and demonstrates:  Atrial flutter with variable AV block, rates 110s  Relevant CV Studies: Echocardiogram 2019: Study Conclusions   - Left ventricle: The cavity size was normal. Wall thickness was  normal. Systolic function was at the lower limits of normal. The  estimated ejection fraction was in the range of 50% to 55%.  Severe hypokinesis of the basalinferolateral, inferior, and  inferoseptal myocardium; consistent with infarction in the  distribution of the right coronary artery.  - Mitral valve: Calcified annulus.  - Left atrium: The atrium was severely dilated.  - Right atrium: The atrium was severely dilated.  - Pulmonary arteries: Systolic pressure was mildly increased. PA  peak pressure: 33 mm Hg (S).  - Pericardium, extracardiac: A small pericardial effusion was  identified circumferential to the heart. There was no evidence of  hemodynamic compromise.   Laboratory Data:  Chemistry Recent Labs  Lab 12/19/19 1300  NA 129*  K 5.6*  CL 94*  CO2 19*  GLUCOSE 133*  BUN 83*  CREATININE 2.20*  CALCIUM 9.1   GFRNONAA 28*  GFRAA 33*  ANIONGAP 16*    Recent Labs  Lab 12/19/19 1500  PROT 6.1*  ALBUMIN 3.0*  AST 19  ALT 13  ALKPHOS 101  BILITOT 2.1*   Hematology Recent Labs  Lab 12/19/19 1300  WBC 14.5*  RBC 2.37*  HGB 5.8*  HCT 20.1*  MCV 84.8  MCH 24.5*  MCHC 28.9*  RDW 18.5*  PLT 336   Cardiac EnzymesNo results for input(s): TROPONINI in the last 168 hours. No results for input(s): TROPIPOC in the last 168 hours.  BNP Recent Labs  Lab 12/19/19 1404  BNP 192.2*    DDimer No results for input(s): DDIMER in the last 168 hours.  Radiology/Studies:  DG Chest Portable 1 View  Result Date: 12/19/2019 CLINICAL DATA:  Shortness of breath, weakness EXAM: PORTABLE CHEST 1 VIEW COMPARISON:  03/22/2018 FINDINGS: Mild cardiomegaly, unchanged. Calcific aortic knob. Mild pulmonary vascular congestion. Small right pleural effusion. Probable trace left pleural effusion. No focal airspace consolidation. No pneumothorax. Degenerative changes of the shoulders. IMPRESSION: Cardiomegaly with mild pulmonary vascular congestion and small right pleural effusion. Electronically Signed   By: Davina Poke D.O.   On: 12/19/2019 13:24    Assessment and Plan:   1. Atrial flutter with RVR: known history. Recently stopped Tikosyn due to QT prolongation. Has been back in atrial flutter since at least 12/16/19. He is historically unaware of his atrial fib/flutter. BP has been soft limiting rate control over the past several days due to underlying diarrhea related to acute GI bleed.  - Can continue metoprolol as BP will allow - Favor starting amiodarone for rate/rhythm control given hypotension - Continue to hold xarelto until cleared by GI to resume.   2. Acute GI bleed: he notes frank blood in stool Tuesday/Wednesday this week. No recurrent diarrhea yesterday or today. Hbg 5.8 on admission. Stool guaiac positive. Receiving 1 uPRBC now - Continue to monitor Hgb closely and replete to maintain >8 given  CAD history - Continue to hold xarelto - Continue to monitor volume status closely with transfusions  3. Chronic diastolic CHF: volume status appears stable. Likely dry due to recent diarrhea. Cr up from baseline. BNP in the 100s. - Favor holding torsemide today - reassess need for diuresis in AM - Monitor volume status closely with transfusions and volume repletion - Home spironolactone on hold given hypotension  4. CAD s/p PCI to RCA in 2000: no complaints of chest pain/SOB. Not on aspirin given need for anticoagulation - Continue statin  5. HLD: - Continue statin.  6. CKD stage 3: Cr up to 2.2 from baseline 1.5. Likely 2/2 dehydration with recent diarrhea/GI bleed - Continue to monitor closely     For questions or updates, please contact [facility] Please consult [email or web address] for contact info under Cardiology/STEMI.   Signed, Abigail Butts, PA-C  12/19/2019 5:09 PM [phone]   Patient seen and examined. Agree with assessment and plan.  Larry [name] is a 79 year old gentleman who is followed by Drs. Already McLain.  He has known CAD and underwent remote PCI to his RCA in 2000 requiring substitute repeat intervention later that year for in-stent restenosis.  He has a history of chronic diastolic heart failure, hypertension, hyperlipidemia, as well as paroxysmal atrial fibrillation/flutter and chronic kidney disease.  He had developed prolonged QT interval on Tikosyn leading to its discontinuance.  There was some initial concern for possible cardiac amyloid and never had a PYP scan.  This past week, the patient began to notice loose stool with blood.  He was seen in A. fib clinic by Roderic Palau on December 16, 2019 and again today with progressive fatigability.  ECG revealed atrial flutter with variable block and was tachycardic around 100 to 110 bpm.  His diarrhea resolved yesterday.  He has not had any anginal symptoms.  Presently, blood pressure is 99/60, slightly  improved from 90/60 previously.  He is getting packed red blood cell transfusion, heart rate is 112 bpm.  There is no JVD.  There are no rales.  Rhythm is irregular there is a 1/6 systolic murmur.  Abdomen is soft and nontender.  He had trace ankle edema bilaterally.  Neurologically he was grossly nonfocal.  He had normal affect and mood.  We will admit the patient.  We will initiate amiodarone for rate control and possible pharmacologic cardioversion particularly since he is hypotensive and would not tolerate increasing beta-blocker or initiation of Cardizem.  Hemoglobin on admission was 5.8 with hematocrit 20.1, stool guaiac positive, he is receiving packed red blood cell.  With his CAD history, he may require additional unit to maintain hemoglobin greater than 7-8.  We will hold Xarelto, last dose was yesterday, although he may have missed his dose on March 10.  With INR 4.1, vitamin K was recommended by GI.  Follow-up electrolytes with potassium at 5.6 and BUN/creatinine 83/2.20.  Magnesium is 2.5.  Marland Kitchen  Troy Sine, MD, [facility] [place] 12/19/2019 5:38 PM

## 2019-12-19 NOTE — Consult Note (Signed)
[facility] Consult  Referring Provider: Dr. Malvin Johns Primary Care Physician:  Patient, No Pcp Per Primary Gastroenterologist: Althia Forts  Reason for Consultation: Anemia in the setting of rectal bleeding  HPI: Larry [name] is a 79 y.o. male with history of A. fib (on Xarelto), CAD, HTN, and diastolic CHF presenting with diarrhea and rectal bleeding.  Patient states he started having diarrhea on Tuesday 3/9.  He was having approximately 4 episodes of diarrhea per day accompanied by some bright red blood in the toilet bowl and around the stool.  However he does not believe the bleeding was mixed in with the stools, and he does not think the bleeding was a large amount.  He states that his bowel movements have decreased, and he has not had an episode of diarrhea since yesterday.  He has had no bowel movements today.  He denies black stools but does note that his stools have been dark due to the iron supplementation he has been on.  He states he has never had any rectal bleeding prior to this.  He denies abdominal pain, heartburn, dysphagia, nausea, vomiting.  He has noticed his appetite has decreased a little bit over the last few months.  He denies unexplained weight loss but does believe he lost about 3 pounds over the last few days because he has not been eating due to diarrhea.  He is on Xarelto for A. fib, last dose was yesterday 3/11.  He was previously on Tikosyn, but it was stopped approximately 2 weeks due to QT prolongation (550 ms).  He denies chest pain or shortness of breath.  Patient notes he has started feeling weak over the last few days but denies any episodes of presyncope or syncope.  He states he did not feel weak or tired prior to onset of diarrhea.  He notes his systolic blood pressure at home typically runs in the 16X with diastolic of 09U.  Today, hemglobin is 5.8, decreased from 10.12 weeks ago.  PT 39.4 with INR of 4.1.  He denies any family history of  gastrointestinal malignancies.  He has never had a colonoscopy or an EGD.   Past Medical History:  Diagnosis Date  . Coronary artery disease    status post remote PCI of the mid to distal RCA 11/1998 followed by acute ST elevation MI inferiorly 08/1999 with occlusion of the prior RCA stent as well as 70% diagonal branch.  He underwent PCI of the RCA  . Hypercholesterolemia   . Hypertension   . Myocardial infarct ([place])   . Olecranon bursitis     Past Surgical History:  Procedure Laterality Date  . CARDIAC CATHETERIZATION  12/15/1999   EF was 55%   . CORONARY ANGIOPLASTY WITH STENT PLACEMENT  08/15/1999   CAD, status post prior stenting of the mid to distal RCA in 11/1998, with an acute diaphragmatic wall infarction due to total occlusion at the stent site/There is also 70% narrowing in the diagonal branch of the LAD/The LV showed inferior wall hypo- akinesis.-- Successful reperfusion, percutaneous transluminal coronary percutaneous transluminal coronary & placement of a 2nd overlying stent in RCA    Prior to Admission medications   Medication Sig Start Date End Date Taking? Authorizing Provider  acetaminophen (TYLENOL) 500 MG tablet Take 500 mg by mouth every 6 (six) hours as needed.    [provider]  loratadine (CLARITIN) 10 MG tablet Take 10 mg by mouth daily.    [provider]  metoprolol succinate (TOPROL XL) 25  MG 24 hr tablet Take 1 tablet (25 mg total) by mouth daily. 06/25/19 06/24/20  Larey Dresser, MD  nitroGLYCERIN (NITROSTAT) 0.4 MG SL tablet Place 1 tablet (0.4 mg total) under the tongue every 5 (five) minutes as needed for chest pain (3 doses max). 02/19/18   Imogene Burn, PA-C  potassium chloride (K-DUR) 20 MEQ tablet Take 3 tablets (60 mEq total) by mouth every morning AND 2 tablets (40 mEq total) every evening. 06/25/19   Larey Dresser, MD  pravastatin (PRAVACHOL) 40 MG tablet Take 1 tablet (40 mg total) by mouth every evening. 12/04/19   Clegg,  Amy D, NP  spironolactone (ALDACTONE) 25 MG tablet Take 0.5 tablets (12.5 mg total) by mouth daily. 12/12/19   Larey Dresser, MD  torsemide (DEMADEX) 20 MG tablet Take 4 tablets (80 mg total) by mouth every morning AND 3 tablets (60 mg total) every evening. 08/08/19   Larey Dresser, MD  XARELTO 20 MG TABS tablet TAKE ONE TABLET (20 MG TOTAL) BY MOUTH DAILY WITH SUPPER 05/05/19   Bensimhon, Shaune Pascal, MD    Current Facility-Administered Medications  Medication Dose Route Frequency Provider Last Rate Last Admin  . 0.9 %  sodium chloride infusion (Manually program via Guardrails IV Fluids)   Intravenous Once Caccavale, Sophia, PA-C      . sodium chloride flush (NS) 0.9 % injection 3 mL  3 mL Intravenous Once Malvin Johns, MD       Current Outpatient Medications  Medication Sig Dispense Refill  . acetaminophen (TYLENOL) 500 MG tablet Take 500 mg by mouth every 6 (six) hours as needed.    . loratadine (CLARITIN) 10 MG tablet Take 10 mg by mouth daily.    . metoprolol succinate (TOPROL XL) 25 MG 24 hr tablet Take 1 tablet (25 mg total) by mouth daily. 30 tablet 11  . nitroGLYCERIN (NITROSTAT) 0.4 MG SL tablet Place 1 tablet (0.4 mg total) under the tongue every 5 (five) minutes as needed for chest pain (3 doses max). 25 tablet 3  . potassium chloride (K-DUR) 20 MEQ tablet Take 3 tablets (60 mEq total) by mouth every morning AND 2 tablets (40 mEq total) every evening. 150 tablet 3  . pravastatin (PRAVACHOL) 40 MG tablet Take 1 tablet (40 mg total) by mouth every evening. 90 tablet 0  . spironolactone (ALDACTONE) 25 MG tablet Take 0.5 tablets (12.5 mg total) by mouth daily. 45 tablet 3  . torsemide (DEMADEX) 20 MG tablet Take 4 tablets (80 mg total) by mouth every morning AND 3 tablets (60 mg total) every evening. 210 tablet 5  . XARELTO 20 MG TABS tablet TAKE ONE TABLET (20 MG TOTAL) BY MOUTH DAILY WITH SUPPER 30 tablet 5    Allergies as of 12/19/2019 - Review Complete 12/19/2019  Allergen  Reaction Noted  . Plavix [clopidogrel bisulfate] Anaphylaxis 11/02/2011    Family History  Problem Relation Age of Onset  . Heart Problems Mother   . Cancer Father     Social History   Socioeconomic History  . Marital status: Married    Spouse name: scarlett  . Number of children: 3  . Years of education: 26  . Highest education level: Not on file  Occupational History  . Occupation: retired  Tobacco Use  . Smoking status: Former Smoker    Types: Cigarettes    Quit date: 11/02/1999    Years since quitting: 20.1  . Smokeless tobacco: Never Used  Substance and Sexual Activity  .  Alcohol use: No  . Drug use: No  . Sexual activity: Not on file  Other Topics Concern  . Not on file  Social History Narrative  . Not on file   Social Determinants of Health   Financial Resource Strain:   . Difficulty of Paying Living Expenses:   Food Insecurity:   . Worried About Charity fundraiser in the Last Year:   . Arboriculturist in the Last Year:   Transportation Needs:   . Film/video editor (Medical):   Marland Kitchen Lack of Transportation (Non-Medical):   Physical Activity:   . Days of Exercise per Week:   . Minutes of Exercise per Session:   Stress:   . Feeling of Stress :   Social Connections:   . Frequency of Communication with Friends and Family:   . Frequency of Social Gatherings with Friends and Family:   . Attends Religious Services:   . Active Member of Clubs or Organizations:   . Attends Archivist Meetings:   Marland Kitchen Marital Status:   Intimate Partner Violence:   . Fear of Current or Ex-Partner:   . Emotionally Abused:   Marland Kitchen Physically Abused:   . Sexually Abused:     Review of Systems: As noted in HPI.   Physical Exam: Vital signs in last 24 hours: Temp:  [97.9 F (36.6 C)] 97.9 F (36.6 C) (03/12 1254) Pulse Rate:  [98-102] 98 (03/12 1254) Resp:  [18] 18 (03/12 1254) BP: (87-98)/(50-76) 98/76 (03/12 1312) SpO2:  [90 %] 90 % (03/12 1313) Weight:  [76.8  kg] 76.8 kg (03/12 1216)    General:   Alert,  pleasant and cooperative in NAD Head:  Normocephalic and atraumatic. Eyes:  Sclera clear, no icterus.   Conjunctival pallor. Ears:  Normal auditory acuity. Nose:  No deformity, discharge,  or lesions. Mouth:  No deformity or lesions.  Oropharynx pink & moist. Neck:  Supple; no masses or thyromegaly. Lungs:  Clear throughout to auscultation.   No wheezes, crackles, or rhonchi.  No acute distress but patient did have desaturations into the 80s when talking. Heart: Tachycardic (HR 110-115), irregularly irregular; no murmurs, clicks, rubs,  or gallops. Extremities:  Without clubbing or edema. Neurologic:  Alert and  oriented x4;  grossly normal neurologically. Skin:  Intact without significant lesions or rashes. Psych:  Alert and cooperative. Normal mood and affect. Abdomen:  Soft, nontender and nondistended. No masses, hepatosplenomegaly or hernias noted. Normal bowel sounds, without guarding, and without rebound.         Lab Results: Recent Labs    12/19/19 1300  WBC 14.5*  HGB 5.8*  HCT 20.1*  PLT 336   BMET Recent Labs    12/19/19 1300  NA 129*  K 5.6*  CL 94*  CO2 19*  GLUCOSE 133*  BUN 83*  CREATININE 2.20*  CALCIUM 9.1   LFT No results for input(s): PROT, ALBUMIN, AST, ALT, ALKPHOS, BILITOT, BILIDIR, IBILI in the last 72 hours. PT/INR Recent Labs    12/19/19 1300  LABPROT 39.4*  INR 4.1*    Studies/Results: DG Chest Portable 1 View  Result Date: 12/19/2019 CLINICAL DATA:  Shortness of breath, weakness EXAM: PORTABLE CHEST 1 VIEW COMPARISON:  03/22/2018 FINDINGS: Mild cardiomegaly, unchanged. Calcific aortic knob. Mild pulmonary vascular congestion. Small right pleural effusion. Probable trace left pleural effusion. No focal airspace consolidation. No pneumothorax. Degenerative changes of the shoulders. IMPRESSION: Cardiomegaly with mild pulmonary vascular congestion and small right pleural effusion.  Electronically  Signed   By: Davina Poke D.O.   On: 12/19/2019 13:24    Impression: Anemia in the setting of GI blood loss. Patient is denying black stools, but loose, FOBT positive, black stool noted on rectal exam per ED staff. Patient does not appear to be having active bleeding, as he has not had any bowel movements today.  Decreased renal function.  BUN 83/creatinine 2.20/GFR 28. Hyponatremic (129).  Hyperkalemic (5.6).  Plan: Discussed EGD and colon with patient.  Patient amenable to EGD and colonoscopy with patient.  In the meantime, recommend vitamin K 10 mg x 1 dose.  Continue to monitor PT/INR.  We will not be able to proceed with EGD/colonoscopy until INR is below 2.0, preferably 1.5-1.7.  Continue to monitor H&H.  Transfuse as necessary to keep hemoglobin >7.  Meanwhile, keep patient on 40 mg Protonix twice daily.   LOS: 0 days   Ronnette Juniper, MD  12/19/2019, 3:01 PM

## 2019-12-19 NOTE — ED Notes (Signed)
Pt sleeping. 

## 2019-12-19 NOTE — ED Triage Notes (Signed)
Patient brought in to ED by nurse from [facility]. Person reported he was being seen at the clinic for weakness.

## 2019-12-19 NOTE — ED Notes (Signed)
Unsuccessful attempt to call report 

## 2019-12-19 NOTE — H&P (View-Only) (Signed)
[facility] Consult  Referring Provider: Dr. Malvin Johns Primary Care Physician:  Patient, No Pcp Per Primary Gastroenterologist: Althia Forts  Reason for Consultation: Anemia in the setting of rectal bleeding  HPI: Larry [name] is a 79 y.o. male with history of A. fib (on Xarelto), CAD, HTN, and diastolic CHF presenting with diarrhea and rectal bleeding.  Patient states he started having diarrhea on Tuesday 3/9.  He was having approximately 4 episodes of diarrhea per day accompanied by some bright red blood in the toilet bowl and around the stool.  However he does not believe the bleeding was mixed in with the stools, and he does not think the bleeding was a large amount.  He states that his bowel movements have decreased, and he has not had an episode of diarrhea since yesterday.  He has had no bowel movements today.  He denies black stools but does note that his stools have been dark due to the iron supplementation he has been on.  He states he has never had any rectal bleeding prior to this.  He denies abdominal pain, heartburn, dysphagia, nausea, vomiting.  He has noticed his appetite has decreased a little bit over the last few months.  He denies unexplained weight loss but does believe he lost about 3 pounds over the last few days because he has not been eating due to diarrhea.  He is on Xarelto for A. fib, last dose was yesterday 3/11.  He was previously on Tikosyn, but it was stopped approximately 2 weeks due to QT prolongation (550 ms).  He denies chest pain or shortness of breath.  Patient notes he has started feeling weak over the last few days but denies any episodes of presyncope or syncope.  He states he did not feel weak or tired prior to onset of diarrhea.  He notes his systolic blood pressure at home typically runs in the 70Y with diastolic of 63Z.  Today, hemglobin is 5.8, decreased from 10.12 weeks ago.  PT 39.4 with INR of 4.1.  He denies any family history of  gastrointestinal malignancies.  He has never had a colonoscopy or an EGD.   Past Medical History:  Diagnosis Date  . Coronary artery disease    status post remote PCI of the mid to distal RCA 11/1998 followed by acute ST elevation MI inferiorly 08/1999 with occlusion of the prior RCA stent as well as 70% diagonal branch.  He underwent PCI of the RCA  . Hypercholesterolemia   . Hypertension   . Myocardial infarct ([place])   . Olecranon bursitis     Past Surgical History:  Procedure Laterality Date  . CARDIAC CATHETERIZATION  12/15/1999   EF was 55%   . CORONARY ANGIOPLASTY WITH STENT PLACEMENT  08/15/1999   CAD, status post prior stenting of the mid to distal RCA in 11/1998, with an acute diaphragmatic wall infarction due to total occlusion at the stent site/There is also 70% narrowing in the diagonal branch of the LAD/The LV showed inferior wall hypo- akinesis.-- Successful reperfusion, percutaneous transluminal coronary percutaneous transluminal coronary & placement of a 2nd overlying stent in RCA    Prior to Admission medications   Medication Sig Start Date End Date Taking? Authorizing Provider  acetaminophen (TYLENOL) 500 MG tablet Take 500 mg by mouth every 6 (six) hours as needed.    [provider]  loratadine (CLARITIN) 10 MG tablet Take 10 mg by mouth daily.    [provider]  metoprolol succinate (TOPROL XL) 25  MG 24 hr tablet Take 1 tablet (25 mg total) by mouth daily. 06/25/19 06/24/20  Larey Dresser, MD  nitroGLYCERIN (NITROSTAT) 0.4 MG SL tablet Place 1 tablet (0.4 mg total) under the tongue every 5 (five) minutes as needed for chest pain (3 doses max). 02/19/18   Imogene Burn, PA-C  potassium chloride (K-DUR) 20 MEQ tablet Take 3 tablets (60 mEq total) by mouth every morning AND 2 tablets (40 mEq total) every evening. 06/25/19   Larey Dresser, MD  pravastatin (PRAVACHOL) 40 MG tablet Take 1 tablet (40 mg total) by mouth every evening. 12/04/19   Clegg,  Amy D, NP  spironolactone (ALDACTONE) 25 MG tablet Take 0.5 tablets (12.5 mg total) by mouth daily. 12/12/19   Larey Dresser, MD  torsemide (DEMADEX) 20 MG tablet Take 4 tablets (80 mg total) by mouth every morning AND 3 tablets (60 mg total) every evening. 08/08/19   Larey Dresser, MD  XARELTO 20 MG TABS tablet TAKE ONE TABLET (20 MG TOTAL) BY MOUTH DAILY WITH SUPPER 05/05/19   Bensimhon, Shaune Pascal, MD    Current Facility-Administered Medications  Medication Dose Route Frequency Provider Last Rate Last Admin  . 0.9 %  sodium chloride infusion (Manually program via Guardrails IV Fluids)   Intravenous Once Caccavale, Sophia, PA-C      . sodium chloride flush (NS) 0.9 % injection 3 mL  3 mL Intravenous Once Malvin Johns, MD       Current Outpatient Medications  Medication Sig Dispense Refill  . acetaminophen (TYLENOL) 500 MG tablet Take 500 mg by mouth every 6 (six) hours as needed.    . loratadine (CLARITIN) 10 MG tablet Take 10 mg by mouth daily.    . metoprolol succinate (TOPROL XL) 25 MG 24 hr tablet Take 1 tablet (25 mg total) by mouth daily. 30 tablet 11  . nitroGLYCERIN (NITROSTAT) 0.4 MG SL tablet Place 1 tablet (0.4 mg total) under the tongue every 5 (five) minutes as needed for chest pain (3 doses max). 25 tablet 3  . potassium chloride (K-DUR) 20 MEQ tablet Take 3 tablets (60 mEq total) by mouth every morning AND 2 tablets (40 mEq total) every evening. 150 tablet 3  . pravastatin (PRAVACHOL) 40 MG tablet Take 1 tablet (40 mg total) by mouth every evening. 90 tablet 0  . spironolactone (ALDACTONE) 25 MG tablet Take 0.5 tablets (12.5 mg total) by mouth daily. 45 tablet 3  . torsemide (DEMADEX) 20 MG tablet Take 4 tablets (80 mg total) by mouth every morning AND 3 tablets (60 mg total) every evening. 210 tablet 5  . XARELTO 20 MG TABS tablet TAKE ONE TABLET (20 MG TOTAL) BY MOUTH DAILY WITH SUPPER 30 tablet 5    Allergies as of 12/19/2019 - Review Complete 12/19/2019  Allergen  Reaction Noted  . Plavix [clopidogrel bisulfate] Anaphylaxis 11/02/2011    Family History  Problem Relation Age of Onset  . Heart Problems Mother   . Cancer Father     Social History   Socioeconomic History  . Marital status: Married    Spouse name: scarlett  . Number of children: 3  . Years of education: 42  . Highest education level: Not on file  Occupational History  . Occupation: retired  Tobacco Use  . Smoking status: Former Smoker    Types: Cigarettes    Quit date: 11/02/1999    Years since quitting: 20.1  . Smokeless tobacco: Never Used  Substance and Sexual Activity  .  Alcohol use: No  . Drug use: No  . Sexual activity: Not on file  Other Topics Concern  . Not on file  Social History Narrative  . Not on file   Social Determinants of Health   Financial Resource Strain:   . Difficulty of Paying Living Expenses:   Food Insecurity:   . Worried About Charity fundraiser in the Last Year:   . Arboriculturist in the Last Year:   Transportation Needs:   . Film/video editor (Medical):   Marland Kitchen Lack of Transportation (Non-Medical):   Physical Activity:   . Days of Exercise per Week:   . Minutes of Exercise per Session:   Stress:   . Feeling of Stress :   Social Connections:   . Frequency of Communication with Friends and Family:   . Frequency of Social Gatherings with Friends and Family:   . Attends Religious Services:   . Active Member of Clubs or Organizations:   . Attends Archivist Meetings:   Marland Kitchen Marital Status:   Intimate Partner Violence:   . Fear of Current or Ex-Partner:   . Emotionally Abused:   Marland Kitchen Physically Abused:   . Sexually Abused:     Review of Systems: As noted in HPI.   Physical Exam: Vital signs in last 24 hours: Temp:  [97.9 F (36.6 C)] 97.9 F (36.6 C) (03/12 1254) Pulse Rate:  [98-102] 98 (03/12 1254) Resp:  [18] 18 (03/12 1254) BP: (87-98)/(50-76) 98/76 (03/12 1312) SpO2:  [90 %] 90 % (03/12 1313) Weight:  [76.8  kg] 76.8 kg (03/12 1216)    General:   Alert,  pleasant and cooperative in NAD Head:  Normocephalic and atraumatic. Eyes:  Sclera clear, no icterus.   Conjunctival pallor. Ears:  Normal auditory acuity. Nose:  No deformity, discharge,  or lesions. Mouth:  No deformity or lesions.  Oropharynx pink & moist. Neck:  Supple; no masses or thyromegaly. Lungs:  Clear throughout to auscultation.   No wheezes, crackles, or rhonchi.  No acute distress but patient did have desaturations into the 80s when talking. Heart: Tachycardic (HR 110-115), irregularly irregular; no murmurs, clicks, rubs,  or gallops. Extremities:  Without clubbing or edema. Neurologic:  Alert and  oriented x4;  grossly normal neurologically. Skin:  Intact without significant lesions or rashes. Psych:  Alert and cooperative. Normal mood and affect. Abdomen:  Soft, nontender and nondistended. No masses, hepatosplenomegaly or hernias noted. Normal bowel sounds, without guarding, and without rebound.         Lab Results: Recent Labs    12/19/19 1300  WBC 14.5*  HGB 5.8*  HCT 20.1*  PLT 336   BMET Recent Labs    12/19/19 1300  NA 129*  K 5.6*  CL 94*  CO2 19*  GLUCOSE 133*  BUN 83*  CREATININE 2.20*  CALCIUM 9.1   LFT No results for input(s): PROT, ALBUMIN, AST, ALT, ALKPHOS, BILITOT, BILIDIR, IBILI in the last 72 hours. PT/INR Recent Labs    12/19/19 1300  LABPROT 39.4*  INR 4.1*    Studies/Results: DG Chest Portable 1 View  Result Date: 12/19/2019 CLINICAL DATA:  Shortness of breath, weakness EXAM: PORTABLE CHEST 1 VIEW COMPARISON:  03/22/2018 FINDINGS: Mild cardiomegaly, unchanged. Calcific aortic knob. Mild pulmonary vascular congestion. Small right pleural effusion. Probable trace left pleural effusion. No focal airspace consolidation. No pneumothorax. Degenerative changes of the shoulders. IMPRESSION: Cardiomegaly with mild pulmonary vascular congestion and small right pleural effusion.  Electronically  Signed   By: Davina Poke D.O.   On: 12/19/2019 13:24    Impression: Anemia in the setting of GI blood loss. Patient is denying black stools, but loose, FOBT positive, black stool noted on rectal exam per ED staff. Patient does not appear to be having active bleeding, as he has not had any bowel movements today.  Decreased renal function.  BUN 83/creatinine 2.20/GFR 28. Hyponatremic (129).  Hyperkalemic (5.6).  Plan: Discussed EGD and colon with patient.  Patient amenable to EGD and colonoscopy with patient.  In the meantime, recommend vitamin K 10 mg x 1 dose.  Continue to monitor PT/INR.  We will not be able to proceed with EGD/colonoscopy until INR is below 2.0, preferably 1.5-1.7.  Continue to monitor H&H.  Transfuse as necessary to keep hemoglobin >7.  Meanwhile, keep patient on 40 mg Protonix twice daily.   LOS: 0 days   Ronnette Juniper, MD  12/19/2019, 3:01 PM

## 2019-12-19 NOTE — H&P (Signed)
History and Physical    Larry [name] HCW:[id number] DOB: 1941/05/21 DOA: 12/19/2019  PCP: Patient, No Pcp Per Consultants:  [facility] - cardiology Patient coming from:  Home - lives with wife; NOK: Wife, Willard Farquharson, [phone]  Chief Complaint: Weakness  HPI: Larry [name] is a 79 y.o. male with medical history significant of CAD; HTN; chronic diastolic CHF; atrial flutter; and HLD presenting with weakness.  Patient reports that he saw cardiology recently (3/9) and had no difficulty walking through the parking lot and office.  Over the last couple of days, though, he is too weak to get out of bed.   He had diarrhea on 3/10 and 3/11, but none since yesterday afternoon.  He has been taking iron and so has dark stools, unsure if there was blood in it.  He also was taken off his Tikosyn for afib rate control due to prolonged QT and so cardiology has been working on rate control.   ED Course:  GI bleed, anemia - black stools, elevated BUN, Hgb 5.8.  On Xarelto, last dose yesterday.  Tachy and with afib, not on rate control currently.  Soft but stable BPs.  Dr. Therisa Doyne to see.  Has never seen GI, no h/o colonoscopy.   Review of Systems: As per HPI; otherwise review of systems reviewed and negative.   Ambulatory Status:  Ambulates without assistance  Past Medical History:  Diagnosis Date  . Coronary artery disease    status post remote PCI of the mid to distal RCA 11/1998 followed by acute ST elevation MI inferiorly 08/1999 with occlusion of the prior RCA stent as well as 70% diagonal branch.  He underwent PCI of the RCA  . Hypercholesterolemia   . Hypertension   . Myocardial infarct ([place])   . Olecranon bursitis     Past Surgical History:  Procedure Laterality Date  . CARDIAC CATHETERIZATION  12/15/1999   EF was 55%   . CORONARY ANGIOPLASTY WITH STENT PLACEMENT  08/15/1999   CAD, status post prior stenting of the mid to distal RCA in 11/1998, with an acute diaphragmatic wall  infarction due to total occlusion at the stent site/There is also 70% narrowing in the diagonal branch of the LAD/The LV showed inferior wall hypo- akinesis.-- Successful reperfusion, percutaneous transluminal coronary percutaneous transluminal coronary & placement of a 2nd overlying stent in RCA    Social History   Socioeconomic History  . Marital status: Married    Spouse name: scarlett  . Number of children: 3  . Years of education: 67  . Highest education level: Not on file  Occupational History  . Occupation: retired  Tobacco Use  . Smoking status: Former Smoker    Types: Cigarettes    Quit date: 11/02/1999    Years since quitting: 20.1  . Smokeless tobacco: Never Used  Substance and Sexual Activity  . Alcohol use: No  . Drug use: No  . Sexual activity: Not on file  Other Topics Concern  . Not on file  Social History Narrative  . Not on file   Social Determinants of Health   Financial Resource Strain:   . Difficulty of Paying Living Expenses:   Food Insecurity:   . Worried About Charity fundraiser in the Last Year:   . Arboriculturist in the Last Year:   Transportation Needs:   . Film/video editor (Medical):   Marland Kitchen Lack of Transportation (Non-Medical):   Physical Activity:   . Days of Exercise per Week:   .  Minutes of Exercise per Session:   Stress:   . Feeling of Stress :   Social Connections:   . Frequency of Communication with Friends and Family:   . Frequency of Social Gatherings with Friends and Family:   . Attends Religious Services:   . Active Member of Clubs or Organizations:   . Attends Archivist Meetings:   Marland Kitchen Marital Status:   Intimate Partner Violence:   . Fear of Current or Ex-Partner:   . Emotionally Abused:   Marland Kitchen Physically Abused:   . Sexually Abused:     Allergies  Allergen Reactions  . Plavix [Clopidogrel Bisulfate] Anaphylaxis         Family History  Problem Relation Age of Onset  . Heart Problems Mother   . Cancer  Father     Prior to Admission medications   Medication Sig Start Date End Date Taking? Authorizing Provider  acetaminophen (TYLENOL) 500 MG tablet Take 500 mg by mouth every 6 (six) hours as needed.    [provider]  loratadine (CLARITIN) 10 MG tablet Take 10 mg by mouth daily.    [provider]  metoprolol succinate (TOPROL XL) 25 MG 24 hr tablet Take 1 tablet (25 mg total) by mouth daily. 06/25/19 06/24/20  Larey Dresser, MD  nitroGLYCERIN (NITROSTAT) 0.4 MG SL tablet Place 1 tablet (0.4 mg total) under the tongue every 5 (five) minutes as needed for chest pain (3 doses max). 02/19/18   Imogene Burn, PA-C  potassium chloride (K-DUR) 20 MEQ tablet Take 3 tablets (60 mEq total) by mouth every morning AND 2 tablets (40 mEq total) every evening. 06/25/19   Larey Dresser, MD  pravastatin (PRAVACHOL) 40 MG tablet Take 1 tablet (40 mg total) by mouth every evening. 12/04/19   Clegg, Amy D, NP  spironolactone (ALDACTONE) 25 MG tablet Take 0.5 tablets (12.5 mg total) by mouth daily. 12/12/19   Larey Dresser, MD  torsemide (DEMADEX) 20 MG tablet Take 4 tablets (80 mg total) by mouth every morning AND 3 tablets (60 mg total) every evening. 08/08/19   Larey Dresser, MD  XARELTO 20 MG TABS tablet TAKE ONE TABLET (20 MG TOTAL) BY MOUTH DAILY WITH SUPPER 05/05/19   Bensimhon, Shaune Pascal, MD    Physical Exam: Vitals:   12/19/19 1718 12/19/19 1730 12/19/19 1747 12/19/19 1821  BP:  95/67 95/67 105/72  Pulse: 100 93 (!) 112 94  Resp: 18 (!) 24 20 20   Temp:   98.2 F (36.8 C) 97.8 F (36.6 C)  TempSrc:   Oral Oral  SpO2: 100% 100%  100%     . General:  Appears calm and comfortable and is NAD, mildly fatigued and pale . Eyes:  EOMI, normal lids, iris . ENT:  grossly normal hearing, lips & tongue, mmm . Neck:  no LAD, masses or thyromegaly . Cardiovascular:  RRR, no m/r/g. No LE edema.  Marland Kitchen Respiratory:   CTA bilaterally with no wheezes/rales/rhonchi.  Normal respiratory  effort. . Abdomen:  soft, NT, ND, NABS . Skin:  no rash or induration seen on limited exam . Musculoskeletal:  grossly normal tone BUE/BLE, good ROM, no bony abnormality . Psychiatric:  grossly normal mood and affect, speech fluent and appropriate, AOx3 . Neurologic:  CN 2-12 grossly intact, moves all extremities in coordinated fashion    Radiological Exams on Admission: DG Chest Portable 1 View  Result Date: 12/19/2019 CLINICAL DATA:  Shortness of breath, weakness EXAM: PORTABLE CHEST 1  VIEW COMPARISON:  03/22/2018 FINDINGS: Mild cardiomegaly, unchanged. Calcific aortic knob. Mild pulmonary vascular congestion. Small right pleural effusion. Probable trace left pleural effusion. No focal airspace consolidation. No pneumothorax. Degenerative changes of the shoulders. IMPRESSION: Cardiomegaly with mild pulmonary vascular congestion and small right pleural effusion. Electronically Signed   By: Davina Poke D.O.   On: 12/19/2019 13:24    EKG: unable to independently review at this time    Labs on Admission: I have personally reviewed the available labs and imaging studies at the time of the admission.  Pertinent labs:   Na++ 129 K+ 5.6 Glucose 133 BUN 83/Creatinine 2.20/GFR 28; 27/1.84/GFR 35 on 2/25 WBC 14.5 Hgb 5.8; 10.1 on 2/25 INR 4.1 Heme positive   Assessment/Plan Principal Problem:   Acute upper GI bleeding Active Problems:   HYPERCHOLESTEROLEMIA   Hypertension   Acute diastolic CHF (congestive heart failure) (HCC)   Atrial fibrillation with RVR (HCC)   Stage 3b chronic kidney disease    Acute upper GI bleeding -Patient's fatigue is most likely caused by anemia secondary to upper GI bleeding.  -Patient has no history of prior but has never had colonoscopy -Likely UGI or SB source although high colon lesion is also a consideration; patient would likely benefit from EGD and colonoscopy but is currently agreeing only to EGD by GI  -His Hgb decreased from 10.1 on  2/25 to 5.8 today.  -The patient is currently mildly tachycardic with soft blood pressure, suggesting acute volume loss.  -Type and screen were done in ED.  -Two units of blood were ordered by ED.  - will admit to tele bed - GI consulted by ED, will follow up recommendations - NPO for possible EGD/colonoscopy tomorrow - Hold IVF for now given volume overload - Start IV pantoprazole 40 mg bid - Zofran IV for nausea - Avoid NSAIDs and SQ heparin - Maintain IV access (2 large bore IVs if possible). - Monitor closely and follow cbc, transfuse as necessary for Hgb <7. -Hold Xarelto  Afib with RVR -He has been followed by cardiology for this issue -He was previously on Tikosyn but has prolonged QTc and this was stopped -Will request cardiology assistance for management of this issue -No AC given GI bleeding -His INR was likely falsely elevated due to DOAC but there is likely little harm (or benefit) in giving vitamin K so will give empirically at this time  Stage 3b CKD -Slightly worse than baseline, likely associated with decreased renal perfusion in the setting of GI bleeding -Will follow  Acute on chronic combined CHF -Echo in 02/2018 with EF 50-55% -Mild volume overload seen on CXR but this may be related to severe anemia rather than true volume overload since he is likely mildly volume deficient -For now, hold diuretics and also IVF and continue to monitor -Cardiology consult, as above  HTN -Continue Toprol XL  HLD -Continue Pravachol    Note: This patient has been tested and is negative for the novel coronavirus COVID-19.  DVT prophylaxis:  SCDs Code Status:  Full - confirmed with patient Family Communication: None present; I spoke with the patient's husband by telephone at the time of admission. Disposition Plan:  He is anticipated to d/c to home without [facility] services once his GI issues have been resolved. Consults called: GI; cardiology  Admission status: Admit - It is  my clinical opinion that admission to INPATIENT is reasonable and necessary because of the expectation that this patient will require hospital care that crosses at  least 2 midnights to treat this condition based on the medical complexity of the problems presented.  Given the aforementioned information, the predictability of an adverse outcome is felt to be significant.    Karmen Bongo MD [facility]   How to contact the [facility] Attending or Consulting provider [place] or covering provider during after hours [place], for this patient?  1. Check the care team in [facility] and look for a) attending/consulting [facility] provider listed and b) the [facility] team listed 2. Log into [email or web address] and use [place] universal password to access. If you do not have the password, please contact the hospital operator. 3. Locate the [facility] provider you are looking for under [facility] and page to a number that you can be directly reached. 4. If you still have difficulty reaching the provider, please page the [facility] (Director on Call) for the Hospitalists listed on amion for assistance.   12/19/2019, 6:57 PM

## 2019-12-20 LAB — TYPE AND SCREEN
ABO/RH(D): A POS
Antibody Screen: NEGATIVE
Unit division: 0
Unit division: 0

## 2019-12-20 LAB — BPAM RBC
Blood Product Expiration Date: 202103292359
Blood Product Expiration Date: 202103292359
ISSUE DATE / TIME: 202103121506
ISSUE DATE / TIME: 202103122038
Unit Type and Rh: 6200
Unit Type and Rh: 6200

## 2019-12-20 LAB — BASIC METABOLIC PANEL
Anion gap: 13 (ref 5–15)
BUN: 80 mg/dL — ABNORMAL HIGH (ref 8–23)
CO2: 21 mmol/L — ABNORMAL LOW (ref 22–32)
Calcium: 8.5 mg/dL — ABNORMAL LOW (ref 8.9–10.3)
Chloride: 94 mmol/L — ABNORMAL LOW (ref 98–111)
Creatinine, Ser: 2.31 mg/dL — ABNORMAL HIGH (ref 0.61–1.24)
GFR calc Af Amer: 31 mL/min — ABNORMAL LOW (ref >60)
GFR calc non Af Amer: 26 mL/min — ABNORMAL LOW (ref >60)
Glucose, Bld: 142 mg/dL — ABNORMAL HIGH (ref 70–99)
Potassium: 4.9 mmol/L (ref 3.5–5.1)
Sodium: 128 mmol/L — ABNORMAL LOW (ref 135–145)

## 2019-12-20 LAB — CBC
HCT: 24.6 % — ABNORMAL LOW (ref 39.0–52.0)
Hemoglobin: 7.8 g/dL — ABNORMAL LOW (ref 13.0–17.0)
MCH: 26.5 pg (ref 26.0–34.0)
MCHC: 31.7 g/dL (ref 30.0–36.0)
MCV: 83.7 fL (ref 80.0–100.0)
Platelets: 290 10*3/uL (ref 150–400)
RBC: 2.94 MIL/uL — ABNORMAL LOW (ref 4.22–5.81)
RDW: 18.2 % — ABNORMAL HIGH (ref 11.5–15.5)
WBC: 15.7 10*3/uL — ABNORMAL HIGH (ref 4.0–10.5)
nRBC: 1.9 % — ABNORMAL HIGH (ref 0.0–0.2)

## 2019-12-20 LAB — PROTIME-INR
INR: 2.4 — ABNORMAL HIGH (ref 0.8–1.2)
Prothrombin Time: 25.8 seconds — ABNORMAL HIGH (ref 11.4–15.2)

## 2019-12-20 MED ORDER — SODIUM CHLORIDE 0.9 % IV SOLN: INTRAVENOUS | Status: DC

## 2019-12-20 MED ORDER — PEG 3350-KCL-NA BICARB-NACL 420 G PO SOLR
4000.0000 mL | Freq: Once | ORAL | Status: DC
  Filled 2019-12-20: qty 4000

## 2019-12-20 NOTE — Progress Notes (Signed)
Patient and his family (wife and daughter) want to hold off renal ultrasound. Notified DR Tawanna Solo.

## 2019-12-20 NOTE — Progress Notes (Signed)
Progress Note  Patient Name: Larry [name] Date of Encounter: 12/20/2019  Primary Cardiologist: No primary care provider on file.   Subjective   Patient seen with his wife present. Thorough discussion had about cardiovascular care and blood loss anemia.   Inpatient Medications    Scheduled Meds: . sodium chloride   Intravenous Once  . amiodarone  150 mg Intravenous Once  . metoprolol succinate  25 mg Oral Daily  . pantoprazole (PROTONIX) IV  40 mg Intravenous Q12H  . polyethylene glycol-electrolytes  4,000 mL Oral Once  . pravastatin  40 mg Oral QPM   Continuous Infusions: . sodium chloride 100 mL/hr at 12/20/19 1016  . amiodarone 30 mg/hr (12/20/19 0507)   PRN Meds: acetaminophen **OR** acetaminophen, ondansetron **OR** ondansetron (ZOFRAN) IV   Vital Signs    Vitals:   12/20/19 0054 12/20/19 0513 12/20/19 0825 12/20/19 1020  BP: (!) 88/66 98/70 95/64  95/74  Pulse: 98 87 (!) 101 85  Resp: 20 16    Temp: 97.7 F (36.5 C) (!) 97.2 F (36.2 C)    TempSrc: Oral Oral    SpO2: 100% 100% 100% 98%  Weight:  78.7 kg    Height:        Intake/Output Summary (Last 24 hours) at 12/20/2019 1023 Last data filed at 12/20/2019 0830 Gross per 24 hour  Intake 691.44 ml  Output 800 ml  Net -108.56 ml   Last 3 Weights 12/20/2019 12/19/2019 12/19/2019  Weight (lbs) 173 lb 8 oz 169 lb 6.4 oz 169 lb 6.4 oz  Weight (kg) 78.7 kg 76.839 kg 76.839 kg      Telemetry    Afib, rates 100-110 - Personally Reviewed  ECG    Afib, rate 116 - Personally Reviewed  Physical Exam   GEN: No acute distress.   Neck: No JVD Cardiac: irregular rhythm, tachycardic, 1/6 systolic murmur, rubs, or gallops.  Respiratory: Clear to auscultation bilaterally. GI: Soft, nontender, non-distended  MS: Trace pretibial edema; No deformity. R shin dressed. Neuro:  Nonfocal  Psych: Normal affect   Labs    High Sensitivity Troponin:  No results for input(s): TROPONINIHS in the last 720 hours.     Chemistry Recent Labs  Lab 12/19/19 1300 12/19/19 1500 12/20/19 0747  NA 129*  --  128*  K 5.6*  --  4.9  CL 94*  --  94*  CO2 19*  --  21*  GLUCOSE 133*  --  142*  BUN 83*  --  80*  CREATININE 2.20*  --  2.31*  CALCIUM 9.1  --  8.5*  PROT  --  6.1*  --   ALBUMIN  --  3.0*  --   AST  --  19  --   ALT  --  13  --   ALKPHOS  --  101  --   BILITOT  --  2.1*  --   GFRNONAA 28*  --  26*  GFRAA 33*  --  31*  ANIONGAP 16*  --  13     Hematology Recent Labs  Lab 12/19/19 1300 12/19/19 2031 12/20/19 0747  WBC 14.5* 10.5 15.7*  RBC 2.37* 2.52* 2.94*  HGB 5.8* 6.5* 7.8*  HCT 20.1* 21.1* 24.6*  MCV 84.8 83.7 83.7  MCH 24.5* 25.8* 26.5  MCHC 28.9* 30.8 31.7  RDW 18.5* 18.0* 18.2*  PLT 336 279 290    BNP Recent Labs  Lab 12/19/19 1404  BNP 192.2*     DDimer No results for input(s): DDIMER  in the last 168 hours.   Radiology    DG Chest Portable 1 View  Result Date: 12/19/2019 CLINICAL DATA:  Shortness of breath, weakness EXAM: PORTABLE CHEST 1 VIEW COMPARISON:  03/22/2018 FINDINGS: Mild cardiomegaly, unchanged. Calcific aortic knob. Mild pulmonary vascular congestion. Small right pleural effusion. Probable trace left pleural effusion. No focal airspace consolidation. No pneumothorax. Degenerative changes of the shoulders. IMPRESSION: Cardiomegaly with mild pulmonary vascular congestion and small right pleural effusion. Electronically Signed   By: Davina Poke D.O.   On: 12/19/2019 13:24    Cardiac Studies   Echo pending  Patient Profile     Larry [name] is a 79 y.o. male with a PMH of CAD s/p PCI to RCA in 2000 with subsequent STEMI later that year for ISR managed with repeat PCI to RCA, chronic diastolic CHF, paroxysmal atrial fibrillation/flutter, HTN, HLD, OSA on CPAP, and CKD stage 3 who is being seen today for the evaluation of atrial fibrillation/flutter at the request of Dr. Lorin Mercy.  Assessment & Plan       1. Atrial flutter with RVR and variable  block: known history. Recently stopped Tikosyn due to QT prolongation. Has been back in atrial flutter since at least 12/16/19. He is historically unaware of his atrial fib/flutter. BP has been soft limiting rate control over the past several days due to underlying diarrhea related to acute GI bleed.  - Can continue metoprolol as BP will allow, held this am. - continue amiodarone - hold xarelto until cleared by GI to resume. I discussed alternate DOAC vs warfarin vs no AC in detail with family and discussed that this is a complex decision and will require further information about his GI bleeding to make a final choice.  2. Acute GI bleed: he notes frank blood in stool Tuesday/Wednesday this week. No recurrent diarrhea yesterday or today. Hbg 5.8 on admission. Stool guaiac positive. Received PRBC. - Continue to monitor Hgb closely and replete to maintain >8 given CAD history - hold xarelto - Continue to monitor volume status closely with transfusions, appears more euvolemic today.  3. Chronic diastolic CHF: volume status appears stable. Likely dry on presentation due to recent diarrhea. Cr elevated from baseline, now 2.31. BNP in the 100s. - Favor holding torsemide again today. - Monitor volume status closely with transfusions and volume repletion - Home spironolactone on hold given hypotension  4. CAD s/p PCI to RCA in 2000: no complaints of chest pain/SOB. Not on aspirin given need for anticoagulation - Continue statin  5. HLD: - Continue statin.  6. CKD stage 3: Cr up to 2.31 from baseline 1.5. Likely 2/2 dehydration with recent diarrhea/GI bleed, may represent ATN, will need to observe. - Continue to monitor closely   Thorough discussion had with patient and his wife regarding cardiovascular issues and extracardiac factors at play.   For questions or updates, please contact [place] Please consult [email or web address] for contact info under        Signed, Elouise Munroe, MD    12/20/2019, 10:23 AM

## 2019-12-20 NOTE — Progress Notes (Signed)
Subjective: The patient was seen and examined at bedside. He is denying to have an endoscopy or colonoscopy for further evaluation of anemia.  Objective: Vital signs in last 24 hours: Temp:  [97.2 F (36.2 C)-98.3 F (36.8 C)] 97.8 F (36.6 C) (03/13 1135) Pulse Rate:  [81-112] 81 (03/13 1135) Resp:  [15-26] 20 (03/13 1135) BP: (82-105)/(58-74) 90/66 (03/13 1135) SpO2:  [91 %-100 %] 100 % (03/13 1135) Weight:  [76.8 kg-78.7 kg] 78.7 kg (03/13 0513) Weight change:  Last BM Date: 12/19/19  PE: Elderly, mild pallor GENERAL: Not in distress, alert, oriented x3 ABDOMEN: Soft, nondistended, nontender EXTREMITIES: No deformity  Lab Results: Results for orders placed or performed during the hospital encounter of 12/19/19 (from the past 48 hour(s))  Basic metabolic panel     Status: Abnormal   Collection Time: 12/19/19  1:00 PM  Result Value Ref Range   Sodium 129 (L) 135 - 145 mmol/L   Potassium 5.6 (H) 3.5 - 5.1 mmol/L   Chloride 94 (L) 98 - 111 mmol/L   CO2 19 (L) 22 - 32 mmol/L   Glucose, Bld 133 (H) 70 - 99 mg/dL    Comment: Glucose reference range applies only to samples taken after fasting for at least 8 hours.   BUN 83 (H) 8 - 23 mg/dL   Creatinine, Ser 2.20 (H) 0.61 - 1.24 mg/dL   Calcium 9.1 8.9 - 10.3 mg/dL   GFR calc non Af Amer 28 (L) >60 mL/min   GFR calc Af Amer 33 (L) >60 mL/min   Anion gap 16 (H) 5 - 15    Comment: Performed at [facility] [address] [place], [place] [address]  CBC     Status: Abnormal   Collection Time: 12/19/19  1:00 PM  Result Value Ref Range   WBC 14.5 (H) 4.0 - 10.5 K/uL   RBC 2.37 (L) 4.22 - 5.81 MIL/uL   Hemoglobin 5.8 (LL) 13.0 - 17.0 g/dL    Comment: REPEATED TO VERIFY THIS CRITICAL RESULT HAS VERIFIED AND BEEN CALLED TO MOON,K RN BY AMANDA LEONARD ON 03 12 2021 AT 35, AND HAS BEEN READ BACK.     HCT 20.1 (L) 39.0 - 52.0 %   MCV 84.8 80.0 - 100.0 fL   MCH 24.5 (L) 26.0 - 34.0 pg   MCHC 28.9 (L) 30.0 - 36.0 g/dL    RDW 18.5 (H) 11.5 - 15.5 %   Platelets 336 150 - 400 K/uL   nRBC 1.3 (H) 0.0 - 0.2 %    Comment: Performed at [facility] [address], [place], [place] [address]  Protime-INR- (order if Patient is taking Coumadin / Warfarin)     Status: Abnormal   Collection Time: 12/19/19  1:00 PM  Result Value Ref Range   Prothrombin Time 39.4 (H) 11.4 - 15.2 seconds   INR 4.1 (HH) 0.8 - 1.2    Comment: REPEATED TO VERIFY CRITICAL RESULT CALLED TO, READ BACK BY AND VERIFIED WITH: K MOON RN [phone] [id number] BY A BENNETT (NOTE) INR goal varies based on device and disease states. Performed at [facility], [place] [address] [place], [place] [address]   Differential     Status: Abnormal   Collection Time: 12/19/19  1:00 PM  Result Value Ref Range   Neutrophils Relative % 79 %   Neutro Abs 11.4 (H) 1.7 - 7.7 K/uL   Lymphocytes Relative 6 %   Lymphs Abs 0.9 0.7 - 4.0 K/uL  Monocytes Relative 14 %   Monocytes Absolute 2.0 (H) 0.1 - 1.0 K/uL   Eosinophils Relative 0 %   Eosinophils Absolute 0.0 0.0 - 0.5 K/uL   Basophils Relative 0 %   Basophils Absolute 0.0 0.0 - 0.1 K/uL   Immature Granulocytes 1 %   Abs Immature Granulocytes 0.12 (H) 0.00 - 0.07 K/uL    Comment: Performed at [facility] [address] [place], [place] [address]  POC occult blood, ED Provider will collect     Status: Abnormal   Collection Time: 12/19/19  1:36 PM  Result Value Ref Range   Fecal Occult Bld POSITIVE (A) NEGATIVE  SARS CORONAVIRUS 2 (TAT 6-24 HRS) Nasopharyngeal Nasopharyngeal Swab     Status: None   Collection Time: 12/19/19  1:53 PM   Specimen: Nasopharyngeal Swab  Result Value Ref Range   SARS Coronavirus 2 NEGATIVE NEGATIVE    Comment: (NOTE) SARS-CoV-2 target nucleic acids are NOT DETECTED. The SARS-CoV-2 RNA is generally detectable in upper and lower respiratory specimens during the acute phase of infection. Negative results do not preclude SARS-CoV-2 infection, do not rule  out co-infections with other pathogens, and should not be used as the sole basis for treatment or other patient management decisions. Negative results must be combined with clinical observations, patient history, and epidemiological information. The expected result is Negative. Fact Sheet for Patients: SugarRoll.be Fact Sheet for Healthcare Providers: https://www.woods-mathews.com/ This test is not yet approved or cleared by the [place] FDA and  has been authorized for detection and/or diagnosis of SARS-CoV-2 by FDA under an Emergency Use Authorization (EUA). This EUA will remain  in effect (meaning this test can be used) for the duration of the COVID-19 declaration under Section 56 4(b)(1) of the Act, 21 U.S.C. section 360bbb-3(b)(1), unless the authorization is terminated or revoked sooner. Performed at [facility], [place] [address], [place], [place] [address]   Type and screen [place]     Status: None   Collection Time: 12/19/19  2:04 PM  Result Value Ref Range   ABO/RH(D) A POS    Antibody Screen NEG    Sample Expiration 12/22/2019,2359    Unit Number [id number]    Blood Component Type RED CELLS,LR    Unit division 00    Status of Unit ISSUED,FINAL    Transfusion Status OK TO TRANSFUSE    Crossmatch Result Compatible    Unit Number [id number]    Blood Component Type RED CELLS,LR    Unit division 00    Status of Unit ISSUED,FINAL    Transfusion Status OK TO TRANSFUSE    Crossmatch Result      Compatible Performed at [facility], [place] [address] [place], [place] [address]   Brain natriuretic peptide     Status: Abnormal   Collection Time: 12/19/19  2:04 PM  Result Value Ref Range   B Natriuretic Peptide 192.2 (H) 0.0 - 100.0 pg/mL    Comment: Performed at [facility] [address], [place], [place] [address]  Prepare RBC     Status: None   Collection Time: 12/19/19  2:04 PM   Result Value Ref Range   Order Confirmation      ORDER PROCESSED BY BLOOD BANK Performed at [facility], [place] [address] [place], [place] [address]   ABO/Rh     Status: None   Collection Time: 12/19/19  2:04 PM  Result Value Ref Range  ABO/RH(D)      A POS Performed at [facility], [place] [address], [place], [place] [address]   Hepatic function panel     Status: Abnormal   Collection Time: 12/19/19  3:00 PM  Result Value Ref Range   Total Protein 6.1 (L) 6.5 - 8.1 g/dL   Albumin 3.0 (L) 3.5 - 5.0 g/dL   AST 19 15 - 41 U/L   ALT 13 0 - 44 U/L   Alkaline Phosphatase 101 38 - 126 U/L   Total Bilirubin 2.1 (H) 0.3 - 1.2 mg/dL   Bilirubin, Direct 0.9 (H) 0.0 - 0.2 mg/dL   Indirect Bilirubin 1.2 (H) 0.3 - 0.9 mg/dL    Comment: Performed at [facility] [address] [place], [place] [address]  CBC     Status: Abnormal   Collection Time: 12/19/19  8:31 PM  Result Value Ref Range   WBC 10.5 4.0 - 10.5 K/uL   RBC 2.52 (L) 4.22 - 5.81 MIL/uL   Hemoglobin 6.5 (LL) 13.0 - 17.0 g/dL    Comment: REPEATED TO VERIFY CRITICAL VALUE NOTED.  VALUE IS CONSISTENT WITH PREVIOUSLY REPORTED AND CALLED VALUE.    HCT 21.1 (L) 39.0 - 52.0 %   MCV 83.7 80.0 - 100.0 fL   MCH 25.8 (L) 26.0 - 34.0 pg   MCHC 30.8 30.0 - 36.0 g/dL   RDW 18.0 (H) 11.5 - 15.5 %   Platelets 279 150 - 400 K/uL   nRBC 1.8 (H) 0.0 - 0.2 %    Comment: Performed at [facility] [address], [place], [place] [address]  Basic metabolic panel     Status: Abnormal   Collection Time: 12/20/19  7:47 AM  Result Value Ref Range   Sodium 128 (L) 135 - 145 mmol/L   Potassium 4.9 3.5 - 5.1 mmol/L   Chloride 94 (L) 98 - 111 mmol/L   CO2 21 (L) 22 - 32 mmol/L   Glucose, Bld 142 (H) 70 - 99 mg/dL    Comment: Glucose reference range applies only to samples taken after fasting for at least 8 hours.   BUN 80 (H) 8 - 23 mg/dL   Creatinine, Ser 2.31 (H) 0.61 - 1.24 mg/dL   Calcium 8.5 (L) 8.9 - 10.3 mg/dL    GFR calc non Af Amer 26 (L) >60 mL/min   GFR calc Af Amer 31 (L) >60 mL/min   Anion gap 13 5 - 15    Comment: Performed at [facility] [address] [place], [place] [address]  CBC     Status: Abnormal   Collection Time: 12/20/19  7:47 AM  Result Value Ref Range   WBC 15.7 (H) 4.0 - 10.5 K/uL   RBC 2.94 (L) 4.22 - 5.81 MIL/uL   Hemoglobin 7.8 (L) 13.0 - 17.0 g/dL   HCT 24.6 (L) 39.0 - 52.0 %   MCV 83.7 80.0 - 100.0 fL   MCH 26.5 26.0 - 34.0 pg   MCHC 31.7 30.0 - 36.0 g/dL   RDW 18.2 (H) 11.5 - 15.5 %   Platelets 290 150 - 400 K/uL   nRBC 1.9 (H) 0.0 - 0.2 %    Comment: Performed at [facility] [address] [place], [place] [address]  Protime-INR     Status: Abnormal   Collection Time: 12/20/19  7:47 AM  Result Value Ref Range   Prothrombin Time 25.8 (H) 11.4 - 15.2 seconds  INR 2.4 (H) 0.8 - 1.2    Comment: (NOTE) INR goal varies based on device and disease states. Performed at [facility], [place] [address] [place], [place] [address]     Studies/Results: DG Chest Portable 1 View  Result Date: 12/19/2019 CLINICAL DATA:  Shortness of breath, weakness EXAM: PORTABLE CHEST 1 VIEW COMPARISON:  03/22/2018 FINDINGS: Mild cardiomegaly, unchanged. Calcific aortic knob. Mild pulmonary vascular congestion. Small right pleural effusion. Probable trace left pleural effusion. No focal airspace consolidation. No pneumothorax. Degenerative changes of the shoulders. IMPRESSION: Cardiomegaly with mild pulmonary vascular congestion and small right pleural effusion. Electronically Signed   By: Davina Poke D.O.   On: 12/19/2019 13:24    Medications: I have reviewed the patient's current medications.  Assessment: Anemia, history of diarrhea with bright red blood, was on Xarelto, no prior EGD or colonoscopy Hb 5.8 on presentation with PT/INR of 39.4/4.1  Status post 2 units PRBC transfusion, on IV proton, Xarelto on hold Hemoglobin today 7.8 PT/INR  25.8/2.4  A. fib with RVR Acute on chronic kidney disease Acute on chronic combined CHF  Plan: Patient does not want to proceed with endoscopy or colonoscopy. I had a detailed discussion about the risks and the benefits of the procedure. He does not want the procedures to be performed as inpatient, is requesting for the discharge and for the procedures to be done as an outpatient.  At this point, unfortunately without further GI investigation, I cannot give clearance for restarting Xarelto, as I do not know the source of blood loss.  Since patient does not want any procedures, I will restart him on a diet.  I will sign off, please recall GI if situation changes.  Ronnette Juniper, MD 12/20/2019, 2:58 PM

## 2019-12-20 NOTE — Progress Notes (Signed)
PROGRESS NOTE    Larry [name]  ALP:[id number] DOB: [birth year] DOA: 12/19/2019 PCP: Patient, No Pcp Per   Brief Narrative:  Patient is a 89-year male with history of coronary artery disease, CKD stage IIIb, Hypertension, chronic diastolic congestive heart failure, atrial flutter, hyperlipidemia who presents with weakness.  Or the last couple of days, he was too weak to get out of the bed.  He also had diarrhea with dark stools.  He follows with cardiology for his atrial flutter.  On presentation, his stool was found to be Hemoccult positive, had AKI, hemoglobin of 5.8, A. fib with RVR.  Cardiology and GI consulted.  Assessment & Plan:   Principal Problem:   Acute upper GI bleeding Active Problems:   HYPERCHOLESTEROLEMIA   Hypertension   Acute diastolic CHF (congestive heart failure) (HCC)   Atrial fibrillation with RVR (HCC)   Stage 3b chronic kidney disease    Upper GI bleed: FOBT positive.  Hemoglobin 5.8 on presentation.  GI consulted.  He was transfused with 2 units of PRBCs.  Started on Protonix IV.  Xarelto on hold.  INR was supratherapeutic so he was given vitamin K. Hemoglobin this morning is in the range of 7.  He firmly denies undergoing endoscopy and colonoscopy during this hospitalization.  His wife also does not want to proceed with this.  They want to follow as an outpatient for these procedures after he becomes more 'strong'.  A. fib with RVR: Follows with cardiology as an outpatient.  He was previously on Tikosyn but was stopped due to prolonged QTC.  Cardiology following.  Started on amiodarone drip.  Also on metoprolol.  Xarelto held  due to GI bleed.  AKI on CKD stage IIIb: Creatinine up from baseline.  His baseline creatinine is 1.5.  Clinically he is dehydrated.  Initiated IV fluids till tomorrow.  Continue to monitor. Check renal ultrasound, urine sodium, urine protein  Acute on chronic combined CHF: Echo on 5/12 minutes ejection fraction 55%.  Appears volume  depleted.  Diuretics on hold.  Hypertension: Currently blood pressure soft.  Toprol on hold.  Hyperlipidemia: On Pravachol  Generalized weakness: Requested for physical therapy assessment      DVT prophylaxis:SCD Code Status: Full Family Communication: Wife at the bdside Disposition Plan: Patient is from home.  PT/OT evaluation pending.  Discharge planning to home after full work-up for GI bleed, A. fib with RVR   Consultants: GI, cardiology  Procedures: None  Antimicrobials:  Anti-infectives (From admission, onward)   None      Subjective: Patient seen and examined the bedside this morning.  Hemodynamically stable.  Complains of generalized weakness.  Denies any abdomen pain, nausea or vomiting.  No bloody bowel movement since admission.  Denies going for EGD/colonoscopy  Objective: Vitals:   12/20/19 0000 12/20/19 0028 12/20/19 0054 12/20/19 0513  BP: 96/68 90/62 (!) 88/66 98/70  Pulse:  95 98 87  Resp:  16 20 16   Temp:  98.1 F (36.7 C) 97.7 F (36.5 C) (!) 97.2 F (36.2 C)  TempSrc:  Oral Oral Oral  SpO2:  100% 100% 100%  Weight:    78.7 kg  Height:        Intake/Output Summary (Last 24 hours) at 12/20/2019 0821 Last data filed at 12/20/2019 0200 Gross per 24 hour  Intake 691.44 ml  Output 600 ml  Net 91.44 ml   Filed Weights   12/19/19 1845 12/20/19 0513  Weight: 76.8 kg 78.7 kg    Examination:  General  exam: Generalized weakness Respiratory system: Bilateral equal air entry, normal vesicular breath sounds, no wheezes or crackles  Cardiovascular system: A. fib. No JVD, murmurs, rubs, gallops or clicks. No pedal edema. Gastrointestinal system: Abdomen is nondistended, soft and nontender. No organomegaly or masses felt. Normal bowel sounds heard. Central nervous system: Alert and oriented. No focal neurological deficits. Extremities: No edema, no clubbing ,no cyanosis Skin: No rashes, lesions or ulcers,no icterus ,no pallor   Data Reviewed: I have  personally reviewed following labs and imaging studies  CBC: Recent Labs  Lab 12/19/19 1300 12/19/19 2031  WBC 14.5* 10.5  NEUTROABS 11.4*  --   HGB 5.8* 6.5*  HCT 20.1* 21.1*  MCV 84.8 83.7  PLT 336 010   Basic Metabolic Panel: Recent Labs  Lab 12/19/19 1300  NA 129*  K 5.6*  CL 94*  CO2 19*  GLUCOSE 133*  BUN 83*  CREATININE 2.20*  CALCIUM 9.1   GFR: Estimated Creatinine Clearance: 31.4 mL/min (A) (by C-G formula based on SCr of 2.2 mg/dL (H)). Liver Function Tests: Recent Labs  Lab 12/19/19 1500  AST 19  ALT 13  ALKPHOS 101  BILITOT 2.1*  PROT 6.1*  ALBUMIN 3.0*   No results for input(s): LIPASE, AMYLASE in the last 168 hours. No results for input(s): AMMONIA in the last 168 hours. Coagulation Profile: Recent Labs  Lab 12/19/19 1300  INR 4.1*   Cardiac Enzymes: No results for input(s): CKTOTAL, CKMB, CKMBINDEX, TROPONINI in the last 168 hours. BNP (last 3 results) No results for input(s): PROBNP in the last 8760 hours. HbA1C: No results for input(s): HGBA1C in the last 72 hours. CBG: No results for input(s): GLUCAP in the last 168 hours. Lipid Profile: No results for input(s): CHOL, HDL, LDLCALC, TRIG, CHOLHDL, LDLDIRECT in the last 72 hours. Thyroid Function Tests: No results for input(s): TSH, T4TOTAL, FREET4, T3FREE, THYROIDAB in the last 72 hours. Anemia Panel: No results for input(s): VITAMINB12, FOLATE, FERRITIN, TIBC, IRON, RETICCTPCT in the last 72 hours. Sepsis Labs: No results for input(s): PROCALCITON, LATICACIDVEN in the last 168 hours.  Recent Results (from the past 240 hour(s))  SARS CORONAVIRUS 2 (TAT 6-24 HRS) Nasopharyngeal Nasopharyngeal Swab     Status: None   Collection Time: 12/19/19  1:53 PM   Specimen: Nasopharyngeal Swab  Result Value Ref Range Status   SARS Coronavirus 2 NEGATIVE NEGATIVE Final    Comment: (NOTE) SARS-CoV-2 target nucleic acids are NOT DETECTED. The SARS-CoV-2 RNA is generally detectable in upper and  lower respiratory specimens during the acute phase of infection. Negative results do not preclude SARS-CoV-2 infection, do not rule out co-infections with other pathogens, and should not be used as the sole basis for treatment or other patient management decisions. Negative results must be combined with clinical observations, patient history, and epidemiological information. The expected result is Negative. Fact Sheet for Patients: SugarRoll.be Fact Sheet for Healthcare Providers: https://www.woods-mathews.com/ This test is not yet approved or cleared by the [place] FDA and  has been authorized for detection and/or diagnosis of SARS-CoV-2 by FDA under an Emergency Use Authorization (EUA). This EUA will remain  in effect (meaning this test can be used) for the duration of the COVID-19 declaration under Section 56 4(b)(1) of the Act, 21 U.S.C. section 360bbb-3(b)(1), unless the authorization is terminated or revoked sooner. Performed at [facility], [place] [address], [place], [place] [address]          Radiology Studies: DG Chest Portable 1 View  Result Date: 12/19/2019 CLINICAL DATA:  Shortness of breath, weakness EXAM: PORTABLE CHEST 1 VIEW COMPARISON:  03/22/2018 FINDINGS: Mild cardiomegaly, unchanged. Calcific aortic knob. Mild pulmonary vascular congestion. Small right pleural effusion. Probable trace left pleural effusion. No focal airspace consolidation. No pneumothorax. Degenerative changes of the shoulders. IMPRESSION: Cardiomegaly with mild pulmonary vascular congestion and small right pleural effusion. Electronically Signed   By: Davina Poke D.O.   On: 12/19/2019 13:24        Scheduled Meds: . sodium chloride   Intravenous Once  . amiodarone  150 mg Intravenous Once  . metoprolol succinate  25 mg Oral Daily  . pantoprazole (PROTONIX) IV  40 mg Intravenous Q12H  . polyethylene glycol-electrolytes  4,000 mL Oral  Once  . pravastatin  40 mg Oral QPM   Continuous Infusions: . amiodarone 30 mg/hr (12/20/19 0507)     LOS: 1 day    Time spent: 35 mins.More than 50% of that time was spent in counseling and/or coordination of care.      Shelly Coss, MD [facility] P3/13/2021, 8:21 AM

## 2019-12-21 ENCOUNTER — Encounter (HOSPITAL_COMMUNITY)
Admission: EM | Disposition: A | Discharge status: Home Health | Payer: Self-pay | Source: Ambulatory Visit | Attending: Student

## 2019-12-21 LAB — CBC WITH DIFFERENTIAL/PLATELET
Abs Immature Granulocytes: 0.14 10*3/uL — ABNORMAL HIGH (ref 0.00–0.07)
Basophils Absolute: 0 10*3/uL (ref 0.0–0.1)
Basophils Relative: 0 %
Eosinophils Absolute: 0 10*3/uL (ref 0.0–0.5)
Eosinophils Relative: 0 %
HCT: 24 % — ABNORMAL LOW (ref 39.0–52.0)
Hemoglobin: 7.5 g/dL — ABNORMAL LOW (ref 13.0–17.0)
Immature Granulocytes: 1 %
Lymphocytes Relative: 4 %
Lymphs Abs: 1 10*3/uL (ref 0.7–4.0)
MCH: 26.1 pg (ref 26.0–34.0)
MCHC: 31.3 g/dL (ref 30.0–36.0)
MCV: 83.6 fL (ref 80.0–100.0)
Monocytes Absolute: 2.5 10*3/uL — ABNORMAL HIGH (ref 0.1–1.0)
Monocytes Relative: 12 %
Neutro Abs: 17.9 10*3/uL — ABNORMAL HIGH (ref 1.7–7.7)
Neutrophils Relative %: 83 %
Platelets: 279 10*3/uL (ref 150–400)
RBC: 2.87 MIL/uL — ABNORMAL LOW (ref 4.22–5.81)
RDW: 18.6 % — ABNORMAL HIGH (ref 11.5–15.5)
WBC: 21.6 10*3/uL — ABNORMAL HIGH (ref 4.0–10.5)
nRBC: 1.3 % — ABNORMAL HIGH (ref 0.0–0.2)

## 2019-12-21 LAB — BASIC METABOLIC PANEL
Anion gap: 13 (ref 5–15)
BUN: 61 mg/dL — ABNORMAL HIGH (ref 8–23)
CO2: 19 mmol/L — ABNORMAL LOW (ref 22–32)
Calcium: 8 mg/dL — ABNORMAL LOW (ref 8.9–10.3)
Chloride: 93 mmol/L — ABNORMAL LOW (ref 98–111)
Creatinine, Ser: 2.05 mg/dL — ABNORMAL HIGH (ref 0.61–1.24)
GFR calc Af Amer: 35 mL/min — ABNORMAL LOW (ref >60)
GFR calc non Af Amer: 31 mL/min — ABNORMAL LOW (ref >60)
Glucose, Bld: 156 mg/dL — ABNORMAL HIGH (ref 70–99)
Potassium: 4.1 mmol/L (ref 3.5–5.1)
Sodium: 125 mmol/L — ABNORMAL LOW (ref 135–145)

## 2019-12-21 LAB — FERRITIN: Ferritin: 23 ng/mL — ABNORMAL LOW (ref 24–336)

## 2019-12-21 LAB — IRON AND TIBC
Iron: 9 ug/dL — ABNORMAL LOW (ref 45–182)
Saturation Ratios: 2 % — ABNORMAL LOW (ref 17.9–39.5)
TIBC: 449 ug/dL (ref 250–450)
UIBC: 440 ug/dL

## 2019-12-21 LAB — SODIUM, URINE, RANDOM: Sodium, Ur: <10 mmol/L

## 2019-12-21 LAB — CREATININE, URINE, RANDOM: Creatinine, Urine: 63.82 mg/dL

## 2019-12-21 LAB — PROTIME-INR
INR: 1.9 — ABNORMAL HIGH (ref 0.8–1.2)
Prothrombin Time: 21.6 seconds — ABNORMAL HIGH (ref 11.4–15.2)

## 2019-12-21 LAB — PROTEIN, URINE, RANDOM: Total Protein, Urine: 8 mg/dL

## 2019-12-21 SURGERY — ESOPHAGOGASTRODUODENOSCOPY (EGD) WITH PROPOFOL
Anesthesia: Monitor Anesthesia Care

## 2019-12-21 MED ORDER — SODIUM CHLORIDE 0.9 % IV SOLN: INTRAVENOUS | Status: DC

## 2019-12-21 NOTE — Evaluation (Signed)
Physical Therapy Evaluation Patient Details Name: Larry [name] MRN: [id number] DOB: 03/18/1941 Today's Date: 12/21/2019   History of Present Illness  Pt adm with weakness and found to have upper GI bleed, AKI, and afib with RVR. PMH - cad, ckd, htn, chf, a flutter  Clinical Impression  Pt admitted with above diagnosis and presents to PT with functional limitations due to deficits listed below (See PT problem list). Pt needs skilled PT to maximize independence and safety to allow discharge to home with supportive family and further rehab with HHPT.      Follow Up Recommendations Home health PT;Supervision/Assistance - 24 hour    Equipment Recommendations  None recommended by PT    Recommendations for Other Services       Precautions / Restrictions Precautions Precautions: Fall      Mobility  Bed Mobility Overal bed mobility: Needs Assistance Bed Mobility: Supine to Sit     Supine to sit: Min assist     General bed mobility comments: Assist to elevate trunk into standing  Transfers Overall transfer level: Needs assistance Equipment used: Rolling walker (2 wheeled) Transfers: Sit to/from Stand Sit to Stand: Min assist         General transfer comment: Assist to bring hips up and for balance. Verbal cues for hand placement  Ambulation/Gait Ambulation/Gait assistance: Min assist Gait Distance (Feet): 80 Feet Assistive device: Rolling walker (2 wheeled) Gait Pattern/deviations: Step-through pattern;Decreased step length - right;Decreased step length - left;Shuffle;Trunk flexed Gait velocity: decr Gait velocity interpretation: <1.31 ft/sec, indicative of household ambulator General Gait Details: Assist for balance. Verbal cues to stay closer to walker and stand more erect  Stairs            Wheelchair Mobility    Modified Rankin (Stroke Patients Only)       Balance Overall balance assessment: Needs assistance Sitting-balance support: No upper  extremity supported;Feet supported Sitting balance-Leahy Scale: Good     Standing balance support: Bilateral upper extremity supported Standing balance-Leahy Scale: Poor Standing balance comment: walker and min assist for static standing                             Pertinent Vitals/Pain Pain Assessment: No/denies pain    Home Living Family/patient expects to be discharged to:: Private residence Living Arrangements: Spouse/significant other Available Help at Discharge: Family;Available 24 hours/day Type of Home: House Home Access: Stairs to enter Entrance Stairs-Rails: Right Entrance Stairs-Number of Steps: 3 Home Layout: Two level;Able to live on main level with bedroom/bathroom Home Equipment: Kasandra Knudsen - single point;Walker - 2 wheels;Wheelchair - manual;Shower seat      Prior Function Level of Independence: Independent with assistive device(s)         Comments: Uses cane when out     Hand Dominance        Extremity/Trunk Assessment   Upper Extremity Assessment Upper Extremity Assessment: Defer to OT evaluation    Lower Extremity Assessment Lower Extremity Assessment: Generalized weakness       Communication   Communication: No difficulties  Cognition Arousal/Alertness: Awake/alert Behavior During Therapy: WFL for tasks assessed/performed Overall Cognitive Status: Within Functional Limits for tasks assessed                                        General Comments      Exercises  Assessment/Plan    PT Assessment Patient needs continued PT services  PT Problem List Decreased strength;Decreased activity tolerance;Decreased balance;Decreased mobility;Decreased knowledge of use of DME       PT Treatment Interventions DME instruction;Gait training;Stair training;Functional mobility training;Therapeutic activities;Therapeutic exercise;Balance training;Patient/family education    PT Goals (Current goals can be found in the Care  Plan section)  Acute Rehab PT Goals Patient Stated Goal: return home and get stronger PT Goal Formulation: With patient/family Time For Goal Achievement: 01/04/20 Potential to Achieve Goals: Good    Frequency Min 3X/week   Barriers to discharge Inaccessible home environment stairs to enter    Co-evaluation               AM-PAC PT "6 Clicks" Mobility  Outcome Measure Help needed turning from your back to your side while in a flat bed without using bedrails?: A Little Help needed moving from lying on your back to sitting on the side of a flat bed without using bedrails?: A Little Help needed moving to and from a bed to a chair (including a wheelchair)?: A Little Help needed standing up from a chair using your arms (e.g., wheelchair or bedside chair)?: A Little Help needed to walk in hospital room?: A Little Help needed climbing 3-5 steps with a railing? : A Lot 6 Click Score: 17    End of Session Equipment Utilized During Treatment: Gait belt Activity Tolerance: Patient limited by fatigue Patient left: in chair;with call bell/phone within reach;with family/visitor present Nurse Communication: Mobility status PT Visit Diagnosis: Unsteadiness on feet (R26.81);Muscle weakness (generalized) (M62.81)    Time: 1497-0263 PT Time Calculation (min) (ACUTE ONLY): 20 min   Charges:   PT Evaluation $PT Eval Moderate Complexity: [place] Pager [phone] Office [place] 12/21/2019, 2:04 PM

## 2019-12-21 NOTE — Progress Notes (Signed)
PROGRESS NOTE    Larry [name]  NKN:[id number] DOB: 10-21-1940 DOA: 12/19/2019 PCP: Patient, No Pcp Per   Brief Narrative:  Patient is a 53-year male with history of coronary artery disease, CKD stage IIIb, Hypertension, chronic diastolic congestive heart failure, atrial flutter, hyperlipidemia who presents with weakness.  Or the last couple of days, he was too weak to get out of the bed.  He also had diarrhea with dark stools.  He follows with cardiology for his atrial flutter.  On presentation, his stool was found to be Hemoccult positive, had AKI, hemoglobin of 5.8, A. fib with RVR.  Cardiology and GI consulted.  Patient denied EGD/colonoscopy.  Assessment & Plan:   Principal Problem:   Acute upper GI bleeding Active Problems:   HYPERCHOLESTEROLEMIA   Hypertension   Acute diastolic CHF (congestive heart failure) (HCC)   Atrial fibrillation with RVR (HCC)   Stage 3b chronic kidney disease    Upper GI bleed: FOBT positive.  Hemoglobin 5.8 on presentation.  GI consulted.  He was transfused with 2 units of PRBCs.  Started on Protonix IV.  Xarelto on hold.  INR was supratherapeutic so he was given vitamin K. Hemoglobin this morning is in the range of 7.  He firmly denies undergoing endoscopy and colonoscopy during this hospitalization.  His wife also does not want to proceed with this.  They want to follow as an outpatient for these procedures after he becomes more 'strong'. We will check iron studies and give iron if low.  A. fib with RVR: Follows with cardiology as an outpatient.  He was previously on Tikosyn but was stopped due to prolonged QTC.  Cardiology following.  Started on amiodarone drip.  Also on metoprolol.  Xarelto held  due to GI bleed.  This morning he was in normal sinus rhythm.  AKI on CKD stage IIIb: Creatinine up from baseline.  His baseline creatinine is 1.5.  Clinically he is dehydrated.  Initiated IV fluids  Continue to monitor. Patient denied  renal  ultrasound.Check urine sodium, urine protein.  We will continue gentle IV fluids for today.  Acute on chronic combined CHF: Echo on 5/19 showed  ejection fraction 50-55%.  Appeared volume depleted.  Diuretics on hold.  Hypertension: Currently blood pressure soft.  Toprol on hold.  Hyperlipidemia: On Pravachol  Leukocytosis: Most likely reactive.  No indication for antibiotic therapy for now.  Continue to monitor.  Severe hyponatremia: Could be issues with volume depletion versus SIADH.  Check random urine sodium.  Check BMP tomorrow.  generalized weakness: Requested for physical therapy assessment      DVT prophylaxis:SCD Code Status: Full Family Communication: Wife at the bdside Disposition Plan: Patient is from home.  PT/OT evaluation pending.  He is still on amio drip   Consultants: GI, cardiology  Procedures: None  Antimicrobials:  Anti-infectives (From admission, onward)   None      Subjective: Patient seen and examined the bedside this morning.  Hemodynamically stable.  Feels better today.  No GI bleed after admission.  Denies any nausea, vomiting or abdominal pain.  Denies any shortness of breath.  Continues to deny EGD/colonoscopy  Objective: Vitals:   12/20/19 2000 12/21/19 0033 12/21/19 0430 12/21/19 0541  BP: 96/66 (!) 91/59 (!) 89/54 (!) 96/58  Pulse: 97 94 87   Resp: 18 18 18    Temp: 97.7 F (36.5 C) 97.8 F (36.6 C) 97.6 F (36.4 C)   TempSrc: Oral  Oral   SpO2: 97% 97% 95%  Weight:   79.5 kg   Height:        Intake/Output Summary (Last 24 hours) at 12/21/2019 0758 Last data filed at 12/21/2019 0700 Gross per 24 hour  Intake 2851.15 ml  Output 1430 ml  Net 1421.15 ml   Filed Weights   12/19/19 1845 12/20/19 0513 12/21/19 0430  Weight: 76.8 kg 78.7 kg 79.5 kg    Examination:  General exam: Generalized weakness Respiratory system: Bilateral equal air entry, normal vesicular breath sounds, no wheezes or crackles  Cardiovascular system: S1  & S2 heard, RRR. No JVD, murmurs, rubs, gallops or clicks. Gastrointestinal system: Abdomen is nondistended, soft and nontender. No organomegaly or masses felt. Normal bowel sounds heard. Central nervous system: Alert and oriented. No focal neurological deficits. Extremities: No edema, no clubbing ,no cyanosis, distal peripheral pulses palpable. Skin: No rashes, lesions or ulcers,no icterus ,no pallor   Data Reviewed: I have personally reviewed following labs and imaging studies  CBC: Recent Labs  Lab 12/19/19 1300 12/19/19 2031 12/20/19 0747 12/21/19 0453  WBC 14.5* 10.5 15.7* 21.6*  NEUTROABS 11.4*  --   --  17.9*  HGB 5.8* 6.5* 7.8* 7.5*  HCT 20.1* 21.1* 24.6* 24.0*  MCV 84.8 83.7 83.7 83.6  PLT 336 279 290 026   Basic Metabolic Panel: Recent Labs  Lab 12/19/19 1300 12/20/19 0747 12/21/19 0453  NA 129* 128* 125*  K 5.6* 4.9 4.1  CL 94* 94* 93*  CO2 19* 21* 19*  GLUCOSE 133* 142* 156*  BUN 83* 80* 61*  CREATININE 2.20* 2.31* 2.05*  CALCIUM 9.1 8.5* 8.0*   GFR: Estimated Creatinine Clearance: 33.6 mL/min (A) (by C-G formula based on SCr of 2.05 mg/dL (H)). Liver Function Tests: Recent Labs  Lab 12/19/19 1500  AST 19  ALT 13  ALKPHOS 101  BILITOT 2.1*  PROT 6.1*  ALBUMIN 3.0*   No results for input(s): LIPASE, AMYLASE in the last 168 hours. No results for input(s): AMMONIA in the last 168 hours. Coagulation Profile: Recent Labs  Lab 12/19/19 1300 12/20/19 0747 12/21/19 0453  INR 4.1* 2.4* 1.9*   Cardiac Enzymes: No results for input(s): CKTOTAL, CKMB, CKMBINDEX, TROPONINI in the last 168 hours. BNP (last 3 results) No results for input(s): PROBNP in the last 8760 hours. HbA1C: No results for input(s): HGBA1C in the last 72 hours. CBG: No results for input(s): GLUCAP in the last 168 hours. Lipid Profile: No results for input(s): CHOL, HDL, LDLCALC, TRIG, CHOLHDL, LDLDIRECT in the last 72 hours. Thyroid Function Tests: No results for input(s):  TSH, T4TOTAL, FREET4, T3FREE, THYROIDAB in the last 72 hours. Anemia Panel: No results for input(s): VITAMINB12, FOLATE, FERRITIN, TIBC, IRON, RETICCTPCT in the last 72 hours. Sepsis Labs: No results for input(s): PROCALCITON, LATICACIDVEN in the last 168 hours.  Recent Results (from the past 240 hour(s))  SARS CORONAVIRUS 2 (TAT 6-24 HRS) Nasopharyngeal Nasopharyngeal Swab     Status: None   Collection Time: 12/19/19  1:53 PM   Specimen: Nasopharyngeal Swab  Result Value Ref Range Status   SARS Coronavirus 2 NEGATIVE NEGATIVE Final    Comment: (NOTE) SARS-CoV-2 target nucleic acids are NOT DETECTED. The SARS-CoV-2 RNA is generally detectable in upper and lower respiratory specimens during the acute phase of infection. Negative results do not preclude SARS-CoV-2 infection, do not rule out co-infections with other pathogens, and should not be used as the sole basis for treatment or other patient management decisions. Negative results must be combined with clinical observations, patient history, and  epidemiological information. The expected result is Negative. Fact Sheet for Patients: SugarRoll.be Fact Sheet for Healthcare Providers: https://www.woods-mathews.com/ This test is not yet approved or cleared by the [place] FDA and  has been authorized for detection and/or diagnosis of SARS-CoV-2 by FDA under an Emergency Use Authorization (EUA). This EUA will remain  in effect (meaning this test can be used) for the duration of the COVID-19 declaration under Section 56 4(b)(1) of the Act, 21 U.S.C. section 360bbb-3(b)(1), unless the authorization is terminated or revoked sooner. Performed at [facility], [place] [address], [place], [place] [address]          Radiology Studies: DG Chest Portable 1 View  Result Date: 12/19/2019 CLINICAL DATA:  Shortness of breath, weakness EXAM: PORTABLE CHEST 1 VIEW COMPARISON:  03/22/2018  FINDINGS: Mild cardiomegaly, unchanged. Calcific aortic knob. Mild pulmonary vascular congestion. Small right pleural effusion. Probable trace left pleural effusion. No focal airspace consolidation. No pneumothorax. Degenerative changes of the shoulders. IMPRESSION: Cardiomegaly with mild pulmonary vascular congestion and small right pleural effusion. Electronically Signed   By: Davina Poke D.O.   On: 12/19/2019 13:24        Scheduled Meds: . sodium chloride   Intravenous Once  . amiodarone  150 mg Intravenous Once  . metoprolol succinate  25 mg Oral Daily  . pantoprazole (PROTONIX) IV  40 mg Intravenous Q12H  . polyethylene glycol-electrolytes  4,000 mL Oral Once  . pravastatin  40 mg Oral QPM   Continuous Infusions: . amiodarone 30 mg/hr (12/21/19 0537)     LOS: 2 days    Time spent: 35 mins.More than 50% of that time was spent in counseling and/or coordination of care.      Shelly Coss, MD [facility] P3/14/2021, 7:58 AM

## 2019-12-21 NOTE — Progress Notes (Addendum)
Progress Note  Patient Name: Larry [name] Date of Encounter: 12/21/2019  Primary Cardiologist: No primary care provider on file.   Subjective   Patient seen with his wife present. Thorough discussion had about cardiovascular care and blood loss anemia. Spoke to patient's daughter Baxter Flattery on phone for 15 minutes as well reviewing overall medical state and cardiovascular care in room with patient and wife.   Inpatient Medications    Scheduled Meds: . sodium chloride   Intravenous Once  . amiodarone  150 mg Intravenous Once  . metoprolol succinate  25 mg Oral Daily  . pantoprazole (PROTONIX) IV  40 mg Intravenous Q12H  . polyethylene glycol-electrolytes  4,000 mL Oral Once  . pravastatin  40 mg Oral QPM   Continuous Infusions: . sodium chloride 75 mL/hr at 12/21/19 0955  . amiodarone 30 mg/hr (12/21/19 0537)   PRN Meds: acetaminophen **OR** acetaminophen, ondansetron **OR** ondansetron (ZOFRAN) IV   Vital Signs    Vitals:   12/21/19 0033 12/21/19 0430 12/21/19 0541 12/21/19 0927  BP: (!) 91/59 (!) 89/54 (!) 96/58 93/69  Pulse: 94 87  82  Resp: 18 18  20   Temp: 97.8 F (36.6 C) 97.6 F (36.4 C)  (!) 97.5 F (36.4 C)  TempSrc:  Oral  Oral  SpO2: 97% 95%  97%  Weight:  79.5 kg    Height:        Intake/Output Summary (Last 24 hours) at 12/21/2019 1217 Last data filed at 12/21/2019 0900 Gross per 24 hour  Intake 2491.15 ml  Output 1430 ml  Net 1061.15 ml   Last 3 Weights 12/21/2019 12/20/2019 12/19/2019  Weight (lbs) 175 lb 3.2 oz 173 lb 8 oz 169 lb 6.4 oz  Weight (kg) 79.47 kg 78.7 kg 76.839 kg      Telemetry    Afib, rates 90s - Personally Reviewed  ECG    Afib, rate 116 12/19/19- Personally Reviewed  Physical Exam   GEN: No acute distress.   Neck: No JVD Cardiac: irregular rhythm, tachycardic, 1/6 systolic murmur, rubs, or gallops.  Respiratory: Clear to auscultation bilaterally. GI: Soft, nontender, non-distended  MS: Trace pretibial edema; No deformity.  R shin dressed. Neuro:  Nonfocal  Psych: Normal affect   Labs    High Sensitivity Troponin:  No results for input(s): TROPONINIHS in the last 720 hours.    Chemistry Recent Labs  Lab 12/19/19 1300 12/19/19 1500 12/20/19 0747 12/21/19 0453  NA 129*  --  128* 125*  K 5.6*  --  4.9 4.1  CL 94*  --  94* 93*  CO2 19*  --  21* 19*  GLUCOSE 133*  --  142* 156*  BUN 83*  --  80* 61*  CREATININE 2.20*  --  2.31* 2.05*  CALCIUM 9.1  --  8.5* 8.0*  PROT  --  6.1*  --   --   ALBUMIN  --  3.0*  --   --   AST  --  19  --   --   ALT  --  13  --   --   ALKPHOS  --  101  --   --   BILITOT  --  2.1*  --   --   GFRNONAA 28*  --  26* 31*  GFRAA 33*  --  31* 35*  ANIONGAP 16*  --  13 13     Hematology Recent Labs  Lab 12/19/19 2031 12/20/19 0747 12/21/19 0453  WBC 10.5 15.7* 21.6*  RBC 2.52*  2.94* 2.87*  HGB 6.5* 7.8* 7.5*  HCT 21.1* 24.6* 24.0*  MCV 83.7 83.7 83.6  MCH 25.8* 26.5 26.1  MCHC 30.8 31.7 31.3  RDW 18.0* 18.2* 18.6*  PLT 279 290 279    BNP Recent Labs  Lab 12/19/19 1404  BNP 192.2*     DDimer No results for input(s): DDIMER in the last 168 hours.   Radiology    DG Chest Portable 1 View  Result Date: 12/19/2019 CLINICAL DATA:  Shortness of breath, weakness EXAM: PORTABLE CHEST 1 VIEW COMPARISON:  03/22/2018 FINDINGS: Mild cardiomegaly, unchanged. Calcific aortic knob. Mild pulmonary vascular congestion. Small right pleural effusion. Probable trace left pleural effusion. No focal airspace consolidation. No pneumothorax. Degenerative changes of the shoulders. IMPRESSION: Cardiomegaly with mild pulmonary vascular congestion and small right pleural effusion. Electronically Signed   By: Davina Poke D.O.   On: 12/19/2019 13:24    Cardiac Studies   Echo pending  Patient Profile     Larry [name] is a 79 y.o. male with a PMH of CAD s/p PCI to RCA in 2000 with subsequent STEMI later that year for ISR managed with repeat PCI to RCA, chronic diastolic CHF,  paroxysmal atrial fibrillation/flutter, HTN, HLD, OSA on CPAP, and CKD stage 3 who is being seen today for the evaluation of atrial fibrillation/flutter at the request of Dr. Lorin Mercy.  Assessment & Plan      1. Atrial flutter with RVR and variable block: known history. Recently stopped Tikosyn due to QT prolongation. Has been back in atrial flutter since at least 12/16/19. He is historically unaware of his atrial fib/flutter. BP has been soft limiting rate control over the past several days due to underlying diarrhea related to acute GI bleed.  - will repeat ecg for QTc while on amiodarone. - Can continue metoprolol as BP will allow - continue amiodarone IV - hold xarelto until cleared by GI to resume. I discussed alternate DOAC vs warfarin vs no AC in detail with family and discussed that this is a complex decision and will require further information about his GI bleeding to make a final choice. - patient and family are deferring GI evaluation due to concern of risks of colonoscopy and their feeling that his acute bleeding has stopped and they would rather he get stronger before having an EGD/colonscopy. We discussed need for Hb >8 with CAD and it will be challenging for him to feel strong when he remains severely anemic. I discussed with them that until we define a source of bleeding it will be challenging to discuss restarting xarelto with how low his Hb dropped with initial bleed. We discussed this is great detail. Will revisit day by day per family wishes.  2. Acute GI bleed: he notes frank blood in stool Tuesday/Wednesday this week. No recurrent diarrhea yesterday or today. Hbg 5.8 on admission. Stool guaiac positive. Received PRBC. - Continue to monitor Hgb closely and replete to maintain >8 given CAD history - hold xarelto - Continue to monitor volume status closely with transfusions, appears euvolemic today.  3. Chronic diastolic CHF: volume status appears stable. Likely dry on presentation  due to recent diarrhea. Cr elevated from baseline, now 2.31. BNP in the 100s. - Can hold torsemide again today. Revisit daily. - Monitor volume status closely with transfusions and volume repletion - Home spironolactone on hold given hypotension  4. CAD s/p PCI to RCA in 2000: no complaints of chest pain/SOB. Not on aspirin given need for anticoagulation - Continue statin  5. HLD: - Continue statin.  6. CKD stage 3: Cr improving. - Continue to monitor closely   Thorough discussion had with patient and his wife regarding cardiovascular issues and extracardiac factors at play.  For questions or updates, please contact [place] Please consult [email or web address] for contact info under        Signed, Elouise Munroe, MD  12/21/2019, 12:17 PM

## 2019-12-22 ENCOUNTER — Telehealth: Payer: PPO | Admitting: Internal Medicine

## 2019-12-22 ENCOUNTER — Encounter (HOSPITAL_COMMUNITY): Payer: Self-pay

## 2019-12-22 ENCOUNTER — Inpatient Hospital Stay (HOSPITAL_COMMUNITY): Payer: PPO

## 2019-12-22 DIAGNOSIS — I5032 Chronic diastolic (congestive) heart failure: Secondary | ICD-10-CM

## 2019-12-22 DIAGNOSIS — I361 Nonrheumatic tricuspid (valve) insufficiency: Secondary | ICD-10-CM

## 2019-12-22 DIAGNOSIS — I4891 Unspecified atrial fibrillation: Secondary | ICD-10-CM

## 2019-12-22 DIAGNOSIS — I25118 Atherosclerotic heart disease of native coronary artery with other forms of angina pectoris: Secondary | ICD-10-CM

## 2019-12-22 DIAGNOSIS — I5031 Acute diastolic (congestive) heart failure: Secondary | ICD-10-CM

## 2019-12-22 LAB — CBC WITH DIFFERENTIAL/PLATELET
Abs Immature Granulocytes: 0.18 10*3/uL — ABNORMAL HIGH (ref 0.00–0.07)
Basophils Absolute: 0 10*3/uL (ref 0.0–0.1)
Basophils Relative: 0 %
Eosinophils Absolute: 0.1 10*3/uL (ref 0.0–0.5)
Eosinophils Relative: 0 %
HCT: 25.8 % — ABNORMAL LOW (ref 39.0–52.0)
Hemoglobin: 7.7 g/dL — ABNORMAL LOW (ref 13.0–17.0)
Immature Granulocytes: 1 %
Lymphocytes Relative: 5 %
Lymphs Abs: 0.9 10*3/uL (ref 0.7–4.0)
MCH: 25.4 pg — ABNORMAL LOW (ref 26.0–34.0)
MCHC: 29.8 g/dL — ABNORMAL LOW (ref 30.0–36.0)
MCV: 85.1 fL (ref 80.0–100.0)
Monocytes Absolute: 2.7 10*3/uL — ABNORMAL HIGH (ref 0.1–1.0)
Monocytes Relative: 13 %
Neutro Abs: 16.3 10*3/uL — ABNORMAL HIGH (ref 1.7–7.7)
Neutrophils Relative %: 81 %
Platelets: 285 10*3/uL (ref 150–400)
RBC: 3.03 MIL/uL — ABNORMAL LOW (ref 4.22–5.81)
RDW: 19.4 % — ABNORMAL HIGH (ref 11.5–15.5)
WBC: 20.2 10*3/uL — ABNORMAL HIGH (ref 4.0–10.5)
nRBC: 1.3 % — ABNORMAL HIGH (ref 0.0–0.2)

## 2019-12-22 LAB — OSMOLALITY, URINE: Osmolality, Ur: 380 mOsm/kg (ref 300–900)

## 2019-12-22 LAB — ECHOCARDIOGRAM COMPLETE
Height: 72 in
Weight: 2912 oz

## 2019-12-22 LAB — BASIC METABOLIC PANEL
Anion gap: 14 (ref 5–15)
BUN: 59 mg/dL — ABNORMAL HIGH (ref 8–23)
CO2: 17 mmol/L — ABNORMAL LOW (ref 22–32)
Calcium: 8 mg/dL — ABNORMAL LOW (ref 8.9–10.3)
Chloride: 92 mmol/L — ABNORMAL LOW (ref 98–111)
Creatinine, Ser: 2.12 mg/dL — ABNORMAL HIGH (ref 0.61–1.24)
GFR calc Af Amer: 34 mL/min — ABNORMAL LOW (ref >60)
GFR calc non Af Amer: 29 mL/min — ABNORMAL LOW (ref >60)
Glucose, Bld: 126 mg/dL — ABNORMAL HIGH (ref 70–99)
Potassium: 4.1 mmol/L (ref 3.5–5.1)
Sodium: 123 mmol/L — ABNORMAL LOW (ref 135–145)

## 2019-12-22 LAB — OSMOLALITY: Osmolality: 281 mOsm/kg (ref 275–295)

## 2019-12-22 LAB — SODIUM, URINE, RANDOM: Sodium, Ur: <10 mmol/L

## 2019-12-22 MED ORDER — FERROUS SULFATE 325 (65 FE) MG PO TABS
325.0000 mg | ORAL_TABLET | Freq: Every day | ORAL | Status: DC
  Filled 2019-12-22: qty 1

## 2019-12-22 MED ORDER — FUROSEMIDE 10 MG/ML IJ SOLN: 60.0000 mg | Freq: Two times a day (BID) | INTRAMUSCULAR | Status: DC

## 2019-12-22 MED ORDER — TOLVAPTAN 15 MG PO TABS
15.0000 mg | ORAL_TABLET | ORAL | Status: DC
  Administered 2019-12-22 – 2019-12-23 (×2): 15 mg via ORAL
  Filled 2019-12-22 (×3): qty 1

## 2019-12-22 MED ORDER — SODIUM CHLORIDE 0.9 % IV SOLN
510.0000 mg | Freq: Once | INTRAVENOUS | Status: DC
  Filled 2019-12-22: qty 17

## 2019-12-22 MED ORDER — SODIUM CHLORIDE 0.9 % IV SOLN
510.0000 mg | Freq: Once | INTRAVENOUS | Status: AC
  Administered 2019-12-22: 510 mg via INTRAVENOUS
  Filled 2019-12-22: qty 17

## 2019-12-22 MED ORDER — FUROSEMIDE 10 MG/ML IJ SOLN
60.0000 mg | Freq: Two times a day (BID) | INTRAMUSCULAR | Status: DC
  Administered 2019-12-22 – 2019-12-24 (×4): 60 mg via INTRAVENOUS
  Filled 2019-12-22 (×5): qty 6

## 2019-12-22 MED ORDER — AMIODARONE HCL 200 MG PO TABS
400.0000 mg | ORAL_TABLET | Freq: Two times a day (BID) | ORAL | Status: DC
  Administered 2019-12-22 – 2019-12-30 (×15): 400 mg via ORAL
  Filled 2019-12-22 (×18): qty 2

## 2019-12-22 NOTE — Progress Notes (Addendum)
PROGRESS NOTE    Larry [name]  YKD:[id number] DOB: 1941-09-06 DOA: 12/19/2019 PCP: Patient, No Pcp Per   Brief Narrative:  Patient is a 79-year male with history of coronary artery disease, CKD stage IIIb, Hypertension, chronic diastolic congestive heart failure, atrial flutter, hyperlipidemia who presents with weakness.  Or the last couple of days, he was too weak to get out of the bed.  He also had diarrhea with dark stools.  He follows with cardiology for his atrial flutter.  On presentation, his stool was found to be Hemoccult positive, had AKI, hemoglobin of 5.8, A. fib with RVR.  Cardiology and GI consulted.  Patient denied EGD/colonoscopy.  Hospital course significant for hyponatremia.  Assessment & Plan:   Principal Problem:   Acute upper GI bleeding Active Problems:   HYPERCHOLESTEROLEMIA   Hypertension   Acute diastolic CHF (congestive heart failure) (HCC)   Atrial fibrillation with RVR (HCC)   Stage 3b chronic kidney disease    Upper GI bleed: FOBT positive.  Hemoglobin 5.8 on presentation.  GI consulted.  He was transfused with 2 units of PRBCs.  Started on Protonix IV.  Xarelto on hold.  INR was supratherapeutic so he was given vitamin K. Hemoglobin this morning is in the range of 7.  He firmly denies undergoing endoscopy and colonoscopy during this hospitalization.  His wife/daughter  also does not want to proceed with this.  They want to follow as an outpatient for these procedures after he becomes more 'strong'. Iron studies showed severe iron deficiency,he was transfused with a dose of IV iron.  A. fib with RVR: Follows with cardiology as an outpatient.  He was previously on Tikosyn but was stopped due to prolonged QTC.  Cardiology following.  Started on amiodarone drip,which has been changed to oral.  He was also on metoprolol.  Xarelto held  due to GI bleed.  Rate  is much controlled now but he is still in A. Fib.  AKI on CKD stage IIIb: Creatinine up from baseline.   His baseline creatinine is 1.5.  Clinically he was dehydrated. Started on IV fluids.Patient denied  renal ultrasound.  Urine sodium less than 10.  Nephrology consulted today.  Hyponatremia: Sodium of 123 today.  Did not respond to IV fluids.  Nephrology will follow  Acute on chronic combined CHF: Echo on 5/19 showed  ejection fraction 50-55%.  Diuretics on hold.  Hypertension: Currently blood pressure soft.  Toprol on hold.  Hyperlipidemia: On Pravachol  Leukocytosis: Most likely reactive.  No indication for antibiotic therapy for now.  Continue to monitor.  generalized weakness: PT recommended home health     DVT prophylaxis:SCD Code Status: Full Family Communication: daughter at the bdside Disposition Plan: Patient is from home.  Not a stable for discharge because of severe hyponatremia, AKI.  Discharge planning to home with home health after clearance from cardiology and nephrology  Consultants: GI, cardiology  Procedures: None  Antimicrobials:  Anti-infectives (From admission, onward)   None      Subjective:  Patient seen and examined at the bedside this morning.  Hemodynamically stable.  Sitting in the chair.  Daughter was at the bedside.  Denies any shortness of breath.  No hematochezia/melena during this admission.  Continues to deny EGD/colonoscopy.  Objective: Vitals:   12/21/19 0927 12/21/19 1436 12/21/19 1929 12/22/19 0458  BP: 93/69 92/66 93/69  (!) 90/58  Pulse: 82 75 88 73  Resp: 20 20 18    Temp: (!) 97.5 F (36.4 C)  97.6 F (  36.4 C) (!) 97.4 F (36.3 C)  TempSrc: Oral  Oral Oral  SpO2: 97% 98% 100%   Weight:    82.6 kg  Height:        Intake/Output Summary (Last 24 hours) at 12/22/2019 0753 Last data filed at 12/22/2019 0730 Gross per 24 hour  Intake 2137.23 ml  Output 1050 ml  Net 1087.23 ml   Filed Weights   12/20/19 0513 12/21/19 0430 12/22/19 0458  Weight: 78.7 kg 79.5 kg 82.6 kg    Examination:  General exam: Generalized  weakness Respiratory system: bilateral basilar crackles Cardiovascular system: Irregularly irregular,  No JVD, murmurs, rubs, gallops or clicks. Gastrointestinal system: Abdomen is nondistended, soft and nontender. No organomegaly or masses felt. Normal bowel sounds heard. Central nervous system: Alert and oriented. No focal neurological deficits. Extremities: trace pedal edema, no clubbing ,no cyanosis Skin: No rashes, lesions or ulcers,no icterus ,no pallor   Data Reviewed: I have personally reviewed following labs and imaging studies  CBC: Recent Labs  Lab 12/19/19 1300 12/19/19 2031 12/20/19 0747 12/21/19 0453 12/22/19 0421  WBC 14.5* 10.5 15.7* 21.6* 20.2*  NEUTROABS 11.4*  --   --  17.9* 16.3*  HGB 5.8* 6.5* 7.8* 7.5* 7.7*  HCT 20.1* 21.1* 24.6* 24.0* 25.8*  MCV 84.8 83.7 83.7 83.6 85.1  PLT 336 279 290 279 097   Basic Metabolic Panel: Recent Labs  Lab 12/19/19 1300 12/20/19 0747 12/21/19 0453 12/22/19 0421  NA 129* 128* 125* 123*  K 5.6* 4.9 4.1 4.1  CL 94* 94* 93* 92*  CO2 19* 21* 19* 17*  GLUCOSE 133* 142* 156* 126*  BUN 83* 80* 61* 59*  CREATININE 2.20* 2.31* 2.05* 2.12*  CALCIUM 9.1 8.5* 8.0* 8.0*   GFR: Estimated Creatinine Clearance: 32.5 mL/min (A) (by C-G formula based on SCr of 2.12 mg/dL (H)). Liver Function Tests: Recent Labs  Lab 12/19/19 1500  AST 19  ALT 13  ALKPHOS 101  BILITOT 2.1*  PROT 6.1*  ALBUMIN 3.0*   No results for input(s): LIPASE, AMYLASE in the last 168 hours. No results for input(s): AMMONIA in the last 168 hours. Coagulation Profile: Recent Labs  Lab 12/19/19 1300 12/20/19 0747 12/21/19 0453  INR 4.1* 2.4* 1.9*   Cardiac Enzymes: No results for input(s): CKTOTAL, CKMB, CKMBINDEX, TROPONINI in the last 168 hours. BNP (last 3 results) No results for input(s): PROBNP in the last 8760 hours. HbA1C: No results for input(s): HGBA1C in the last 72 hours. CBG: No results for input(s): GLUCAP in the last 168  hours. Lipid Profile: No results for input(s): CHOL, HDL, LDLCALC, TRIG, CHOLHDL, LDLDIRECT in the last 72 hours. Thyroid Function Tests: No results for input(s): TSH, T4TOTAL, FREET4, T3FREE, THYROIDAB in the last 72 hours. Anemia Panel: Recent Labs    12/21/19 1132  FERRITIN 23*  TIBC 449  IRON 9*   Sepsis Labs: No results for input(s): PROCALCITON, LATICACIDVEN in the last 168 hours.  Recent Results (from the past 240 hour(s))  SARS CORONAVIRUS 2 (TAT 6-24 HRS) Nasopharyngeal Nasopharyngeal Swab     Status: None   Collection Time: 12/19/19  1:53 PM   Specimen: Nasopharyngeal Swab  Result Value Ref Range Status   SARS Coronavirus 2 NEGATIVE NEGATIVE Final    Comment: (NOTE) SARS-CoV-2 target nucleic acids are NOT DETECTED. The SARS-CoV-2 RNA is generally detectable in upper and lower respiratory specimens during the acute phase of infection. Negative results do not preclude SARS-CoV-2 infection, do not rule out co-infections with other pathogens, and  should not be used as the sole basis for treatment or other patient management decisions. Negative results must be combined with clinical observations, patient history, and epidemiological information. The expected result is Negative. Fact Sheet for Patients: SugarRoll.be Fact Sheet for Healthcare Providers: https://www.woods-mathews.com/ This test is not yet approved or cleared by the [place] FDA and  has been authorized for detection and/or diagnosis of SARS-CoV-2 by FDA under an Emergency Use Authorization (EUA). This EUA will remain  in effect (meaning this test can be used) for the duration of the COVID-19 declaration under Section 56 4(b)(1) of the Act, 21 U.S.C. section 360bbb-3(b)(1), unless the authorization is terminated or revoked sooner. Performed at [facility], [place] [address] [place], [place] [address]          Radiology Studies: No results  found.      Scheduled Meds: . sodium chloride   Intravenous Once  . amiodarone  150 mg Intravenous Once  . metoprolol succinate  25 mg Oral Daily  . pantoprazole (PROTONIX) IV  40 mg Intravenous Q12H  . polyethylene glycol-electrolytes  4,000 mL Oral Once  . pravastatin  40 mg Oral QPM   Continuous Infusions: . amiodarone 30 mg/hr (12/22/19 0544)  . ferumoxytol       LOS: 3 days    Time spent: 35 mins.More than 50% of that time was spent in counseling and/or coordination of care.      Shelly Coss, MD [facility] P3/15/2021, 7:53 AM

## 2019-12-22 NOTE — Progress Notes (Signed)
  Echocardiogram 2D Echocardiogram has been performed.  Randa Lynn Macaulay Reicher 12/22/2019, 12:26 PM

## 2019-12-22 NOTE — Addendum Note (Signed)
Encounter addended by: Sherran Needs, NP on: 12/22/2019 8:35 AM  Actions taken: Clinical Note Signed

## 2019-12-22 NOTE — Progress Notes (Addendum)
Progress Note  Patient Name: Larry [name] Date of Encounter: 12/22/2019  Primary Cardiologist: No primary care provider on file.   Subjective   No complaints this morning. Reports feeling weak.   Inpatient Medications    Scheduled Meds: . sodium chloride   Intravenous Once  . amiodarone  400 mg Oral BID  . pantoprazole (PROTONIX) IV  40 mg Intravenous Q12H  . polyethylene glycol-electrolytes  4,000 mL Oral Once  . pravastatin  40 mg Oral QPM   Continuous Infusions: . ferumoxytol     PRN Meds: acetaminophen **OR** acetaminophen, ondansetron **OR** ondansetron (ZOFRAN) IV   Vital Signs    Vitals:   12/21/19 1436 12/21/19 1929 12/22/19 0458 12/22/19 0924  BP: 92/66 93/69 (!) 90/58 100/62  Pulse: 75 88 73 79  Resp: 20 18  20   Temp:  97.6 F (36.4 C) (!) 97.4 F (36.3 C)   TempSrc:  Oral Oral   SpO2: 98% 100%    Weight:   82.6 kg   Height:        Intake/Output Summary (Last 24 hours) at 12/22/2019 0936 Last data filed at 12/22/2019 0900 Gross per 24 hour  Intake 2137.23 ml  Output 1050 ml  Net 1087.23 ml   Last 3 Weights 12/22/2019 12/21/2019 12/20/2019  Weight (lbs) 182 lb 175 lb 3.2 oz 173 lb 8 oz  Weight (kg) 82.555 kg 79.47 kg 78.7 kg      Telemetry    Aflutter, rate controlled - Personally Reviewed  ECG    No new tracing this morning.  Physical Exam  Older WM, laying in bed. GEN: No acute distress.   Neck: No JVD Cardiac: Irreg Irreg, soft systolic murmur, no rubs, or gallops.  Respiratory: Clear to auscultation bilaterally. GI: Soft, nontender, non-distended  MS: No edema; No deformity. Neuro:  Nonfocal  Psych: Normal affect   Labs    High Sensitivity Troponin:  No results for input(s): TROPONINIHS in the last 720 hours.    Chemistry Recent Labs  Lab 12/19/19 1300 12/19/19 1500 12/20/19 0747 12/21/19 0453 12/22/19 0421  NA   < >  --  128* 125* 123*  K   < >  --  4.9 4.1 4.1  CL   < >  --  94* 93* 92*  CO2   < >  --  21* 19* 17*    GLUCOSE   < >  --  142* 156* 126*  BUN   < >  --  80* 61* 59*  CREATININE   < >  --  2.31* 2.05* 2.12*  CALCIUM   < >  --  8.5* 8.0* 8.0*  PROT  --  6.1*  --   --   --   ALBUMIN  --  3.0*  --   --   --   AST  --  19  --   --   --   ALT  --  13  --   --   --   ALKPHOS  --  101  --   --   --   BILITOT  --  2.1*  --   --   --   GFRNONAA   < >  --  26* 31* 29*  GFRAA   < >  --  31* 35* 34*  ANIONGAP   < >  --  13 13 14    < > = values in this interval not displayed.     Hematology Recent Labs  Lab  12/20/19 0747 12/21/19 0453 12/22/19 0421  WBC 15.7* 21.6* 20.2*  RBC 2.94* 2.87* 3.03*  HGB 7.8* 7.5* 7.7*  HCT 24.6* 24.0* 25.8*  MCV 83.7 83.6 85.1  MCH 26.5 26.1 25.4*  MCHC 31.7 31.3 29.8*  RDW 18.2* 18.6* 19.4*  PLT 290 279 285    BNP Recent Labs  Lab 12/19/19 1404  BNP 192.2*     DDimer No results for input(s): DDIMER in the last 168 hours.   Radiology    No results found.  Cardiac Studies   Echo: pending  Patient Profile     79 y.o. male with a PMH of CAD s/p PCI to RCA in 2000 with subsequent STEMI later that year for ISR managed with repeat PCI to RCA, chronic diastolic CHF, paroxysmal atrial fibrillation/flutter, HTN, HLD, OSA on CPAP, and CKD stage 3who was seen for the evaluation ofatrial fibrillation/flutterat the request of Dr. Lorin Mercy.  Assessment & Plan    1. Atrial flutter with RVR and variable block:known history. Recently stopped Tikosyn due to QT prolongation. Has been back in atrial flutter since at least 12/16/19. He is historically unaware of his atrial fib/flutter.BP has been soft limiting rate control over the past several days due to underlying diarrhea related to acute GI bleed.  - rates are now controlled, but has not been receiving metoprolol 2/2 to hypotension. For now will transition to PO amiodarone. Unable to consider TEE/DCCV or ablation at this time as we are not able to restart Xarelto. Will focus on rate control for the time being.   -- continue to hold xarelto until cleared by GI to resume. Per notes, patient and family prefer to wait for EGD/colonscopy until he is stronger  2. Acute GI bleed/Anemia:he noted frank blood in stool Tuesday/Wednesday the week of admission. No recurrent diarrhea yesterday or today. Hbg 5.8 on admission. Stool guaiac positive. Received PRBC. - Continue to monitor Hgb closely and favor to maintain >8 given CAD history - Continue to monitor volume status closely with transfusions, remains euvolemic today. - had an iron infusion ordered, but is refusing. Will order PO iron for now. Defer further management to primary.  3. Chronic diastolic SJG:GEZMOQ status appears stable. Likely dry on presentation due to recent diarrhea. Cr elevated from baseline, trending down from 2.31>>2.12. BNP in the 100s. -- continue to hold diuretics  4. CAD s/p PCI to RCA in 2000:no complaints of chest pain/SOB. Not on aspirin given need for anticoagulation - Continue statin  5. HLD: - Continue statin.  6. CKD stage 3:Cr improving. - Continue to monitor closely  For questions or updates, please contact [place] Please consult [email or web address] for contact info under        Signed, Reino Bellis, NP  12/22/2019, 9:36 AM     I have examined the patient and reviewed assessment and plan and discussed with patient.  Agree with above as stated.  I discussed EGD/colonoscopy with him again today as a way to look for bleeding source.  He does not feel that he is strong enough.  FOr now, continue Amio as he is well rate controlled. No anticoag until GI w/u.      Larae Grooms   Addendum: Spoke with Dr. Aundra Dubin who sees the patient in CHF clinic.  He will see the patient today.   Jettie Booze, MD

## 2019-12-22 NOTE — Progress Notes (Signed)
Patient states he uses a cpap at home and requested one. Provider and respiratory notified.

## 2019-12-22 NOTE — Progress Notes (Signed)
Lab reported error in urine sodium. States sodium level is actually < 10. MD made aware.

## 2019-12-22 NOTE — Progress Notes (Signed)
Patient and daughter declines to take feraheme or oral iron until MD comes to speak to him for medical advice.

## 2019-12-22 NOTE — TOC Progression Note (Addendum)
Transition of Care [facility]) - Progression Note    Patient Details  Name: Larry [name] MRN: [id number] Date of Birth: [birth year]  [facility]) CM/SW Contact  Zenon Mayo, RN Phone Number: 12/22/2019, 2:38 PM  Clinical Narrative:    NCM spoke with patient and daughter, left medicare.gov agency with her this am, informed her I would be back to see who they chose for [facility] services.  NCM checked backed and daughter states they are still not ready to choose yet.  NCM called back at 2:38 pm to see he they were ready, daugjhter states he is asleep and they are not ready to make choice yet.  NCM called daughter in room she states the MD said he will be here a couple more days ,to check back wiith them then, because he may not need it by then per daugher.         Expected Discharge Plan and Services                                                 Social Determinants of Health (SDOH) Interventions    Readmission Risk Interventions No flowsheet data found.

## 2019-12-22 NOTE — Progress Notes (Signed)
Physical Therapy Treatment Patient Details Name: Larry [name] MRN: [id number] DOB: 11/24/1940 Today's Date: 12/22/2019    History of Present Illness Pt adm with weakness and found to have upper GI bleed, AKI, and afib with RVR. PMH - cad, ckd, htn, chf, a flutter    PT Comments    Patient seen for mobility progression. Pt tolerated gait distance of 100 ft before c/o bilat LE soreness. HR 89-123 bpm and pt on RA with SOB while ambulating.  Pt will continue to benefit from further skilled PT services to maximize independence and safety with mobility.    Follow Up Recommendations  Home health PT;Supervision/Assistance - 24 hour     Equipment Recommendations  None recommended by PT    Recommendations for Other Services       Precautions / Restrictions Precautions Precautions: Fall Restrictions Weight Bearing Restrictions: No    Mobility  Bed Mobility Overal bed mobility: Modified Independent Bed Mobility: Supine to Sit           General bed mobility comments: HOB elevated and use of rail  Transfers Overall transfer level: Needs assistance Equipment used: Rolling walker (2 wheeled) Transfers: Sit to/from Stand Sit to Stand: Min guard         General transfer comment: cues for safe hand placement  Ambulation/Gait Ambulation/Gait assistance: Min guard;Min assist Gait Distance (Feet): 100 Feet Assistive device: Rolling walker (2 wheeled) Gait Pattern/deviations: Step-through pattern;Trunk flexed;Decreased stride length Gait velocity: decr   General Gait Details: grossly min guard for safety and min A for balance when stepping backwards to recliner; cues for upright posture; distance limited by c/o sore bilat LE with mobility   Stairs             Wheelchair Mobility    Modified Rankin (Stroke Patients Only)       Balance Overall balance assessment: Needs assistance Sitting-balance support: No upper extremity supported;Feet supported Sitting  balance-Leahy Scale: Good     Standing balance support: Bilateral upper extremity supported Standing balance-Leahy Scale: Poor                              Cognition Arousal/Alertness: Awake/alert Behavior During Therapy: WFL for tasks assessed/performed Overall Cognitive Status: Within Functional Limits for tasks assessed                                        Exercises      General Comments General comments (skin integrity, edema, etc.): HR 89-123 bpm       Pertinent Vitals/Pain Pain Assessment: Faces Faces Pain Scale: Hurts a little bit Pain Location: bilat LE Pain Descriptors / Indicators: Sore Pain Intervention(s): Limited activity within patient's tolerance;Repositioned    Home Living                      Prior Function            PT Goals (current goals can now be found in the care plan section) Progress towards PT goals: Progressing toward goals    Frequency    Min 3X/week      PT Plan Current plan remains appropriate    Co-evaluation              AM-PAC PT "6 Clicks" Mobility   Outcome Measure  Help needed turning from your back to your  side while in a flat bed without using bedrails?: A Little Help needed moving from lying on your back to sitting on the side of a flat bed without using bedrails?: A Little Help needed moving to and from a bed to a chair (including a wheelchair)?: A Little Help needed standing up from a chair using your arms (e.g., wheelchair or bedside chair)?: A Little Help needed to walk in hospital room?: A Little Help needed climbing 3-5 steps with a railing? : A Lot 6 Click Score: 17    End of Session Equipment Utilized During Treatment: Gait belt Activity Tolerance: Patient tolerated treatment well Patient left: in chair;with call bell/phone within reach Nurse Communication: Mobility status PT Visit Diagnosis: Unsteadiness on feet (R26.81);Muscle weakness (generalized)  (M62.81)     Time: 2330-0762 PT Time Calculation (min) (ACUTE ONLY): 23 min  Charges:  $Gait Training: 23-37 mins                     Larry [name], PTA Acute Rehabilitation Services Pager: [phone] Office: [phone]     Larry [name] 12/22/2019, 9:58 AM

## 2019-12-22 NOTE — Consult Note (Signed)
[facility]  HISTORY AND PHYSICAL  Larry [name] is an 79 y.o. male.    Chief Complaint: weakness  HPI: Pt is a 87M with a PMH sig for HTN, HLD, Afib, chronic diastolic CHF with EF 39-76% in 02/2018 who is now seen in consultation at the request of Dr. Tawanna Solo for evaluation and recommendations re: AKI and hyponatremia.   Pt was in his usual state of health until 3/10 when he started having diarrhea and weakness.  Wasn't able to get out of bed.  He came to the ED where he was noted to have Hgb down to 5.8, in Afib, soft BP.  Was on Xarelto Cr was 2.2 on admission, and is 2.12 today, has been fluctuating some.  Got 2 u pRBCs and Hgb up to 7.7 now.  Has gotten NS IVFs as well.  Na 129-->128--> 125--. 123 today, prompting some eval.    Having good UOP.  Pt feels "fine" but has some SOB when getting up and walking around.  He has declined c-scope for now.  AHF has been consulted.      PMH: Past Medical History:  Diagnosis Date  . Coronary artery disease    status post remote PCI of the mid to distal RCA 11/1998 followed by acute ST elevation MI inferiorly 08/1999 with occlusion of the prior RCA stent as well as 70% diagonal branch.  He underwent PCI of the RCA  . Hypercholesterolemia   . Hypertension   . Myocardial infarct ([place])   . Olecranon bursitis    PSH: Past Surgical History:  Procedure Laterality Date  . CARDIAC CATHETERIZATION  12/15/1999   EF was 55%   . CORONARY ANGIOPLASTY WITH STENT PLACEMENT  08/15/1999   CAD, status post prior stenting of the mid to distal RCA in 11/1998, with an acute diaphragmatic wall infarction due to total occlusion at the stent site/There is also 70% narrowing in the diagonal branch of the LAD/The LV showed inferior wall hypo- akinesis.-- Successful reperfusion, percutaneous transluminal coronary percutaneous transluminal coronary & placement of a 2nd overlying stent in RCA    Past Medical History:  Diagnosis Date  . Coronary artery  disease    status post remote PCI of the mid to distal RCA 11/1998 followed by acute ST elevation MI inferiorly 08/1999 with occlusion of the prior RCA stent as well as 70% diagonal branch.  He underwent PCI of the RCA  . Hypercholesterolemia   . Hypertension   . Myocardial infarct ([place])   . Olecranon bursitis     Medications:   Scheduled: . sodium chloride   Intravenous Once  . amiodarone  400 mg Oral BID  . ferrous sulfate  325 mg Oral Q breakfast  . pantoprazole (PROTONIX) IV  40 mg Intravenous Q12H  . pravastatin  40 mg Oral QPM    Medications Prior to Admission  Medication Sig Dispense Refill  . acetaminophen (TYLENOL) 500 MG tablet Take 1,000 mg by mouth as needed for moderate pain (back pain).     Marland Kitchen loratadine (CLARITIN) 10 MG tablet Take 10 mg by mouth daily.    . metoprolol succinate (TOPROL XL) 25 MG 24 hr tablet Take 1 tablet (25 mg total) by mouth daily. 30 tablet 11  . nitroGLYCERIN (NITROSTAT) 0.4 MG SL tablet Place 1 tablet (0.4 mg total) under the tongue every 5 (five) minutes as needed for chest pain (3 doses max). 25 tablet 3  . potassium chloride (K-DUR) 20 MEQ tablet Take 3 tablets (60  mEq total) by mouth every morning AND 2 tablets (40 mEq total) every evening. 150 tablet 3  . pravastatin (PRAVACHOL) 40 MG tablet Take 1 tablet (40 mg total) by mouth every evening. 90 tablet 0  . spironolactone (ALDACTONE) 25 MG tablet Take 0.5 tablets (12.5 mg total) by mouth daily. 45 tablet 3  . torsemide (DEMADEX) 20 MG tablet Take 4 tablets (80 mg total) by mouth every morning AND 3 tablets (60 mg total) every evening. 210 tablet 5  . XARELTO 20 MG TABS tablet TAKE ONE TABLET (20 MG TOTAL) BY MOUTH DAILY WITH SUPPER 30 tablet 5    ALLERGIES:   Allergies  Allergen Reactions  . Plavix [Clopidogrel Bisulfate] Anaphylaxis         FAM HX: Family History  Problem Relation Age of Onset  . Heart Problems Mother   . Cancer Father     Social History:   reports that he  quit smoking about 20 years ago. His smoking use included cigarettes. He has never used smokeless tobacco. He reports that he does not drink alcohol or use drugs.  ROS: ROS: all other system reviewed and are negative except as per HPI  Blood pressure 95/72, pulse 74, temperature (!) 97.2 F (36.2 C), resp. rate 18, height 6' (1.829 m), weight 82.6 kg, SpO2 95 %. PHYSICAL EXAM: Physical Exam  GEN NAD, sitting in chair HEENT EOMI PERRL NECK + JVD PULM normal WOB, bibasilar crackles CV tachy ABD soft EXT 2+ LE edema NEURO AAO x 3 nonfocal SKIN no rashes   Results for orders placed or performed during the hospital encounter of 12/19/19 (from the past 48 hour(s))  CBC with Differential/Platelet     Status: Abnormal   Collection Time: 12/21/19  4:53 AM  Result Value Ref Range   WBC 21.6 (H) 4.0 - 10.5 K/uL   RBC 2.87 (L) 4.22 - 5.81 MIL/uL   Hemoglobin 7.5 (L) 13.0 - 17.0 g/dL   HCT 24.0 (L) 39.0 - 52.0 %   MCV 83.6 80.0 - 100.0 fL   MCH 26.1 26.0 - 34.0 pg   MCHC 31.3 30.0 - 36.0 g/dL   RDW 18.6 (H) 11.5 - 15.5 %   Platelets 279 150 - 400 K/uL   nRBC 1.3 (H) 0.0 - 0.2 %   Neutrophils Relative % 83 %   Neutro Abs 17.9 (H) 1.7 - 7.7 K/uL   Lymphocytes Relative 4 %   Lymphs Abs 1.0 0.7 - 4.0 K/uL   Monocytes Relative 12 %   Monocytes Absolute 2.5 (H) 0.1 - 1.0 K/uL   Eosinophils Relative 0 %   Eosinophils Absolute 0.0 0.0 - 0.5 K/uL   Basophils Relative 0 %   Basophils Absolute 0.0 0.0 - 0.1 K/uL   Immature Granulocytes 1 %   Abs Immature Granulocytes 0.14 (H) 0.00 - 0.07 K/uL    Comment: Performed at [facility], [address], [place], [place] [address]  Basic metabolic panel     Status: Abnormal   Collection Time: 12/21/19  4:53 AM  Result Value Ref Range   Sodium 125 (L) 135 - 145 mmol/L   Potassium 4.1 3.5 - 5.1 mmol/L   Chloride 93 (L) 98 - 111 mmol/L   CO2 19 (L) 22 - 32 mmol/L   Glucose, Bld 156 (H) 70 - 99 mg/dL    Comment: Glucose reference range  applies only to samples taken after fasting for at least 8 hours.   BUN 61 (H) 8 -  23 mg/dL   Creatinine, Ser 2.05 (H) 0.61 - 1.24 mg/dL   Calcium 8.0 (L) 8.9 - 10.3 mg/dL   GFR calc non Af Amer 31 (L) >60 mL/min   GFR calc Af Amer 35 (L) >60 mL/min   Anion gap 13 5 - 15    Comment: Performed at [facility] [address], [place], [place] [address]  Protime-INR     Status: Abnormal   Collection Time: 12/21/19  4:53 AM  Result Value Ref Range   Prothrombin Time 21.6 (H) 11.4 - 15.2 seconds   INR 1.9 (H) 0.8 - 1.2    Comment: (NOTE) INR goal varies based on device and disease states. Performed at [facility], [place] [address] [place], [place] [address]   Iron and TIBC     Status: Abnormal   Collection Time: 12/21/19 11:32 AM  Result Value Ref Range   Iron 9 (L) 45 - 182 ug/dL   TIBC 449 250 - 450 ug/dL   Saturation Ratios 2 (L) 17.9 - 39.5 %   UIBC 440 ug/dL    Comment: Performed at [facility], [place] [address] [place], [place] [address]  Ferritin     Status: Abnormal   Collection Time: 12/21/19 11:32 AM  Result Value Ref Range   Ferritin 23 (L) 24 - 336 ng/mL    Comment: Performed at [facility], [place] [address], [place], [place] [address]  Sodium, urine, random     Status: None   Collection Time: 12/21/19 12:38 PM  Result Value Ref Range   Sodium, Ur <10 mmol/L    Comment: Performed at [facility] [address] [place], [place] [address]  Protein, urine, random     Status: None   Collection Time: 12/21/19 12:38 PM  Result Value Ref Range   Total Protein, Urine 8 mg/dL    Comment: NO NORMAL RANGE ESTABLISHED FOR THIS TEST Performed at [facility], [place] [address], [place], [place] [address]   Creatinine, urine, random     Status: None   Collection Time: 12/21/19 12:38 PM  Result Value Ref Range   Creatinine, Urine 63.82 mg/dL    Comment: Performed at [facility] [address] [place], [place] [address]  CBC with  Differential/Platelet     Status: Abnormal   Collection Time: 12/22/19  4:21 AM  Result Value Ref Range   WBC 20.2 (H) 4.0 - 10.5 K/uL   RBC 3.03 (L) 4.22 - 5.81 MIL/uL   Hemoglobin 7.7 (L) 13.0 - 17.0 g/dL   HCT 25.8 (L) 39.0 - 52.0 %   MCV 85.1 80.0 - 100.0 fL   MCH 25.4 (L) 26.0 - 34.0 pg   MCHC 29.8 (L) 30.0 - 36.0 g/dL   RDW 19.4 (H) 11.5 - 15.5 %   Platelets 285 150 - 400 K/uL   nRBC 1.3 (H) 0.0 - 0.2 %   Neutrophils Relative % 81 %   Neutro Abs 16.3 (H) 1.7 - 7.7 K/uL   Lymphocytes Relative 5 %   Lymphs Abs 0.9 0.7 - 4.0 K/uL   Monocytes Relative 13 %   Monocytes Absolute 2.7 (H) 0.1 - 1.0 K/uL   Eosinophils Relative 0 %   Eosinophils Absolute 0.1 0.0 - 0.5 K/uL   Basophils Relative 0 %   Basophils Absolute 0.0 0.0 - 0.1 K/uL   Immature Granulocytes 1 %   Abs Immature Granulocytes 0.18 (H) 0.00 -  0.07 K/uL    Comment: Performed at [facility], [place] [address] [place], [place] [address]  Basic metabolic panel     Status: Abnormal   Collection Time: 12/22/19  4:21 AM  Result Value Ref Range   Sodium 123 (L) 135 - 145 mmol/L   Potassium 4.1 3.5 - 5.1 mmol/L   Chloride 92 (L) 98 - 111 mmol/L   CO2 17 (L) 22 - 32 mmol/L   Glucose, Bld 126 (H) 70 - 99 mg/dL    Comment: Glucose reference range applies only to samples taken after fasting for at least 8 hours.   BUN 59 (H) 8 - 23 mg/dL   Creatinine, Ser 2.12 (H) 0.61 - 1.24 mg/dL   Calcium 8.0 (L) 8.9 - 10.3 mg/dL   GFR calc non Af Amer 29 (L) >60 mL/min   GFR calc Af Amer 34 (L) >60 mL/min   Anion gap 14 5 - 15    Comment: Performed at [facility] [address] [place], [place] [address]    No results found.  Assessment/Plan  1.  AKI on CKD 3b: in the setting of ABLA and Afib/ flutter.  UOP OK and Cr seems to be stabilized out.  Looks like a hemodynamic issue.  No indication for HD, UA bland, treat supportively.  2.  Hyponatremia: appears like getting vol overloaded with blood administration/ IVFs.   Urine Na < 10.  Will get osms for completeness- will give 60 IV Lasix BID, close attention to pressures, may need to add midodrine in addition but will see how he does when started this evening.  Takes torsemide at home  3.  GIB- off Xarelto, has been declining scope.  Will order feraheme  4.  Afib/flutter- not on AC at present d/t #3, on amiodarone now  5.  Dispo: pending  Madelon Lips 12/22/2019, 1:38 PM

## 2019-12-22 NOTE — Progress Notes (Signed)
OT Cancellation Note  Patient Details Name: Larry [name] MRN: [id number] DOB: June 11, 1941   Cancelled Treatment:    Reason Eval/Treat Not Completed: Patient declined, no reason specified;Other (comment)(Pt declined x2 times for MD visit and pt resting. )  OT to continue to follow  Jefferey Pica, OTR/L [place] Pager: [phone] Office: [phone]   Marcin Holte C 12/22/2019, 3:29 PM

## 2019-12-22 NOTE — Progress Notes (Addendum)
Advanced Heart Failure Team Consult Note   Primary Physician: Patient, No Pcp Per PCP-Cardiologist:  No primary care provider on file.  Reason for Consultation: Heart Failure   HPI:    Larry [name] is seen today for evaluation of heart failure at the request of Dr Maylene Roes.   Larry [name] is a 79 year old with a history of CAD, persistent atrial fibrillation, and chronic diastolic CHF. Patient had initial PCI to RCA in 11/1998. This was complicated by stent thrombosis with inferior STEMI in 08/1999 requiring PCI. In 2017, he was noted to be in atrial fibrillation persistently. He decided not to undergo cardioversion. Echo in 5/19 showed EF 50-55%, severe biatrial enlargement.   He was doing well until around 5/19. At that time, he noted his HR running higher and he developed peripheral edema. He would "give out" much more easily. No chest pain, just felt a severe lack of energy. He would get short of breath walking short distances. He was started on torsemide. He was admitted in 6/19 for diuresis (acute/chronic diastolic CHF).   He was started on Tikosyn and went back into NSR.At a prior appointment with me, he was noted to be in atrial fibrillation with prolonged QT interval.  I discussed with Dr. Rayann Heman and decreased Tikosyn to 125 mcg bid.  By 11/15/18, he was back in NSR, QTc ok.  In early 3/20, he noted low BP and lightheadedness.  I stopped his metoprolol and spironolactone.    In August CBC was checked, hgb was 7.6.  Torsemide was also increased due to concern for volume overload.  He has not had melena or BRBPR.  GI referral was recommended, he has never had a colonoscopy.  However, he declined this.  He thinks that his blood loss came from extensive bruising from a fall that he had had.  He was started on oral iron.  Hemoglobin as trended up, it was 11.3 in 9/20.  Followed closely in the HF clinic and was last seen 12/04/19. At that time he was in NSR with prolonged Qtc was  prolonged so tikosyn was stopped.  He was referred back to a fib clinic. Unfortunately he went back a fib/ He was instructed to got to the ED however declined. On 12/19/19 he was seen by A fib and was hypotensive and having diarrhea with bright red blood. He was sent to the ED.   Admitted 12/19/19 with weakness in the setting of GI bleed. Hgb was 5.8 and stool + guaiac.  Xarelto stopped. Given 10 mg vitamin k and 2UPRBCs. Pertinent admission labs included SARS 2 negative, hgb 5.8, K 5.6, Cr 2.2. EKG on admit showed A flutter. Placed on IV amiodarone.   GI consulted. He declined colonoscopy. Also ordered feraheme however he declined. He wants to follow up with GI in [place].   Todays Hgb 7.7 .  Complaining of weakness. Denies SOB. Denies chest pain.   Review of Systems: [y] = yes, [ ]  = no   . General: Weight gain [ ] ; Weight loss [ ] ; Anorexia [ ] ; Fatigue [Y ]; Fever [ ] ; Chills [ ] ; Weakness [Y ]  . Cardiac: Chest pain/pressure [ ] ; Resting SOB [ ] ; Exertional SOB [ ] ; Orthopnea [ ] ; Pedal Edema [ ] ; Palpitations [ ] ; Syncope [ ] ; Presyncope [ ] ; Paroxysmal nocturnal dyspnea[ ]   . Pulmonary: Cough [ ] ; Wheezing[ ] ; Hemoptysis[ ] ; Sputum [ ] ; Snoring [ ]   . GI: Vomiting[ ] ; Dysphagia[ ] ; Melena[ ] ;  Hematochezia [ ] ; Heartburn[ ] ; Abdominal pain [ ] ; Constipation [ ] ; Diarrhea [ ] ; BRBPR [Y ]  . GU: Hematuria[ ] ; Dysuria [ ] ; Nocturia[ ]   . Vascular: Pain in legs with walking [ ] ; Pain in feet with lying flat [ ] ; Non-healing sores [ ] ; Stroke [ ] ; TIA [ ] ; Slurred speech [ ] ;  . Neuro: Headaches[ ] ; Vertigo[ ] ; Seizures[ ] ; Paresthesias[ ] ;Blurred vision [ ] ; Diplopia [ ] ; Vision changes [ ]   . Ortho/Skin: Arthritis [ ] ; Joint pain [Y ]; Muscle pain [ ] ; Joint swelling [ ] ; Back Pain [ ] ; Rash [ ]   . Psych: Depression[ ] ; Anxiety[ ]   . Heme: Bleeding problems [ ] ; Clotting disorders [ ] ; Anemia [Y ]  . Endocrine: Diabetes [ ] ; Thyroid dysfunction[ ]   Home Medications Prior to Admission  medications   Medication Sig Start Date End Date Taking? Authorizing Provider  acetaminophen (TYLENOL) 500 MG tablet Take 1,000 mg by mouth as needed for moderate pain (back pain).    Yes [provider]  loratadine (CLARITIN) 10 MG tablet Take 10 mg by mouth daily.   Yes [provider]  metoprolol succinate (TOPROL XL) 25 MG 24 hr tablet Take 1 tablet (25 mg total) by mouth daily. 06/25/19 06/24/20 Yes Larey Dresser, MD  nitroGLYCERIN (NITROSTAT) 0.4 MG SL tablet Place 1 tablet (0.4 mg total) under the tongue every 5 (five) minutes as needed for chest pain (3 doses max). 02/19/18  Yes Imogene Burn, PA-C  potassium chloride (K-DUR) 20 MEQ tablet Take 3 tablets (60 mEq total) by mouth every morning AND 2 tablets (40 mEq total) every evening. 06/25/19  Yes Larey Dresser, MD  pravastatin (PRAVACHOL) 40 MG tablet Take 1 tablet (40 mg total) by mouth every evening. 12/04/19  Yes Clegg, Amy D, NP  spironolactone (ALDACTONE) 25 MG tablet Take 0.5 tablets (12.5 mg total) by mouth daily. 12/12/19  Yes Larey Dresser, MD  torsemide (DEMADEX) 20 MG tablet Take 4 tablets (80 mg total) by mouth every morning AND 3 tablets (60 mg total) every evening. 08/08/19  Yes Larey Dresser, MD  XARELTO 20 MG TABS tablet TAKE ONE TABLET (20 MG TOTAL) BY MOUTH DAILY WITH SUPPER 05/05/19  Yes Bensimhon, Shaune Pascal, MD    Past Medical History: Past Medical History:  Diagnosis Date  . Coronary artery disease    status post remote PCI of the mid to distal RCA 11/1998 followed by acute ST elevation MI inferiorly 08/1999 with occlusion of the prior RCA stent as well as 70% diagonal branch.  He underwent PCI of the RCA  . Hypercholesterolemia   . Hypertension   . Myocardial infarct ([place])   . Olecranon bursitis     Past Surgical History: Past Surgical History:  Procedure Laterality Date  . CARDIAC CATHETERIZATION  12/15/1999   EF was 55%   . CORONARY ANGIOPLASTY WITH STENT PLACEMENT  08/15/1999    CAD, status post prior stenting of the mid to distal RCA in 11/1998, with an acute diaphragmatic wall infarction due to total occlusion at the stent site/There is also 70% narrowing in the diagonal branch of the LAD/The LV showed inferior wall hypo- akinesis.-- Successful reperfusion, percutaneous transluminal coronary percutaneous transluminal coronary & placement of a 2nd overlying stent in RCA    Family History: Family History  Problem Relation Age of Onset  . Heart Problems Mother   . Cancer Father     Social History: Social History  Socioeconomic History  . Marital status: Married    Spouse name: scarlett  . Number of children: 3  . Years of education: 63  . Highest education level: Not on file  Occupational History  . Occupation: retired  Tobacco Use  . Smoking status: Former Smoker    Types: Cigarettes    Quit date: 11/02/1999    Years since quitting: 20.1  . Smokeless tobacco: Never Used  Substance and Sexual Activity  . Alcohol use: No  . Drug use: No  . Sexual activity: Not on file  Other Topics Concern  . Not on file  Social History Narrative  . Not on file   Social Determinants of Health   Financial Resource Strain:   . Difficulty of Paying Living Expenses:   Food Insecurity:   . Worried About Charity fundraiser in the Last Year:   . Arboriculturist in the Last Year:   Transportation Needs:   . Film/video editor (Medical):   Marland Kitchen Lack of Transportation (Non-Medical):   Physical Activity:   . Days of Exercise per Week:   . Minutes of Exercise per Session:   Stress:   . Feeling of Stress :   Social Connections:   . Frequency of Communication with Friends and Family:   . Frequency of Social Gatherings with Friends and Family:   . Attends Religious Services:   . Active Member of Clubs or Organizations:   . Attends Archivist Meetings:   Marland Kitchen Marital Status:     Allergies:  Allergies  Allergen Reactions  . Plavix [Clopidogrel Bisulfate]  Anaphylaxis         Objective:    Vital Signs:   Temp:  [97.4 F (36.3 C)-97.6 F (36.4 C)] 97.4 F (36.3 C) (03/15 0458) Pulse Rate:  [73-88] 79 (03/15 0924) Resp:  [18-20] 20 (03/15 0924) BP: (90-100)/(58-69) 100/62 (03/15 0924) SpO2:  [98 %-100 %] 100 % (03/14 1929) Weight:  [82.6 kg] 82.6 kg (03/15 0458) Last BM Date: 12/19/19  Weight change: Filed Weights   12/20/19 0513 12/21/19 0430 12/22/19 0458  Weight: 78.7 kg 79.5 kg 82.6 kg    Intake/Output:   Intake/Output Summary (Last 24 hours) at 12/22/2019 1148 Last data filed at 12/22/2019 0900 Gross per 24 hour  Intake 2017.23 ml  Output 1050 ml  Net 967.23 ml      Physical Exam    General:  Well appearing. No resp difficulty HEENT: normal Neck: supple. JVP 8-9. Carotids 2+ bilat; no bruits. No lymphadenopathy or thyromegaly appreciated. Cor: PMI nondisplaced. Regular rate & rhythm. No rubs, gallops or murmurs. Lungs: clear Abdomen: soft, nontender, nondistended. No hepatosplenomegaly. No bruits or masses. Good bowel sounds. Extremities: no cyanosis, clubbing, rash, R and LLE trace edema Neuro: alert & orientedx3, cranial nerves grossly intact. moves all 4 extremities w/o difficulty. Affect pleasant   Telemetry   NSR 70s   EKG    On admit a flutter 117 bpm   Labs   Basic Metabolic Panel: Recent Labs  Lab 12/19/19 1300 12/19/19 1300 12/20/19 0747 12/21/19 0453 12/22/19 0421  NA 129*  --  128* 125* 123*  K 5.6*  --  4.9 4.1 4.1  CL 94*  --  94* 93* 92*  CO2 19*  --  21* 19* 17*  GLUCOSE 133*  --  142* 156* 126*  BUN 83*  --  80* 61* 59*  CREATININE 2.20*  --  2.31* 2.05* 2.12*  CALCIUM 9.1   < >  8.5* 8.0* 8.0*   < > = values in this interval not displayed.    Liver Function Tests: Recent Labs  Lab 12/19/19 1500  AST 19  ALT 13  ALKPHOS 101  BILITOT 2.1*  PROT 6.1*  ALBUMIN 3.0*   No results for input(s): LIPASE, AMYLASE in the last 168 hours. No results for input(s): AMMONIA in  the last 168 hours.  CBC: Recent Labs  Lab 12/19/19 1300 12/19/19 2031 12/20/19 0747 12/21/19 0453 12/22/19 0421  WBC 14.5* 10.5 15.7* 21.6* 20.2*  NEUTROABS 11.4*  --   --  17.9* 16.3*  HGB 5.8* 6.5* 7.8* 7.5* 7.7*  HCT 20.1* 21.1* 24.6* 24.0* 25.8*  MCV 84.8 83.7 83.7 83.6 85.1  PLT 336 279 290 279 285    Cardiac Enzymes: No results for input(s): CKTOTAL, CKMB, CKMBINDEX, TROPONINI in the last 168 hours.  BNP: BNP (last 3 results) Recent Labs    05/12/19 1200 12/19/19 1404  BNP 469.5* 192.2*    ProBNP (last 3 results) No results for input(s): PROBNP in the last 8760 hours.   CBG: No results for input(s): GLUCAP in the last 168 hours.  Coagulation Studies: Recent Labs    12/19/19 1300 12/20/19 0747 12/21/19 0453  LABPROT 39.4* 25.8* 21.6*  INR 4.1* 2.4* 1.9*     Imaging    No results found.   Medications:     Current Medications: . sodium chloride   Intravenous Once  . amiodarone  400 mg Oral BID  . ferrous sulfate  325 mg Oral Q breakfast  . pantoprazole (PROTONIX) IV  40 mg Intravenous Q12H  . pravastatin  40 mg Oral QPM     Infusions:     Assessment/Plan   1. Symptomatic Anemia  Hgb 5.8.--> 2PRBCs ---> Hgb 7.7  Continue PPI GI consulted. Refuses scope and feraheme  Plans to follow up with his GI doctor after d/c  2. PAF Tikosyn stopped 2/25 due to prolonged QTc - Back in A FIb on 3/9. Started on amio drip and converted to 400 mg twice a day.  - Back NSR today.  - Xarelto held with GI bleed.  - Hgb 7.7 today. No further bleeding noted.   3. Chronic Diastolic HF  ECHO 6440 EF 50-55% - Volume status elevated. IV lasix 60 mg twice a day.   - Renal function a little elevated.    4. CAD  -PCI to RCA, had stent thrombosis back in 2000  5. Hyponatremia  -Sodium 123. Restrict free water.  -May need dose of tolvaptan.   6. CKD Stage IIIb Creatinine baseline 1.6-1.8 Todays creatinine 2.1    Length of Stay: 3  Amy Clegg,  NP  12/22/2019, 11:48 AM  Advanced Heart Failure Team Pager [phone] (M-F; 7a - 4p)  Please contact [facility] for night-coverage after hours (4p -7a ) and weekends on [email or web address]  Patient seen with NP, agree with the above note.   He was admitted with weakness and GI bleeding (+hemoccult).  He has been transfused 2 units PRBCs and Xarelto stopped. Still feels weak.  Creatinine up to 2.12.  SBP 90s-100s.  Na low at 123.   He was in atrial fibrillation at admission, now back in NSR on amiodarone gtt.   Echo was reviewed, EF 60-65% with D-shaped septum.  Mild to moderate RV dilation, normal systolic function.  IVC dilated.   General: NAD Neck: JVP 12 cm, no thyromegaly or thyroid nodule.  Lungs: Clear to auscultation bilaterally with normal respiratory effort.  CV: Nondisplaced PMI.  Heart regular S1/S2, no S3/S4, no murmur.  No peripheral edema.  No carotid bruit.  Normal pedal pulses.  Abdomen: Soft, nontender, no hepatosplenomegaly, no distention.  Skin: Intact without lesions or rashes.  Neurologic: Alert and oriented x 3.  Psych: Normal affect. Extremities: No clubbing or cyanosis.  HEENT: Normal.    1. Acute on chronic diastolic CHF: With significant component of RV failure.  Echo today was reviewed, EF 60-65% with D-shaped septum, mild-moderate RV dilation and normal RV systolic function, IVC dilated.  Creatinine is elevated at 2.1 but he looks volume overloaded.  On high dose torsemide at home.  - Agree with Lasix 60 mg IV bid for now and follow creatinine.  - With atrial fibrillation and prominent diastolic CHF, cardiac amyloidosis is a concern.Myeloma panelwas negative. In the past, I have recommended a PYP scan and we discussed cardiac amyloidosis.  Will see if we can do it this admission.  2. CAD: PCI to RCA, had stent thrombosis back in 2000.No CP. - He has not been on ASA given Xarelto use. Would hold for now with GI bleeding.   - Continue pravastatin, good lipids  in 9/20.  3. Atrial fibrillation: Paroxysmal, NSR today on amiodarone gtt.  He had to stop Tikosyn due to long QTc on lowest dose Tikosyn. He is now off Xarelto with GI bleeding. - Continue amiodarone IV for now.  - In the absence of GI workup with EGD/colonoscopy, it is going to be hard to determine when he can resume anticoagulation (see below).  - When anticoagulation restarted, would use apixaban as this seems to have somewhat less GI bleeding risk.  4. GI bleeding/microcytic anemia: Fe deficiency.  Hgb down to 7.6 in 8/20.  At the time, I recommended GI workup but he refused.  This admission, hgb markedly low and stool is heme+.  He has had 2 units PRBCs.  He again refuses inpatient GI workup, says he is "too weak."  He is willing to see a GI MD in [place] as an outpatient.  - He is willing to take IV Fe, will order.  - Will readdress GI workup prior to discharge, he may be willing to have it done if he starts to feel better.  5. Hyponatremia: Hypervolemic hyponatremia.  Will give dose of tolvaptan 15 mg x 1. Follow closely.  6. AKI on CKD stage 3: In setting of GI bleeding and anemia.  However, now appears volume overloaded.  BUN trending down, creatinine mildly higher.   [name] 12/22/2019 4:16 PM

## 2019-12-23 ENCOUNTER — Inpatient Hospital Stay (HOSPITAL_COMMUNITY): Payer: PPO

## 2019-12-23 LAB — CBC WITH DIFFERENTIAL/PLATELET
Abs Immature Granulocytes: 0.13 10*3/uL — ABNORMAL HIGH (ref 0.00–0.07)
Basophils Absolute: 0 10*3/uL (ref 0.0–0.1)
Basophils Relative: 0 %
Eosinophils Absolute: 0.2 10*3/uL (ref 0.0–0.5)
Eosinophils Relative: 1 %
HCT: 24.2 % — ABNORMAL LOW (ref 39.0–52.0)
Hemoglobin: 7.6 g/dL — ABNORMAL LOW (ref 13.0–17.0)
Immature Granulocytes: 1 %
Lymphocytes Relative: 4 %
Lymphs Abs: 0.7 10*3/uL (ref 0.7–4.0)
MCH: 25.8 pg — ABNORMAL LOW (ref 26.0–34.0)
MCHC: 31.4 g/dL (ref 30.0–36.0)
MCV: 82 fL (ref 80.0–100.0)
Monocytes Absolute: 2.4 10*3/uL — ABNORMAL HIGH (ref 0.1–1.0)
Monocytes Relative: 15 %
Neutro Abs: 13.1 10*3/uL — ABNORMAL HIGH (ref 1.7–7.7)
Neutrophils Relative %: 79 %
Platelets: 319 10*3/uL (ref 150–400)
RBC: 2.95 MIL/uL — ABNORMAL LOW (ref 4.22–5.81)
RDW: 19.4 % — ABNORMAL HIGH (ref 11.5–15.5)
WBC: 16.6 10*3/uL — ABNORMAL HIGH (ref 4.0–10.5)
nRBC: 1.5 % — ABNORMAL HIGH (ref 0.0–0.2)

## 2019-12-23 LAB — BASIC METABOLIC PANEL
Anion gap: 12 (ref 5–15)
BUN: 62 mg/dL — ABNORMAL HIGH (ref 8–23)
CO2: 18 mmol/L — ABNORMAL LOW (ref 22–32)
Calcium: 8.1 mg/dL — ABNORMAL LOW (ref 8.9–10.3)
Chloride: 94 mmol/L — ABNORMAL LOW (ref 98–111)
Creatinine, Ser: 2.18 mg/dL — ABNORMAL HIGH (ref 0.61–1.24)
GFR calc Af Amer: 33 mL/min — ABNORMAL LOW (ref >60)
GFR calc non Af Amer: 28 mL/min — ABNORMAL LOW (ref >60)
Glucose, Bld: 113 mg/dL — ABNORMAL HIGH (ref 70–99)
Potassium: 5 mmol/L (ref 3.5–5.1)
Sodium: 124 mmol/L — ABNORMAL LOW (ref 135–145)

## 2019-12-23 MED ORDER — POLYETHYLENE GLYCOL 3350 17 G PO PACK
17.0000 g | PACK | Freq: Every day | ORAL | Status: DC
  Administered 2019-12-23 – 2019-12-26 (×3): 17 g via ORAL
  Filled 2019-12-23 (×3): qty 1

## 2019-12-23 MED ORDER — IPRATROPIUM-ALBUTEROL 0.5-2.5 (3) MG/3ML IN SOLN: 3.0000 mL | Freq: Four times a day (QID) | RESPIRATORY_TRACT | Status: DC | PRN

## 2019-12-23 NOTE — Progress Notes (Addendum)
Advanced Heart Failure Rounding Note  PCP-Cardiologist: No primary care provider on file.   Subjective:    Admitted with GI bleed. Received 2 UPRBCs. Refused scope  Yesterday received tolvaptan and IV lasix.   Weight down 1 pound.  Denies SOB.  Denies BRBPR   Objective:   Weight Range: 81.2 kg Body mass index is 24.28 kg/m.   Vital Signs:   Temp:  [97.2 F (36.2 C)-98.6 F (37 C)] 98.6 F (37 C) (03/16 0401) Pulse Rate:  [68-79] 71 (03/16 0401) Resp:  [16-20] 18 (03/16 0401) BP: (92-100)/(58-72) 92/65 (03/16 0401) SpO2:  [95 %-100 %] 98 % (03/16 0401) Weight:  [81.2 kg] 81.2 kg (03/16 0401) Last BM Date: 12/19/19  Weight change: Filed Weights   12/21/19 0430 12/22/19 0458 12/23/19 0401  Weight: 79.5 kg 82.6 kg 81.2 kg    Intake/Output:   Intake/Output Summary (Last 24 hours) at 12/23/2019 0743 Last data filed at 12/23/2019 0731 Gross per 24 hour  Intake 891.77 ml  Output 1600 ml  Net -708.23 ml      Physical Exam    General:  Well appearing. No resp difficulty HEENT: Normal Neck: Supple. JVP 8-9 . Carotids 2+ bilat; no bruits. No lymphadenopathy or thyromegaly appreciated. Cor: PMI nondisplaced. Regular rate & rhythm. No rubs, gallops or murmurs. Lungs: Clear Abdomen: Soft, nontender, nondistended. No hepatosplenomegaly. No bruits or masses. Good bowel sounds. Extremities: No cyanosis, clubbing, rash, R and LLE 1+ edema Neuro: Alert & orientedx3, cranial nerves grossly intact. moves all 4 extremities w/o difficulty. Affect pleasant   Telemetry   SR 70-80s   EKG    n/a  Labs    CBC Recent Labs    12/22/19 0421 12/23/19 0308  WBC 20.2* 16.6*  NEUTROABS 16.3* 13.1*  HGB 7.7* 7.6*  HCT 25.8* 24.2*  MCV 85.1 82.0  PLT 285 010   Basic Metabolic Panel Recent Labs    12/22/19 0421 12/23/19 0308  NA 123* 124*  K 4.1 5.0  CL 92* 94*  CO2 17* 18*  GLUCOSE 126* 113*  BUN 59* 62*  CREATININE 2.12* 2.18*  CALCIUM 8.0* 8.1*    Liver Function Tests No results for input(s): AST, ALT, ALKPHOS, BILITOT, PROT, ALBUMIN in the last 72 hours. No results for input(s): LIPASE, AMYLASE in the last 72 hours. Cardiac Enzymes No results for input(s): CKTOTAL, CKMB, CKMBINDEX, TROPONINI in the last 72 hours.  BNP: BNP (last 3 results) Recent Labs    05/12/19 1200 12/19/19 1404  BNP 469.5* 192.2*    ProBNP (last 3 results) No results for input(s): PROBNP in the last 8760 hours.   D-Dimer No results for input(s): DDIMER in the last 72 hours. Hemoglobin A1C No results for input(s): HGBA1C in the last 72 hours. Fasting Lipid Panel No results for input(s): CHOL, HDL, LDLCALC, TRIG, CHOLHDL, LDLDIRECT in the last 72 hours. Thyroid Function Tests No results for input(s): TSH, T4TOTAL, T3FREE, THYROIDAB in the last 72 hours.  Invalid input(s): FREET3  Other results:   Imaging    ECHOCARDIOGRAM COMPLETE  Result Date: 12/22/2019    ECHOCARDIOGRAM REPORT   Patient Name:   JOSHUAJAMES MOEHRING Date of Exam: 12/22/2019 Medical Rec #:  [id number]     Height:       72.0 in Accession #:    [id number]    Weight:       182.0 lb Date of Birth:  03-Jul-1941     BSA:  2.047 m Patient Age:    17 years      BP:           100/62 mmHg Patient Gender: M             HR:           77 bpm. Exam Location:  Inpatient Procedure: 2D Echo, Cardiac Doppler and Color Doppler Indications:    Dyspnea 786.09 / 06.00  History:        Patient has prior history of Echocardiogram examinations, most                 recent 02/22/2018. CAD and Previous Myocardial Infarction; Risk                 Factors:Hypertension and Dyslipidemia.  Sonographer:    Tiffany Dance Referring Phys: [place]  1. Hypokinesis of the basal septum. Left ventricular ejection fraction, by estimation, is 60 to 65%. The left ventricle has normal function. The left ventricle demonstrates regional wall motion abnormalities (see scoring diagram/findings for  description). There is mild concentric left ventricular hypertrophy. Left ventricular diastolic parameters are indeterminate.  2. Right ventricular systolic function is normal. The right ventricular size is normal. There is normal pulmonary artery systolic pressure.  3. Left atrial size was severely dilated.  4. The mitral valve is normal in structure. No evidence of mitral valve regurgitation. No evidence of mitral stenosis.  5. The aortic valve is normal in structure. Aortic valve regurgitation is not visualized. No aortic stenosis is present.  6. The inferior vena cava is dilated in size with >50% respiratory variability, suggesting right atrial pressure of 8 mmHg. FINDINGS  Left Ventricle: Hypokinesis of the basal septum. Left ventricular ejection fraction, by estimation, is 60 to 65%. The left ventricle has normal function. The left ventricle demonstrates regional wall motion abnormalities. The left ventricular internal cavity size was normal in size. There is mild concentric left ventricular hypertrophy. Left ventricular diastolic parameters are indeterminate. The ratio of pulmonic flow to systemic flow (Qp/Qs ratio) is 1.40. Right Ventricle: The right ventricular size is normal. No increase in right ventricular wall thickness. Right ventricular systolic function is normal. There is normal pulmonary artery systolic pressure. The tricuspid regurgitant velocity is 2.20 m/s, and  with an assumed right atrial pressure of 8 mmHg, the estimated right ventricular systolic pressure is 35.4 mmHg. Left Atrium: Left atrial size was severely dilated. Right Atrium: Right atrial size was normal in size. Pericardium: There is no evidence of pericardial effusion. Mitral Valve: The mitral valve is normal in structure. Normal mobility of the mitral valve leaflets. No evidence of mitral valve regurgitation. No evidence of mitral valve stenosis. Tricuspid Valve: The tricuspid valve is normal in structure. Tricuspid valve  regurgitation is mild . No evidence of tricuspid stenosis. Aortic Valve: The aortic valve is normal in structure. Aortic valve regurgitation is not visualized. No aortic stenosis is present. Pulmonic Valve: The pulmonic valve was normal in structure. Pulmonic valve regurgitation is not visualized. No evidence of pulmonic stenosis. Aorta: The aortic root is normal in size and structure. Venous: The inferior vena cava is dilated in size with greater than 50% respiratory variability, suggesting right atrial pressure of 8 mmHg. IAS/Shunts: No atrial level shunt detected by color flow Doppler. The ratio of pulmonic flow to systemic flow (Qp/Qs ratio) is 1.40.  LEFT VENTRICLE PLAX 2D LVIDd:         4.37 cm  Diastology LVIDs:  2.99 cm  LV e' lateral:   11.60 cm/s LV PW:         1.12 cm  LV E/e' lateral: 8.6 LV IVS:        1.04 cm  LV e' medial:    9.14 cm/s LVOT diam:     2.10 cm  LV E/e' medial:  10.9 LV SV:         65 LV SV Index:   32 LVOT Area:     3.46 cm  RIGHT VENTRICLE             IVC RV Basal diam:  2.92 cm     IVC diam: 2.57 cm RV S prime:     13.90 cm/s RVOT diam:      2.50 cm TAPSE (M-mode): 1.7 cm LEFT ATRIUM             Index       RIGHT ATRIUM           Index LA diam:        5.60 cm 2.74 cm/m  RA Area:     25.20 cm LA Vol (A2C):   87.7 ml 42.85 ml/m RA Volume:   77.50 ml  37.86 ml/m LA Vol (A4C):   89.2 ml 43.58 ml/m LA Biplane Vol: 89.3 ml 43.63 ml/m  AORTIC VALVE             PULMONIC VALVE LVOT Vmax:   103.00 cm/s RVOT Peak grad: 3 mmHg LVOT Vmean:  65.800 cm/s LVOT VTI:    0.189 m  AORTA Ao Root diam: 3.70 cm Ao Asc diam:  3.50 cm MITRAL VALVE               TRICUSPID VALVE MV Area (PHT): 4.36 cm    TR Peak grad:   19.4 mmHg MV Decel Time: 174 msec    TR Vmax:        220.00 cm/s MV E velocity: 99.50 cm/s                            SHUNTS                            Systemic VTI:  0.19 m                            Systemic Diam: 2.10 cm                            Pulmonic VTI:  0.179 m                             Pulmonic Diam: 2.50 cm                            Qp/Qs:         1.34 Skeet Latch MD Electronically signed by Skeet Latch MD Signature Date/Time: 12/22/2019/3:35:58 PM    Final       Medications:     Scheduled Medications: . sodium chloride   Intravenous Once  . amiodarone  400 mg Oral BID  . furosemide  60 mg Intravenous BID  . pantoprazole (PROTONIX) IV  40 mg Intravenous Q12H  . pravastatin  40 mg Oral  QPM  . tolvaptan  15 mg Oral Q24H     Infusions:   PRN Medications:  acetaminophen **OR** acetaminophen, ondansetron **OR** ondansetron (ZOFRAN) IV     Assessment/Plan  1. Symptomatic Anemia  Hgb 5.8.--> 2PRBCs ---> Hgb 7.6 today  Continue PPI GI consulted. Refuses scope - agreed to feraheme.   Plans to follow up with his GI doctor after d/c  2. PAF Tikosyn stopped 2/25 due to prolonged QTc - Back in A FIb on 3/9. Started on amio drip and converted to NSR. Continue amio 400 mg twice a day.  - Maintaining NSR.   - Xarelto held with GI bleed. He is considering eliquis.  - Hgb 7.7>7.6  today. No further bleeding noted.   3. Chronic Diastolic HF  ECHO 6962 EF 50-55% - Volume status remains elevated. Continue IV lasix 60 mg twice a day.   - Renal function a little elevated.    4. CAD  -PCI to RCA, had stent thrombosis back in 2000 - No chest pain.  5. Hyponatremia  -Sodium 124.  Restrict free water.  -- Received tolvaptan 3/15. Given another dose of tolvaptan.   6. CKD Stage IIIb Creatinine baseline 1.6-1.8 Todays creatinine 2.1 >2.2   Length of Stay: 4  Amy Clegg, NP  12/23/2019, 7:43 AM  Advanced Heart Failure Team Pager [phone] (M-F; 7a - 4p)  Please contact [facility] for night-coverage after hours (4p -7a ) and weekends on [email or web address]  Patient seen with NP, agree with the above note.   Still refuses endoscopies but got IV Fe yesterday.  Fe 7.6, down from 7.7 yesterday.  No overt bleeding.  He remains in NSR on  amiodarone.  Says breathing is ok.  I/Os only mildly negative but weight down 3 lbs.  Creatinine mildly higher 2.12 => 2.18.   General: NAD Neck: JVP 14 cm, no thyromegaly or thyroid nodule.  Lungs: Clear to auscultation bilaterally with normal respiratory effort. CV: Nondisplaced PMI.  Heart regular S1/S2, no S3/S4, no murmur.  Trace ankle edema.  Abdomen: Soft, nontender, no hepatosplenomegaly, no distention.  Skin: Intact without lesions or rashes.  Neurologic: Alert and oriented x 3.  Psych: Normal affect. Extremities: No clubbing or cyanosis.  HEENT: Normal.   1. Acute on chronic diastolic CHF: With significant component of RV failure.  Echo this admission showed EF 60-65% with D-shaped septum, mild-moderate RV dilation and normal RV systolic function, IVC dilated. On high dose torsemide at home. Weight down with diuresis yesterday.  On exam, he is still volume overloaded.  Creatinine 2.12 => 2.18.   - Continue Lasix 60 mg IV bid and will give a dose of tolvaptan 15 mg again today.  Follow creatinine closely.  - SBP 90s-100s, if drops lower consider midodrine.  - If he does not make progress, would consider RHC.  - With atrial fibrillation and prominent diastolic CHF, cardiac amyloidosis is a concern.Myeloma panelwas negative. In the past, I have recommended a PYP scan and we discussed cardiac amyloidosis. Will see if we can do it this admission.  2. CAD: PCI to RCA, had stent thrombosis back in 2000.No CP. - He has not been on ASA given Xarelto use. Would hold for now with GI bleeding.   - Continue pravastatin,good lipids in 9/20. 3. Atrial fibrillation: Paroxysmal, NSR today on amiodarone.  He had to stop Tikosyn due to long QTc on lowest dose Tikosyn. He is now off Xarelto with GI bleeding. - amiodarone changed to po.   -  In the absence of GI workup with EGD/colonoscopy, it is going to be hard to determine when he can resume anticoagulation (see below).  - When  anticoagulation restarted, would use apixaban as this seems to have somewhat less GI bleeding risk.  4. GI bleeding/microcytic anemia: Fe deficiency. Hgb down to 7.6 in 8/20.  At the time, I recommended GI workup but he refused.  This admission, hgb markedly low and stool is heme+.  He has had 2 units PRBCs.  He again refuses inpatient GI workup, says he is "too weak."  He is willing to see a GI MD in [place] as an outpatient.  - Got IV Fe this admission.  - Will readdress GI workup prior to discharge, he may be willing to have it done if he starts to feel better.  5. Hyponatremia: Hypervolemic hyponatremia.  Na mildly higher at 124 today.   - Will give dose of tolvaptan 15 mg x 1 again today.  - Fluid restrict.  6. AKI on CKD stage 3: In setting of GI bleeding and anemia.  However, now appears volume overloaded.  BUN/creatinine elevated from baseline but fairly stable today.   [name] 12/23/2019 1:50 PM

## 2019-12-23 NOTE — Plan of Care (Signed)
  Problem: Education: Goal: Knowledge of General Education information will improve Description: Including pain rating scale, medication(s)/side effects and non-pharmacologic comfort measures Outcome: Progressing   Problem: Health Behavior/Discharge Planning: Goal: Ability to manage health-related needs will improve Outcome: Progressing   Problem: Clinical Measurements: Goal: Ability to maintain clinical measurements within normal limits will improve Outcome: Progressing   Problem: Clinical Measurements: Goal: Respiratory complications will improve Outcome: Progressing   Problem: Safety: Goal: Ability to remain free from injury will improve Outcome: Progressing

## 2019-12-23 NOTE — Progress Notes (Signed)
Occupational Therapy Evaluation Patient Details Name: Larry [name] MRN: [id number] DOB: 1941/01/09 Today's Date: 12/23/2019    History of Present Illness Pt adm with weakness and found to have upper GI bleed, AKI, and afib with RVR. PMH - cad, ckd, htn, chf, a flutter   Clinical Impression   PTA pt resided with his wife, independent in all ADL and IADL tasks. Pt ambulates independently without an assistive device around his home, utilizing a cane for community mobility. Pt still drives. Pt currently independent to min guard for self-care and functional transfer tasks. Pt tolerated sitting EOB ~10 min while engaging in self-care tasks. Educated pt on safety strategies, fall prevention, and energy conservation techniques with fair understanding. Pt able to transfer to bedside chair with RW, min assist, and cues for safety and pacing. Noted 0 instances of LOB, however pt unsteady on feet. Pt reported max fatigue following transfer. Pt demonstrates decreased strength, endurance, balance, standing tolerance, activity tolerance, and safety awareness impacting ability to complete self-care and functional transfer tasks. Recommend skilled OT services to address above deficits in order to promote function and prevent further decline. Recommend [place] OT for continued rehab following hospital discharge.     Follow Up Recommendations  Home health OT;Supervision/Assistance - 24 hour    Equipment Recommendations  None recommended by OT    Recommendations for Other Services       Precautions / Restrictions Precautions Precautions: Fall Restrictions Weight Bearing Restrictions: No      Mobility Bed Mobility Overal bed mobility: Modified Independent Bed Mobility: Supine to Sit     Supine to sit: HOB elevated     General bed mobility comments: Use of bed rail  Transfers Overall transfer level: Needs assistance Equipment used: Rolling walker (2 wheeled) Transfers: Sit to/from [employer] Sit to Stand: Min guard Stand pivot transfers: Min guard       General transfer comment: Cues for safety and pacing to increase balance and prevent future falls.     Balance Overall balance assessment: Needs assistance Sitting-balance support: No upper extremity supported;Feet supported Sitting balance-Leahy Scale: Good       Standing balance-Leahy Scale: Poor                             ADL either performed or assessed with clinical judgement   ADL Overall ADL's : Needs assistance/impaired Eating/Feeding: Independent;Sitting   Grooming: Set up;Supervision/safety;Sitting   Upper Body Bathing: Set up;Supervision/ safety;Sitting   Lower Body Bathing: Supervison/ safety;Min guard;Sit to/from stand   Upper Body Dressing : Set up;Supervision/safety;Sitting   Lower Body Dressing: Supervision/safety;Min guard;Sit to/from stand   Toilet Transfer: Min Psychologist, counselling- Water quality scientist and Hygiene: Min guard;Supervision/safety;Sit to/from stand       Functional mobility during ADLs: Min guard;Rolling walker General ADL Comments: Pt tolerated sitting EOB 10 min with supervision. Pt able to transfer to bedside chair with RW and min guard. Pt too fatigued to engage in additional mobility and transfer tasks this date.      Vision Baseline Vision/History: Wears glasses Wears Glasses: At all times       Perception     Praxis      Pertinent Vitals/Pain Pain Assessment: No/denies pain     Hand Dominance Left   Extremity/Trunk Assessment Upper Extremity Assessment Upper Extremity Assessment: Overall WFL for tasks assessed   Lower Extremity Assessment Lower Extremity Assessment: Defer to PT evaluation  Communication Communication Communication: No difficulties   Cognition Arousal/Alertness: Awake/alert Behavior During Therapy: WFL for tasks assessed/performed Overall Cognitive Status: Within Functional Limits for tasks  assessed                                 General Comments: Pt pleasant and willing to participate in therapy. Cues for safety and to slow pace with transfers and mobility.    General Comments  HR increased to 108 with activity.     Exercises     Shoulder Instructions      Home Living Family/patient expects to be discharged to:: Private residence Living Arrangements: Spouse/significant other Available Help at Discharge: Family;Available 24 hours/day Type of Home: House Home Access: Stairs to enter [employer] of Steps: 3 Entrance Stairs-Rails: Right Home Layout: Two level;Able to live on main level with bedroom/bathroom     Bathroom Shower/Tub: Teacher, early years/pre: Standard     Home Equipment: Cane - single point;Walker - 2 wheels;Wheelchair - manual;Shower seat;Grab bars - tub/shower;Hand held shower head          Prior Functioning/Environment Level of Independence: Independent with assistive device(s)        Comments: Pt independent in all ADLs and IADLs. Pt still drives. Pt reports ambulating independently around home without an assistive device. Pt uses a cane for community mobility. Pt reports 0 falls in the last 6 months.         OT Problem List: Decreased strength;Decreased activity tolerance;Impaired balance (sitting and/or standing);Decreased safety awareness;Cardiopulmonary status limiting activity      OT Treatment/Interventions: Self-care/ADL training;Therapeutic exercise;Neuromuscular education;Energy conservation;DME and/or AE instruction;Therapeutic activities;Patient/family education;Balance training    OT Goals(Current goals can be found in the care plan section) Acute Rehab OT Goals Patient Stated Goal: to go home Time For Goal Achievement: 01/06/20 Potential to Achieve Goals: Good ADL Goals Pt Will Perform Grooming: with modified independence;standing Pt Will Perform Lower Body Bathing: with modified  independence;sit to/from stand Pt Will Perform Lower Body Dressing: with modified independence;sit to/from stand Pt Will Transfer to Toilet: with modified independence;ambulating;regular height toilet Pt Will Perform Toileting - Clothing Manipulation and hygiene: with modified independence;sit to/from stand Pt Will Perform Tub/Shower Transfer: with min guard assist;grab bars Additional ADL Goal #1: Pt to tolerate standing up to 5 min with modified independence, in preparation for ADLs. Additional ADL Goal #2: Pt to recall and verbalize 3 fall prevention strategies with 0 verbal cues.  OT Frequency: Min 3X/week   Barriers to D/C:            Co-evaluation              AM-PAC OT "6 Clicks" Daily Activity     Outcome Measure Help from another person eating meals?: None Help from another person taking care of personal grooming?: A Little Help from another person toileting, which includes using toliet, bedpan, or urinal?: A Little Help from another person bathing (including washing, rinsing, drying)?: A Little Help from another person to put on and taking off regular upper body clothing?: A Little Help from another person to put on and taking off regular lower body clothing?: A Little 6 Click Score: 19   End of Session Equipment Utilized During Treatment: Rolling walker Nurse Communication: Mobility status  Activity Tolerance: Patient limited by fatigue;Patient limited by lethargy Patient left: in chair;with call bell/phone within reach  OT Visit Diagnosis: Unsteadiness on feet (R26.81);Muscle weakness (  generalized) (M62.81)                Time: 1660-6301 OT Time Calculation (min): 24 min Charges:  OT General Charges $OT Visit: 1 Visit OT Evaluation $OT Eval Low Complexity: 1 Low OT Treatments $Self Care/Home Management : 8-22 mins  Mauri Brooklyn OTR/L [phone]  Mauri Brooklyn 12/23/2019, 9:16 AM

## 2019-12-23 NOTE — Progress Notes (Signed)
CPAP in use. 

## 2019-12-23 NOTE — Progress Notes (Signed)
PROGRESS NOTE    Larry [name]  CHE:[id number] DOB: 03-30-1941 DOA: 12/19/2019 PCP: Patient, No Pcp Per   Brief Narrative:  Patient is a 79-year male with history of coronary artery disease, CKD stage IIIb, Hypertension, chronic diastolic congestive heart failure, atrial flutter, hyperlipidemia who presents with weakness.  Or the last couple of days, he was too weak to get out of the bed.  He also had diarrhea with dark stools.  He follows with cardiology for his atrial flutter.  On presentation, his stool was found to be Hemoccult positive, had AKI, hemoglobin of 5.8, A. fib with RVR.  Cardiology and GI consulted.  Patient denied EGD/colonoscopy.  Hospital course significant for hyponatremia.  Cardiology, nephrology following  Assessment & Plan:   Principal Problem:   Acute upper GI bleeding Active Problems:   HYPERCHOLESTEROLEMIA   Hypertension   Acute diastolic CHF (congestive heart failure) (HCC)   Atrial fibrillation with RVR (HCC)   Stage 3b chronic kidney disease    Upper GI bleed: FOBT positive.  Hemoglobin 5.8 on presentation.  GI consulted.  He was transfused with 2 units of PRBCs.  Started on Protonix IV.  Xarelto on hold.  INR was supratherapeutic so he was given vitamin K. Hemoglobin this morning is in the range of 7.  He firmly denies undergoing endoscopy and colonoscopy during this hospitalization.  His wife/daughter  also does not want to proceed with this.  They want to follow as an outpatient for these procedures after he becomes more 'strong'. Iron studies showed severe iron deficiency,he was transfused with a dose of IV iron.  A. fib with RVR: Follows with cardiology as an outpatient.  He was previously on Tikosyn but was stopped due to prolonged QTC.  Cardiology following.  Started on amiodarone drip,which has been changed to oral.  He was also on metoprolol which is on hold due to soft BP.  Xarelto held  due to GI bleed.  Rate  is much controlled now and this morning  he was in normal sinus rhythm.  AKI on CKD stage IIIb: Creatinine up from baseline.  His baseline creatinine is 1.5.Patient denied  renal ultrasound.  Nephrology following.  Found to be volume overloaded on 12/22/2019 and was started on Lasix  Hyponatremia: Sodium of 124 today.  Nephrology following.  Though urine sodium is less than 10, hypervolemic hyponatremia was suspected and started on diuresis.  Continue water restriction.  Being given tolvaptan as per cardiology.  Acute on chronic combined CHF: Echo on 5/19 showed  ejection fraction 50-55%.    Hypertension: Currently blood pressure soft.  Toprol on hold.  Hyperlipidemia: On Pravachol  Leukocytosis: Most likely reactive.  No indication for antibiotic therapy for now.  Continue to monitor.  generalized weakness: PT recommended home health     DVT prophylaxis:SCD Code Status: Full Family Communication: daughter at the bedside on 12/22/19 Disposition Plan: Patient is from home.  Not a stable for discharge because of severe hyponatremia, AKI.  Discharge planning to home with home health after clearance from cardiology and nephrology  Consultants: GI, cardiology  Procedures: None  Antimicrobials:  Anti-infectives (From admission, onward)   None      Subjective:  Patient seen and examined the bedside this morning.  Hemodynamically stable.  Denies any new complaints today.  Continues to deny EGD/colonoscopy.  Objective: Vitals:   12/22/19 2014 12/22/19 2130 12/23/19 0006 12/23/19 0401  BP: (!) 97/58 (!) 93/59  92/65  Pulse: 68  71 71  Resp: 18  16 18  Temp: (!) 97.5 F (36.4 C)   98.6 F (37 C)  TempSrc: Oral     SpO2: 100%  97% 98%  Weight:    81.2 kg  Height:        Intake/Output Summary (Last 24 hours) at 12/23/2019 0747 Last data filed at 12/23/2019 0731 Gross per 24 hour  Intake 891.77 ml  Output 1600 ml  Net -708.23 ml   Filed Weights   12/21/19 0430 12/22/19 0458 12/23/19 0401  Weight: 79.5 kg 82.6 kg  81.2 kg    Examination:  General exam: Generalized weakness Respiratory system: Few crackles on the lung bases, some wheezes Cardiovascular system: S1 & S2 heard, RRR. No JVD, murmurs, rubs, gallops or clicks. Gastrointestinal system: Abdomen is nondistended, soft and nontender. No organomegaly or masses felt. Normal bowel sounds heard. Central nervous system: Alert and oriented. No focal neurological deficits. Extremities: No edema, no clubbing ,no cyanosis Skin: No rashes, lesions or ulcers,no icterus ,no pallor   Data Reviewed: I have personally reviewed following labs and imaging studies  CBC: Recent Labs  Lab 12/19/19 1300 12/19/19 1300 12/19/19 2031 12/20/19 0747 12/21/19 0453 12/22/19 0421 12/23/19 0308  WBC 14.5*   < > 10.5 15.7* 21.6* 20.2* 16.6*  NEUTROABS 11.4*  --   --   --  17.9* 16.3* 13.1*  HGB 5.8*   < > 6.5* 7.8* 7.5* 7.7* 7.6*  HCT 20.1*   < > 21.1* 24.6* 24.0* 25.8* 24.2*  MCV 84.8   < > 83.7 83.7 83.6 85.1 82.0  PLT 336   < > 279 290 279 285 319   < > = values in this interval not displayed.   Basic Metabolic Panel: Recent Labs  Lab 12/19/19 1300 12/20/19 0747 12/21/19 0453 12/22/19 0421 12/23/19 0308  NA 129* 128* 125* 123* 124*  K 5.6* 4.9 4.1 4.1 5.0  CL 94* 94* 93* 92* 94*  CO2 19* 21* 19* 17* 18*  GLUCOSE 133* 142* 156* 126* 113*  BUN 83* 80* 61* 59* 62*  CREATININE 2.20* 2.31* 2.05* 2.12* 2.18*  CALCIUM 9.1 8.5* 8.0* 8.0* 8.1*   GFR: Estimated Creatinine Clearance: 31.6 mL/min (A) (by C-G formula based on SCr of 2.18 mg/dL (H)). Liver Function Tests: Recent Labs  Lab 12/19/19 1500  AST 19  ALT 13  ALKPHOS 101  BILITOT 2.1*  PROT 6.1*  ALBUMIN 3.0*   No results for input(s): LIPASE, AMYLASE in the last 168 hours. No results for input(s): AMMONIA in the last 168 hours. Coagulation Profile: Recent Labs  Lab 12/19/19 1300 12/20/19 0747 12/21/19 0453  INR 4.1* 2.4* 1.9*   Cardiac Enzymes: No results for input(s): CKTOTAL,  CKMB, CKMBINDEX, TROPONINI in the last 168 hours. BNP (last 3 results) No results for input(s): PROBNP in the last 8760 hours. HbA1C: No results for input(s): HGBA1C in the last 72 hours. CBG: No results for input(s): GLUCAP in the last 168 hours. Lipid Profile: No results for input(s): CHOL, HDL, LDLCALC, TRIG, CHOLHDL, LDLDIRECT in the last 72 hours. Thyroid Function Tests: No results for input(s): TSH, T4TOTAL, FREET4, T3FREE, THYROIDAB in the last 72 hours. Anemia Panel: Recent Labs    12/21/19 1132  FERRITIN 23*  TIBC 449  IRON 9*   Sepsis Labs: No results for input(s): PROCALCITON, LATICACIDVEN in the last 168 hours.  Recent Results (from the past 240 hour(s))  SARS CORONAVIRUS 2 (TAT 6-24 HRS) Nasopharyngeal Nasopharyngeal Swab     Status: None   Collection Time: 12/19/19  1:53 PM   Specimen: Nasopharyngeal Swab  Result Value Ref Range Status   SARS Coronavirus 2 NEGATIVE NEGATIVE Final    Comment: (NOTE) SARS-CoV-2 target nucleic acids are NOT DETECTED. The SARS-CoV-2 RNA is generally detectable in upper and lower respiratory specimens during the acute phase of infection. Negative results do not preclude SARS-CoV-2 infection, do not rule out co-infections with other pathogens, and should not be used as the sole basis for treatment or other patient management decisions. Negative results must be combined with clinical observations, patient history, and epidemiological information. The expected result is Negative. Fact Sheet for Patients: SugarRoll.be Fact Sheet for Healthcare Providers: https://www.woods-mathews.com/ This test is not yet approved or cleared by the [place] FDA and  has been authorized for detection and/or diagnosis of SARS-CoV-2 by FDA under an Emergency Use Authorization (EUA). This EUA will remain  in effect (meaning this test can be used) for the duration of the COVID-19 declaration under Section 56  4(b)(1) of the Act, 21 U.S.C. section 360bbb-3(b)(1), unless the authorization is terminated or revoked sooner. Performed at [facility], [place] [address] [place], [place] [address]          Radiology Studies: ECHOCARDIOGRAM COMPLETE  Result Date: 12/22/2019    ECHOCARDIOGRAM REPORT   Patient Name:   ROMELLO HOEHN Date of Exam: 12/22/2019 Medical Rec #:  [id number]     Height:       72.0 in Accession #:    [id number]    Weight:       182.0 lb Date of Birth:  Dec 03, 1940     BSA:          2.047 m Patient Age:    81 years      BP:           100/62 mmHg Patient Gender: M             HR:           77 bpm. Exam Location:  Inpatient Procedure: 2D Echo, Cardiac Doppler and Color Doppler Indications:    Dyspnea 786.09 / 06.00  History:        Patient has prior history of Echocardiogram examinations, most                 recent 02/22/2018. CAD and Previous Myocardial Infarction; Risk                 Factors:Hypertension and Dyslipidemia.  Sonographer:    Tiffany Dance Referring Phys: [place]  1. Hypokinesis of the basal septum. Left ventricular ejection fraction, by estimation, is 60 to 65%. The left ventricle has normal function. The left ventricle demonstrates regional wall motion abnormalities (see scoring diagram/findings for description). There is mild concentric left ventricular hypertrophy. Left ventricular diastolic parameters are indeterminate.  2. Right ventricular systolic function is normal. The right ventricular size is normal. There is normal pulmonary artery systolic pressure.  3. Left atrial size was severely dilated.  4. The mitral valve is normal in structure. No evidence of mitral valve regurgitation. No evidence of mitral stenosis.  5. The aortic valve is normal in structure. Aortic valve regurgitation is not visualized. No aortic stenosis is present.  6. The inferior vena cava is dilated in size with >50% respiratory variability, suggesting right atrial  pressure of 8 mmHg. FINDINGS  Left Ventricle: Hypokinesis of the basal septum. Left ventricular ejection fraction, by estimation, is 60 to 65%. The left ventricle has normal function. The  left ventricle demonstrates regional wall motion abnormalities. The left ventricular internal cavity size was normal in size. There is mild concentric left ventricular hypertrophy. Left ventricular diastolic parameters are indeterminate. The ratio of pulmonic flow to systemic flow (Qp/Qs ratio) is 1.40. Right Ventricle: The right ventricular size is normal. No increase in right ventricular wall thickness. Right ventricular systolic function is normal. There is normal pulmonary artery systolic pressure. The tricuspid regurgitant velocity is 2.20 m/s, and  with an assumed right atrial pressure of 8 mmHg, the estimated right ventricular systolic pressure is 81.0 mmHg. Left Atrium: Left atrial size was severely dilated. Right Atrium: Right atrial size was normal in size. Pericardium: There is no evidence of pericardial effusion. Mitral Valve: The mitral valve is normal in structure. Normal mobility of the mitral valve leaflets. No evidence of mitral valve regurgitation. No evidence of mitral valve stenosis. Tricuspid Valve: The tricuspid valve is normal in structure. Tricuspid valve regurgitation is mild . No evidence of tricuspid stenosis. Aortic Valve: The aortic valve is normal in structure. Aortic valve regurgitation is not visualized. No aortic stenosis is present. Pulmonic Valve: The pulmonic valve was normal in structure. Pulmonic valve regurgitation is not visualized. No evidence of pulmonic stenosis. Aorta: The aortic root is normal in size and structure. Venous: The inferior vena cava is dilated in size with greater than 50% respiratory variability, suggesting right atrial pressure of 8 mmHg. IAS/Shunts: No atrial level shunt detected by color flow Doppler. The ratio of pulmonic flow to systemic flow (Qp/Qs ratio) is 1.40.   LEFT VENTRICLE PLAX 2D LVIDd:         4.37 cm  Diastology LVIDs:         2.99 cm  LV e' lateral:   11.60 cm/s LV PW:         1.12 cm  LV E/e' lateral: 8.6 LV IVS:        1.04 cm  LV e' medial:    9.14 cm/s LVOT diam:     2.10 cm  LV E/e' medial:  10.9 LV SV:         65 LV SV Index:   32 LVOT Area:     3.46 cm  RIGHT VENTRICLE             IVC RV Basal diam:  2.92 cm     IVC diam: 2.57 cm RV S prime:     13.90 cm/s RVOT diam:      2.50 cm TAPSE (M-mode): 1.7 cm LEFT ATRIUM             Index       RIGHT ATRIUM           Index LA diam:        5.60 cm 2.74 cm/m  RA Area:     25.20 cm LA Vol (A2C):   87.7 ml 42.85 ml/m RA Volume:   77.50 ml  37.86 ml/m LA Vol (A4C):   89.2 ml 43.58 ml/m LA Biplane Vol: 89.3 ml 43.63 ml/m  AORTIC VALVE             PULMONIC VALVE LVOT Vmax:   103.00 cm/s RVOT Peak grad: 3 mmHg LVOT Vmean:  65.800 cm/s LVOT VTI:    0.189 m  AORTA Ao Root diam: 3.70 cm Ao Asc diam:  3.50 cm MITRAL VALVE               TRICUSPID VALVE MV Area (PHT): 4.36 cm    TR  Peak grad:   19.4 mmHg MV Decel Time: 174 msec    TR Vmax:        220.00 cm/s MV E velocity: 99.50 cm/s                            SHUNTS                            Systemic VTI:  0.19 m                            Systemic Diam: 2.10 cm                            Pulmonic VTI:  0.179 m                            Pulmonic Diam: 2.50 cm                            Qp/Qs:         1.34 Skeet Latch MD Electronically signed by Skeet Latch MD Signature Date/Time: 12/22/2019/3:35:58 PM    Final         Scheduled Meds: . sodium chloride   Intravenous Once  . amiodarone  400 mg Oral BID  . furosemide  60 mg Intravenous BID  . pantoprazole (PROTONIX) IV  40 mg Intravenous Q12H  . pravastatin  40 mg Oral QPM  . tolvaptan  15 mg Oral Q24H   Continuous Infusions:    LOS: 4 days    Time spent: 35 mins.More than 50% of that time was spent in counseling and/or coordination of care.      Shelly Coss, MD [facility] P3/16/2021, 7:47 AM

## 2019-12-23 NOTE — Progress Notes (Signed)
[facility] Progress Note    Assessment/ Plan:   1.  AKI on CKD 3b: in the setting of ABLA and Afib/ flutter.  UOP OK and Cr seems to be stabilized out.  Looks like a hemodynamic issue.  No indication for HD, UA bland, treat supportively. Stable overall  2.  Hyponatremia: appears like getting vol overloaded with blood administration/ IVFs.  Urine Na < 10.  Will get osms for completeness- will give 60 IV Lasix BID, close attention to pressures, may need to add midodrine in addition but will see how he does when started this evening.  Takes torsemide at home.  Tolvaptan also added per AHF yesterday.  Will get another dose today.  3.  GIB- off Xarelto, has been declining scope.  s/p feraheme x 1  4.  Afib/flutter- not on AC at present d/t #3, on amiodarone now  5.  Dispo: pending  Subjective:    Started on IV Lasix yesterday, also received one dose of [place].  Did well, diuresed well but weight only down 1 lb?  Has multiple drink cups/ ice cups at bedside.  Discussed fluid restriction.   Objective:   BP 100/73   Pulse 69   Temp 98.6 F (37 C)   Resp 18   Ht 6' (1.829 m)   Wt 81.2 kg   SpO2 98%   BMI 24.28 kg/m   Intake/Output Summary (Last 24 hours) at 12/23/2019 1022 Last data filed at 12/23/2019 0731 Gross per 24 hour  Intake 891.77 ml  Output 1400 ml  Net -508.23 ml   Weight change: -1.355 kg  Physical Exam: GEN NAD, sitting in chair HEENT EOMI PERRL NECK + JVD PULM normal WOB, bibasilar crackles improved, still some on L CV tachy ABD soft EXT 2+ LE edema NEURO AAO x 3 nonfocal SKIN no rashes  Imaging: ECHOCARDIOGRAM COMPLETE  Result Date: 12/22/2019    ECHOCARDIOGRAM REPORT   Patient Name:   Larry [name] Date of Exam: 12/22/2019 Medical Rec #:  [id number]     Height:       72.0 in Accession #:    [id number]    Weight:       182.0 lb Date of Birth:  04-19-1941     BSA:          2.047 m Patient Age:    79 years      BP:           100/62 mmHg  Patient Gender: M             HR:           77 bpm. Exam Location:  Inpatient Procedure: 2D Echo, Cardiac Doppler and Color Doppler Indications:    Dyspnea 786.09 / 06.00  History:        Patient has prior history of Echocardiogram examinations, most                 recent 02/22/2018. CAD and Previous Myocardial Infarction; Risk                 Factors:Hypertension and Dyslipidemia.  Sonographer:    Tiffany Dance Referring Phys: [place]  1. Hypokinesis of the basal septum. Left ventricular ejection fraction, by estimation, is 60 to 65%. The left ventricle has normal function. The left ventricle demonstrates regional wall motion abnormalities (see scoring diagram/findings for description). There is mild concentric left ventricular hypertrophy. Left ventricular diastolic parameters are indeterminate.  2. Right ventricular systolic  function is normal. The right ventricular size is normal. There is normal pulmonary artery systolic pressure.  3. Left atrial size was severely dilated.  4. The mitral valve is normal in structure. No evidence of mitral valve regurgitation. No evidence of mitral stenosis.  5. The aortic valve is normal in structure. Aortic valve regurgitation is not visualized. No aortic stenosis is present.  6. The inferior vena cava is dilated in size with >50% respiratory variability, suggesting right atrial pressure of 8 mmHg. FINDINGS  Left Ventricle: Hypokinesis of the basal septum. Left ventricular ejection fraction, by estimation, is 60 to 65%. The left ventricle has normal function. The left ventricle demonstrates regional wall motion abnormalities. The left ventricular internal cavity size was normal in size. There is mild concentric left ventricular hypertrophy. Left ventricular diastolic parameters are indeterminate. The ratio of pulmonic flow to systemic flow (Qp/Qs ratio) is 1.40. Right Ventricle: The right ventricular size is normal. No increase in right ventricular  wall thickness. Right ventricular systolic function is normal. There is normal pulmonary artery systolic pressure. The tricuspid regurgitant velocity is 2.20 m/s, and  with an assumed right atrial pressure of 8 mmHg, the estimated right ventricular systolic pressure is 97.3 mmHg. Left Atrium: Left atrial size was severely dilated. Right Atrium: Right atrial size was normal in size. Pericardium: There is no evidence of pericardial effusion. Mitral Valve: The mitral valve is normal in structure. Normal mobility of the mitral valve leaflets. No evidence of mitral valve regurgitation. No evidence of mitral valve stenosis. Tricuspid Valve: The tricuspid valve is normal in structure. Tricuspid valve regurgitation is mild . No evidence of tricuspid stenosis. Aortic Valve: The aortic valve is normal in structure. Aortic valve regurgitation is not visualized. No aortic stenosis is present. Pulmonic Valve: The pulmonic valve was normal in structure. Pulmonic valve regurgitation is not visualized. No evidence of pulmonic stenosis. Aorta: The aortic root is normal in size and structure. Venous: The inferior vena cava is dilated in size with greater than 50% respiratory variability, suggesting right atrial pressure of 8 mmHg. IAS/Shunts: No atrial level shunt detected by color flow Doppler. The ratio of pulmonic flow to systemic flow (Qp/Qs ratio) is 1.40.  LEFT VENTRICLE PLAX 2D LVIDd:         4.37 cm  Diastology LVIDs:         2.99 cm  LV e' lateral:   11.60 cm/s LV PW:         1.12 cm  LV E/e' lateral: 8.6 LV IVS:        1.04 cm  LV e' medial:    9.14 cm/s LVOT diam:     2.10 cm  LV E/e' medial:  10.9 LV SV:         65 LV SV Index:   32 LVOT Area:     3.46 cm  RIGHT VENTRICLE             IVC RV Basal diam:  2.92 cm     IVC diam: 2.57 cm RV S prime:     13.90 cm/s RVOT diam:      2.50 cm TAPSE (M-mode): 1.7 cm LEFT ATRIUM             Index       RIGHT ATRIUM           Index LA diam:        5.60 cm 2.74 cm/m  RA Area:      25.20 cm LA Vol (  A2C):   87.7 ml 42.85 ml/m RA Volume:   77.50 ml  37.86 ml/m LA Vol (A4C):   89.2 ml 43.58 ml/m LA Biplane Vol: 89.3 ml 43.63 ml/m  AORTIC VALVE             PULMONIC VALVE LVOT Vmax:   103.00 cm/s RVOT Peak grad: 3 mmHg LVOT Vmean:  65.800 cm/s LVOT VTI:    0.189 m  AORTA Ao Root diam: 3.70 cm Ao Asc diam:  3.50 cm MITRAL VALVE               TRICUSPID VALVE MV Area (PHT): 4.36 cm    TR Peak grad:   19.4 mmHg MV Decel Time: 174 msec    TR Vmax:        220.00 cm/s MV E velocity: 99.50 cm/s                            SHUNTS                            Systemic VTI:  0.19 m                            Systemic Diam: 2.10 cm                            Pulmonic VTI:  0.179 m                            Pulmonic Diam: 2.50 cm                            Qp/Qs:         1.34 Skeet Latch MD Electronically signed by Skeet Latch MD Signature Date/Time: 12/22/2019/3:35:58 PM    Final     Labs: BMET Recent Labs  Lab 12/19/19 1300 12/20/19 0747 12/21/19 0453 12/22/19 0421 12/23/19 0308  NA 129* 128* 125* 123* 124*  K 5.6* 4.9 4.1 4.1 5.0  CL 94* 94* 93* 92* 94*  CO2 19* 21* 19* 17* 18*  GLUCOSE 133* 142* 156* 126* 113*  BUN 83* 80* 61* 59* 62*  CREATININE 2.20* 2.31* 2.05* 2.12* 2.18*  CALCIUM 9.1 8.5* 8.0* 8.0* 8.1*   CBC Recent Labs  Lab 12/19/19 1300 12/19/19 2031 12/20/19 0747 12/21/19 0453 12/22/19 0421 12/23/19 0308  WBC 14.5*   < > 15.7* 21.6* 20.2* 16.6*  NEUTROABS 11.4*  --   --  17.9* 16.3* 13.1*  HGB 5.8*   < > 7.8* 7.5* 7.7* 7.6*  HCT 20.1*   < > 24.6* 24.0* 25.8* 24.2*  MCV 84.8   < > 83.7 83.6 85.1 82.0  PLT 336   < > 290 279 285 319   < > = values in this interval not displayed.    Medications:    . sodium chloride   Intravenous Once  . amiodarone  400 mg Oral BID  . furosemide  60 mg Intravenous BID  . pantoprazole (PROTONIX) IV  40 mg Intravenous Q12H  . polyethylene glycol  17 g Oral Daily  . pravastatin  40 mg Oral QPM  . tolvaptan  15 mg  Oral Q24H      Madelon Lips, MD 12/23/2019, 10:22 AM

## 2019-12-24 DIAGNOSIS — D62 Acute posthemorrhagic anemia: Secondary | ICD-10-CM

## 2019-12-24 DIAGNOSIS — E871 Hypo-osmolality and hyponatremia: Secondary | ICD-10-CM

## 2019-12-24 DIAGNOSIS — N1832 Chronic kidney disease, stage 3b: Secondary | ICD-10-CM

## 2019-12-24 LAB — CBC WITH DIFFERENTIAL/PLATELET
Abs Immature Granulocytes: 0.13 10*3/uL — ABNORMAL HIGH (ref 0.00–0.07)
Basophils Absolute: 0 10*3/uL (ref 0.0–0.1)
Basophils Relative: 0 %
Eosinophils Absolute: 0.2 10*3/uL (ref 0.0–0.5)
Eosinophils Relative: 1 %
HCT: 25.6 % — ABNORMAL LOW (ref 39.0–52.0)
Hemoglobin: 7.8 g/dL — ABNORMAL LOW (ref 13.0–17.0)
Immature Granulocytes: 1 %
Lymphocytes Relative: 6 %
Lymphs Abs: 0.9 10*3/uL (ref 0.7–4.0)
MCH: 25.4 pg — ABNORMAL LOW (ref 26.0–34.0)
MCHC: 30.5 g/dL (ref 30.0–36.0)
MCV: 83.4 fL (ref 80.0–100.0)
Monocytes Absolute: 1.8 10*3/uL — ABNORMAL HIGH (ref 0.1–1.0)
Monocytes Relative: 13 %
Neutro Abs: 10.8 10*3/uL — ABNORMAL HIGH (ref 1.7–7.7)
Neutrophils Relative %: 79 %
Platelets: 364 10*3/uL (ref 150–400)
RBC: 3.07 MIL/uL — ABNORMAL LOW (ref 4.22–5.81)
RDW: 19.7 % — ABNORMAL HIGH (ref 11.5–15.5)
WBC: 13.8 10*3/uL — ABNORMAL HIGH (ref 4.0–10.5)
nRBC: 1.2 % — ABNORMAL HIGH (ref 0.0–0.2)

## 2019-12-24 LAB — HEMOGLOBIN AND HEMATOCRIT, BLOOD
HCT: 28.9 % — ABNORMAL LOW (ref 39.0–52.0)
Hemoglobin: 9 g/dL — ABNORMAL LOW (ref 13.0–17.0)

## 2019-12-24 LAB — BASIC METABOLIC PANEL
Anion gap: 14 (ref 5–15)
BUN: 57 mg/dL — ABNORMAL HIGH (ref 8–23)
CO2: 20 mmol/L — ABNORMAL LOW (ref 22–32)
Calcium: 8.3 mg/dL — ABNORMAL LOW (ref 8.9–10.3)
Chloride: 94 mmol/L — ABNORMAL LOW (ref 98–111)
Creatinine, Ser: 2.21 mg/dL — ABNORMAL HIGH (ref 0.61–1.24)
GFR calc Af Amer: 32 mL/min — ABNORMAL LOW (ref >60)
GFR calc non Af Amer: 28 mL/min — ABNORMAL LOW (ref >60)
Glucose, Bld: 107 mg/dL — ABNORMAL HIGH (ref 70–99)
Potassium: 4.1 mmol/L (ref 3.5–5.1)
Sodium: 128 mmol/L — ABNORMAL LOW (ref 135–145)

## 2019-12-24 LAB — PREPARE RBC (CROSSMATCH)

## 2019-12-24 MED ORDER — SODIUM CHLORIDE 0.9% IV SOLUTION: Freq: Once | INTRAVENOUS | Status: AC

## 2019-12-24 MED ORDER — TOLVAPTAN 15 MG PO TABS
15.0000 mg | ORAL_TABLET | Freq: Once | ORAL | Status: AC
  Administered 2019-12-24: 15 mg via ORAL
  Filled 2019-12-24: qty 1

## 2019-12-24 MED ORDER — MIDODRINE HCL 5 MG PO TABS
5.0000 mg | ORAL_TABLET | Freq: Three times a day (TID) | ORAL | Status: DC
  Administered 2019-12-24 – 2019-12-30 (×16): 5 mg via ORAL
  Filled 2019-12-24 (×16): qty 1

## 2019-12-24 MED ORDER — SODIUM CHLORIDE 0.9% FLUSH
3.0000 mL | Freq: Two times a day (BID) | INTRAVENOUS | Status: DC
  Administered 2019-12-24 – 2019-12-30 (×10): 3 mL via INTRAVENOUS

## 2019-12-24 NOTE — Progress Notes (Signed)
PROGRESS NOTE  Larry [name] HEN:[id number] DOB: 06/27/1941   PCP: Patient, No Pcp Per  Patient is from: Home.  DOA: 12/19/2019 LOS: 5  Brief Narrative / Interim history: 46-year male with history of coronary artery disease, CKD stage IIIb, Hypertension, chronic diastolic congestive heart failure, atrial flutter, hyperlipidemia who presents with weakness.  Or the last couple of days, he was too weak to get out of the bed.  He also had diarrhea with dark stools.  He follows with cardiology for his atrial flutter.  On presentation, his stool was found to be Hemoccult positive, had AKI, hemoglobin of 5.8, A. fib with RVR.  Cardiology and GI consulted.  Patient and family refused EGD/colonoscopy.  Hospital course significant for hyponatremia.  Cardiology and nephrology following  Subjective: No major events overnight or this morning.  No complaint this morning.  He denies chest pain, dyspnea, GI or UTI symptoms.  He has not had a bowel movement in the last 2 days.  He is agreeable to blood transfusion.   Objective: Vitals:   12/24/19 0904 12/24/19 1126 12/24/19 1337 12/24/19 1352  BP: (!) 90/59 (!) 88/59 (!) 85/68 95/64  Pulse: 75 83 81 97  Resp: 18 20 18 18   Temp: (!) 97.4 F (36.3 C) 98 F (36.7 C) (!) 97.5 F (36.4 C) 98.2 F (36.8 C)  TempSrc: Axillary Oral Oral Oral  SpO2: 100% 100% 100% 100%  Weight:      Height:        Intake/Output Summary (Last 24 hours) at 12/24/2019 1543 Last data filed at 12/24/2019 1452 Gross per 24 hour  Intake 1998 ml  Output 3825 ml  Net -1827 ml   Filed Weights   12/22/19 0458 12/23/19 0401 12/24/19 0546  Weight: 82.6 kg 81.2 kg 82.5 kg    Examination:  GENERAL: No acute distress.  Appears well.  HEENT: MMM.  Vision and hearing grossly intact.  NECK: Supple.  No apparent JVD.  RESP:  No IWOB. Good air movement bilaterally. CVS:  RRR. Heart sounds normal.  ABD/GI/GU: Bowel sounds present. Soft. Non tender.  MSK/EXT:  Moves extremities. No  apparent deformity. No edema.  SKIN: no apparent skin lesion or wound NEURO: Awake, alert and oriented appropriately.  No apparent focal neuro deficit. PSYCH: Calm. Normal affect.   Procedures:  None  Assessment & Plan: Acute blood loss anemia due to upper GI bleed in the setting of supratherapeutic INR: FOBT positive. Baseline Hgb 10-11> 5.8 (admit)>2u> 7.8>>> 7.8.  Anemia panel consistent with iron deficiency. -Received vitamin K for supratherapeutic INR -Received IV Feraheme x1 -Patient and family refused EGD/colonoscopy-prefer this to be done outpatient -Continue PPI Hemoglobin 5.8 on presentation.  GI consulted.  He was transfused with 2 units of PRBCs.  -Transfuse 1 unit.  Goal Hgb greater than 8.0 given history of CAD  Acute on chronic diastolic CHF/RV failure: Echo with EF of 60 to 65% with D-shaped septum, normal RVSF but dilated IVC.  On high-dose torsemide at home.  Responding to IV Lasix.  About 3.7 L UOP in the last 24 hours.  Renal function stable. -Advanced HF team managing-on IV Lasix 60 mg twice daily -Plan for [place] 3/18 -Possibly PYP scan to exclude infiltrative processes.  A. fib with RVR: Seems to be in NSR on amiodarone.  Previously on Tikosyn that was stopped due to QTC.  -Continue amiodarone per cardiology -Xarelto discontinued in the setting of GI bleed  CAD s/p PCI to RCA with stent thrombosis in 2000.  No chest pain. -Off Xarelto and ASA due to GI bleed -Continue statin -Transfusion for Hgb less than 8.0  AKI on CKD stage IIIb/azotemia: Baseline Cr 1.5-1.8 > 2.2 (admit) > 2.05 > 2.21.  Likely cardiorenal.  Also on high-dose torsemide which may contribute. -On IV Lasix as above -Cardiology and nephrology following.  Hypervolemic hyponatremia:  In the setting of CHF and CKD.  Baseline Na~130 > 129 (admit) >> 123 > tolvaptan > 128  -Diuretics and tolvaptan per cardiology  Hypotension: Soft blood pressures. -Per cardiology  Hyperlipidemia:  -On  Pravachol  Leukocytosis: Most likely reactive.  No indication for antibiotic therapy for now.  Continue to monitor.  Generalized weakness/debility:  -PT/OT-HH                  DVT prophylaxis: SCD in the setting of GI bleed Code Status: Full code Family Communication: Patient and/or RN. Available if any question.   Discharge barrier: Acute on chronic CHF requiring IV diuretics Patient is from: Home Final disposition: Likely home in the next 48 to 72 hours once cleared by advanced heart failure team and H&H stable  Consultants: GI (off), nephrology, advanced heart failure team   Microbiology summarized: COVID-19 negative  Sch Meds:  Scheduled Meds: . amiodarone  400 mg Oral BID  . furosemide  60 mg Intravenous BID  . pantoprazole (PROTONIX) IV  40 mg Intravenous Q12H  . polyethylene glycol  17 g Oral Daily  . pravastatin  40 mg Oral QPM  . sodium chloride flush  3 mL Intravenous Q12H   Continuous Infusions: PRN Meds:.acetaminophen **OR** acetaminophen, ipratropium-albuterol, ondansetron **OR** ondansetron (ZOFRAN) IV  Antimicrobials: Anti-infectives (From admission, onward)   None       I have personally reviewed the following labs and images: CBC: Recent Labs  Lab 12/19/19 1300 12/19/19 2031 12/20/19 0747 12/21/19 0453 12/22/19 0421 12/23/19 0308 12/24/19 0535  WBC 14.5*   < > 15.7* 21.6* 20.2* 16.6* 13.8*  NEUTROABS 11.4*  --   --  17.9* 16.3* 13.1* 10.8*  HGB 5.8*   < > 7.8* 7.5* 7.7* 7.6* 7.8*  HCT 20.1*   < > 24.6* 24.0* 25.8* 24.2* 25.6*  MCV 84.8   < > 83.7 83.6 85.1 82.0 83.4  PLT 336   < > 290 279 285 319 364   < > = values in this interval not displayed.   BMP &GFR Recent Labs  Lab 12/20/19 0747 12/21/19 0453 12/22/19 0421 12/23/19 0308 12/24/19 0535  NA 128* 125* 123* 124* 128*  K 4.9 4.1 4.1 5.0 4.1  CL 94* 93* 92* 94* 94*  CO2 21* 19* 17* 18* 20*  GLUCOSE 142* 156* 126* 113* 107*  BUN 80* 61* 59* 62* 57*  CREATININE  2.31* 2.05* 2.12* 2.18* 2.21*  CALCIUM 8.5* 8.0* 8.0* 8.1* 8.3*   Estimated Creatinine Clearance: 31.2 mL/min (A) (by C-G formula based on SCr of 2.21 mg/dL (H)). Liver & Pancreas: Recent Labs  Lab 12/19/19 1500  AST 19  ALT 13  ALKPHOS 101  BILITOT 2.1*  PROT 6.1*  ALBUMIN 3.0*   No results for input(s): LIPASE, AMYLASE in the last 168 hours. No results for input(s): AMMONIA in the last 168 hours. Diabetic: No results for input(s): HGBA1C in the last 72 hours. No results for input(s): GLUCAP in the last 168 hours. Cardiac Enzymes: No results for input(s): CKTOTAL, CKMB, CKMBINDEX, TROPONINI in the last 168 hours. No results for input(s): PROBNP in the last 8760 hours. Coagulation Profile: Recent  Labs  Lab 12/19/19 1300 12/20/19 0747 12/21/19 0453  INR 4.1* 2.4* 1.9*   Thyroid Function Tests: No results for input(s): TSH, T4TOTAL, FREET4, T3FREE, THYROIDAB in the last 72 hours. Lipid Profile: No results for input(s): CHOL, HDL, LDLCALC, TRIG, CHOLHDL, LDLDIRECT in the last 72 hours. Anemia Panel: No results for input(s): VITAMINB12, FOLATE, FERRITIN, TIBC, IRON, RETICCTPCT in the last 72 hours. Urine analysis:    Component Value Date/Time   COLORURINE YELLOW 03/23/2018 [place] 03/23/2018 0727   LABSPEC 1.009 03/23/2018 0727   PHURINE 5.0 03/23/2018 0727   GLUCOSEU NEGATIVE 03/23/2018 0727   HGBUR NEGATIVE 03/23/2018 0727   BILIRUBINUR NEGATIVE 03/23/2018 0727   KETONESUR NEGATIVE 03/23/2018 0727   PROTEINUR NEGATIVE 03/23/2018 0727   NITRITE NEGATIVE 03/23/2018 0727   LEUKOCYTESUR NEGATIVE 03/23/2018 0727   Sepsis Labs: Invalid input(s): PROCALCITONIN, [place]  Microbiology: Recent Results (from the past 240 hour(s))  SARS CORONAVIRUS 2 (TAT 6-24 HRS) Nasopharyngeal Nasopharyngeal Swab     Status: None   Collection Time: 12/19/19  1:53 PM   Specimen: Nasopharyngeal Swab  Result Value Ref Range Status   SARS Coronavirus 2 NEGATIVE  NEGATIVE Final    Comment: (NOTE) SARS-CoV-2 target nucleic acids are NOT DETECTED. The SARS-CoV-2 RNA is generally detectable in upper and lower respiratory specimens during the acute phase of infection. Negative results do not preclude SARS-CoV-2 infection, do not rule out co-infections with other pathogens, and should not be used as the sole basis for treatment or other patient management decisions. Negative results must be combined with clinical observations, patient history, and epidemiological information. The expected result is Negative. Fact Sheet for Patients: SugarRoll.be Fact Sheet for Healthcare Providers: https://www.woods-mathews.com/ This test is not yet approved or cleared by the [place] FDA and  has been authorized for detection and/or diagnosis of SARS-CoV-2 by FDA under an Emergency Use Authorization (EUA). This EUA will remain  in effect (meaning this test can be used) for the duration of the COVID-19 declaration under Section 56 4(b)(1) of the Act, 21 U.S.C. section 360bbb-3(b)(1), unless the authorization is terminated or revoked sooner. Performed at [facility], [place] [address] [place], [place] [address]     Radiology Studies: No results found.    T. Polo  If 7PM-7AM, please contact night-coverage [email or web address] Password [facility] 12/24/2019, 3:43 PM

## 2019-12-24 NOTE — Progress Notes (Signed)
[facility] Progress Note    Assessment/ Plan:   1.  AKI on CKD 3b: in the setting of ABLA and Afib/ flutter.  Baseline appears to be around 1.8  UOP OK and Cr seems to be stabilized out.  Looks like a hemodynamic issue.  No indication for HD, UA bland, treat supportively. Stable overall.  Not really adding much to care- will sign off. Call with questions   2.  Hyponatremia: appears like getting vol overloaded with blood administration/ IVFs.  Urine Na < 10.  IV Lasix.  Takes torsemide at home.  Tolvaptan also added per AHF 3/15.    3.  GIB- off Xarelto, has been declining scope.  s/p feraheme x 1 3/15.    4.  Afib/flutter- not on AC at present d/t #3, on amiodarone now  5.  Soft BP: will add midodrine 5 TID  6.  Dispo: pending  Subjective:    Doing well.  No complaints.  Cr stable, robust UOP on Lasix and tolvaptan.  Getting 1 u PRBCs   Objective:   BP 95/64 (BP Location: Left Arm)   Pulse 97   Temp 98.2 F (36.8 C) (Oral)   Resp 18   Ht 6' (1.829 m)   Wt 82.5 kg   SpO2 100%   BMI 24.66 kg/m   Intake/Output Summary (Last 24 hours) at 12/24/2019 1539 Last data filed at 12/24/2019 1452 Gross per 24 hour  Intake 1998 ml  Output 3825 ml  Net -1827 ml   Weight change: 1.264 kg  Physical Exam: GEN NAD, sitting in chair HEENT EOMI PERRL NECK JVD improved PULM normal WOB, bibasilar crackles improved, still some on L CV tachy ABD soft EXT 1+ LE edema NEURO AAO x 3 nonfocal SKIN no rashes  Imaging: US RENAL  Result Date: 12/23/2019 CLINICAL DATA:  Acute kidney injury. EXAM: RENAL / URINARY TRACT ULTRASOUND COMPLETE COMPARISON:  March 23, 2018. FINDINGS: Right Kidney: Renal measurements: 10.7 x 5.4 x 5.7 cm = volume: 73 mL. 3 mm rounded calcification is seen involving the upper pole which may represent large nonobstructive calculus. Echogenicity within normal limits. No mass or hydronephrosis visualized. Left Kidney: Renal measurements: 9.7 x 5.4 x 5.0  cm = volume: 137 mL. Echogenicity within normal limits. No mass or hydronephrosis visualized. Bladder: Appears normal for degree of bladder distention. Other: Minimal ascites is noted in the left lower quadrant. Moderate prostatic enlargement is noted. IMPRESSION: 3 cm rounded calcification is seen involving upper pole of right kidney which may represent large nonobstructive calculus. CT scan may be performed for further evaluation. No hydronephrosis or renal obstruction is noted. Minimal ascites is noted.  Moderate prostatic enlargement. Electronically Signed   By: Marijo Conception M.D.   On: 12/23/2019 10:32    Labs: BMET Recent Labs  Lab 12/19/19 1300 12/20/19 0747 12/21/19 0453 12/22/19 0421 12/23/19 0308 12/24/19 0535  NA 129* 128* 125* 123* 124* 128*  K 5.6* 4.9 4.1 4.1 5.0 4.1  CL 94* 94* 93* 92* 94* 94*  CO2 19* 21* 19* 17* 18* 20*  GLUCOSE 133* 142* 156* 126* 113* 107*  BUN 83* 80* 61* 59* 62* 57*  CREATININE 2.20* 2.31* 2.05* 2.12* 2.18* 2.21*  CALCIUM 9.1 8.5* 8.0* 8.0* 8.1* 8.3*   CBC Recent Labs  Lab 12/21/19 0453 12/22/19 0421 12/23/19 0308 12/24/19 0535  WBC 21.6* 20.2* 16.6* 13.8*  NEUTROABS 17.9* 16.3* 13.1* 10.8*  HGB 7.5* 7.7* 7.6* 7.8*  HCT 24.0* 25.8* 24.2* 25.6*  MCV 83.6 85.1 82.0 83.4  PLT 279 285 319 364    Medications:    . amiodarone  400 mg Oral BID  . furosemide  60 mg Intravenous BID  . pantoprazole (PROTONIX) IV  40 mg Intravenous Q12H  . polyethylene glycol  17 g Oral Daily  . pravastatin  40 mg Oral QPM  . sodium chloride flush  3 mL Intravenous Q12H      Madelon Lips, MD 12/24/2019, 3:39 PM

## 2019-12-24 NOTE — Progress Notes (Signed)
Patient ID: Larry [name], male   DOB: January 24, 1941, 79 y.o.   MRN: [id number]      Advanced Heart Failure Rounding Note  PCP-Cardiologist: No primary care provider on file.   Subjective:    Admitted with GI bleed. Received 2 UPRBCs. Refused scope.  Hgb 7.6 => 7.8, got IV Fe.  No stool.   Yesterday received tolvaptan and IV lasix again, I/Os net negative.   Breathing better and less fatigued.    Objective:   Weight Range: 82.5 kg Body mass index is 24.66 kg/m.   Vital Signs:   Temp:  [97.4 F (36.3 C)-98 F (36.7 C)] 98 F (36.7 C) (03/17 1126) Pulse Rate:  [72-83] 83 (03/17 1126) Resp:  [18-20] 20 (03/17 1126) BP: (86-102)/(59-66) 88/59 (03/17 1126) SpO2:  [98 %-100 %] 100 % (03/17 1126) Weight:  [82.5 kg] 82.5 kg (03/17 0546) Last BM Date: 12/21/19  Weight change: Filed Weights   12/22/19 0458 12/23/19 0401 12/24/19 0546  Weight: 82.6 kg 81.2 kg 82.5 kg    Intake/Output:   Intake/Output Summary (Last 24 hours) at 12/24/2019 1324 Last data filed at 12/24/2019 1222 Gross per 24 hour  Intake 1180 ml  Output 3075 ml  Net -1895 ml      Physical Exam    General: NAD Neck: JVP 10-12, no thyromegaly or thyroid nodule.  Lungs: Clear to auscultation bilaterally with normal respiratory effort. CV: Nondisplaced PMI.  Heart regular S1/S2, no S3/S4, no murmur.  No peripheral edema.   Abdomen: Soft, nontender, no hepatosplenomegaly, no distention.  Skin: Intact without lesions or rashes.  Neurologic: Alert and oriented x 3.  Psych: Normal affect. Extremities: No clubbing or cyanosis.  HEENT: Normal.    Telemetry   SR 70-80s (personally reviewed)  EKG    n/a  Labs    CBC Recent Labs    12/23/19 0308 12/24/19 0535  WBC 16.6* 13.8*  NEUTROABS 13.1* 10.8*  HGB 7.6* 7.8*  HCT 24.2* 25.6*  MCV 82.0 83.4  PLT 319 409   Basic Metabolic Panel Recent Labs    12/23/19 0308 12/24/19 0535  NA 124* 128*  K 5.0 4.1  CL 94* 94*  CO2 18* 20*  GLUCOSE 113*  107*  BUN 62* 57*  CREATININE 2.18* 2.21*  CALCIUM 8.1* 8.3*   Liver Function Tests No results for input(s): AST, ALT, ALKPHOS, BILITOT, PROT, ALBUMIN in the last 72 hours. No results for input(s): LIPASE, AMYLASE in the last 72 hours. Cardiac Enzymes No results for input(s): CKTOTAL, CKMB, CKMBINDEX, TROPONINI in the last 72 hours.  BNP: BNP (last 3 results) Recent Labs    05/12/19 1200 12/19/19 1404  BNP 469.5* 192.2*    ProBNP (last 3 results) No results for input(s): PROBNP in the last 8760 hours.   D-Dimer No results for input(s): DDIMER in the last 72 hours. Hemoglobin A1C No results for input(s): HGBA1C in the last 72 hours. Fasting Lipid Panel No results for input(s): CHOL, HDL, LDLCALC, TRIG, CHOLHDL, LDLDIRECT in the last 72 hours. Thyroid Function Tests No results for input(s): TSH, T4TOTAL, T3FREE, THYROIDAB in the last 72 hours.  Invalid input(s): FREET3  Other results:   Imaging    No results found.   Medications:     Scheduled Medications: . sodium chloride   Intravenous Once  . amiodarone  400 mg Oral BID  . furosemide  60 mg Intravenous BID  . pantoprazole (PROTONIX) IV  40 mg Intravenous Q12H  . polyethylene glycol  17 g Oral  Daily  . pravastatin  40 mg Oral QPM  . tolvaptan  15 mg Oral Q24H    Infusions:   PRN Medications: acetaminophen **OR** acetaminophen, ipratropium-albuterol, ondansetron **OR** ondansetron (ZOFRAN) IV     Assessment/Plan   1. Acute on chronic diastolic CHF: With significant component of RV failure.  Echo this admission showed EF 60-65% with D-shaped septum, mild-moderate RV dilation and normal RV systolic function, IVC dilated. On high dose torsemide at home. Good diuresis again yesterday.  On exam, I think that he is still volume overloaded.  Creatinine 2.12 => 2.18 => 2.21.   - Continue Lasix 60 mg IV bid and will give a dose of tolvaptan 15 mg again today (Na slowly rising).  Follow creatinine closely.    - SBP 90s generally, if drops lower consider midodrine.  - With elevated creatinine and suspicion for ongoing volume overload, I am going to arrange for RHC tomorrow to confirm filling pressures.  Discussed risks/benefits with patient and he agrees.  - With atrial fibrillation and prominent diastolic CHF, cardiac amyloidosis is a concern.Myeloma panelwas negative. In the past, I have recommended a PYP scan and we discussed cardiac amyloidosis. Will see if we can do it this admission.  2. CAD: PCI to RCA, had stent thrombosis back in 2000.No CP. - He has not been on ASA given Xarelto use. Would hold for now with GI bleeding.   - Continue pravastatin,good lipids in 9/20. 3. Atrial fibrillation: Paroxysmal, looks like NSR today on amiodarone.  He had to stop Tikosyn due to long QTc on lowest dose Tikosyn. He is now off Xarelto with GI bleeding. - Confirm rhythm with ECG.  - amiodarone changed to po.   - In the absence of GI workup with EGD/colonoscopy, it is going to be hard to determine when he can resume anticoagulation (see below).  - When anticoagulation restarted, would use apixaban as this seems to have somewhat less GI bleeding risk.  4. GI bleeding/microcytic anemia: Fe deficiency. Hgb down to 7.6 in 8/20.  At the time, I recommended GI workup but he refused.  This admission, hgb markedly low and stool is heme+.  He has had 2 units PRBCs.  He again refuses inpatient GI workup, says he is "too weak."  He is willing to see a GI MD in [place] as an outpatient.  - Got IV Fe this admission.  - Will readdress GI workup prior to discharge, he may be willing to have it done if he starts to feel better.  5. Hyponatremia: Hypervolemic hyponatremia.  Na higher at 128 today.   - Will give dose of tolvaptan 15 mg x 1 again today, then stop.  - Fluid restrict.  6. AKI on CKD stage 3: In setting of GI bleeding and anemia.  However, now appears volume overloaded.  BUN/creatinine elevated from  baseline but fairly stable today.   [name] 12/24/2019 1:24 PM

## 2019-12-24 NOTE — Progress Notes (Signed)
BP lower than usual, RN checked on patient and he says he "feels great". Provider notified.

## 2019-12-24 NOTE — Plan of Care (Signed)
  Problem: Education: Goal: Knowledge of General Education information will improve Description Including pain rating scale, medication(s)/side effects and non-pharmacologic comfort measures Outcome: Progressing   

## 2019-12-24 NOTE — Plan of Care (Signed)

## 2019-12-24 NOTE — H&P (View-Only) (Signed)
Patient ID: Larry [name], male   DOB: Feb 27, 1941, 79 y.o.   MRN: [id number]      Advanced Heart Failure Rounding Note  PCP-Cardiologist: No primary care provider on file.   Subjective:    Admitted with GI bleed. Received 2 UPRBCs. Refused scope.  Hgb 7.6 => 7.8, got IV Fe.  No stool.   Yesterday received tolvaptan and IV lasix again, I/Os net negative.   Breathing better and less fatigued.    Objective:   Weight Range: 82.5 kg Body mass index is 24.66 kg/m.   Vital Signs:   Temp:  [97.4 F (36.3 C)-98 F (36.7 C)] 98 F (36.7 C) (03/17 1126) Pulse Rate:  [72-83] 83 (03/17 1126) Resp:  [18-20] 20 (03/17 1126) BP: (86-102)/(59-66) 88/59 (03/17 1126) SpO2:  [98 %-100 %] 100 % (03/17 1126) Weight:  [82.5 kg] 82.5 kg (03/17 0546) Last BM Date: 12/21/19  Weight change: Filed Weights   12/22/19 0458 12/23/19 0401 12/24/19 0546  Weight: 82.6 kg 81.2 kg 82.5 kg    Intake/Output:   Intake/Output Summary (Last 24 hours) at 12/24/2019 1324 Last data filed at 12/24/2019 1222 Gross per 24 hour  Intake 1180 ml  Output 3075 ml  Net -1895 ml      Physical Exam    General: NAD Neck: JVP 10-12, no thyromegaly or thyroid nodule.  Lungs: Clear to auscultation bilaterally with normal respiratory effort. CV: Nondisplaced PMI.  Heart regular S1/S2, no S3/S4, no murmur.  No peripheral edema.   Abdomen: Soft, nontender, no hepatosplenomegaly, no distention.  Skin: Intact without lesions or rashes.  Neurologic: Alert and oriented x 3.  Psych: Normal affect. Extremities: No clubbing or cyanosis.  HEENT: Normal.    Telemetry   SR 70-80s (personally reviewed)  EKG    n/a  Labs    CBC Recent Labs    12/23/19 0308 12/24/19 0535  WBC 16.6* 13.8*  NEUTROABS 13.1* 10.8*  HGB 7.6* 7.8*  HCT 24.2* 25.6*  MCV 82.0 83.4  PLT 319 103   Basic Metabolic Panel Recent Labs    12/23/19 0308 12/24/19 0535  NA 124* 128*  K 5.0 4.1  CL 94* 94*  CO2 18* 20*  GLUCOSE 113*  107*  BUN 62* 57*  CREATININE 2.18* 2.21*  CALCIUM 8.1* 8.3*   Liver Function Tests No results for input(s): AST, ALT, ALKPHOS, BILITOT, PROT, ALBUMIN in the last 72 hours. No results for input(s): LIPASE, AMYLASE in the last 72 hours. Cardiac Enzymes No results for input(s): CKTOTAL, CKMB, CKMBINDEX, TROPONINI in the last 72 hours.  BNP: BNP (last 3 results) Recent Labs    05/12/19 1200 12/19/19 1404  BNP 469.5* 192.2*    ProBNP (last 3 results) No results for input(s): PROBNP in the last 8760 hours.   D-Dimer No results for input(s): DDIMER in the last 72 hours. Hemoglobin A1C No results for input(s): HGBA1C in the last 72 hours. Fasting Lipid Panel No results for input(s): CHOL, HDL, LDLCALC, TRIG, CHOLHDL, LDLDIRECT in the last 72 hours. Thyroid Function Tests No results for input(s): TSH, T4TOTAL, T3FREE, THYROIDAB in the last 72 hours.  Invalid input(s): FREET3  Other results:   Imaging    No results found.   Medications:     Scheduled Medications: . sodium chloride   Intravenous Once  . amiodarone  400 mg Oral BID  . furosemide  60 mg Intravenous BID  . pantoprazole (PROTONIX) IV  40 mg Intravenous Q12H  . polyethylene glycol  17 g Oral  Daily  . pravastatin  40 mg Oral QPM  . tolvaptan  15 mg Oral Q24H    Infusions:   PRN Medications: acetaminophen **OR** acetaminophen, ipratropium-albuterol, ondansetron **OR** ondansetron (ZOFRAN) IV     Assessment/Plan   1. Acute on chronic diastolic CHF: With significant component of RV failure.  Echo this admission showed EF 60-65% with D-shaped septum, mild-moderate RV dilation and normal RV systolic function, IVC dilated. On high dose torsemide at home. Good diuresis again yesterday.  On exam, I think that he is still volume overloaded.  Creatinine 2.12 => 2.18 => 2.21.   - Continue Lasix 60 mg IV bid and will give a dose of tolvaptan 15 mg again today (Na slowly rising).  Follow creatinine closely.    - SBP 90s generally, if drops lower consider midodrine.  - With elevated creatinine and suspicion for ongoing volume overload, I am going to arrange for RHC tomorrow to confirm filling pressures.  Discussed risks/benefits with patient and he agrees.  - With atrial fibrillation and prominent diastolic CHF, cardiac amyloidosis is a concern.Myeloma panelwas negative. In the past, I have recommended a PYP scan and we discussed cardiac amyloidosis. Will see if we can do it this admission.  2. CAD: PCI to RCA, had stent thrombosis back in 2000.No CP. - He has not been on ASA given Xarelto use. Would hold for now with GI bleeding.   - Continue pravastatin,good lipids in 9/20. 3. Atrial fibrillation: Paroxysmal, looks like NSR today on amiodarone.  He had to stop Tikosyn due to long QTc on lowest dose Tikosyn. He is now off Xarelto with GI bleeding. - Confirm rhythm with ECG.  - amiodarone changed to po.   - In the absence of GI workup with EGD/colonoscopy, it is going to be hard to determine when he can resume anticoagulation (see below).  - When anticoagulation restarted, would use apixaban as this seems to have somewhat less GI bleeding risk.  4. GI bleeding/microcytic anemia: Fe deficiency. Hgb down to 7.6 in 8/20.  At the time, I recommended GI workup but he refused.  This admission, hgb markedly low and stool is heme+.  He has had 2 units PRBCs.  He again refuses inpatient GI workup, says he is "too weak."  He is willing to see a GI MD in [place] as an outpatient.  - Got IV Fe this admission.  - Will readdress GI workup prior to discharge, he may be willing to have it done if he starts to feel better.  5. Hyponatremia: Hypervolemic hyponatremia.  Na higher at 128 today.   - Will give dose of tolvaptan 15 mg x 1 again today, then stop.  - Fluid restrict.  6. AKI on CKD stage 3: In setting of GI bleeding and anemia.  However, now appears volume overloaded.  BUN/creatinine elevated from  baseline but fairly stable today.   Loralie [name] 12/24/2019 1:24 PM

## 2019-12-24 NOTE — Plan of Care (Signed)
  Problem: Clinical Measurements: Goal: Respiratory complications will improve Outcome: Progressing   

## 2019-12-24 NOTE — Progress Notes (Signed)
PT Cancellation Note  Patient Details Name: Larry [name] MRN: [id number] DOB: [birth year]   Cancelled Treatment:    Reason Eval/Treat Not Completed: Fatigue/lethargy limiting ability to participate . Upon PT arrival, pt reports he is feeling too fatigued to participate today. Pt and his family were educated on role of PT in acute setting with goal of maintaining strength while in the hospital. All parties were in agreement and all questions were answered, and PT will continue to treat as time/schedule allow.   Karma Ganja, PT, DPT   Acute Rehabilitation Department Pager #: [phone]  Otho Bellows 12/24/2019, 2:48 PM

## 2019-12-25 ENCOUNTER — Inpatient Hospital Stay (HOSPITAL_COMMUNITY): Payer: PPO

## 2019-12-25 ENCOUNTER — Encounter (HOSPITAL_COMMUNITY): Payer: Self-pay | Admitting: Internal Medicine

## 2019-12-25 ENCOUNTER — Ambulatory Visit (HOSPITAL_COMMUNITY): Admission: RE | Admit: 2019-12-25 | Payer: PPO | Source: Home / Self Care | Admitting: Cardiology

## 2019-12-25 ENCOUNTER — Inpatient Hospital Stay (HOSPITAL_COMMUNITY)
Admission: EM | Disposition: A | Discharge status: Home Health | Payer: Self-pay | Source: Ambulatory Visit | Attending: Student

## 2019-12-25 ENCOUNTER — Encounter (HOSPITAL_COMMUNITY)
Admission: EM | Disposition: A | Discharge status: Home Health | Payer: Self-pay | Source: Ambulatory Visit | Attending: Student

## 2019-12-25 DIAGNOSIS — I340 Nonrheumatic mitral (valve) insufficiency: Secondary | ICD-10-CM

## 2019-12-25 DIAGNOSIS — I509 Heart failure, unspecified: Secondary | ICD-10-CM

## 2019-12-25 HISTORY — PX: TEE WITHOUT CARDIOVERSION: SHX5443

## 2019-12-25 HISTORY — PX: RIGHT HEART CATH: CATH118263

## 2019-12-25 LAB — POCT I-STAT EG7
Acid-base deficit: 3 mmol/L — ABNORMAL HIGH (ref 0.0–2.0)
Acid-base deficit: 1 mmol/L (ref 0.0–2.0)
Acid-base deficit: 1 mmol/L (ref 0.0–2.0)
Acid-base deficit: 2 mmol/L (ref 0.0–2.0)
Acid-base deficit: 2 mmol/L (ref 0.0–2.0)
Acid-base deficit: 2 mmol/L (ref 0.0–2.0)
Acid-base deficit: 2 mmol/L (ref 0.0–2.0)
Acid-base deficit: 3 mmol/L — ABNORMAL HIGH (ref 0.0–2.0)
Acid-base deficit: 3 mmol/L — ABNORMAL HIGH (ref 0.0–2.0)
Bicarbonate: 20.4 mmol/L (ref 20.0–28.0)
Bicarbonate: 20.7 mmol/L (ref 20.0–28.0)
Bicarbonate: 20.8 mmol/L (ref 20.0–28.0)
Bicarbonate: 20.9 mmol/L (ref 20.0–28.0)
Bicarbonate: 21.2 mmol/L (ref 20.0–28.0)
Bicarbonate: 21.7 mmol/L (ref 20.0–28.0)
Bicarbonate: 22 mmol/L (ref 20.0–28.0)
Bicarbonate: 22.2 mmol/L (ref 20.0–28.0)
Bicarbonate: 22.3 mmol/L (ref 20.0–28.0)
Calcium, Ion: 1.14 mmol/L — ABNORMAL LOW (ref 1.15–1.40)
Calcium, Ion: 1.12 mmol/L — ABNORMAL LOW (ref 1.15–1.40)
Calcium, Ion: 1.13 mmol/L — ABNORMAL LOW (ref 1.15–1.40)
Calcium, Ion: 1.13 mmol/L — ABNORMAL LOW (ref 1.15–1.40)
Calcium, Ion: 1.15 mmol/L (ref 1.15–1.40)
Calcium, Ion: 1.15 mmol/L (ref 1.15–1.40)
Calcium, Ion: 1.16 mmol/L (ref 1.15–1.40)
Calcium, Ion: 1.16 mmol/L (ref 1.15–1.40)
Calcium, Ion: 1.16 mmol/L (ref 1.15–1.40)
HCT: 30 % — ABNORMAL LOW (ref 39.0–52.0)
HCT: 29 % — ABNORMAL LOW (ref 39.0–52.0)
HCT: 29 % — ABNORMAL LOW (ref 39.0–52.0)
HCT: 29 % — ABNORMAL LOW (ref 39.0–52.0)
HCT: 29 % — ABNORMAL LOW (ref 39.0–52.0)
HCT: 29 % — ABNORMAL LOW (ref 39.0–52.0)
HCT: 29 % — ABNORMAL LOW (ref 39.0–52.0)
HCT: 30 % — ABNORMAL LOW (ref 39.0–52.0)
HCT: 30 % — ABNORMAL LOW (ref 39.0–52.0)
Hemoglobin: 10.2 g/dL — ABNORMAL LOW (ref 13.0–17.0)
Hemoglobin: 10.2 g/dL — ABNORMAL LOW (ref 13.0–17.0)
Hemoglobin: 10.2 g/dL — ABNORMAL LOW (ref 13.0–17.0)
Hemoglobin: 9.9 g/dL — ABNORMAL LOW (ref 13.0–17.0)
Hemoglobin: 9.9 g/dL — ABNORMAL LOW (ref 13.0–17.0)
Hemoglobin: 9.9 g/dL — ABNORMAL LOW (ref 13.0–17.0)
Hemoglobin: 9.9 g/dL — ABNORMAL LOW (ref 13.0–17.0)
Hemoglobin: 9.9 g/dL — ABNORMAL LOW (ref 13.0–17.0)
Hemoglobin: 9.9 g/dL — ABNORMAL LOW (ref 13.0–17.0)
O2 Saturation: 87 %
O2 Saturation: 100 %
O2 Saturation: 54 %
O2 Saturation: 60 %
O2 Saturation: 60 %
O2 Saturation: 84 %
O2 Saturation: 84 %
O2 Saturation: 88 %
O2 Saturation: 91 %
Potassium: 4.2 mmol/L (ref 3.5–5.1)
Potassium: 4.1 mmol/L (ref 3.5–5.1)
Potassium: 4.2 mmol/L (ref 3.5–5.1)
Potassium: 4.2 mmol/L (ref 3.5–5.1)
Potassium: 4.2 mmol/L (ref 3.5–5.1)
Potassium: 4.2 mmol/L (ref 3.5–5.1)
Potassium: 4.2 mmol/L (ref 3.5–5.1)
Potassium: 4.2 mmol/L (ref 3.5–5.1)
Potassium: 4.3 mmol/L (ref 3.5–5.1)
Sodium: 130 mmol/L — ABNORMAL LOW (ref 135–145)
Sodium: 129 mmol/L — ABNORMAL LOW (ref 135–145)
Sodium: 129 mmol/L — ABNORMAL LOW (ref 135–145)
Sodium: 129 mmol/L — ABNORMAL LOW (ref 135–145)
Sodium: 129 mmol/L — ABNORMAL LOW (ref 135–145)
Sodium: 130 mmol/L — ABNORMAL LOW (ref 135–145)
Sodium: 130 mmol/L — ABNORMAL LOW (ref 135–145)
Sodium: 130 mmol/L — ABNORMAL LOW (ref 135–145)
Sodium: 131 mmol/L — ABNORMAL LOW (ref 135–145)
TCO2: 21 mmol/L — ABNORMAL LOW (ref 22–32)
TCO2: 22 mmol/L (ref 22–32)
TCO2: 22 mmol/L (ref 22–32)
TCO2: 22 mmol/L (ref 22–32)
TCO2: 22 mmol/L (ref 22–32)
TCO2: 23 mmol/L (ref 22–32)
TCO2: 23 mmol/L (ref 22–32)
TCO2: 23 mmol/L (ref 22–32)
TCO2: 23 mmol/L (ref 22–32)
pCO2, Ven: 29.6 mmHg — ABNORMAL LOW (ref 44.0–60.0)
pCO2, Ven: 26.4 mmHg — ABNORMAL LOW (ref 44.0–60.0)
pCO2, Ven: 29.7 mmHg — ABNORMAL LOW (ref 44.0–60.0)
pCO2, Ven: 29.8 mmHg — ABNORMAL LOW (ref 44.0–60.0)
pCO2, Ven: 30 mmHg — ABNORMAL LOW (ref 44.0–60.0)
pCO2, Ven: 30.3 mmHg — ABNORMAL LOW (ref 44.0–60.0)
pCO2, Ven: 34.2 mmHg — ABNORMAL LOW (ref 44.0–60.0)
pCO2, Ven: 34.5 mmHg — ABNORMAL LOW (ref 44.0–60.0)
pCO2, Ven: 34.9 mmHg — ABNORMAL LOW (ref 44.0–60.0)
pH, Ven: 7.447 — ABNORMAL HIGH (ref 7.250–7.430)
pH, Ven: 7.412 (ref 7.250–7.430)
pH, Ven: 7.416 (ref 7.250–7.430)
pH, Ven: 7.419 (ref 7.250–7.430)
pH, Ven: 7.445 — ABNORMAL HIGH (ref 7.250–7.430)
pH, Ven: 7.447 — ABNORMAL HIGH (ref 7.250–7.430)
pH, Ven: 7.461 — ABNORMAL HIGH (ref 7.250–7.430)
pH, Ven: 7.469 — ABNORMAL HIGH (ref 7.250–7.430)
pH, Ven: 7.506 — ABNORMAL HIGH (ref 7.250–7.430)
pO2, Ven: 49 mmHg — ABNORMAL HIGH (ref 32.0–45.0)
pO2, Ven: 239 mmHg — ABNORMAL HIGH (ref 32.0–45.0)
pO2, Ven: 28 mmHg — CL (ref 32.0–45.0)
pO2, Ven: 30 mmHg — CL (ref 32.0–45.0)
pO2, Ven: 31 mmHg — CL (ref 32.0–45.0)
pO2, Ven: 46 mmHg — ABNORMAL HIGH (ref 32.0–45.0)
pO2, Ven: 46 mmHg — ABNORMAL HIGH (ref 32.0–45.0)
pO2, Ven: 50 mmHg — ABNORMAL HIGH (ref 32.0–45.0)
pO2, Ven: 56 mmHg — ABNORMAL HIGH (ref 32.0–45.0)

## 2019-12-25 LAB — COMPREHENSIVE METABOLIC PANEL
ALT: 13 U/L (ref 0–44)
AST: 17 U/L (ref 15–41)
Albumin: 2.7 g/dL — ABNORMAL LOW (ref 3.5–5.0)
Alkaline Phosphatase: 134 U/L — ABNORMAL HIGH (ref 38–126)
Anion gap: 14 (ref 5–15)
BUN: 51 mg/dL — ABNORMAL HIGH (ref 8–23)
CO2: 20 mmol/L — ABNORMAL LOW (ref 22–32)
Calcium: 8.3 mg/dL — ABNORMAL LOW (ref 8.9–10.3)
Chloride: 95 mmol/L — ABNORMAL LOW (ref 98–111)
Creatinine, Ser: 2.17 mg/dL — ABNORMAL HIGH (ref 0.61–1.24)
GFR calc Af Amer: 33 mL/min — ABNORMAL LOW (ref >60)
GFR calc non Af Amer: 29 mL/min — ABNORMAL LOW (ref >60)
Glucose, Bld: 117 mg/dL — ABNORMAL HIGH (ref 70–99)
Potassium: 4.5 mmol/L (ref 3.5–5.1)
Sodium: 129 mmol/L — ABNORMAL LOW (ref 135–145)
Total Bilirubin: 3.1 mg/dL — ABNORMAL HIGH (ref 0.3–1.2)
Total Protein: 5.8 g/dL — ABNORMAL LOW (ref 6.5–8.1)

## 2019-12-25 LAB — TYPE AND SCREEN
ABO/RH(D): A POS
Antibody Screen: NEGATIVE
Unit division: 0

## 2019-12-25 LAB — CBC
HCT: 29.9 % — ABNORMAL LOW (ref 39.0–52.0)
Hemoglobin: 9.2 g/dL — ABNORMAL LOW (ref 13.0–17.0)
MCH: 26.5 pg (ref 26.0–34.0)
MCHC: 30.8 g/dL (ref 30.0–36.0)
MCV: 86.2 fL (ref 80.0–100.0)
Platelets: 359 10*3/uL (ref 150–400)
RBC: 3.47 MIL/uL — ABNORMAL LOW (ref 4.22–5.81)
RDW: 19.9 % — ABNORMAL HIGH (ref 11.5–15.5)
WBC: 14.4 10*3/uL — ABNORMAL HIGH (ref 4.0–10.5)
nRBC: 0.4 % — ABNORMAL HIGH (ref 0.0–0.2)

## 2019-12-25 LAB — BPAM RBC
Blood Product Expiration Date: 202104142359
ISSUE DATE / TIME: 202103171318
Unit Type and Rh: 6200

## 2019-12-25 LAB — MAGNESIUM: Magnesium: 2.7 mg/dL — ABNORMAL HIGH (ref 1.7–2.4)

## 2019-12-25 SURGERY — ECHOCARDIOGRAM, TRANSESOPHAGEAL
Anesthesia: Moderate Sedation

## 2019-12-25 SURGERY — RIGHT HEART CATH
Anesthesia: LOCAL

## 2019-12-25 MED ORDER — ASPIRIN 81 MG PO CHEW: 81.0000 mg | CHEWABLE_TABLET | ORAL | Status: DC

## 2019-12-25 MED ORDER — LIDOCAINE HCL (PF) 1 % IJ SOLN
INTRAMUSCULAR | Status: DC | PRN
  Administered 2019-12-25 (×2): 2 mL

## 2019-12-25 MED ORDER — BUTAMBEN-TETRACAINE-BENZOCAINE 2-2-14 % EX AERO
INHALATION_SPRAY | CUTANEOUS | Status: DC | PRN
  Administered 2019-12-25: 2 via TOPICAL

## 2019-12-25 MED ORDER — FENTANYL CITRATE (PF) 100 MCG/2ML IJ SOLN
INTRAMUSCULAR | Status: AC
  Filled 2019-12-25: qty 2

## 2019-12-25 MED ORDER — HEPARIN (PORCINE) IN NACL 1000-0.9 UT/500ML-% IV SOLN
INTRAVENOUS | Status: AC
  Filled 2019-12-25: qty 500

## 2019-12-25 MED ORDER — SODIUM CHLORIDE 0.9% FLUSH: 3.0000 mL | INTRAVENOUS | Status: DC | PRN

## 2019-12-25 MED ORDER — SODIUM CHLORIDE 0.9 % IV SOLN: INTRAVENOUS | Status: DC

## 2019-12-25 MED ORDER — SODIUM CHLORIDE 0.9 % IV SOLN
INTRAVENOUS | Status: DC | PRN
  Administered 2019-12-25: 250 mL via INTRAVENOUS

## 2019-12-25 MED ORDER — LIDOCAINE HCL (PF) 1 % IJ SOLN
INTRAMUSCULAR | Status: AC
  Filled 2019-12-25: qty 30

## 2019-12-25 MED ORDER — HEPARIN (PORCINE) IN NACL 1000-0.9 UT/500ML-% IV SOLN
INTRAVENOUS | Status: DC | PRN
  Administered 2019-12-25 (×2): 500 mL

## 2019-12-25 MED ORDER — FENTANYL CITRATE (PF) 100 MCG/2ML IJ SOLN
INTRAMUSCULAR | Status: DC | PRN
  Administered 2019-12-25: 25 ug via INTRAVENOUS

## 2019-12-25 MED ORDER — ASPIRIN 81 MG PO CHEW
81.0000 mg | CHEWABLE_TABLET | ORAL | Status: AC
  Administered 2019-12-25: 81 mg via ORAL
  Filled 2019-12-25: qty 1

## 2019-12-25 MED ORDER — MIDAZOLAM HCL (PF) 5 MG/ML IJ SOLN
INTRAMUSCULAR | Status: AC
  Filled 2019-12-25: qty 2

## 2019-12-25 MED ORDER — SODIUM CHLORIDE 0.9 % IV SOLN: 250.0000 mL | INTRAVENOUS | Status: DC | PRN

## 2019-12-25 MED ORDER — SODIUM CHLORIDE 0.9 % IV SOLN
510.0000 mg | Freq: Once | INTRAVENOUS | Status: AC
  Administered 2019-12-25: 510 mg via INTRAVENOUS
  Filled 2019-12-25: qty 17

## 2019-12-25 MED ORDER — MIDAZOLAM HCL (PF) 10 MG/2ML IJ SOLN
INTRAMUSCULAR | Status: DC | PRN
  Administered 2019-12-25: 2 mg via INTRAVENOUS

## 2019-12-25 MED ORDER — FUROSEMIDE 10 MG/ML IJ SOLN
80.0000 mg | Freq: Two times a day (BID) | INTRAMUSCULAR | Status: DC
  Administered 2019-12-25 – 2019-12-27 (×4): 80 mg via INTRAVENOUS
  Filled 2019-12-25 (×2): qty 8

## 2019-12-25 SURGICAL SUPPLY — 6 items
CATH BALLN WEDGE 5F 110CM (CATHETERS) ×4 IMPLANT
PACK CARDIAC CATHETERIZATION (CUSTOM PROCEDURE TRAY) ×4 IMPLANT
SHEATH GLIDE SLENDER 4/5FR (SHEATH) ×4 IMPLANT
TRANSDUCER W/STOPCOCK (MISCELLANEOUS) ×2 IMPLANT
TUBING ART PRESS 72  MALE/FEM (TUBING) ×2
TUBING ART PRESS 72 MALE/FEM (TUBING) ×1 IMPLANT

## 2019-12-25 NOTE — CV Procedure (Signed)
Procedure: TEE  Indication: ?ASD  Sedation: Versed 2 mg IV, Fentanyl 25 mcg IV  Findings: Please see echo section for full report.  Normal LV size and wall thickness.  EF 60-65%, no wall motion abnormalities.  Moderately dilated RV with normal systolic function.  D-shaped interventricular septum suggestive of RV pressure/volume overload.  Moderate left atrial enlargement, no LA appendage thrombus.  Moderate right atrial enlargement.  There is a prominent Eustachian valve.  There is a large secundum ASD with left to right flow, 2.9  X 1.1 cm.  There do appear to be adequate rims for closure. Trivial TR.  Mild mitral regurgitation.  Trileaflet aortic valve with no stenosis, trivial regurgitation.  Normal caliber thoracic aorta, minimal plaque.   Impression: Large secundum ASD.  Will need to discuss closure.   Larry [name] 12/25/2019 2:20 PM

## 2019-12-25 NOTE — Progress Notes (Signed)
Patient refused lasix injection because of his soft blood pressures.

## 2019-12-25 NOTE — Progress Notes (Signed)
Patient transporting to cath lab during shift report, he was alert and oriented upon greeting him and accompanied by his wife and daughter.

## 2019-12-25 NOTE — Progress Notes (Signed)
Patient has sign/held orders remaining that are noted to be for canceled procedures, sign/held orders applicable to current treatment plan have been released/implemented.

## 2019-12-25 NOTE — Progress Notes (Signed)
  PT Cancellation Note  Patient Details Name: Larry [name] MRN: [id number] DOB: Dec 09, 1940   Cancelled Treatment:    Reason Eval/Treat Not Completed: Patient at procedure or test/unavailable;Other (comment)  Pt off unit this am at cath lab and per RN note "Patient's family has requested pt be left alone by staff, so he can rest prior to scheduled TEE.". PT will continue to follow acutely.    Earney Navy, PTA Acute Rehabilitation Services Pager: [phone] Office: [phone]   12/25/2019, 12:52 PM

## 2019-12-25 NOTE — H&P (View-Only) (Signed)
Patient ID: Larry [name], male   DOB: [birth year], 79 y.o.   MRN: [id number]      Advanced Heart Failure Rounding Note  PCP-Cardiologist: No primary care provider on file.   Subjective:    Admitted with GI bleed. Received 3 U PRBCs total and IV Fe. Refused scopes.  Hgb 7.6 => 7.8 => 9.2.  No stool.   Yesterday received tolvaptan and IV lasix again, I/Os net negative.   He is back in atrial flutter this morning, rate controlled in 80s.   Breathing better and less fatigued.   RHC Procedural Findings: Hemodynamics (mmHg) RA mean 14 RV 38/11 PA 37/17, mean 25 PCWP mean 15 Oxygen saturations: SVC 54% High RA 60% Low RA 88% PA 88% PV 100% AO 100% Cardiac Output (Fick) 13.6  Cardiac Index (Fick) 6.64 PVR 0.74 WU Shunt fraction 3.7   Objective:   Weight Range: 82.5 kg Body mass index is 24.66 kg/m.   Vital Signs:   Temp:  [97.4 F (36.3 C)-98.2 F (36.8 C)] 97.8 F (36.6 C) (03/18 0507) Pulse Rate:  [75-97] 77 (03/18 0507) Resp:  [18-20] 19 (03/18 0507) BP: (85-97)/(59-71) 90/67 (03/18 0507) SpO2:  [92 %-100 %] 100 % (03/18 0732) Last BM Date: 12/21/19  Weight change: Filed Weights   12/22/19 0458 12/23/19 0401 12/24/19 0546  Weight: 82.6 kg 81.2 kg 82.5 kg    Intake/Output:   Intake/Output Summary (Last 24 hours) at 12/25/2019 0902 Last data filed at 12/25/2019 0715 Gross per 24 hour  Intake 1176.11 ml  Output 3275 ml  Net -2098.89 ml      Physical Exam    General: NAD Neck: JVP 10-12 cm, no thyromegaly or thyroid nodule.  Lungs: Clear to auscultation bilaterally with normal respiratory effort. CV: Nondisplaced PMI.  Heart irregular S1/S2, no S3/S4, no murmur.  1+ ankle edema.  Abdomen: Soft, nontender, no hepatosplenomegaly, no distention.  Skin: Intact without lesions or rashes.  Neurologic: Alert and oriented x 3.  Psych: Normal affect. Extremities: No clubbing or cyanosis.  HEENT: Normal.    Telemetry   Atrial flutter 70-80s (personally  reviewed)  EKG    n/a  Labs    CBC Recent Labs    12/23/19 0308 12/23/19 0308 12/24/19 0535 12/24/19 0535 12/24/19 1835 12/25/19 0447  WBC 16.6*   < > 13.8*  --   --  14.4*  NEUTROABS 13.1*  --  10.8*  --   --   --   HGB 7.6*   < > 7.8*   < > 9.0* 9.2*  HCT 24.2*   < > 25.6*   < > 28.9* 29.9*  MCV 82.0   < > 83.4  --   --  86.2  PLT 319   < > 364  --   --  359   < > = values in this interval not displayed.   Basic Metabolic Panel Recent Labs    12/24/19 0535 12/25/19 0447  NA 128* 129*  K 4.1 4.5  CL 94* 95*  CO2 20* 20*  GLUCOSE 107* 117*  BUN 57* 51*  CREATININE 2.21* 2.17*  CALCIUM 8.3* 8.3*  MG  --  2.7*   Liver Function Tests Recent Labs    12/25/19 0447  AST 17  ALT 13  ALKPHOS 134*  BILITOT 3.1*  PROT 5.8*  ALBUMIN 2.7*   No results for input(s): LIPASE, AMYLASE in the last 72 hours. Cardiac Enzymes No results for input(s): CKTOTAL, CKMB, CKMBINDEX, TROPONINI in the last 72  hours.  BNP: BNP (last 3 results) Recent Labs    05/12/19 1200 12/19/19 1404  BNP 469.5* 192.2*    ProBNP (last 3 results) No results for input(s): PROBNP in the last 8760 hours.   D-Dimer No results for input(s): DDIMER in the last 72 hours. Hemoglobin A1C No results for input(s): HGBA1C in the last 72 hours. Fasting Lipid Panel No results for input(s): CHOL, HDL, LDLCALC, TRIG, CHOLHDL, LDLDIRECT in the last 72 hours. Thyroid Function Tests No results for input(s): TSH, T4TOTAL, T3FREE, THYROIDAB in the last 72 hours.  Invalid input(s): FREET3  Other results:   Imaging    CARDIAC CATHETERIZATION  Result Date: 12/25/2019 1. Elevated right atrial pressure. 2. Mild pulmonary venous hypertension. 3. Shunt fraction 3.7 with shunt run suggesting significant ASD. He will need a TEE.     Medications:     Scheduled Medications: . [MAR Hold] amiodarone  400 mg Oral BID  . furosemide  80 mg Intravenous BID  . [MAR Hold] midodrine  5 mg Oral TID WC  .  [MAR Hold] pantoprazole (PROTONIX) IV  40 mg Intravenous Q12H  . [MAR Hold] polyethylene glycol  17 g Oral Daily  . [MAR Hold] pravastatin  40 mg Oral QPM  . [MAR Hold] sodium chloride flush  3 mL Intravenous Q12H    Infusions: . sodium chloride    . sodium chloride 10 mL/hr at 12/25/19 0526    PRN Medications: sodium chloride, [MAR Hold] acetaminophen **OR** [MAR Hold] acetaminophen, [MAR Hold] ipratropium-albuterol, [MAR Hold] ondansetron **OR** [MAR Hold] ondansetron (ZOFRAN) IV, sodium chloride flush     Assessment/Plan   1. Acute on chronic diastolic CHF: With significant component of RV failure.  Echo this admission showed EF 60-65% with D-shaped septum, mild-moderate RV dilation and normal RV systolic function, IVC dilated. On high dose torsemide at home. Good diuresis again yesterday.  Creatinine 2.12 => 2.18 => 2.21 => 2.17.  RHC is highly suggestive of ASD which would explain his RV failure.  Exam and RHC suggest ongoing right-sided CHF.  - Will give Lasix 80 mg IV bid for at least 1 more day.  No tolvaptan today, Na up to 129.  - He is on midodrine 5 mg tid with soft BP.   - He will need TEE to assess for ASD. Will try to do today.  2. CAD: PCI to RCA, had stent thrombosis back in 2000.No CP. - He has not been on ASA given Xarelto use. Would hold for now with GI bleeding.   - Continue pravastatin,good lipids in 9/20. 3. Atrial fibrillation: Paroxysmal, had been in NSR earlier in his admission but looks like he is in flutter today.  He had to stop Tikosyn due to long QTc on lowest dose Tikosyn. He is now off Xarelto with GI bleeding. - Confirm rhythm with ECG.  - amiodarone changed to po.   - In the absence of GI workup with EGD/colonoscopy, it is going to be hard to determine when he can resume anticoagulation (see below).  - When anticoagulation restarted, would use apixaban as this seems to have somewhat less GI bleeding risk.  - Will not be able to cardiovert without  anticoagulation but rate controlled currently.  4. GI bleeding/microcytic anemia: Fe deficiency. Hgb down to 7.6 in 8/20.  At the time, I recommended GI workup but he refused.  This admission, hgb markedly low and stool is heme+.  He has had 3 units PRBCs.  He again refuses inpatient GI workup,  says he is "too weak."  He is willing to see a GI MD in [place] as an outpatient. Got IV Fe this admission.  - Will readdress GI workup prior to discharge, he may be willing to have it done if he starts to feel better.  5. Hyponatremia: Hypervolemic hyponatremia.  Na higher at 129 today.   - Fluid restrict.  6. AKI on CKD stage 3: In setting of GI bleeding and anemia.  However, now appears volume overloaded.  BUN/creatinine elevated from baseline but fairly stable today.  7. Cardiac shunt: RHC suggests atrial level L>R shunt.  Will do TEE to look for ASD.   [name] 12/25/2019 9:02 AM

## 2019-12-25 NOTE — Progress Notes (Signed)
Patient's family has requested pt be left alone by staff, so he can rest prior to scheduled TEE.

## 2019-12-25 NOTE — Progress Notes (Signed)
OT Cancellation Note  Patient Details Name: Larry [name] MRN: [id number] DOB: 09-30-1941   Cancelled Treatment:     First attempt patient off floor to cath lab, second attempt RN reports patient trying to get a little rest before leaving the floor again for TEE. Will re-attempt 3/19 as schedule allows.  [place] OT office: [place] 12/25/2019, 11:44 AM

## 2019-12-25 NOTE — Progress Notes (Signed)
PROGRESS NOTE  Larry [name] GLO:[id number] DOB: 03-31-1941   PCP: Patient, No Pcp Per  Patient is from: Home.  DOA: 12/19/2019 LOS: 6  Brief Narrative / Interim history: 66-year male with history of coronary artery disease, CKD stage IIIb, Hypertension, chronic diastolic congestive heart failure, atrial flutter, hyperlipidemia who presents with weakness.  Or the last couple of days, he was too weak to get out of the bed.  He also had diarrhea with dark stools.  He follows with cardiology for his atrial flutter.  On presentation, his stool was found to be Hemoccult positive, had AKI, hemoglobin of 5.8, A. fib with RVR.  Cardiology and GI consulted.  Patient and family refused EGD/colonoscopy.  Hospital course significant for hyponatremia.  Cardiology (advanced HF team) and nephrology following.   [place] on 12/25/2019 revealed mild pulmonary venous hypertension, elevated right atrial pressure and possible ASD.  Subjective: No major events overnight or this morning.  No complaints other than difficulty sleeping.  Denies chest pain, dyspnea, GI or UTI symptoms.  Had [place] this morning that reveals mild pulmonary venous hypertension, elevated right atrial pressure and possible ASD.  Objective: Vitals:   12/25/19 1117 12/25/19 1145 12/25/19 1201 12/25/19 1301  BP: (!) 85/58 (!) 89/61 (!) 82/59 104/86  Pulse: 78  74 83  Resp: 14 15 16 13   Temp:   97.8 F (36.6 C) (!) 97 F (36.1 C)  TempSrc:   Oral Axillary  SpO2: 100%  100% 100%  Weight:      Height:        Intake/Output Summary (Last 24 hours) at 12/25/2019 1345 Last data filed at 12/25/2019 1203 Gross per 24 hour  Intake 261.11 ml  Output 3650 ml  Net -3388.89 ml   Filed Weights   12/22/19 0458 12/23/19 0401 12/24/19 0546  Weight: 82.6 kg 81.2 kg 82.5 kg    Examination:  GENERAL: No acute distress.  Appears well.  HEENT: MMM.  Vision and hearing grossly intact.  NECK: Supple.  No apparent JVD.  RESP:  No IWOB. Good air movement  bilaterally. CVS:  RRR. Heart sounds normal.  ABD/GI/GU: Bowel sounds present. Soft. Non tender.  MSK/EXT:  Moves extremities. No apparent deformity. No edema.  SKIN: no apparent skin lesion or wound NEURO: Awake, alert and oriented appropriately.  No apparent focal neuro deficit. PSYCH: Calm. Normal affect.  Procedures:  RHC on 12/25/2019 revealed mild pulmonary venous hypertension, elevated right atrial pressure and possible ASD.  Assessment & Plan: Acute blood loss anemia due to upper GI bleed in the setting of supratherapeutic INR: FOBT positive. Baseline Hgb 10-11> 5.8 (admit)>2u> 7.8>1u> 9.0> 9.2.  Anemia panel consistent with iron deficiency. -Received IV Feraheme x1 -Patient and family refused EGD/colonoscopy-prefer this to be done outpatient -Continue PPI -Goal Hgb greater than 8.0 given history of CAD  Acute on chronic diastolic CHF/RV failure: Echo with EF of 60 to 65% with D-shaped septum, normal RVSF but dilated IVC.  [place] on 3/18 as above.  On high-dose torsemide at home.  Responding to IV Lasix.  About 3 L UOP in the last 24 hours.  Renal function is stable -Advanced HF team managing-on IV Lasix 60 mg twice daily -Possibly PYP scan to exclude infiltrative processes.  Possible ASD: Concern about this on right heart catheterization. -Plan for TEE today  A. fib with RVR: Seems to be in NSR on amiodarone.  Previously on Tikosyn that was stopped due to QTC.  -Continue amiodarone per cardiology -Xarelto discontinued in the setting  of GI bleed  CAD s/p PCI to RCA with stent thrombosis in 2000.  No chest pain. -Off Xarelto and ASA due to GI bleed -Continue statin -Transfusion for Hgb less than 8.0  AKI on CKD stage IIIb/azotemia: Baseline Cr 1.5-1.8 > 2.2 (admit) > 2.05 > 2.21> 2.17.  Likely cardiorenal.  Also on high-dose torsemide which may contribute. -On IV Lasix as above -Cardiology and nephrology following.  Hypervolemic hyponatremia:  In the setting of CHF and  CKD.  Baseline Na~130 > 129 (admit) >> 123 > tolvaptan > 129 -Diuretics and tolvaptan per cardiology  Hypotension: Soft blood pressures. -Per cardiology  Hyperlipidemia:  -On Pravachol  Leukocytosis: Most likely reactive.  No indication for antibiotic therapy for now.  Continue to monitor.  Generalized weakness/debility:  -PT/OT-HH                  DVT prophylaxis: SCD in the setting of GI bleed Code Status: Full code Family Communication: Patient and/or RN.  Updated patient's wife and daughter at bedside.  Discharge barrier: Evaluation and treatment of acute on chronic CHF requiring IV diuretics Patient is from: Home Final disposition: Likely home in the next 11 to 72 hours once cleared by advanced heart failure team and H&H stable  Consultants: GI (off), nephrology, advanced heart failure team   Microbiology summarized: COVID-19 negative  Sch Meds:  Scheduled Meds: . [MAR Hold] amiodarone  400 mg Oral BID  . [MAR Hold] furosemide  80 mg Intravenous BID  . [MAR Hold] midodrine  5 mg Oral TID WC  . [MAR Hold] pantoprazole (PROTONIX) IV  40 mg Intravenous Q12H  . [MAR Hold] polyethylene glycol  17 g Oral Daily  . [MAR Hold] pravastatin  40 mg Oral QPM  . [MAR Hold] sodium chloride flush  3 mL Intravenous Q12H   Continuous Infusions: . sodium chloride 20 mL/hr at 12/25/19 1000  . [MAR Hold] ferumoxytol     PRN Meds:.[MAR Hold] acetaminophen **OR** [MAR Hold] acetaminophen, [place]-tetracaine-benzocaine, [MAR Hold] ipratropium-albuterol, [MAR Hold] ondansetron **OR** [MAR Hold] ondansetron (ZOFRAN) IV  Antimicrobials: Anti-infectives (From admission, onward)   None       I have personally reviewed the following labs and images: CBC: Recent Labs  Lab 12/19/19 1300 12/19/19 2031 12/21/19 0453 12/21/19 0453 12/22/19 0421 12/22/19 0421 12/23/19 0308 12/23/19 0308 12/24/19 0535 12/24/19 1835 12/25/19 0447 12/25/19 2956 12/25/19 0836  12/25/19 0837 12/25/19 0838 12/25/19 0843 12/25/19 0845  WBC 14.5*   < > 21.6*  --  20.2*  --  16.6*  --  13.8*  --  14.4*  --   --   --   --   --   --   NEUTROABS 11.4*  --  17.9*  --  16.3*  --  13.1*  --  10.8*  --   --   --   --   --   --   --   --   HGB 5.8*   < > 7.5*   < > 7.7*   < > 7.6*   < > 7.8*   < > 9.2*   < > 10.2* 9.9* 9.9* 9.9* 9.9*  HCT 20.1*   < > 24.0*   < > 25.8*   < > 24.2*   < > 25.6*   < > 29.9*   < > 30.0* 29.0* 29.0* 29.0* 29.0*  MCV 84.8   < > 83.6  --  85.1  --  82.0  --  83.4  --  86.2  --   --   --   --   --   --   PLT 336   < > 279  --  285  --  319  --  364  --  359  --   --   --   --   --   --    < > = values in this interval not displayed.   BMP &GFR Recent Labs  Lab 12/21/19 0453 12/21/19 0453 12/22/19 0421 12/22/19 0421 12/23/19 0308 12/23/19 0308 12/24/19 0535 12/24/19 0535 12/25/19 0447 12/25/19 0811 12/25/19 0836 12/25/19 0837 12/25/19 0838 12/25/19 0843 12/25/19 0845  NA 125*   < > 123*   < > 124*   < > 128*   < > 129*   < > 129* 129* 129* 129* 130*  K 4.1   < > 4.1   < > 5.0   < > 4.1   < > 4.5   < > 4.2 4.2 4.3 4.2 4.2  CL 93*  --  92*  --  94*  --  94*  --  95*  --   --   --   --   --   --   CO2 19*  --  17*  --  18*  --  20*  --  20*  --   --   --   --   --   --   GLUCOSE 156*  --  126*  --  113*  --  107*  --  117*  --   --   --   --   --   --   BUN 61*  --  59*  --  62*  --  57*  --  51*  --   --   --   --   --   --   CREATININE 2.05*  --  2.12*  --  2.18*  --  2.21*  --  2.17*  --   --   --   --   --   --   CALCIUM 8.0*  --  8.0*  --  8.1*  --  8.3*  --  8.3*  --   --   --   --   --   --   MG  --   --   --   --   --   --   --   --  2.7*  --   --   --   --   --   --    < > = values in this interval not displayed.   Estimated Creatinine Clearance: 31.8 mL/min (A) (by C-G formula based on SCr of 2.17 mg/dL (H)). Liver & Pancreas: Recent Labs  Lab 12/19/19 1500 12/25/19 0447  AST 19 17  ALT 13 13  ALKPHOS 101 134*  BILITOT  2.1* 3.1*  PROT 6.1* 5.8*  ALBUMIN 3.0* 2.7*   No results for input(s): LIPASE, AMYLASE in the last 168 hours. No results for input(s): AMMONIA in the last 168 hours. Diabetic: No results for input(s): HGBA1C in the last 72 hours. No results for input(s): GLUCAP in the last 168 hours. Cardiac Enzymes: No results for input(s): CKTOTAL, CKMB, CKMBINDEX, TROPONINI in the last 168 hours. No results for input(s): PROBNP in the last 8760 hours. Coagulation Profile: Recent Labs  Lab 12/19/19 1300 12/20/19 0747 12/21/19 0453  INR 4.1* 2.4* 1.9*   Thyroid Function Tests: No results for  input(s): TSH, T4TOTAL, FREET4, T3FREE, THYROIDAB in the last 72 hours. Lipid Profile: No results for input(s): CHOL, HDL, LDLCALC, TRIG, CHOLHDL, LDLDIRECT in the last 72 hours. Anemia Panel: No results for input(s): VITAMINB12, FOLATE, FERRITIN, TIBC, IRON, RETICCTPCT in the last 72 hours. Urine analysis:    Component Value Date/Time   COLORURINE YELLOW 03/23/2018 [place] 03/23/2018 0727   LABSPEC 1.009 03/23/2018 0727   PHURINE 5.0 03/23/2018 0727   GLUCOSEU NEGATIVE 03/23/2018 0727   HGBUR NEGATIVE 03/23/2018 0727   BILIRUBINUR NEGATIVE 03/23/2018 0727   KETONESUR NEGATIVE 03/23/2018 0727   PROTEINUR NEGATIVE 03/23/2018 0727   NITRITE NEGATIVE 03/23/2018 0727   LEUKOCYTESUR NEGATIVE 03/23/2018 0727   Sepsis Labs: Invalid input(s): PROCALCITONIN, [place]  Microbiology: Recent Results (from the past 240 hour(s))  SARS CORONAVIRUS 2 (TAT 6-24 HRS) Nasopharyngeal Nasopharyngeal Swab     Status: None   Collection Time: 12/19/19  1:53 PM   Specimen: Nasopharyngeal Swab  Result Value Ref Range Status   SARS Coronavirus 2 NEGATIVE NEGATIVE Final    Comment: (NOTE) SARS-CoV-2 target nucleic acids are NOT DETECTED. The SARS-CoV-2 RNA is generally detectable in upper and lower respiratory specimens during the acute phase of infection. Negative results do not preclude  SARS-CoV-2 infection, do not rule out co-infections with other pathogens, and should not be used as the sole basis for treatment or other patient management decisions. Negative results must be combined with clinical observations, patient history, and epidemiological information. The expected result is Negative. Fact Sheet for Patients: SugarRoll.be Fact Sheet for Healthcare Providers: https://www.woods-mathews.com/ This test is not yet approved or cleared by the [place] FDA and  has been authorized for detection and/or diagnosis of SARS-CoV-2 by FDA under an Emergency Use Authorization (EUA). This EUA will remain  in effect (meaning this test can be used) for the duration of the COVID-19 declaration under Section 56 4(b)(1) of the Act, 21 U.S.C. section 360bbb-3(b)(1), unless the authorization is terminated or revoked sooner. Performed at [facility], [facility] [address] [place], [place] [address]     Radiology Studies: CARDIAC CATHETERIZATION  Result Date: 12/25/2019 1. Elevated right atrial pressure. 2. Mild pulmonary venous hypertension. 3. Shunt fraction 3.7 with shunt run suggesting significant ASD. He will need a TEE.     Volney Reierson T. Kaplan  If 7PM-7AM, please contact night-coverage [email or web address] Password [email or web address] 12/25/2019, 1:45 PM

## 2019-12-25 NOTE — Interval H&P Note (Signed)
History and Physical Interval Note:  12/25/2019 7:59 AM  Larry [name]  has presented today for surgery, with the diagnosis of heart failure.  The various methods of treatment have been discussed with the patient and family. After consideration of risks, benefits and other options for treatment, the patient has consented to  Procedure(s): RIGHT HEART CATH (N/A) as a surgical intervention.  The patient's history has been reviewed, patient examined, no change in status, stable for surgery.  I have reviewed the patient's chart and labs.  Questions were answered to the patient's satisfaction.     [name] [employer]

## 2019-12-25 NOTE — Interval H&P Note (Signed)
History and Physical Interval Note:  12/25/2019 1:53 PM  Larry [name]  has presented today for surgery, with the diagnosis of asd.  The various methods of treatment have been discussed with the patient and family. After consideration of risks, benefits and other options for treatment, the patient has consented to  Procedure(s): TRANSESOPHAGEAL ECHOCARDIOGRAM (TEE) (N/A) as a surgical intervention.  The patient's history has been reviewed, patient examined, no change in status, stable for surgery.  I have reviewed the patient's chart and labs.  Questions were answered to the patient's satisfaction.     Larry [name] [employer]

## 2019-12-25 NOTE — Progress Notes (Signed)
CCMD notified of patient heading to Endo for TEE.

## 2019-12-25 NOTE — Progress Notes (Signed)
Patient returns to [facility] room 30, c/a/ox4, brachial site clean/dry. Pt denies complaints.

## 2019-12-25 NOTE — Progress Notes (Signed)
  Echocardiogram Echocardiogram Transesophageal has been performed.  Larry [name] 12/25/2019, 2:28 PM

## 2019-12-25 NOTE — Progress Notes (Signed)
I talked with patient after TEE.  He ideally will get atrial septal defect closure but needs GI bleeding investigated prior to that.  He is now agreeable to GI consultation and possible endoscopy.   [name] 12/25/2019 2:29 PM

## 2019-12-25 NOTE — Progress Notes (Signed)
Patient ID: Larry [name], male   DOB: Aug 03, 1941, 79 y.o.   MRN: [id number]      Advanced Heart Failure Rounding Note  PCP-Cardiologist: No primary care provider on file.   Subjective:    Admitted with GI bleed. Received 3 U PRBCs total and IV Fe. Refused scopes.  Hgb 7.6 => 7.8 => 9.2.  No stool.   Yesterday received tolvaptan and IV lasix again, I/Os net negative.   He is back in atrial flutter this morning, rate controlled in 80s.   Breathing better and less fatigued.   RHC Procedural Findings: Hemodynamics (mmHg) RA mean 14 RV 38/11 PA 37/17, mean 25 PCWP mean 15 Oxygen saturations: SVC 54% High RA 60% Low RA 88% PA 88% PV 100% AO 100% Cardiac Output (Fick) 13.6  Cardiac Index (Fick) 6.64 PVR 0.74 WU Shunt fraction 3.7   Objective:   Weight Range: 82.5 kg Body mass index is 24.66 kg/m.   Vital Signs:   Temp:  [97.4 F (36.3 C)-98.2 F (36.8 C)] 97.8 F (36.6 C) (03/18 0507) Pulse Rate:  [75-97] 77 (03/18 0507) Resp:  [18-20] 19 (03/18 0507) BP: (85-97)/(59-71) 90/67 (03/18 0507) SpO2:  [92 %-100 %] 100 % (03/18 0732) Last BM Date: 12/21/19  Weight change: Filed Weights   12/22/19 0458 12/23/19 0401 12/24/19 0546  Weight: 82.6 kg 81.2 kg 82.5 kg    Intake/Output:   Intake/Output Summary (Last 24 hours) at 12/25/2019 0902 Last data filed at 12/25/2019 0715 Gross per 24 hour  Intake 1176.11 ml  Output 3275 ml  Net -2098.89 ml      Physical Exam    General: NAD Neck: JVP 10-12 cm, no thyromegaly or thyroid nodule.  Lungs: Clear to auscultation bilaterally with normal respiratory effort. CV: Nondisplaced PMI.  Heart irregular S1/S2, no S3/S4, no murmur.  1+ ankle edema.  Abdomen: Soft, nontender, no hepatosplenomegaly, no distention.  Skin: Intact without lesions or rashes.  Neurologic: Alert and oriented x 3.  Psych: Normal affect. Extremities: No clubbing or cyanosis.  HEENT: Normal.    Telemetry   Atrial flutter 70-80s (personally  reviewed)  EKG    n/a  Labs    CBC Recent Labs    12/23/19 0308 12/23/19 0308 12/24/19 0535 12/24/19 0535 12/24/19 1835 12/25/19 0447  WBC 16.6*   < > 13.8*  --   --  14.4*  NEUTROABS 13.1*  --  10.8*  --   --   --   HGB 7.6*   < > 7.8*   < > 9.0* 9.2*  HCT 24.2*   < > 25.6*   < > 28.9* 29.9*  MCV 82.0   < > 83.4  --   --  86.2  PLT 319   < > 364  --   --  359   < > = values in this interval not displayed.   Basic Metabolic Panel Recent Labs    12/24/19 0535 12/25/19 0447  NA 128* 129*  K 4.1 4.5  CL 94* 95*  CO2 20* 20*  GLUCOSE 107* 117*  BUN 57* 51*  CREATININE 2.21* 2.17*  CALCIUM 8.3* 8.3*  MG  --  2.7*   Liver Function Tests Recent Labs    12/25/19 0447  AST 17  ALT 13  ALKPHOS 134*  BILITOT 3.1*  PROT 5.8*  ALBUMIN 2.7*   No results for input(s): LIPASE, AMYLASE in the last 72 hours. Cardiac Enzymes No results for input(s): CKTOTAL, CKMB, CKMBINDEX, TROPONINI in the last 72  hours.  BNP: BNP (last 3 results) Recent Labs    05/12/19 1200 12/19/19 1404  BNP 469.5* 192.2*    ProBNP (last 3 results) No results for input(s): PROBNP in the last 8760 hours.   D-Dimer No results for input(s): DDIMER in the last 72 hours. Hemoglobin A1C No results for input(s): HGBA1C in the last 72 hours. Fasting Lipid Panel No results for input(s): CHOL, HDL, LDLCALC, TRIG, CHOLHDL, LDLDIRECT in the last 72 hours. Thyroid Function Tests No results for input(s): TSH, T4TOTAL, T3FREE, THYROIDAB in the last 72 hours.  Invalid input(s): FREET3  Other results:   Imaging    CARDIAC CATHETERIZATION  Result Date: 12/25/2019 1. Elevated right atrial pressure. 2. Mild pulmonary venous hypertension. 3. Shunt fraction 3.7 with shunt run suggesting significant ASD. He will need a TEE.     Medications:     Scheduled Medications: . [MAR Hold] amiodarone  400 mg Oral BID  . furosemide  80 mg Intravenous BID  . [MAR Hold] midodrine  5 mg Oral TID WC  .  [MAR Hold] pantoprazole (PROTONIX) IV  40 mg Intravenous Q12H  . [MAR Hold] polyethylene glycol  17 g Oral Daily  . [MAR Hold] pravastatin  40 mg Oral QPM  . [MAR Hold] sodium chloride flush  3 mL Intravenous Q12H    Infusions: . sodium chloride    . sodium chloride 10 mL/hr at 12/25/19 0526    PRN Medications: sodium chloride, [MAR Hold] acetaminophen **OR** [MAR Hold] acetaminophen, [MAR Hold] ipratropium-albuterol, [MAR Hold] ondansetron **OR** [MAR Hold] ondansetron (ZOFRAN) IV, sodium chloride flush     Assessment/Plan   1. Acute on chronic diastolic CHF: With significant component of RV failure.  Echo this admission showed EF 60-65% with D-shaped septum, mild-moderate RV dilation and normal RV systolic function, IVC dilated. On high dose torsemide at home. Good diuresis again yesterday.  Creatinine 2.12 => 2.18 => 2.21 => 2.17.  RHC is highly suggestive of ASD which would explain his RV failure.  Exam and RHC suggest ongoing right-sided CHF.  - Will give Lasix 80 mg IV bid for at least 1 more day.  No tolvaptan today, Na up to 129.  - He is on midodrine 5 mg tid with soft BP.   - He will need TEE to assess for ASD. Will try to do today.  2. CAD: PCI to RCA, had stent thrombosis back in 2000.No CP. - He has not been on ASA given Xarelto use. Would hold for now with GI bleeding.   - Continue pravastatin,good lipids in 9/20. 3. Atrial fibrillation: Paroxysmal, had been in NSR earlier in his admission but looks like he is in flutter today.  He had to stop Tikosyn due to long QTc on lowest dose Tikosyn. He is now off Xarelto with GI bleeding. - Confirm rhythm with ECG.  - amiodarone changed to po.   - In the absence of GI workup with EGD/colonoscopy, it is going to be hard to determine when he can resume anticoagulation (see below).  - When anticoagulation restarted, would use apixaban as this seems to have somewhat less GI bleeding risk.  - Will not be able to cardiovert without  anticoagulation but rate controlled currently.  4. GI bleeding/microcytic anemia: Fe deficiency. Hgb down to 7.6 in 8/20.  At the time, I recommended GI workup but he refused.  This admission, hgb markedly low and stool is heme+.  He has had 3 units PRBCs.  He again refuses inpatient GI workup,  says he is "too weak."  He is willing to see a GI MD in [place] as an outpatient. Got IV Fe this admission.  - Will readdress GI workup prior to discharge, he may be willing to have it done if he starts to feel better.  5. Hyponatremia: Hypervolemic hyponatremia.  Na higher at 129 today.   - Fluid restrict.  6. AKI on CKD stage 3: In setting of GI bleeding and anemia.  However, now appears volume overloaded.  BUN/creatinine elevated from baseline but fairly stable today.  7. Cardiac shunt: RHC suggests atrial level L>R shunt.  Will do TEE to look for ASD.   [name] 12/25/2019 9:02 AM

## 2019-12-26 DIAGNOSIS — Q211 Atrial septal defect: Secondary | ICD-10-CM

## 2019-12-26 LAB — CBC
HCT: 30.2 % — ABNORMAL LOW (ref 39.0–52.0)
Hemoglobin: 9.3 g/dL — ABNORMAL LOW (ref 13.0–17.0)
MCH: 27 pg (ref 26.0–34.0)
MCHC: 30.8 g/dL (ref 30.0–36.0)
MCV: 87.8 fL (ref 80.0–100.0)
Platelets: 330 10*3/uL (ref 150–400)
RBC: 3.44 MIL/uL — ABNORMAL LOW (ref 4.22–5.81)
RDW: 21.4 % — ABNORMAL HIGH (ref 11.5–15.5)
WBC: 12.2 10*3/uL — ABNORMAL HIGH (ref 4.0–10.5)
nRBC: 0.3 % — ABNORMAL HIGH (ref 0.0–0.2)

## 2019-12-26 LAB — RENAL FUNCTION PANEL
Albumin: 2.7 g/dL — ABNORMAL LOW (ref 3.5–5.0)
Anion gap: 13 (ref 5–15)
BUN: 44 mg/dL — ABNORMAL HIGH (ref 8–23)
CO2: 20 mmol/L — ABNORMAL LOW (ref 22–32)
Calcium: 8.5 mg/dL — ABNORMAL LOW (ref 8.9–10.3)
Chloride: 97 mmol/L — ABNORMAL LOW (ref 98–111)
Creatinine, Ser: 2 mg/dL — ABNORMAL HIGH (ref 0.61–1.24)
GFR calc Af Amer: 36 mL/min — ABNORMAL LOW (ref >60)
GFR calc non Af Amer: 31 mL/min — ABNORMAL LOW (ref >60)
Glucose, Bld: 98 mg/dL (ref 70–99)
Phosphorus: 3.9 mg/dL (ref 2.5–4.6)
Potassium: 4.6 mmol/L (ref 3.5–5.1)
Sodium: 130 mmol/L — ABNORMAL LOW (ref 135–145)

## 2019-12-26 LAB — MAGNESIUM: Magnesium: 2.8 mg/dL — ABNORMAL HIGH (ref 1.7–2.4)

## 2019-12-26 MED ORDER — PEG 3350-KCL-NA BICARB-NACL 420 G PO SOLR
2000.0000 mL | Freq: Once | ORAL | Status: AC
  Administered 2019-12-27: 2000 mL via ORAL
  Filled 2019-12-26: qty 4000

## 2019-12-26 MED ORDER — PEG 3350-KCL-NA BICARB-NACL 420 G PO SOLR: 2000.0000 mL | Freq: Once | ORAL | Status: DC

## 2019-12-26 MED FILL — Lidocaine HCl Local Preservative Free (PF) Inj 1%: INTRAMUSCULAR | Qty: 30 | Status: AC

## 2019-12-26 NOTE — Plan of Care (Signed)
  Problem: Education: Goal: Knowledge of General Education information will improve Description Including pain rating scale, medication(s)/side effects and non-pharmacologic comfort measures Outcome: Progressing   

## 2019-12-26 NOTE — Progress Notes (Signed)
Physical Therapy Treatment Patient Details Name: Larry [name] MRN: [id number] DOB: 1941/07/31 Today's Date: 12/26/2019    History of Present Illness Pt adm with weakness and found to have upper GI bleed, AKI, and afib with RVR. PMH - cad, ckd, htn, chf, a flutter    PT Comments    Patient received in bed, wife present for session. Patient agrees to PT. Reports he is feeling better than yesterday, but still very weak. Patient is mod independent with bed mobility. Min assist for sit to stand with cues for hand placement. Patient ambulated 120 feet with RW and min guard assist. 2-3 brief standing rest breaks due to LE fatigue. Patient will continue to benefit from skilled PT while here to improve strength and functional independence to return home at discharge.     Follow Up Recommendations  Home health PT;Supervision for mobility/OOB     Equipment Recommendations  None recommended by PT    Recommendations for Other Services       Precautions / Restrictions Precautions Precautions: Fall Precaution Comments: mod fall Restrictions Weight Bearing Restrictions: No    Mobility  Bed Mobility Overal bed mobility: Modified Independent Bed Mobility: Supine to Sit;Sit to Supine     Supine to sit: Modified independent (Device/Increase time);HOB elevated Sit to supine: Modified independent (Device/Increase time)   General bed mobility comments: use of bed rail, increased time and effort  Transfers Overall transfer level: Needs assistance Equipment used: Rolling walker (2 wheeled) Transfers: Sit to/from Stand Sit to Stand: Min assist;From elevated surface         General transfer comment: Cues for hand placement with transfers  Ambulation/Gait Ambulation/Gait assistance: Min guard Gait Distance (Feet): 120 Feet Assistive device: Rolling walker (2 wheeled) Gait Pattern/deviations: Step-through pattern;Decreased stride length Gait velocity: decr   General Gait Details: min  guard, weak LEs requiring 2-3 brief standing rest breaks during ambulation.   Stairs             Wheelchair Mobility    Modified Rankin (Stroke Patients Only)       Balance Overall balance assessment: Needs assistance Sitting-balance support: Feet supported Sitting balance-Leahy Scale: Good     Standing balance support: Bilateral upper extremity supported;During functional activity Standing balance-Leahy Scale: Fair Standing balance comment: walker and min guard for activity                            Cognition Arousal/Alertness: Awake/alert Behavior During Therapy: WFL for tasks assessed/performed Overall Cognitive Status: Within Functional Limits for tasks assessed                                 General Comments: Pt pleasant and willing to participate in therapy. Cues for safety and to slow pace with transfers and mobility.       Exercises Other Exercises Other Exercises: supine LE exercises: SLR, Hip abd/add, heel slides x 10 reps each    General Comments        Pertinent Vitals/Pain Pain Assessment: No/denies pain    Home Living                      Prior Function            PT Goals (current goals can now be found in the care plan section) Acute Rehab PT Goals Patient Stated Goal: to go home PT Goal Formulation:  With patient/family Time For Goal Achievement: 01/04/20 Potential to Achieve Goals: Good Progress towards PT goals: Progressing toward goals    Frequency    Min 3X/week      PT Plan Current plan remains appropriate    Co-evaluation              AM-PAC PT "6 Clicks" Mobility   Outcome Measure  Help needed turning from your back to your side while in a flat bed without using bedrails?: None Help needed moving from lying on your back to sitting on the side of a flat bed without using bedrails?: None Help needed moving to and from a bed to a chair (including a wheelchair)?: A Little Help  needed standing up from a chair using your arms (e.g., wheelchair or bedside chair)?: A Little Help needed to walk in hospital room?: A Little Help needed climbing 3-5 steps with a railing? : A Lot 6 Click Score: 19    End of Session Equipment Utilized During Treatment: Gait belt Activity Tolerance: Patient limited by fatigue;Patient tolerated treatment well Patient left: in bed;with call bell/phone within reach;with family/visitor present Nurse Communication: Mobility status PT Visit Diagnosis: Difficulty in walking, not elsewhere classified (R26.2);Muscle weakness (generalized) (M62.81)     Time: 1305-1330 PT Time Calculation (min) (ACUTE ONLY): 25 min  Charges:  $Gait Training: 8-22 mins $Therapeutic Exercise: 8-22 mins                     Bryanna Yim, PT, GCS 12/26/19,1:44 PM

## 2019-12-26 NOTE — Progress Notes (Signed)
Patient places self on CPAP.  Vitals stable patient resting comfortably.

## 2019-12-26 NOTE — Care Management Important Message (Signed)
Important Message  Patient Details  Name: Larry [name] MRN: [id number] Date of Birth: 05-22-1941   Medicare Important Message Given:  Yes     Shelda Altes 12/26/2019, 1:29 PM

## 2019-12-26 NOTE — Progress Notes (Signed)
Subjective: Asked to see patient again for consideration of endoscopic evaluation of anemia and GI bleeding.  Patient has atrial septal defect, and cardiologists would like to plan repair of this, which will require post-procedural anticoagulant therapy.   Patient had initially presented with anemia in the setting of rectal bleeding. We saw patient last week, and he declined any GI work-up; we've been asked to see patient again.  Of late, he has not seen any rectal bleeding or black stools.  Hgb unchanged for several days.  He denies hematemesis and abdominal pain.  He endorses recent decreased appetite but does not believe he has had any unintentional weight loss.  He denies prior episodes of bleeding.  He has never had a colonoscopy or upper endoscopy.  Objective: Vital signs in last 24 hours: Temp:  [97.4 F (36.3 C)-98 F (36.7 C)] 98 F (36.7 C) (03/19 0831) Pulse Rate:  [65-89] 74 (03/19 1337) Resp:  [16-19] 17 (03/19 1337) BP: (86-96)/(61-78) 96/78 (03/19 1337) SpO2:  [98 %-100 %] 98 % (03/19 1337) Weight:  [82.2 kg] 82.2 kg (03/19 0553) Weight change:  Last BM Date: 12/24/19  PE: GEN:  NAD ABD:  Soft, non-tender, non-distended HEENT:  Anicteric, no encephalopathy.  Lab Results: CBC    Component Value Date/Time   WBC 12.2 (H) 12/26/2019 0311   RBC 3.44 (L) 12/26/2019 0311   HGB 9.3 (L) 12/26/2019 0311   HGB 16.6 12/01/2016 1114   HCT 30.2 (L) 12/26/2019 0311   HCT 48.1 12/01/2016 1114   PLT 330 12/26/2019 0311   PLT 147 (L) 12/01/2016 1114   MCV 87.8 12/26/2019 0311   MCV 93 12/01/2016 1114   MCH 27.0 12/26/2019 0311   MCHC 30.8 12/26/2019 0311   RDW 21.4 (H) 12/26/2019 0311   RDW 14.8 12/01/2016 1114   LYMPHSABS 0.9 12/24/2019 0535   LYMPHSABS 2.1 12/01/2016 1114   MONOABS 1.8 (H) 12/24/2019 0535   EOSABS 0.2 12/24/2019 0535   EOSABS 0.1 12/01/2016 1114   BASOSABS 0.0 12/24/2019 0535   BASOSABS 0.1 12/01/2016 1114   CMP     Component Value Date/Time   NA  130 (L) 12/26/2019 0311   NA 137 10/28/2018 1145   K 4.6 12/26/2019 0311   CL 97 (L) 12/26/2019 0311   CO2 20 (L) 12/26/2019 0311   GLUCOSE 98 12/26/2019 0311   BUN 44 (H) 12/26/2019 0311   BUN 26 10/28/2018 1145   CREATININE 2.00 (H) 12/26/2019 0311   CALCIUM 8.5 (L) 12/26/2019 0311   PROT 5.8 (L) 12/25/2019 0447   ALBUMIN 2.7 (L) 12/26/2019 0311   AST 17 12/25/2019 0447   ALT 13 12/25/2019 0447   ALKPHOS 134 (H) 12/25/2019 0447   BILITOT 3.1 (H) 12/25/2019 0447   GFRNONAA 31 (L) 12/26/2019 0311   GFRAA 36 (L) 12/26/2019 0311   Assessment:  1.  Blood in stool, resolved. 2.  Anemia, likely acute on chronic, with some blood loss component. 3.  Atrial fibrillation with RVR, resolved. 4.  Acute on chronic renal insufficiency.  Plan:  1.  Will plan for EGD and Colonoscopy Sunday 12/28/19, will start slow bowel prep and clear liquid diet tomorrow. 2.  [facility] will follow.   Landry Dyke 12/26/2019, 3:01 PM   Cell [phone] If no answer or after 5 PM call [phone]

## 2019-12-26 NOTE — Progress Notes (Signed)
PROGRESS NOTE  Larry [name] XBD:[id number] DOB: 04-17-1941   PCP: Patient, No Pcp Per  Patient is from: Home.  DOA: 12/19/2019 LOS: 7  Brief Narrative / Interim history: 66-year male with history of coronary artery disease, CKD stage IIIb, Hypertension, chronic diastolic congestive heart failure, atrial flutter, hyperlipidemia who presents with weakness.  Or the last couple of days, he was too weak to get out of the bed.  He also had diarrhea with dark stools.  He follows with cardiology for his atrial flutter.  On presentation, his stool was found to be Hemoccult positive, had AKI, hemoglobin of 5.8, A. fib with RVR.  Cardiology and GI consulted.  Patient and family refused EGD/colonoscopy.  Hospital course significant for hyponatremia.  Cardiology (advanced HF team) and nephrology following.   [place] on 12/25/2019 revealed mild pulmonary venous hypertension, elevated right atrial pressure and possible ASD.  TEE on 3/18 confirmed atrial septal defect.  Per cardiology, patient agreeable to GI evaluation for GI bleed before addressing ASD closure.  GI consulted.  Subjective: Seen and evaluated earlier this morning.  No major events overnight or this morning.  No complaints.  He denies chest pain, dyspnea, palpitation, dizziness, GI or UTI symptoms.  Has not had a bowel movement since yesterday.  Objective: Vitals:   12/25/19 1922 12/26/19 0553 12/26/19 0831 12/26/19 1337  BP: (!) 88/63 (!) 88/61 (!) 86/63 96/78  Pulse: 65 72 76 74  Resp: 19 19 18 17   Temp: (!) 97.4 F (36.3 C) 97.6 F (36.4 C) 98 F (36.7 C)   TempSrc: Oral Oral Oral   SpO2: 100% 99% 98% 98%  Weight:  82.2 kg    Height:        Intake/Output Summary (Last 24 hours) at 12/26/2019 1545 Last data filed at 12/26/2019 1329 Gross per 24 hour  Intake 250.81 ml  Output 1580 ml  Net -1329.19 ml   Filed Weights   12/23/19 0401 12/24/19 0546 12/26/19 0553  Weight: 81.2 kg 82.5 kg 82.2 kg    Examination:  GENERAL: No  acute distress.  Appears well.  HEENT: MMM.  Vision and hearing grossly intact.  NECK: Supple.  No apparent JVD.  RESP:  No IWOB. Good air movement bilaterally. CVS:  RRR. Heart sounds normal.  ABD/GI/GU: Bowel sounds present. Soft. Non tender.  MSK/EXT:  Moves extremities. No apparent deformity. No edema.  SKIN: no apparent skin lesion or wound NEURO: Awake, alert and oriented appropriately.  No apparent focal neuro deficit. PSYCH: Calm. Normal affect.  Procedures:  [facility] on 12/25/2019 revealed mild pulmonary venous hypertension, elevated right atrial pressure and possible ASD. 3/18-TEE confirmed atrial septal defect but no LA appendage thrombus. Assessment & Plan: Acute blood loss anemia due to upper GI bleed in the setting of supratherapeutic INR: FOBT positive. Baseline Hgb 10-11> 5.8 (admit)>2u> 7.8>1u> 9.0> 9.3.  Anemia panel consistent with iron deficiency. -Received IV Feraheme x1.  Will give additional Feraheme -Plan for EGD and colonoscopy on 3/21 -Continue PPI -Goal Hgb greater than 8.0 given history of CAD  Acute on chronic diastolic CHF/RV failure: Echo with EF of 60 to 65% with D-shaped septum, normal RVSF but dilated IVC.  [place] on 3/18 as above.  On high-dose torsemide at home.  Responding to IV Lasix.  Heart 2.2 L urine output.  He did not receive Lasix in the evening yesterday due to soft blood pressures.  Renal function is stable.  Soft blood pressures. -Advanced HF team managing-increase Lasix to 80 mg twice daily -  On midodrine 5 mg 3 times daily for soft blood pressure.  Atrial septal defect: TEE on 3/18 confirmed ASD with left-to-right flow. -Plan for surgical closure?  After GI evaluation.  A. fib with RVR: Seems to be in NSR on amiodarone.  Previously on Tikosyn that was stopped due to QTC.  -Continue amiodarone per cardiology -Xarelto discontinued in the setting of GI bleed  CAD s/p PCI to RCA with stent thrombosis in 2000.  No chest pain. -Off Xarelto and ASA  due to GI bleed -Continue statin -Transfusion for Hgb less than 8.0  AKI on CKD stage IIIb/azotemia: Baseline Cr 1.5-1.8 > 2.2 (admit) > 2.05 > 2.21> 2.0.  Likely cardiorenal.  Also on high-dose torsemide which may contribute. -On IV Lasix as above -Cardiology and nephrology following.  Hypervolemic hyponatremia:  In the setting of CHF and CKD.  Baseline Na~130 > 129 (admit) >> 123 > tolvaptan > 129 -Diuretics and tolvaptan per cardiology  Hypotension: Soft blood pressures. -Started on midodrine by cardiology.  Hyperlipidemia:  -On Pravachol  Leukocytosis: Most likely reactive.  No indication for antibiotic therapy for now.  Continue to monitor.  Generalized weakness/debility:  -PT/OT-HH                  DVT prophylaxis: SCD in the setting of GI bleed Code Status: Full code Family Communication: Patient and/or RN.  Updated patient's wife and daughter at bedside on 3/18  Discharge barrier: Evaluation and treatment of acute on chronic CHF requiring IV diuretics, GI bleed and atrial septal defect Patient is from: Home Final disposition: Likely home home likely in 4 to 5 days.  Consultants: nephrology (off), advanced HF team, GI   Microbiology summarized: COVID-19 negative  Sch Meds:  Scheduled Meds: . amiodarone  400 mg Oral BID  . furosemide  80 mg Intravenous BID  . midodrine  5 mg Oral TID WC  . pantoprazole (PROTONIX) IV  40 mg Intravenous Q12H  . [START ON 12/27/2019] polyethylene glycol-electrolytes  4,000 mL Oral Once  . pravastatin  40 mg Oral QPM  . sodium chloride flush  3 mL Intravenous Q12H   Continuous Infusions: . sodium chloride 250 mL (12/25/19 1829)   PRN Meds:.sodium chloride, acetaminophen **OR** acetaminophen, ipratropium-albuterol, ondansetron **OR** ondansetron (ZOFRAN) IV  Antimicrobials: Anti-infectives (From admission, onward)   None       I have personally reviewed the following labs and images: CBC: Recent Labs  Lab  12/21/19 0453 12/21/19 0453 12/22/19 0421 12/22/19 0421 12/23/19 0308 12/23/19 0308 12/24/19 0535 12/24/19 1835 12/25/19 0447 12/25/19 0092 12/25/19 3300 12/25/19 7622 12/25/19 0843 12/25/19 0845 12/26/19 0311  WBC 21.6*   < > 20.2*  --  16.6*  --  13.8*  --  14.4*  --   --   --   --   --  12.2*  NEUTROABS 17.9*  --  16.3*  --  13.1*  --  10.8*  --   --   --   --   --   --   --   --   HGB 7.5*   < > 7.7*   < > 7.6*   < > 7.8*   < > 9.2*   < > 9.9* 9.9* 9.9* 9.9* 9.3*  HCT 24.0*   < > 25.8*   < > 24.2*   < > 25.6*   < > 29.9*   < > 29.0* 29.0* 29.0* 29.0* 30.2*  MCV 83.6   < > 85.1  --  82.0  --  83.4  --  86.2  --   --   --   --   --  87.8  PLT 279   < > 285  --  319  --  364  --  359  --   --   --   --   --  330   < > = values in this interval not displayed.   BMP &GFR Recent Labs  Lab 12/22/19 0421 12/22/19 0421 12/23/19 0308 12/23/19 0308 12/24/19 0535 12/24/19 0535 12/25/19 4818 12/25/19 5631 12/25/19 4970 12/25/19 2637 12/25/19 0843 12/25/19 0845 12/26/19 0311  NA 123*   < > 124*   < > 128*   < > 129*   < > 129* 129* 129* 130* 130*  K 4.1   < > 5.0   < > 4.1   < > 4.5   < > 4.2 4.3 4.2 4.2 4.6  CL 92*  --  94*  --  94*  --  95*  --   --   --   --   --  97*  CO2 17*  --  18*  --  20*  --  20*  --   --   --   --   --  20*  GLUCOSE 126*  --  113*  --  107*  --  117*  --   --   --   --   --  98  BUN 59*  --  62*  --  57*  --  51*  --   --   --   --   --  44*  CREATININE 2.12*  --  2.18*  --  2.21*  --  2.17*  --   --   --   --   --  2.00*  CALCIUM 8.0*  --  8.1*  --  8.3*  --  8.3*  --   --   --   --   --  8.5*  MG  --   --   --   --   --   --  2.7*  --   --   --   --   --  2.8*  PHOS  --   --   --   --   --   --   --   --   --   --   --   --  3.9   < > = values in this interval not displayed.   Estimated Creatinine Clearance: 34.5 mL/min (A) (by C-G formula based on SCr of 2 mg/dL (H)). Liver & Pancreas: Recent Labs  Lab 12/19/19 1500 12/25/19 0447  12/26/19 0311  AST 19 17  --   ALT 13 13  --   ALKPHOS 101 134*  --   BILITOT 2.1* 3.1*  --   PROT 6.1* 5.8*  --   ALBUMIN 3.0* 2.7* 2.7*   No results for input(s): LIPASE, AMYLASE in the last 168 hours. No results for input(s): AMMONIA in the last 168 hours. Diabetic: No results for input(s): HGBA1C in the last 72 hours. No results for input(s): GLUCAP in the last 168 hours. Cardiac Enzymes: No results for input(s): CKTOTAL, CKMB, CKMBINDEX, TROPONINI in the last 168 hours. No results for input(s): PROBNP in the last 8760 hours. Coagulation Profile: Recent Labs  Lab 12/20/19 0747 12/21/19 0453  INR 2.4* 1.9*   Thyroid Function Tests: No results for input(s): TSH, T4TOTAL, FREET4, T3FREE, THYROIDAB in the  last 72 hours. Lipid Profile: No results for input(s): CHOL, HDL, LDLCALC, TRIG, CHOLHDL, LDLDIRECT in the last 72 hours. Anemia Panel: No results for input(s): VITAMINB12, FOLATE, FERRITIN, TIBC, IRON, RETICCTPCT in the last 72 hours. Urine analysis:    Component Value Date/Time   COLORURINE YELLOW 03/23/2018 [place] 03/23/2018 0727   LABSPEC 1.009 03/23/2018 0727   PHURINE 5.0 03/23/2018 0727   GLUCOSEU NEGATIVE 03/23/2018 0727   HGBUR NEGATIVE 03/23/2018 0727   BILIRUBINUR NEGATIVE 03/23/2018 0727   KETONESUR NEGATIVE 03/23/2018 0727   PROTEINUR NEGATIVE 03/23/2018 0727   NITRITE NEGATIVE 03/23/2018 0727   LEUKOCYTESUR NEGATIVE 03/23/2018 0727   Sepsis Labs: Invalid input(s): PROCALCITONIN, [place]  Microbiology: Recent Results (from the past 240 hour(s))  SARS CORONAVIRUS 2 (TAT 6-24 HRS) Nasopharyngeal Nasopharyngeal Swab     Status: None   Collection Time: 12/19/19  1:53 PM   Specimen: Nasopharyngeal Swab  Result Value Ref Range Status   SARS Coronavirus 2 NEGATIVE NEGATIVE Final    Comment: (NOTE) SARS-CoV-2 target nucleic acids are NOT DETECTED. The SARS-CoV-2 RNA is generally detectable in upper and lower respiratory specimens  during the acute phase of infection. Negative results do not preclude SARS-CoV-2 infection, do not rule out co-infections with other pathogens, and should not be used as the sole basis for treatment or other patient management decisions. Negative results must be combined with clinical observations, patient history, and epidemiological information. The expected result is Negative. Fact Sheet for Patients: SugarRoll.be Fact Sheet for Healthcare Providers: https://www.woods-mathews.com/ This test is not yet approved or cleared by the [place] FDA and  has been authorized for detection and/or diagnosis of SARS-CoV-2 by FDA under an Emergency Use Authorization (EUA). This EUA will remain  in effect (meaning this test can be used) for the duration of the COVID-19 declaration under Section 56 4(b)(1) of the Act, 21 U.S.C. section 360bbb-3(b)(1), unless the authorization is terminated or revoked sooner. Performed at [facility], [place] [address] [place], [place] [address]     Radiology Studies: No results found.   Marrion Finan T. [place]  If 7PM-7AM, please contact night-coverage [email or web address] Password [facility] 12/26/2019, 3:45 PM

## 2019-12-26 NOTE — Progress Notes (Signed)
OT Cancellation Note  Patient Details Name: Larry [name] MRN: [id number] DOB: 11-02-1940   Cancelled Treatment:    Reason Eval/Treat Not Completed: Patient declined, no reason specified.  Patient refused therapy this afternoon.  Family present in room and saying he had already completed therapy and there was no need.  Patient stating he was too worn out and declined to participate.    August Luz, OTR/L   Phylliss Bob 12/26/2019, 3:33 PM

## 2019-12-26 NOTE — Progress Notes (Signed)
Patient ID: Larry [name], male   DOB: [birth year], 79 y.o.   MRN: [id number]      Advanced Heart Failure Rounding Note  PCP-Cardiologist: No primary care provider on file.   Subjective:    Admitted with GI bleed. Received 3 U PRBCs total and IV Fe. Refused scopes initially.  Hgb 7.6 => 7.8 => 9.2 => 9.3.  No stool. With need for ASD closure, he is now willing to undergo GI workup.   Refused evening Lasix but I/Os still negative.    He is back in atrial flutter this morning, rate controlled in 80s.   Breathing better and less fatigued.   RHC Procedural Findings: Hemodynamics (mmHg) RA mean 14 RV 38/11 PA 37/17, mean 25 PCWP mean 15 Oxygen saturations: SVC 54% High RA 60% Low RA 88% PA 88% PV 100% AO 100% Cardiac Output (Fick) 13.6  Cardiac Index (Fick) 6.64 PVR 0.74 WU Shunt fraction 3.7  TEE: Normal LV size and wall thickness.  EF 60-65%, no wall motion abnormalities.  Moderately dilated RV with normal systolic function.  D-shaped interventricular septum suggestive of RV pressure/volume overload.  Moderate left atrial enlargement, no LA appendage thrombus.  Moderate right atrial enlargement.  There is a prominent Eustachian valve.  There is a large secundum ASD with left to right flow, 2.9  X 1.1 cm.  There do appear to be adequate rims for closure. Trivial TR. Mild mitral regurgitation.  Trileaflet aortic valve with no stenosis, trivial regurgitation.   Objective:   Weight Range: 82.2 kg Body mass index is 24.58 kg/m.   Vital Signs:   Temp:  [96.5 F (35.8 C)-98 F (36.7 C)] 98 F (36.7 C) (03/19 0831) Pulse Rate:  [47-90] 76 (03/19 0831) Resp:  [11-25] 18 (03/19 0831) BP: (82-104)/(24-86) 86/63 (03/19 0831) SpO2:  [89 %-100 %] 98 % (03/19 0831) Weight:  [82.2 kg] 82.2 kg (03/19 0553) Last BM Date: 12/24/19  Weight change: Filed Weights   12/23/19 0401 12/24/19 0546 12/26/19 0553  Weight: 81.2 kg 82.5 kg 82.2 kg    Intake/Output:   Intake/Output Summary  (Last 24 hours) at 12/26/2019 0852 Last data filed at 12/26/2019 4315 Gross per 24 hour  Intake 370.81 ml  Output 2160 ml  Net -1789.19 ml      Physical Exam    General: NAD Neck: JVP 10-12 cm, no thyromegaly or thyroid nodule.  Lungs: Clear to auscultation bilaterally with normal respiratory effort. CV: Nondisplaced PMI.  Heart irregular S1/S2, no S3/S4, 1/6 SEM RUSB.  1+ ankle edema.   Abdomen: Soft, nontender, no hepatosplenomegaly, no distention.  Skin: Intact without lesions or rashes.  Neurologic: Alert and oriented x 3.  Psych: Normal affect. Extremities: No clubbing or cyanosis.  HEENT: Normal.    Telemetry   Atrial flutter 70-80s (personally reviewed)  EKG    n/a  Labs    CBC Recent Labs    12/24/19 0535 12/24/19 1835 12/25/19 0447 12/25/19 0811 12/25/19 0845 12/26/19 0311  WBC 13.8*  --  14.4*  --   --  12.2*  NEUTROABS 10.8*  --   --   --   --   --   HGB 7.8*   < > 9.2*   < > 9.9* 9.3*  HCT 25.6*   < > 29.9*   < > 29.0* 30.2*  MCV 83.4  --  86.2  --   --  87.8  PLT 364  --  359  --   --  330   < > =  values in this interval not displayed.   Basic Metabolic Panel Recent Labs    12/25/19 0447 12/25/19 0811 12/25/19 0845 12/26/19 0311  NA 129*   < > 130* 130*  K 4.5   < > 4.2 4.6  CL 95*  --   --  97*  CO2 20*  --   --  20*  GLUCOSE 117*  --   --  98  BUN 51*  --   --  44*  CREATININE 2.17*  --   --  2.00*  CALCIUM 8.3*  --   --  8.5*  MG 2.7*  --   --  2.8*  PHOS  --   --   --  3.9   < > = values in this interval not displayed.   Liver Function Tests Recent Labs    12/25/19 0447 12/26/19 0311  AST 17  --   ALT 13  --   ALKPHOS 134*  --   BILITOT 3.1*  --   PROT 5.8*  --   ALBUMIN 2.7* 2.7*   No results for input(s): LIPASE, AMYLASE in the last 72 hours. Cardiac Enzymes No results for input(s): CKTOTAL, CKMB, CKMBINDEX, TROPONINI in the last 72 hours.  BNP: BNP (last 3 results) Recent Labs    05/12/19 1200 12/19/19 1404    BNP 469.5* 192.2*    ProBNP (last 3 results) No results for input(s): PROBNP in the last 8760 hours.   D-Dimer No results for input(s): DDIMER in the last 72 hours. Hemoglobin A1C No results for input(s): HGBA1C in the last 72 hours. Fasting Lipid Panel No results for input(s): CHOL, HDL, LDLCALC, TRIG, CHOLHDL, LDLDIRECT in the last 72 hours. Thyroid Function Tests No results for input(s): TSH, T4TOTAL, T3FREE, THYROIDAB in the last 72 hours.  Invalid input(s): FREET3  Other results:   Imaging    No results found.   Medications:     Scheduled Medications: . amiodarone  400 mg Oral BID  . furosemide  80 mg Intravenous BID  . midodrine  5 mg Oral TID WC  . pantoprazole (PROTONIX) IV  40 mg Intravenous Q12H  . polyethylene glycol  17 g Oral Daily  . pravastatin  40 mg Oral QPM  . sodium chloride flush  3 mL Intravenous Q12H    Infusions: . sodium chloride 250 mL (12/25/19 1829)    PRN Medications: sodium chloride, acetaminophen **OR** acetaminophen, ipratropium-albuterol, ondansetron **OR** ondansetron (ZOFRAN) IV     Assessment/Plan   1. Acute on chronic diastolic CHF: With significant component of RV failure.  Echo this admission showed EF 60-65% with D-shaped septum, mild-moderate RV dilation and normal RV systolic function, IVC dilated. On high dose torsemide at home. Refused evening Lasix yesterday but still negative I/Os.  Creatinine 2.12 => 2.18 => 2.21 => 2.17 => 2.  RHC suggestive of ASD with high shunt fraction and ASD confirms large secundum ASD.  Exam and RHC suggest ongoing right-sided CHF.  - Will give Lasix 80 mg IV bid for at least 1 more day.  No tolvaptan today, Na up to 130.  - He is on midodrine 5 mg tid with soft BP.   2. CAD: PCI to RCA, had stent thrombosis back in 2000.No CP. - He has not been on ASA given Xarelto use. Would hold for now with GI bleeding.   - Continue pravastatin,good lipids in 9/20. 3. Atrial  fibrillation/flutter: Paroxysmal, had been in NSR earlier in his admission but now in atrial flutter  with controlled rate.  He had to stop Tikosyn due to long QTc on lowest dose Tikosyn. He is now off Xarelto with GI bleeding.  Large ASD is likely driving the atrial fibrillation/flutter.  - Continue amiodarone po.     - When anticoagulation restarted, would use apixaban as this seems to have somewhat less GI bleeding risk.  - Will not be able to cardiovert without anticoagulation but rate controlled currently.  - He is now agreeable to GI workup (see below).  4. GI bleeding/microcytic anemia: Fe deficiency. Hgb down to 7.6 in 8/20.  At the time, I recommended GI workup but he refused.  This admission, hgb markedly low and stool is heme+. He has had 3 units PRBCs.  He initially refused inpatient GI workup, says he was "too weak."  He got IV Fe.  He is now willing to have GI workup, would like this done prior to ASD closure as he will need ASA/Plavix and also has a need for anticoagulation with atrial fibrillation.  Will need to resume some form of anticoagulation though will try to minimize.  - GI consulted yesterday, have not seen yet.  Hopefully can get scopes done tomorrow??.  5. Hyponatremia: Hypervolemic hyponatremia.  Na higher at 130 today.   - Fluid restrict.  6. AKI on CKD stage 3: In setting of GI bleeding and anemia.  However, now appears volume overloaded.  BUN/creatinine slowly trending down.  7. ASD: Large, hemodynamically significant secundum ASD by RHC and TEE.  PA pressure only mildly elevated.  He will need eventual closure but need to get GI bleeding explored.  - Have asked Dr. Burt Knack to see him for ASD closure.  - Needs GI workup, now agreeable.   [name] 12/26/2019 8:52 AM

## 2019-12-27 LAB — RENAL FUNCTION PANEL
Albumin: 2.8 g/dL — ABNORMAL LOW (ref 3.5–5.0)
Anion gap: 14 (ref 5–15)
BUN: 38 mg/dL — ABNORMAL HIGH (ref 8–23)
CO2: 20 mmol/L — ABNORMAL LOW (ref 22–32)
Calcium: 8.5 mg/dL — ABNORMAL LOW (ref 8.9–10.3)
Chloride: 97 mmol/L — ABNORMAL LOW (ref 98–111)
Creatinine, Ser: 2.19 mg/dL — ABNORMAL HIGH (ref 0.61–1.24)
GFR calc Af Amer: 33 mL/min — ABNORMAL LOW (ref >60)
GFR calc non Af Amer: 28 mL/min — ABNORMAL LOW (ref >60)
Glucose, Bld: 145 mg/dL — ABNORMAL HIGH (ref 70–99)
Phosphorus: 4.4 mg/dL (ref 2.5–4.6)
Potassium: 4.4 mmol/L (ref 3.5–5.1)
Sodium: 131 mmol/L — ABNORMAL LOW (ref 135–145)

## 2019-12-27 LAB — CBC
HCT: 30.6 % — ABNORMAL LOW (ref 39.0–52.0)
Hemoglobin: 9.2 g/dL — ABNORMAL LOW (ref 13.0–17.0)
MCH: 26.8 pg (ref 26.0–34.0)
MCHC: 30.1 g/dL (ref 30.0–36.0)
MCV: 89.2 fL (ref 80.0–100.0)
Platelets: 328 10*3/uL (ref 150–400)
RBC: 3.43 MIL/uL — ABNORMAL LOW (ref 4.22–5.81)
RDW: 22.5 % — ABNORMAL HIGH (ref 11.5–15.5)
WBC: 14.5 10*3/uL — ABNORMAL HIGH (ref 4.0–10.5)
nRBC: 0.3 % — ABNORMAL HIGH (ref 0.0–0.2)

## 2019-12-27 LAB — MAGNESIUM: Magnesium: 2.6 mg/dL — ABNORMAL HIGH (ref 1.7–2.4)

## 2019-12-27 MED ORDER — FUROSEMIDE 10 MG/ML IJ SOLN
80.0000 mg | Freq: Once | INTRAMUSCULAR | Status: AC
  Administered 2019-12-27: 80 mg via INTRAVENOUS
  Filled 2019-12-27: qty 8

## 2019-12-27 NOTE — Progress Notes (Signed)
Subjective: Couple bowel movements yesterday without blood in stool. No abdominal pain.  Objective: Vital signs in last 24 hours: Temp:  [97.8 F (36.6 C)] 97.8 F (36.6 C) (03/20 0447) Pulse Rate:  [72-78] 78 (03/20 0816) Resp:  [17-20] 18 (03/20 0447) BP: (84-96)/(55-78) 96/60 (03/20 0816) SpO2:  [98 %-100 %] 100 % (03/20 0816) FiO2 (%):  [21 %] 21 % (03/19 2300) Weight:  [82.1 kg] 82.1 kg (03/20 0447) Weight change: -0.091 kg Last BM Date: 12/26/19  PE: GEN:  NAD  Lab Results: CBC    Component Value Date/Time   WBC 14.5 (H) 12/27/2019 0415   RBC 3.43 (L) 12/27/2019 0415   HGB 9.2 (L) 12/27/2019 0415   HGB 16.6 12/01/2016 1114   HCT 30.6 (L) 12/27/2019 0415   HCT 48.1 12/01/2016 1114   PLT 328 12/27/2019 0415   PLT 147 (L) 12/01/2016 1114   MCV 89.2 12/27/2019 0415   MCV 93 12/01/2016 1114   MCH 26.8 12/27/2019 0415   MCHC 30.1 12/27/2019 0415   RDW 22.5 (H) 12/27/2019 0415   RDW 14.8 12/01/2016 1114   LYMPHSABS 0.9 12/24/2019 0535   LYMPHSABS 2.1 12/01/2016 1114   MONOABS 1.8 (H) 12/24/2019 0535   EOSABS 0.2 12/24/2019 0535   EOSABS 0.1 12/01/2016 1114   BASOSABS 0.0 12/24/2019 0535   BASOSABS 0.1 12/01/2016 1114   CMP     Component Value Date/Time   NA 131 (L) 12/27/2019 0415   NA 137 10/28/2018 1145   K 4.4 12/27/2019 0415   CL 97 (L) 12/27/2019 0415   CO2 20 (L) 12/27/2019 0415   GLUCOSE 145 (H) 12/27/2019 0415   BUN 38 (H) 12/27/2019 0415   BUN 26 10/28/2018 1145   CREATININE 2.19 (H) 12/27/2019 0415   CALCIUM 8.5 (L) 12/27/2019 0415   PROT 5.8 (L) 12/25/2019 0447   ALBUMIN 2.8 (L) 12/27/2019 0415   AST 17 12/25/2019 0447   ALT 13 12/25/2019 0447   ALKPHOS 134 (H) 12/25/2019 0447   BILITOT 3.1 (H) 12/25/2019 0447   GFRNONAA 28 (L) 12/27/2019 0415   GFRAA 33 (L) 12/27/2019 0415   Assessment:  1.  Blood in stool, resolved. 2.  Anemia, likely acute on chronic, with some blood loss component. 3.  Atrial fibrillation with RVR, resolved. 4.   Acute on chronic renal insufficiency. 5.  ASD, in need of repair, endoscopic evaluation of anemia needed beforehand.  Plan:  1.  EGD/Colonoscopy scheduled tomorrow morning. 2.  Anticoagulation on hold. 3.  CBCs, transfuse if needed. 4.  [facility] will follow.   Landry Dyke 12/27/2019, 10:30 AM   Cell [phone] If no answer or after 5 PM call [phone]

## 2019-12-27 NOTE — Plan of Care (Signed)
  Problem: Elimination: Goal: Will not experience complications related to bowel motility Outcome: Completed/Met Goal: Will not experience complications related to urinary retention Outcome: Completed/Met   Problem: Pain Managment: Goal: General experience of comfort will improve Outcome: Completed/Met   Problem: Safety: Goal: Ability to remain free from injury will improve Outcome: Completed/Met   

## 2019-12-27 NOTE — Progress Notes (Signed)
Orthopedic Tech Progress Note Patient Details:  Larry [name] [birth year] [id number]  Ortho Devices Type of Ortho Device: Louretta Parma boot Ortho Device/Splint Interventions: Application   Post Interventions Patient Tolerated: Well   [name] 12/27/2019, 12:24 PM

## 2019-12-27 NOTE — Progress Notes (Signed)
Patient refused to wear CPAP tonight, he stated he just did not feel like wearing it.

## 2019-12-27 NOTE — Progress Notes (Signed)
PROGRESS NOTE  Larry [name] TMH:[id number] DOB: July 25, 1941   PCP: Patient, No Pcp Per  Patient is from: Home.  DOA: 12/19/2019 LOS: 8  Brief Narrative / Interim history: 75-year male with history of coronary artery disease, CKD stage IIIb, Hypertension, chronic diastolic congestive heart failure, atrial flutter, hyperlipidemia who presents with weakness.  Or the last couple of days, he was too weak to get out of the bed.  He also had diarrhea with dark stools.  He follows with cardiology for his atrial flutter.  On presentation, his stool was found to be Hemoccult positive, had AKI, hemoglobin of 5.8, A. fib with RVR.  Cardiology and GI consulted.  Patient and family refused EGD/colonoscopy.  Hospital course significant for hyponatremia.  Cardiology (advanced HF team) and nephrology following.   [place] on 12/25/2019 revealed mild pulmonary venous hypertension, elevated right atrial pressure and possible ASD.  TEE on 3/18 confirmed atrial septal defect.  Per cardiology, patient agreeable to GI evaluation for GI bleed before addressing ASD closure.  GI consulted.  Subjective: Seen and examined earlier this morning.  No major events overnight or this morning.  No complaints.  He is anxious about the bowel prep for colonoscopy.  Patient and family asking if patient's son can stay with him overnight to help while working on the bowel prep.  Objective: Vitals:   12/26/19 2007 12/26/19 2300 12/27/19 0447 12/27/19 0816  BP: 95/67  (!) 84/55 96/60  Pulse: 72  76 78  Resp: 20  18   Temp:   97.8 F (36.6 C)   TempSrc:   Oral   SpO2: 100% 100% 98% 100%  Weight:   82.1 kg   Height:        Intake/Output Summary (Last 24 hours) at 12/27/2019 1453 Last data filed at 12/27/2019 1200 Gross per 24 hour  Intake 120 ml  Output 1675 ml  Net -1555 ml   Filed Weights   12/24/19 0546 12/26/19 0553 12/27/19 0447  Weight: 82.5 kg 82.2 kg 82.1 kg    Examination:  GENERAL: No acute distress.  Appears  well.  HEENT: MMM.  Vision and hearing grossly intact.  NECK: Supple.  No apparent JVD.  RESP:  No IWOB. Good air movement bilaterally. CVS:  RRR. Heart sounds normal.  ABD/GI/GU: Bowel sounds present. Soft. Non tender.  MSK/EXT:  Moves extremities. No apparent deformity. No edema.  SKIN: no apparent skin lesion or wound NEURO: Awake, alert and oriented appropriately.  No apparent focal neuro deficit. PSYCH: Calm. Normal affect.   Procedures:  3/18-RHC-mild pulmonary venous hypertension, elevated right atrial pressure and possible ASD. 3/18-TEE confirmed atrial septal defect but no LA appendage thrombus.  Assessment & Plan: Acute blood loss anemia due to upper GI bleed in the setting of supratherapeutic INR: FOBT positive. Baseline Hgb 10-11> 5.8 (admit)>2u> 7.8>1u> 9.0> 9.2.  Anemia panel consistent with iron deficiency. -Received IV Feraheme x1.  Will give additional Feraheme -Plan for EGD and colonoscopy on 3/21 -Continue PPI.  N.p.o. after midnight -Goal Hgb greater than 8.0 given history of CAD  Acute on chronic diastolic CHF/RV failure: Echo with EF of 60 to 65% with D-shaped septum, normal RVSF but dilated IVC.  [place] on 3/18 as above.  On high-dose torsemide at home.  Responding to IV Lasix.  About 1.2 L urine output in the last 24 hours.  Creatinine relatively stable.  Soft blood pressures. -Advanced HF team managing-increase Lasix to 80 mg twice daily -On midodrine 5 mg 3 times daily for  soft blood pressure.  Atrial septal defect: TEE on 3/18 confirmed ASD with left-to-right flow. -Plan for surgical closure?  After GI evaluation.  A. fib with RVR: Seems to be in NSR on amiodarone.  Previously on Tikosyn that was stopped due to QTC.  -Continue amiodarone per cardiology -Xarelto discontinued in the setting of GI bleed-plan to resume with Eliquis when able  CAD s/p PCI to RCA with stent thrombosis in 2000.  No chest pain. -Off Xarelto and ASA due to GI bleed -Continue  statin -Transfusion for Hgb less than 8.0  AKI on CKD stage IIIb/azotemia: Baseline Cr 1.5-1.8 > 2.2 (admit) > 2.05 > 2.21> 2.19.  Likely cardiorenal.  Also on high-dose torsemide which may contribute. -On IV Lasix as above -Cardiology and nephrology following.  Hypervolemic hyponatremia:  In the setting of CHF and CKD.  Baseline Na~130 > 129 (admit) >> 123 > tolvaptan > 129>131 -Diuretics and fluid restriction  Hypotension: Soft blood pressures. -Started on midodrine by cardiology.  Hyperlipidemia:  -On Pravachol  Leukocytosis: Likely reactive.  No obvious signs of infection. -Continue monitoring  Generalized weakness/debility:  -PT/OT-HH                  DVT prophylaxis: SCD in the setting of GI bleed Code Status: Full code Family Communication: Patient and/or RN.  Updated patient's wife and daughter at bedside  Discharge barrier: Evaluation and treatment of acute on chronic CHF requiring IV diuretics, GI bleed and atrial septal defect Patient is from: Home Final disposition: Likely home home likely in 4 to 5 days when stable and cleared by consultants  Consultants: nephrology (off), advanced HF team, GI   Microbiology summarized: COVID-19 negative  Sch Meds:  Scheduled Meds: . amiodarone  400 mg Oral BID  . furosemide  80 mg Intravenous Once  . midodrine  5 mg Oral TID WC  . pantoprazole (PROTONIX) IV  40 mg Intravenous Q12H  . polyethylene glycol-electrolytes  2,000 mL Oral Once  . [START ON 12/28/2019] polyethylene glycol-electrolytes  2,000 mL Oral Once  . pravastatin  40 mg Oral QPM  . sodium chloride flush  3 mL Intravenous Q12H   Continuous Infusions: . sodium chloride 250 mL (12/25/19 1829)   PRN Meds:.sodium chloride, acetaminophen **OR** acetaminophen, ipratropium-albuterol, ondansetron **OR** ondansetron (ZOFRAN) IV  Antimicrobials: Anti-infectives (From admission, onward)   None       I have personally reviewed the following  labs and images: CBC: Recent Labs  Lab 12/21/19 0453 12/21/19 0453 12/22/19 0421 12/22/19 0421 12/23/19 0308 12/23/19 0308 12/24/19 0535 12/24/19 1835 12/25/19 0447 12/25/19 3762 12/25/19 8315 12/25/19 0843 12/25/19 0845 12/26/19 0311 12/27/19 0415  WBC 21.6*   < > 20.2*   < > 16.6*  --  13.8*  --  14.4*  --   --   --   --  12.2* 14.5*  NEUTROABS 17.9*  --  16.3*  --  13.1*  --  10.8*  --   --   --   --   --   --   --   --   HGB 7.5*   < > 7.7*   < > 7.6*   < > 7.8*   < > 9.2*   < > 9.9* 9.9* 9.9* 9.3* 9.2*  HCT 24.0*   < > 25.8*   < > 24.2*   < > 25.6*   < > 29.9*   < > 29.0* 29.0* 29.0* 30.2* 30.6*  MCV 83.6   < > 85.1   < >  82.0  --  83.4  --  86.2  --   --   --   --  87.8 89.2  PLT 279   < > 285   < > 319  --  364  --  359  --   --   --   --  330 328   < > = values in this interval not displayed.   BMP &GFR Recent Labs  Lab 12/23/19 0308 12/23/19 0308 12/24/19 0535 12/24/19 0535 12/25/19 0447 12/25/19 6720 12/25/19 9470 12/25/19 0843 12/25/19 0845 12/26/19 0311 12/27/19 0415  NA 124*   < > 128*   < > 129*   < > 129* 129* 130* 130* 131*  K 5.0   < > 4.1   < > 4.5   < > 4.3 4.2 4.2 4.6 4.4  CL 94*  --  94*  --  95*  --   --   --   --  97* 97*  CO2 18*  --  20*  --  20*  --   --   --   --  20* 20*  GLUCOSE 113*  --  107*  --  117*  --   --   --   --  98 145*  BUN 62*  --  57*  --  51*  --   --   --   --  44* 38*  CREATININE 2.18*  --  2.21*  --  2.17*  --   --   --   --  2.00* 2.19*  CALCIUM 8.1*  --  8.3*  --  8.3*  --   --   --   --  8.5* 8.5*  MG  --   --   --   --  2.7*  --   --   --   --  2.8* 2.6*  PHOS  --   --   --   --   --   --   --   --   --  3.9 4.4   < > = values in this interval not displayed.   Estimated Creatinine Clearance: 31.5 mL/min (A) (by C-G formula based on SCr of 2.19 mg/dL (H)). Liver & Pancreas: Recent Labs  Lab 12/25/19 0447 12/26/19 0311 12/27/19 0415  AST 17  --   --   ALT 13  --   --   ALKPHOS 134*  --   --   BILITOT 3.1*   --   --   PROT 5.8*  --   --   ALBUMIN 2.7* 2.7* 2.8*   No results for input(s): LIPASE, AMYLASE in the last 168 hours. No results for input(s): AMMONIA in the last 168 hours. Diabetic: No results for input(s): HGBA1C in the last 72 hours. No results for input(s): GLUCAP in the last 168 hours. Cardiac Enzymes: No results for input(s): CKTOTAL, CKMB, CKMBINDEX, TROPONINI in the last 168 hours. No results for input(s): PROBNP in the last 8760 hours. Coagulation Profile: Recent Labs  Lab 12/21/19 0453  INR 1.9*   Thyroid Function Tests: No results for input(s): TSH, T4TOTAL, FREET4, T3FREE, THYROIDAB in the last 72 hours. Lipid Profile: No results for input(s): CHOL, HDL, LDLCALC, TRIG, CHOLHDL, LDLDIRECT in the last 72 hours. Anemia Panel: No results for input(s): VITAMINB12, FOLATE, FERRITIN, TIBC, IRON, RETICCTPCT in the last 72 hours. Urine analysis:    Component Value Date/Time   COLORURINE YELLOW 03/23/2018 [place] 03/23/2018 0727  LABSPEC 1.009 03/23/2018 0727   PHURINE 5.0 03/23/2018 0727   GLUCOSEU NEGATIVE 03/23/2018 0727   HGBUR NEGATIVE 03/23/2018 0727   BILIRUBINUR NEGATIVE 03/23/2018 0727   KETONESUR NEGATIVE 03/23/2018 0727   PROTEINUR NEGATIVE 03/23/2018 0727   NITRITE NEGATIVE 03/23/2018 0727   LEUKOCYTESUR NEGATIVE 03/23/2018 0727   Sepsis Labs: Invalid input(s): PROCALCITONIN, [place]  Microbiology: Recent Results (from the past 240 hour(s))  SARS CORONAVIRUS 2 (TAT 6-24 HRS) Nasopharyngeal Nasopharyngeal Swab     Status: None   Collection Time: 12/19/19  1:53 PM   Specimen: Nasopharyngeal Swab  Result Value Ref Range Status   SARS Coronavirus 2 NEGATIVE NEGATIVE Final    Comment: (NOTE) SARS-CoV-2 target nucleic acids are NOT DETECTED. The SARS-CoV-2 RNA is generally detectable in upper and lower respiratory specimens during the acute phase of infection. Negative results do not preclude SARS-CoV-2 infection, do not rule  out co-infections with other pathogens, and should not be used as the sole basis for treatment or other patient management decisions. Negative results must be combined with clinical observations, patient history, and epidemiological information. The expected result is Negative. Fact Sheet for Patients: SugarRoll.be Fact Sheet for Healthcare Providers: https://www.woods-mathews.com/ This test is not yet approved or cleared by the [place] FDA and  has been authorized for detection and/or diagnosis of SARS-CoV-2 by FDA under an Emergency Use Authorization (EUA). This EUA will remain  in effect (meaning this test can be used) for the duration of the COVID-19 declaration under Section 56 4(b)(1) of the Act, 21 U.S.C. section 360bbb-3(b)(1), unless the authorization is terminated or revoked sooner. Performed at [facility], [place] [address], [place], [place] [address]     Radiology Studies: No results found.   Atina Feeley T. [place]  If 7PM-7AM, please contact night-coverage [email or web address] Password [facility] 12/27/2019, 2:53 PM

## 2019-12-27 NOTE — Progress Notes (Signed)
PT is recommending HHPT and supervision for mobility/OOB. Met with pt and wife. Discussed HHPT recommendations. The D/C plan is for pt to return home with the support of his wife and family. She reports that she doesn't think he needs HHPT that he walks at home, but she will discuss it with her husband. Encouraged wife to contact CM/SW if they agree with HHPT. Will continue to f/u to assist with the D/C plan.

## 2019-12-27 NOTE — Progress Notes (Signed)
Patient ID: Larry [name], male   DOB: 10/22/1940, 79 y.o.   MRN: [id number]      Advanced Heart Failure Rounding Note  PCP-Cardiologist: No primary care provider on file.   Subjective:    Admitted with GI bleed. Received 3 U PRBCs total and IV Fe. Refused scopes initially.  Hgb 7.6 => 7.8 => 9.2 => 9.3 => 9.2.  BM this morning, normal color. With need for ASD closure, he is now willing to undergo GI workup.   I/Os negative, weight not changed though.     He remains in atrial flutter this morning, rate controlled in 90s.   Still fatigued walking in hall.   RHC Procedural Findings: Hemodynamics (mmHg) RA mean 14 RV 38/11 PA 37/17, mean 25 PCWP mean 15 Oxygen saturations: SVC 54% High RA 60% Low RA 88% PA 88% PV 100% AO 100% Cardiac Output (Fick) 13.6  Cardiac Index (Fick) 6.64 PVR 0.74 WU Shunt fraction 3.7  TEE: Normal LV size and wall thickness.  EF 60-65%, no wall motion abnormalities.  Moderately dilated RV with normal systolic function.  D-shaped interventricular septum suggestive of RV pressure/volume overload.  Moderate left atrial enlargement, no LA appendage thrombus.  Moderate right atrial enlargement.  There is a prominent Eustachian valve.  There is a large secundum ASD with left to right flow, 2.9  X 1.1 cm.  There do appear to be adequate rims for closure. Trivial TR. Mild mitral regurgitation.  Trileaflet aortic valve with no stenosis, trivial regurgitation.   Objective:   Weight Range: 82.1 kg Body mass index is 24.55 kg/m.   Vital Signs:   Temp:  [97.8 F (36.6 C)] 97.8 F (36.6 C) (03/20 0447) Pulse Rate:  [72-78] 78 (03/20 0816) Resp:  [17-20] 18 (03/20 0447) BP: (84-96)/(55-78) 96/60 (03/20 0816) SpO2:  [98 %-100 %] 100 % (03/20 0816) FiO2 (%):  [21 %] 21 % (03/19 2300) Weight:  [82.1 kg] 82.1 kg (03/20 0447) Last BM Date: 12/26/19  Weight change: Filed Weights   12/24/19 0546 12/26/19 0553 12/27/19 0447  Weight: 82.5 kg 82.2 kg 82.1 kg     Intake/Output:   Intake/Output Summary (Last 24 hours) at 12/27/2019 0926 Last data filed at 12/27/2019 0800 Gross per 24 hour  Intake 120 ml  Output 1495 ml  Net -1375 ml      Physical Exam    General: NAD Neck: JVP 10-12 cm, no thyromegaly or thyroid nodule.  Lungs: Clear to auscultation bilaterally with normal respiratory effort. CV: Nondisplaced PMI.  Heart regular S1/S2, no S3/S4, 2/6 SEM RUSB.  1+ edema to knees.   Abdomen: Soft, nontender, no hepatosplenomegaly, no distention.  Skin: Intact without lesions or rashes.  Neurologic: Alert and oriented x 3.  Psych: Normal affect. Extremities: No clubbing or cyanosis.  HEENT: Normal.    Telemetry   Atrial flutter 90s (personally reviewed)  EKG    n/a  Labs    CBC Recent Labs    12/26/19 0311 12/27/19 0415  WBC 12.2* 14.5*  HGB 9.3* 9.2*  HCT 30.2* 30.6*  MCV 87.8 89.2  PLT 330 182   Basic Metabolic Panel Recent Labs    12/26/19 0311 12/27/19 0415  NA 130* 131*  K 4.6 4.4  CL 97* 97*  CO2 20* 20*  GLUCOSE 98 145*  BUN 44* 38*  CREATININE 2.00* 2.19*  CALCIUM 8.5* 8.5*  MG 2.8* 2.6*  PHOS 3.9 4.4   Liver Function Tests Recent Labs    12/25/19 0447 12/25/19 0447  12/26/19 0311 12/27/19 0415  AST 17  --   --   --   ALT 13  --   --   --   ALKPHOS 134*  --   --   --   BILITOT 3.1*  --   --   --   PROT 5.8*  --   --   --   ALBUMIN 2.7*   < > 2.7* 2.8*   < > = values in this interval not displayed.   No results for input(s): LIPASE, AMYLASE in the last 72 hours. Cardiac Enzymes No results for input(s): CKTOTAL, CKMB, CKMBINDEX, TROPONINI in the last 72 hours.  BNP: BNP (last 3 results) Recent Labs    05/12/19 1200 12/19/19 1404  BNP 469.5* 192.2*    ProBNP (last 3 results) No results for input(s): PROBNP in the last 8760 hours.   D-Dimer No results for input(s): DDIMER in the last 72 hours. Hemoglobin A1C No results for input(s): HGBA1C in the last 72 hours. Fasting Lipid  Panel No results for input(s): CHOL, HDL, LDLCALC, TRIG, CHOLHDL, LDLDIRECT in the last 72 hours. Thyroid Function Tests No results for input(s): TSH, T4TOTAL, T3FREE, THYROIDAB in the last 72 hours.  Invalid input(s): FREET3  Other results:   Imaging    No results found.   Medications:     Scheduled Medications: . amiodarone  400 mg Oral BID  . midodrine  5 mg Oral TID WC  . pantoprazole (PROTONIX) IV  40 mg Intravenous Q12H  . polyethylene glycol-electrolytes  2,000 mL Oral Once  . [START ON 12/28/2019] polyethylene glycol-electrolytes  2,000 mL Oral Once  . pravastatin  40 mg Oral QPM  . sodium chloride flush  3 mL Intravenous Q12H    Infusions: . sodium chloride 250 mL (12/25/19 1829)    PRN Medications: sodium chloride, acetaminophen **OR** acetaminophen, ipratropium-albuterol, ondansetron **OR** ondansetron (ZOFRAN) IV     Assessment/Plan   1. Acute on chronic diastolic CHF: With significant component of RV failure.  Echo this admission showed EF 60-65% with D-shaped septum, mild-moderate RV dilation and normal RV systolic function, IVC dilated. On high dose torsemide at home. I/Os negative, weight unchanged.  Creatinine 2.12 => 2.18 => 2.21 => 2.17 => 2 => 2.19, BUN trending down.  RHC suggestive of ASD with high shunt fraction and ASD confirms large secundum ASD.  Exam and RHC suggest ongoing right-sided CHF.  I suspect we will not get his CVP down into normal range until the ASD is closed, but would like to get a bit more fluid off him. - Will give Lasix 80 mg IV bid today then stop.   - He is on midodrine 5 mg tid with soft BP, generally SBP running 90s.   2. CAD: PCI to RCA, had stent thrombosis back in 2000.No CP. - He has not been on ASA given Xarelto use. Would hold for now with GI bleeding.   - Continue pravastatin,good lipids in 9/20. 3. Atrial fibrillation/flutter: Paroxysmal, had been in NSR earlier in his admission but now in atrial flutter with  controlled rate.  He had to stop Tikosyn due to long QTc on lowest dose Tikosyn. He is now off Xarelto with GI bleeding.  Large ASD is likely driving the atrial fibrillation/flutter.  - Continue amiodarone po.     - When anticoagulation restarted, would use apixaban as this seems to have somewhat less GI bleeding risk.  - Will not be able to cardiovert without anticoagulation but rate controlled  currently.  - He is now agreeable to GI workup (see below).  4. GI bleeding/microcytic anemia: Fe deficiency. Hgb down to 7.6 in 8/20.  At the time, I recommended GI workup but he refused.  This admission, hgb markedly low and stool is heme+. He has had 3 units PRBCs.  He initially refused inpatient GI workup, says he was "too weak."  He got IV Fe.  He is now willing to have GI workup, would like this done prior to ASD closure as he will need ASA/Plavix and also has a need for anticoagulation with atrial fibrillation.  Will need to be on some form of anticoagulation though will try to minimize.  - EGD/colonoscopy tomorrow.   5. Hyponatremia: Hypervolemic hyponatremia.  Na higher at 131 today.   - Fluid restrict.  6. AKI on CKD stage 3: In setting of GI bleeding and anemia.  However, now appears volume overloaded.  BUN/creatinine fairly stable.  7. ASD: Large, hemodynamically significant secundum ASD by RHC and TEE.  PA pressure only mildly elevated.  He will need eventual closure but need to get GI bleeding explored.  - Have asked Dr. Burt Knack to see him for ASD closure, will likely followup with him as outpatient.  - Needs GI workup, now agreeable.   [name] 12/27/2019 9:26 AM

## 2019-12-28 ENCOUNTER — Inpatient Hospital Stay (HOSPITAL_COMMUNITY): Payer: PPO | Admitting: Anesthesiology

## 2019-12-28 ENCOUNTER — Encounter (HOSPITAL_COMMUNITY)
Admission: EM | Disposition: A | Discharge status: Home Health | Payer: Self-pay | Source: Ambulatory Visit | Attending: Student

## 2019-12-28 DIAGNOSIS — K257 Chronic gastric ulcer without hemorrhage or perforation: Secondary | ICD-10-CM

## 2019-12-28 DIAGNOSIS — K2950 Unspecified chronic gastritis without bleeding: Secondary | ICD-10-CM

## 2019-12-28 HISTORY — PX: COLONOSCOPY WITH PROPOFOL: SHX5780

## 2019-12-28 HISTORY — PX: POLYPECTOMY: SHX5525

## 2019-12-28 HISTORY — PX: ESOPHAGOGASTRODUODENOSCOPY (EGD) WITH PROPOFOL: SHX5813

## 2019-12-28 LAB — CBC
HCT: 30.4 % — ABNORMAL LOW (ref 39.0–52.0)
Hemoglobin: 9.3 g/dL — ABNORMAL LOW (ref 13.0–17.0)
MCH: 27 pg (ref 26.0–34.0)
MCHC: 30.6 g/dL (ref 30.0–36.0)
MCV: 88.1 fL (ref 80.0–100.0)
Platelets: 282 10*3/uL (ref 150–400)
RBC: 3.45 MIL/uL — ABNORMAL LOW (ref 4.22–5.81)
RDW: 22.9 % — ABNORMAL HIGH (ref 11.5–15.5)
WBC: 11.3 10*3/uL — ABNORMAL HIGH (ref 4.0–10.5)
nRBC: 0.3 % — ABNORMAL HIGH (ref 0.0–0.2)

## 2019-12-28 LAB — RENAL FUNCTION PANEL
Albumin: 2.8 g/dL — ABNORMAL LOW (ref 3.5–5.0)
Anion gap: 14 (ref 5–15)
BUN: 37 mg/dL — ABNORMAL HIGH (ref 8–23)
CO2: 17 mmol/L — ABNORMAL LOW (ref 22–32)
Calcium: 8.4 mg/dL — ABNORMAL LOW (ref 8.9–10.3)
Chloride: 97 mmol/L — ABNORMAL LOW (ref 98–111)
Creatinine, Ser: 2.41 mg/dL — ABNORMAL HIGH (ref 0.61–1.24)
GFR calc Af Amer: 29 mL/min — ABNORMAL LOW (ref >60)
GFR calc non Af Amer: 25 mL/min — ABNORMAL LOW (ref >60)
Glucose, Bld: 95 mg/dL (ref 70–99)
Phosphorus: 4.7 mg/dL — ABNORMAL HIGH (ref 2.5–4.6)
Potassium: 4.5 mmol/L (ref 3.5–5.1)
Sodium: 128 mmol/L — ABNORMAL LOW (ref 135–145)

## 2019-12-28 LAB — CORTISOL-AM, BLOOD: Cortisol - AM: 24.7 ug/dL — ABNORMAL HIGH (ref 6.7–22.6)

## 2019-12-28 LAB — MAGNESIUM: Magnesium: 2.5 mg/dL — ABNORMAL HIGH (ref 1.7–2.4)

## 2019-12-28 LAB — TSH: TSH: 5.114 u[IU]/mL — ABNORMAL HIGH (ref 0.350–4.500)

## 2019-12-28 LAB — T4, FREE: Free T4: 1.07 ng/dL (ref 0.61–1.12)

## 2019-12-28 SURGERY — ESOPHAGOGASTRODUODENOSCOPY (EGD) WITH PROPOFOL
Anesthesia: Monitor Anesthesia Care

## 2019-12-28 MED ORDER — SODIUM CHLORIDE 0.9 % IV SOLN
INTRAVENOUS | Status: AC | PRN
  Administered 2019-12-28: 500 mL via INTRAMUSCULAR

## 2019-12-28 MED ORDER — PHENYLEPHRINE HCL (PRESSORS) 10 MG/ML IV SOLN
INTRAVENOUS | Status: DC | PRN
  Administered 2019-12-28 (×3): 80 ug via INTRAVENOUS
  Administered 2019-12-28: 40 ug via INTRAVENOUS
  Administered 2019-12-28: 80 ug via INTRAVENOUS
  Administered 2019-12-28: 40 ug via INTRAVENOUS

## 2019-12-28 MED ORDER — PROPOFOL 500 MG/50ML IV EMUL
INTRAVENOUS | Status: DC | PRN
  Administered 2019-12-28: 75 ug/kg/min via INTRAVENOUS

## 2019-12-28 SURGICAL SUPPLY — 25 items

## 2019-12-28 NOTE — Progress Notes (Signed)
Patient transferred to Endo

## 2019-12-28 NOTE — Plan of Care (Signed)
  Problem: Education: Goal: Knowledge of General Education information will improve Description: Including pain rating scale, medication(s)/side effects and non-pharmacologic comfort measures Outcome: Progressing   Problem: Health Behavior/Discharge Planning: Goal: Ability to manage health-related needs will improve Outcome: Progressing   Problem: Nutrition: Goal: Adequate nutrition will be maintained Outcome: Progressing   Problem: Activity: Goal: Capacity to carry out activities will improve Outcome: Progressing   Problem: Cardiac: Goal: Ability to achieve and maintain adequate cardiopulmonary perfusion will improve Outcome: Progressing

## 2019-12-28 NOTE — Progress Notes (Signed)
OT Cancellation Note  Patient Details Name: Waldron Gerry MRN: [id number] DOB: 1941/10/05   Cancelled Treatment:    Reason Eval/Treat Not Completed: Patient at procedure or test/ unavailable. Will attempt back as able.   Daiva Huge Lorraine-COTA/L 12/28/2019, 9:40 AM

## 2019-12-28 NOTE — Anesthesia Procedure Notes (Signed)
Procedure Name: MAC Date/Time: 12/28/2019 10:01 AM Performed by: Lavell Luster, CRNA Pre-anesthesia Checklist: Patient identified, Emergency Drugs available, Suction available, Patient being monitored and Timeout performed Oxygen Delivery Method: Nasal cannula Preoxygenation: Pre-oxygenation with 100% oxygen Induction Type: IV induction Placement Confirmation: positive ETCO2 and breath sounds checked- equal and bilateral Dental Injury: Teeth and Oropharynx as per pre-operative assessment

## 2019-12-28 NOTE — Interval H&P Note (Signed)
History and Physical Interval Note:  12/28/2019 9:28 AM  [name]  has presented today for surgery, with the diagnosis of Blood in stool, anemia.  The various methods of treatment have been discussed with the patient and family. After consideration of risks, benefits and other options for treatment, the patient has consented to  Procedure(s): ESOPHAGOGASTRODUODENOSCOPY (EGD) WITH PROPOFOL (N/A) COLONOSCOPY WITH PROPOFOL (N/A) as a surgical intervention.  The patient's history has been reviewed, patient examined, no change in status, stable for surgery.  I have reviewed the patient's chart and labs.  Questions were answered to the patient's satisfaction.     Larry [name]

## 2019-12-28 NOTE — Anesthesia Postprocedure Evaluation (Signed)
Anesthesia Post Note  Patient: Larry [name]  Procedure(s) Performed: ESOPHAGOGASTRODUODENOSCOPY (EGD) WITH PROPOFOL (N/A ) COLONOSCOPY WITH PROPOFOL (N/A ) POLYPECTOMY     Patient location during evaluation: PACU Anesthesia Type: MAC Level of consciousness: awake and alert Pain management: pain level controlled Vital Signs Assessment: post-procedure vital signs reviewed and stable Respiratory status: spontaneous breathing, nonlabored ventilation, respiratory function stable and patient connected to nasal cannula oxygen Cardiovascular status: stable and blood pressure returned to baseline Postop Assessment: no apparent nausea or vomiting Anesthetic complications: no    Last Vitals:  Vitals:   12/28/19 1223 12/28/19 1341  BP:  (!) 89/59  Pulse:  69  Resp:    Temp: (!) 36.4 C   SpO2:      Last Pain:  Vitals:   12/28/19 1223  TempSrc: Oral  PainSc:                  Tiajuana Amass

## 2019-12-28 NOTE — Op Note (Signed)
[facility] Patient Name: Larry [name] Procedure Date : 12/28/2019 MRN: [id number] Attending MD: Arta Silence , MD Date of Birth: 1941/01/27 CSN: [id number] Age: 79 Admit Type: Inpatient Procedure:                Upper GI endoscopy Indications:              Acute post hemorrhagic anemia, Hematochezia Providers:                Arta Silence, MD, Vista Lawman, RN, Cherylynn Ridges, Technician Referring MD:              Medicines:                Monitored Anesthesia Care Complications:            No immediate complications. Estimated Blood Loss:     Estimated blood loss: none. Procedure:                Pre-Anesthesia Assessment:                           - Prior to the procedure, a History and Physical                            was performed, and patient medications and                            allergies were reviewed. The patient's tolerance of                            previous anesthesia was also reviewed. The risks                            and benefits of the procedure and the sedation                            options and risks were discussed with the patient.                            All questions were answered, and informed consent                            was obtained. Prior Anticoagulants: The patient has                            taken Xarelto (rivaroxaban), last dose was 10 days                            prior to procedure. ASA Grade Assessment: IV - A                            patient with severe systemic disease that is a  constant threat to life. After reviewing the risks                            and benefits, the patient was deemed in                            satisfactory condition to undergo the procedure.                           After obtaining informed consent, the endoscope was                            passed under direct vision. Throughout the   procedure, the patient's blood pressure, pulse, and                            oxygen saturations were monitored continuously. The                            GIF-H190 ([id number] Olympus gastroscope was                            introduced through the mouth, and advanced to the                            second part of duodenum. The upper GI endoscopy was                            accomplished without difficulty. The patient                            tolerated the procedure well. Scope In: Scope Out: Findings:      The examined esophagus was normal.      Patchy mild inflammation was found in the gastric antrum and in the       prepyloric region of the stomach.      One non-bleeding cratered gastric ulcer with no stigmata of bleeding was       found in the prepyloric region of the stomach. The lesion was 5 mm in       largest dimension.      The exam of the stomach was otherwise normal.      The duodenal bulb, first portion of the duodenum and second portion of       the duodenum were normal.      No old or fresh blood seen to the extent of our examination. Impression:               - Normal esophagus.                           - Gastritis.                           - Non-bleeding gastric ulcer with no stigmata of  bleeding.                           - Normal duodenal bulb, first portion of the                            duodenum and second portion of the duodenum. Moderate Sedation:      None Recommendation:           - Perform a colonoscopy today. Procedure Code(s):        --- Professional ---                           [phone], Esophagogastroduodenoscopy, flexible,                            transoral; diagnostic, including collection of                            specimen(s) by brushing or washing, when performed                            (separate procedure) Diagnosis Code(s):        --- Professional ---                           K29.70, Gastritis,  unspecified, without bleeding                           K25.9, Gastric ulcer, unspecified as acute or                            chronic, without hemorrhage or perforation                           D62, Acute posthemorrhagic anemia                           K92.1, Melena (includes Hematochezia) CPT copyright 2019 [employer]. All rights reserved. The codes documented in this report are preliminary and upon coder review may  be revised to meet current compliance requirements. Arta Silence, MD 12/28/2019 10:45:40 AM This report has been signed electronically. Number of Addenda: 0

## 2019-12-28 NOTE — Transfer of Care (Signed)
Immediate Anesthesia Transfer of Care Note  Patient: Yandel Zeiner  Procedure(s) Performed: ESOPHAGOGASTRODUODENOSCOPY (EGD) WITH PROPOFOL (N/A ) COLONOSCOPY WITH PROPOFOL (N/A ) POLYPECTOMY  Patient Location: Endoscopy Unit  Anesthesia Type:MAC  Level of Consciousness: sedated  Airway & Oxygen Therapy: Patient connected to nasal cannula oxygen  Post-op Assessment: Post -op Vital signs reviewed and stable  Post vital signs: stable  Last Vitals:  Vitals Value Taken Time  BP    Temp    Pulse    Resp    SpO2      Last Pain:  Vitals:   12/28/19 0835  TempSrc: Temporal  PainSc: 0-No pain         Complications: No apparent anesthesia complications

## 2019-12-28 NOTE — Progress Notes (Signed)
PROGRESS NOTE  Larry [name] QIW:[id number] DOB: Dec 25, 1940   PCP: Patient, No Pcp Per  Patient is from: Home.  DOA: 12/19/2019 LOS: 9  Brief Narrative / Interim history: 62-year male with history of coronary artery disease, CKD stage IIIb, Hypertension, chronic diastolic congestive heart failure, atrial flutter, hyperlipidemia who presents with weakness.  Or the last couple of days, he was too weak to get out of the bed.  He also had diarrhea with dark stools.  He follows with cardiology for his atrial flutter.  On presentation, his stool was found to be Hemoccult positive, had AKI, hemoglobin of 5.8, A. fib with RVR.  Cardiology and GI consulted.  Patient and family refused EGD/colonoscopy.  Hospital course significant for hyponatremia.  Cardiology (advanced HF team) and nephrology following.   [place] on 12/25/2019 revealed mild pulmonary venous hypertension, elevated right atrial pressure and possible ASD.  TEE on 3/18 confirmed atrial septal defect.  Per cardiology, patient agreeable to GI evaluation for GI bleed before addressing ASD closure.  GI consulted.  EGD on 3/21 revealed gastritis and nonbleeding gastric ulcer.  Colonoscopy on 3/21 revealed internal hemorrhoids, diverticulosis and polyps.  Polyps resected.  Pathology pending.  Subjective: No major events overnight of this morning.  Had EGD and colonoscopy this morning.  No complaints at this time.  He denies chest pain, dyspnea, GI or UTI symptoms.  He says he is uncomfortable with the Unna boot and asking if it can be changed to TED hose.  I&O incomplete.  Renal function worse.  Objective: Vitals:   12/28/19 1102 12/28/19 1103 12/28/19 1126 12/28/19 1223  BP:  (!) 85/51 (!) 86/59   Pulse: 68  69   Resp: (!) 22     Temp:    (!) 97.5 F (36.4 C)  TempSrc:    Oral  SpO2:      Weight:      Height:        Intake/Output Summary (Last 24 hours) at 12/28/2019 1340 Last data filed at 12/28/2019 1014 Gross per 24 hour  Intake 540 ml   Output 507 ml  Net 33 ml   Filed Weights   12/27/19 0447 12/28/19 0420 12/28/19 0835  Weight: 82.1 kg 83.5 kg 83.5 kg    Examination:  GENERAL: No acute distress.  Appears well.  HEENT: MMM.  Vision and hearing grossly intact.  NECK: Supple.  No apparent JVD.  RESP:  No IWOB. Good air movement bilaterally. CVS:  RRR. Heart sounds normal.  ABD/GI/GU: Bowel sounds present. Soft. Non tender.  MSK/EXT:  Moves extremities.  Unna boots over both extremities. SKIN: no apparent skin lesion or wound NEURO: Awake, alert and oriented appropriately.  No apparent focal neuro deficit. PSYCH: Calm. Normal affect.   Procedures:  3/18-RHC-mild pulmonary venous hypertension, elevated right atrial pressure and possible ASD. 3/18-TEE confirmed atrial septal defect but no LA appendage thrombus. 3/21-EGD gastritis and nonbleeding gastric ulcer.   3/21-colonoscopy with internal hemorrhoids, diverticulosis and polyps.  Polyps resected.   Assessment & Plan: Acute blood loss anemia due to upper GI bleed in the setting of supratherapeutic INR: FOBT positive. Baseline Hgb 10-11> 5.8 (admit)>2u> 7.8>1u> 9.0> 9.3.  Anemia panel consistent with iron deficiency.  EGD and colonoscopy as above. -Received IV Feraheme on 3/15 and 3/18. -Continue PPI. -Goal Hgb greater than 8.0 given history of CAD  Acute on chronic diastolic CHF/RV failure: Echo with EF of 60 to 65% with D-shaped septum, normal RVSF but dilated IVC.  [place] on 3/18 as  above.  On high-dose torsemide at home.  Responded to IV Lasix.  I&O incomplete today.  Some AKI and soft blood pressures. -Advanced HF team managing-diuretics on hold -On midodrine 5 mg 3 times daily for soft blood pressure.  Atrial septal defect: TEE on 3/18 confirmed ASD with left-to-right flow. -Plan for surgical closure?  A. fib with RVR: Seems to be in NSR.  Previously on Tikosyn that was stopped due to QTC.  -On amiodarone 200 mg twice daily per cardiology -Xarelto on hold  due to GI bleed-plan to resume with Eliquis when able.   -Per GI, 30 to 40% risk of GI rebleed if anticoagulation resumed in the next 7 days from 3/21  CAD s/p PCI to RCA with stent thrombosis in 2000.  No chest pain. -Off Xarelto and ASA due to GI bleed -Continue statin -Transfusion for Hgb less than 8.0  AKI on CKD stage IIIb/azotemia: Baseline Cr 1.5-1.8 > 2.2 (admit) > 2.05 > 2.21> 2.41.  Likely a combination of hypotension and diuretics.  -Diuretics on hold  Hypervolemic hyponatremia:  In the setting of CHF and CKD.  Baseline Na~130 > 129 (admit) >> 123 > tolvaptan > 129>131> 128.  A.m. cortisol and TFT within normal range. -Fluid restriction  Hypotension: Soft blood pressures. -On midodrine per cardiology.  Hyperlipidemia:  -On Pravachol  Leukocytosis: Likely reactive.  No obvious signs of infection.  Improved. -Continue monitoring  Generalized weakness/debility:  -PT/OT-HH                  DVT prophylaxis: SCD in the setting of GI bleed Code Status: Full code Family Communication: Patient and/or RN.  Updated patient's daughter at bedside.  Discharge barrier: Evaluation and treatment of acute on chronic CHF requiring IV diuretics, AKI, GI bleed and atrial septal defect Patient is from: Home Final disposition: Likely home home likely in 4 to 5 days when stable and cleared by consultants  Consultants: nephrology (off), advanced HF team, GI   Microbiology summarized: COVID-19 negative  Sch Meds:  Scheduled Meds: . amiodarone  400 mg Oral BID  . midodrine  5 mg Oral TID WC  . pantoprazole (PROTONIX) IV  40 mg Intravenous Q12H  . polyethylene glycol-electrolytes  2,000 mL Oral Once  . pravastatin  40 mg Oral QPM  . sodium chloride flush  3 mL Intravenous Q12H   Continuous Infusions: . sodium chloride 250 mL (12/25/19 1829)   PRN Meds:.sodium chloride, acetaminophen **OR** acetaminophen, ipratropium-albuterol, ondansetron **OR** ondansetron  (ZOFRAN) IV  Antimicrobials: Anti-infectives (From admission, onward)   None       I have personally reviewed the following labs and images: CBC: Recent Labs  Lab 12/22/19 0421 12/22/19 0421 12/23/19 0308 12/23/19 0308 12/24/19 0535 12/24/19 1835 12/25/19 1191 12/25/19 4782 12/25/19 9562 12/25/19 0845 12/26/19 0311 12/27/19 0415 12/28/19 0608  WBC 20.2*   < > 16.6*   < > 13.8*  --  14.4*  --   --   --  12.2* 14.5* 11.3*  NEUTROABS 16.3*  --  13.1*  --  10.8*  --   --   --   --   --   --   --   --   HGB 7.7*   < > 7.6*   < > 7.8*   < > 9.2*   < > 9.9* 9.9* 9.3* 9.2* 9.3*  HCT 25.8*   < > 24.2*   < > 25.6*   < > 29.9*   < > 29.0* 29.0* 30.2*  30.6* 30.4*  MCV 85.1   < > 82.0   < > 83.4  --  86.2  --   --   --  87.8 89.2 88.1  PLT 285   < > 319   < > 364  --  359  --   --   --  330 328 282   < > = values in this interval not displayed.   BMP &GFR Recent Labs  Lab 12/24/19 0535 12/24/19 0535 12/25/19 0447 12/25/19 0811 12/25/19 0843 12/25/19 0845 12/26/19 0311 12/27/19 0415 12/28/19 0608  NA 128*   < > 129*   < > 129* 130* 130* 131* 128*  K 4.1   < > 4.5   < > 4.2 4.2 4.6 4.4 4.5  CL 94*  --  95*  --   --   --  97* 97* 97*  CO2 20*  --  20*  --   --   --  20* 20* 17*  GLUCOSE 107*  --  117*  --   --   --  98 145* 95  BUN 57*  --  51*  --   --   --  44* 38* 37*  CREATININE 2.21*  --  2.17*  --   --   --  2.00* 2.19* 2.41*  CALCIUM 8.3*  --  8.3*  --   --   --  8.5* 8.5* 8.4*  MG  --   --  2.7*  --   --   --  2.8* 2.6* 2.5*  PHOS  --   --   --   --   --   --  3.9 4.4 4.7*   < > = values in this interval not displayed.   Estimated Creatinine Clearance: 28.6 mL/min (A) (by C-G formula based on SCr of 2.41 mg/dL (H)). Liver & Pancreas: Recent Labs  Lab 12/25/19 0447 12/26/19 0311 12/27/19 0415 12/28/19 0608  AST 17  --   --   --   ALT 13  --   --   --   ALKPHOS 134*  --   --   --   BILITOT 3.1*  --   --   --   PROT 5.8*  --   --   --   ALBUMIN 2.7* 2.7*  2.8* 2.8*   No results for input(s): LIPASE, AMYLASE in the last 168 hours. No results for input(s): AMMONIA in the last 168 hours. Diabetic: No results for input(s): HGBA1C in the last 72 hours. No results for input(s): GLUCAP in the last 168 hours. Cardiac Enzymes: No results for input(s): CKTOTAL, CKMB, CKMBINDEX, TROPONINI in the last 168 hours. No results for input(s): PROBNP in the last 8760 hours. Coagulation Profile: No results for input(s): INR, PROTIME in the last 168 hours. Thyroid Function Tests: Recent Labs    12/28/19 1204  TSH 5.114*  FREET4 1.07   Lipid Profile: No results for input(s): CHOL, HDL, LDLCALC, TRIG, CHOLHDL, LDLDIRECT in the last 72 hours. Anemia Panel: No results for input(s): VITAMINB12, FOLATE, FERRITIN, TIBC, IRON, RETICCTPCT in the last 72 hours. Urine analysis:    Component Value Date/Time   COLORURINE YELLOW 03/23/2018 [place] 03/23/2018 0727   LABSPEC 1.009 03/23/2018 0727   PHURINE 5.0 03/23/2018 0727   GLUCOSEU NEGATIVE 03/23/2018 0727   HGBUR NEGATIVE 03/23/2018 0727   BILIRUBINUR NEGATIVE 03/23/2018 0727   KETONESUR NEGATIVE 03/23/2018 0727   PROTEINUR NEGATIVE 03/23/2018 0727   NITRITE NEGATIVE  03/23/2018 0727   LEUKOCYTESUR NEGATIVE 03/23/2018 0727   Sepsis Labs: Invalid input(s): PROCALCITONIN, [employer]  Microbiology: Recent Results (from the past 240 hour(s))  SARS CORONAVIRUS 2 (TAT 6-24 HRS) Nasopharyngeal Nasopharyngeal Swab     Status: None   Collection Time: 12/19/19  1:53 PM   Specimen: Nasopharyngeal Swab  Result Value Ref Range Status   SARS Coronavirus 2 NEGATIVE NEGATIVE Final    Comment: (NOTE) SARS-CoV-2 target nucleic acids are NOT DETECTED. The SARS-CoV-2 RNA is generally detectable in upper and lower respiratory specimens during the acute phase of infection. Negative results do not preclude SARS-CoV-2 infection, do not rule out co-infections with other pathogens, and should not be  used as the sole basis for treatment or other patient management decisions. Negative results must be combined with clinical observations, patient history, and epidemiological information. The expected result is Negative. Fact Sheet for Patients: SugarRoll.be Fact Sheet for Healthcare Providers: https://www.woods-mathews.com/ This test is not yet approved or cleared by the [place] FDA and  has been authorized for detection and/or diagnosis of SARS-CoV-2 by FDA under an Emergency Use Authorization (EUA). This EUA will remain  in effect (meaning this test can be used) for the duration of the COVID-19 declaration under Section 56 4(b)(1) of the Act, 21 U.S.C. section 360bbb-3(b)(1), unless the authorization is terminated or revoked sooner. Performed at [facility], [place] [address] [place], [place] [address]     Radiology Studies: No results found.   Peniel Biel T. [place]  If 7PM-7AM, please contact night-coverage [email or web address] Password [email or web address] 12/28/2019, 1:40 PM

## 2019-12-28 NOTE — Op Note (Signed)
[facility] Patient Name: Larry [name] Procedure Date : 12/28/2019 MRN: [id number] Attending MD: Arta Silence , MD Date of Birth: 03-27-1941 CSN: [id number] Age: 79 Admit Type: Inpatient Procedure:                Colonoscopy Indications:              Hematochezia, Acute post hemorrhagic anemia Providers:                Arta Silence, MD, Vista Lawman, RN, Cherylynn Ridges, Technician Referring MD:             [facility] Medicines:                Monitored Anesthesia Care Complications:            No immediate complications. Estimated Blood Loss:     Estimated blood loss: none. Procedure:                Pre-Anesthesia Assessment:                           - Prior to the procedure, a History and Physical                            was performed, and patient medications and                            allergies were reviewed. The patient's tolerance of                            previous anesthesia was also reviewed. The risks                            and benefits of the procedure and the sedation                            options and risks were discussed with the patient.                            All questions were answered, and informed consent                            was obtained. Prior Anticoagulants: The patient has                            taken Xarelto (rivaroxaban), last dose was 10 days                            prior to procedure. ASA Grade Assessment: IV - A                            patient with severe systemic disease that is a  constant threat to life. After reviewing the risks                            and benefits, the patient was deemed in                            satisfactory condition to undergo the procedure.                           - Prior to the procedure, a History and Physical                            was performed, and patient medications and   allergies were reviewed. The patient's tolerance of                            previous anesthesia was also reviewed. The risks                            and benefits of the procedure and the sedation                            options and risks were discussed with the patient.                            All questions were answered, and informed consent                            was obtained. Prior Anticoagulants: The patient has                            taken Xarelto (rivaroxaban), last dose was 10 days                            prior to procedure. ASA Grade Assessment: IV - A                            patient with severe systemic disease that is a                            constant threat to life. After reviewing the risks                            and benefits, the patient was deemed in                            satisfactory condition to undergo the procedure.                           After obtaining informed consent, the colonoscope                            was passed under direct vision. Throughout the  procedure, the patient's blood pressure, pulse, and                            oxygen saturations were monitored continuously. The                            [id number]-[id number] ([id number] Olympus pediatric colonscope                            was introduced through the anus and advanced to the                            the cecum, identified by appendiceal orifice and                            ileocecal valve. The colonoscopy was performed                            without difficulty. The patient tolerated the                            procedure well. The quality of the bowel                            preparation was good. Scope In: 9:59:12 AM Scope Out: 10:35:10 AM Scope Withdrawal Time: 0 hours 28 minutes 37 seconds  Total Procedure Duration: 0 hours 35 minutes 58 seconds  Findings:      The perianal and digital rectal examinations were normal.       Internal hemorrhoids were found during retroflexion. The hemorrhoids       were mild.      No additional abnormalities were found on retroflexion.      Many small and large-mouthed diverticula were found in the sigmoid colon       and descending colon.      Three sessile polyps were found in the cecum. The polyps were 6 to 8 mm       in size. These polyps were removed with a hot snare. Resection and       retrieval were complete.      A 15 mm polyp was found in the transverse colon. The polyp was sessile.       The polyp was removed with a piecemeal technique using a hot snare.       Resection and retrieval were complete.      No other polyps, masses, vascular ectasias, or inflammatory changes seen       to the extent of our examination.      No old or fresh blood was seen to the extent of our examination. Impression:               - Internal hemorrhoids.                           - Diverticulosis in the sigmoid colon and in the                            descending colon.                           -  Three 6 to 8 mm polyps in the cecum, removed with                            a hot snare. Resected and retrieved.                           - One 15 mm polyp in the transverse colon, removed                            piecemeal using a hot snare. Resected and retrieved.                           - The examination was otherwise normal.                           - Patient's bleeding could have been from many                            places (diverticulosis, gastric polyp,                            hemorrhoids). Regards to patient's need for ASD and                            subsequent anticoagulation, I would estimate 30-40%                            risk of significant rebleeding if anticoagulation                            (other than baby aspirin) started within the next 7                            days, and 5-10% risk of rebleeding regardless (in                            light of  polypectomies in proximal colon). Moderate Sedation:      None Recommendation:            Procedure Code(s):        --- Professional ---                           [phone], Colonoscopy, flexible; with removal of                            tumor(s), polyp(s), or other lesion(s) by snare                            technique Diagnosis Code(s):        --- Professional ---                           N36.1, Other hemorrhoids  K63.5, Polyp of colon                           K92.1, Melena (includes Hematochezia)                           D62, Acute posthemorrhagic anemia                           K57.30, Diverticulosis of large intestine without                            perforation or abscess without bleeding CPT copyright 2019 American [employer] Association. All rights reserved. The codes documented in this report are preliminary and upon coder review may  be revised to meet current compliance requirements. Arta Silence, MD 12/28/2019 10:50:50 AM This report has been signed electronically. Number of Addenda: 0

## 2019-12-28 NOTE — Progress Notes (Signed)
Patient ID: Larry [name], male   DOB: Sep 22, 1941, 79 y.o.   MRN: [id number]      Advanced Heart Failure Rounding Note  PCP-Cardiologist: No primary care provider on file.   Subjective:    Admitted with GI bleed. Received 3 U PRBCs total and IV Fe.  Hgb 7.6 => 7.8 => 9.2 => 9.3 => 9.2 => 9.3.  Colonoscopy (3/21) with several polyps removed, diverticulosis, hemorrhoids.  EGD (3/21) with nonbleeding gastric ulcer.    I/Os negative, weight not changed though.   Creatinine up 2.19 => 2.4.   He remains in atrial flutter this morning, rate controlled in 90s.   Still fatigued walking in hall.   RHC Procedural Findings: Hemodynamics (mmHg) RA mean 14 RV 38/11 PA 37/17, mean 25 PCWP mean 15 Oxygen saturations: SVC 54% High RA 60% Low RA 88% PA 88% PV 100% AO 100% Cardiac Output (Fick) 13.6  Cardiac Index (Fick) 6.64 PVR 0.74 WU Shunt fraction 3.7  TEE: Normal LV size and wall thickness.  EF 60-65%, no wall motion abnormalities.  Moderately dilated RV with normal systolic function.  D-shaped interventricular septum suggestive of RV pressure/volume overload.  Moderate left atrial enlargement, no LA appendage thrombus.  Moderate right atrial enlargement.  There is a prominent Eustachian valve.  There is a large secundum ASD with left to right flow, 2.9  X 1.1 cm.  There do appear to be adequate rims for closure. Trivial TR. Mild mitral regurgitation.  Trileaflet aortic valve with no stenosis, trivial regurgitation.   Objective:   Weight Range: 83.5 kg Body mass index is 24.97 kg/m.   Vital Signs:   Temp:  [97.2 F (36.2 C)-97.5 F (36.4 C)] 97.5 F (36.4 C) (03/21 1044) Pulse Rate:  [56-88] 56 (03/21 1051) Resp:  [12-18] 12 (03/21 1051) BP: (89-112)/(52-76) 90/58 (03/21 1051) SpO2:  [98 %-100 %] 100 % (03/21 1051) FiO2 (%):  [21 %] 21 % (03/20 1945) Weight:  [83.5 kg] 83.5 kg (03/21 0835) Last BM Date: 12/27/19(bowel prep )  Weight change: Filed Weights   12/27/19 0447  12/28/19 0420 12/28/19 0835  Weight: 82.1 kg 83.5 kg 83.5 kg    Intake/Output:   Intake/Output Summary (Last 24 hours) at 12/28/2019 1104 Last data filed at 12/28/2019 1014 Gross per 24 hour  Intake 540 ml  Output 807 ml  Net -267 ml      Physical Exam    General: NAD Neck: JVP 10-12, no thyromegaly or thyroid nodule.  Lungs: Clear to auscultation bilaterally with normal respiratory effort. CV: Nondisplaced PMI.  Heart irregular S1/S2, no S3/S4, no murmur.  1+ ankle edema.  Abdomen: Soft, nontender, no hepatosplenomegaly, no distention.  Skin: Intact without lesions or rashes.  Neurologic: Alert and oriented x 3.  Psych: Normal affect. Extremities: No clubbing or cyanosis.  HEENT: Normal.    Telemetry   Atrial flutter 90s (personally reviewed)  EKG    n/a  Labs    CBC Recent Labs    12/27/19 0415 12/28/19 0608  WBC 14.5* 11.3*  HGB 9.2* 9.3*  HCT 30.6* 30.4*  MCV 89.2 88.1  PLT 328 916   Basic Metabolic Panel Recent Labs    12/27/19 0415 12/28/19 0608  NA 131* 128*  K 4.4 4.5  CL 97* 97*  CO2 20* 17*  GLUCOSE 145* 95  BUN 38* 37*  CREATININE 2.19* 2.41*  CALCIUM 8.5* 8.4*  MG 2.6* 2.5*  PHOS 4.4 4.7*   Liver Function Tests Recent Labs    12/27/19  2025 12/28/19 0608  ALBUMIN 2.8* 2.8*   No results for input(s): LIPASE, AMYLASE in the last 72 hours. Cardiac Enzymes No results for input(s): CKTOTAL, CKMB, CKMBINDEX, TROPONINI in the last 72 hours.  BNP: BNP (last 3 results) Recent Labs    05/12/19 1200 12/19/19 1404  BNP 469.5* 192.2*    ProBNP (last 3 results) No results for input(s): PROBNP in the last 8760 hours.   D-Dimer No results for input(s): DDIMER in the last 72 hours. Hemoglobin A1C No results for input(s): HGBA1C in the last 72 hours. Fasting Lipid Panel No results for input(s): CHOL, HDL, LDLCALC, TRIG, CHOLHDL, LDLDIRECT in the last 72 hours. Thyroid Function Tests No results for input(s): TSH, T4TOTAL, T3FREE,  THYROIDAB in the last 72 hours.  Invalid input(s): FREET3  Other results:   Imaging    No results found.   Medications:     Scheduled Medications: . [MAR Hold] amiodarone  400 mg Oral BID  . [MAR Hold] midodrine  5 mg Oral TID WC  . [MAR Hold] pantoprazole (PROTONIX) IV  40 mg Intravenous Q12H  . [MAR Hold] polyethylene glycol-electrolytes  2,000 mL Oral Once  . [MAR Hold] pravastatin  40 mg Oral QPM  . [MAR Hold] sodium chloride flush  3 mL Intravenous Q12H    Infusions: . [MAR Hold] sodium chloride 250 mL (12/25/19 1829)    PRN Medications: [MAR Hold] sodium chloride, [MAR Hold] acetaminophen **OR** [MAR Hold] acetaminophen, [MAR Hold] ipratropium-albuterol, [MAR Hold] ondansetron **OR** [MAR Hold] ondansetron (ZOFRAN) IV     Assessment/Plan   1. Acute on chronic diastolic CHF: With significant component of RV failure.  Echo this admission showed EF 60-65% with D-shaped septum, mild-moderate RV dilation and normal RV systolic function, IVC dilated. On high dose torsemide at home.  RHC suggestive of ASD with high shunt fraction and ASD confirms large secundum ASD.  Exam and RHC suggest ongoing right-sided CHF.  I suspect we will not get his CVP down into normal range until the ASD is closed.  Creatinine up today to 2.4.  - Hold diuretics today, restart home torsemide when creatinine starts trending down.    - He is on midodrine 5 mg tid with soft BP, generally SBP running 90s.   2. CAD: PCI to RCA, had stent thrombosis back in 2000.No CP. - He has not been on ASA given Xarelto use. Would hold for now with GI bleeding.   - Continue pravastatin,good lipids in 9/20. 3. Atrial fibrillation/flutter: Paroxysmal, had been in NSR earlier in his admission but now in atrial flutter with controlled rate.  He had to stop Tikosyn due to long QTc on lowest dose Tikosyn. He is now off Xarelto with GI bleeding.  Large ASD is likely driving the atrial fibrillation/flutter.  -  Continue amiodarone po, decrease to 200 mg bid.     - When anticoagulation restarted, would use apixaban as this seems to have somewhat less GI bleeding risk.  - Will not be able to cardiovert without anticoagulation but rate controlled currently.  4. GI bleeding/microcytic anemia: Fe deficiency. Hgb down to 7.6 in 8/20.  At the time, I recommended GI workup but he refused.  This admission, hgb markedly low and stool heme+. He has had 3 units PRBCs.  He initially refused inpatient GI workup, says he was "too weak."  He got IV Fe.  He had EGD/colonoscopy today, there was a nonbleeding gastric ulcer, several polyps removed, diverticulosis, and hemorrhoids.  Per GI note, 30-40% risk  of rebleeding if anticoagulation started in the next 7 days.  - Continue PPI - Hold anticoagulation for 7 more days, will start him on Eliquis at that time if no further overt bleeding.  5. Hyponatremia: Hypervolemic hyponatremia.  Na 128 today.   - Fluid restrict.  6. AKI on CKD stage 3: In setting of GI bleeding and anemia.  Creatinine higher today, holding diuretics.  7. ASD: Large, hemodynamically significant secundum ASD by RHC and TEE.  PA pressure only mildly elevated.  He will need eventual closure.  - Have asked Dr. Burt Knack to see him for ASD closure, will likely followup with him as outpatient.   He can go home when creatinine starts to trend down and we are able to restart his po torsemide.   [name] 12/28/2019 11:04 AM

## 2019-12-28 NOTE — Anesthesia Preprocedure Evaluation (Addendum)
Anesthesia Evaluation  Patient identified by MRN, date of birth, ID band Patient awake    Reviewed: Allergy & Precautions, NPO status , Patient's Chart, lab work & pertinent test results  Airway Mallampati: II  TM Distance: >3 FB Neck ROM: Full    Dental   Pulmonary former smoker,    breath sounds clear to auscultation       Cardiovascular hypertension, Pt. on medications + CAD, + Past MI, + Cardiac Stents and +CHF  + dysrhythmias Atrial Fibrillation  Rhythm:Regular Rate:Normal     Neuro/Psych negative neurological ROS     GI/Hepatic Neg liver ROS, GI bleed    Endo/Other  negative endocrine ROS  Renal/GU Renal disease     Musculoskeletal   Abdominal   Peds  Hematology negative hematology ROS (+)   Anesthesia Other Findings   Reproductive/Obstetrics                            Anesthesia Physical Anesthesia Plan  ASA: IV  Anesthesia Plan: MAC   Post-op Pain Management:    Induction: Intravenous  PONV Risk Score and Plan: 1 and Ondansetron, Propofol infusion and Treatment may vary due to age or medical condition  Airway Management Planned: Natural Airway and Nasal Cannula  Additional Equipment:   Intra-op Plan:   Post-operative Plan:   Informed Consent: I have reviewed the patients History and Physical, chart, labs and discussed the procedure including the risks, benefits and alternatives for the proposed anesthesia with the patient or authorized representative who has indicated his/her understanding and acceptance.       Plan Discussed with: CRNA  Anesthesia Plan Comments:        Anesthesia Quick Evaluation

## 2019-12-29 ENCOUNTER — Ambulatory Visit (HOSPITAL_COMMUNITY): Payer: PPO | Admitting: Nurse Practitioner

## 2019-12-29 ENCOUNTER — Encounter (HOSPITAL_COMMUNITY): Payer: Self-pay

## 2019-12-29 DIAGNOSIS — D649 Anemia, unspecified: Secondary | ICD-10-CM

## 2019-12-29 LAB — CBC
HCT: 30.8 % — ABNORMAL LOW (ref 39.0–52.0)
Hemoglobin: 9.4 g/dL — ABNORMAL LOW (ref 13.0–17.0)
MCH: 27.5 pg (ref 26.0–34.0)
MCHC: 30.5 g/dL (ref 30.0–36.0)
MCV: 90.1 fL (ref 80.0–100.0)
Platelets: 266 10*3/uL (ref 150–400)
RBC: 3.42 MIL/uL — ABNORMAL LOW (ref 4.22–5.81)
RDW: 23.4 % — ABNORMAL HIGH (ref 11.5–15.5)
WBC: 10.7 10*3/uL — ABNORMAL HIGH (ref 4.0–10.5)
nRBC: 0.2 % (ref 0.0–0.2)

## 2019-12-29 LAB — RENAL FUNCTION PANEL
Albumin: 2.8 g/dL — ABNORMAL LOW (ref 3.5–5.0)
Anion gap: 14 (ref 5–15)
BUN: 39 mg/dL — ABNORMAL HIGH (ref 8–23)
CO2: 18 mmol/L — ABNORMAL LOW (ref 22–32)
Calcium: 8.4 mg/dL — ABNORMAL LOW (ref 8.9–10.3)
Chloride: 96 mmol/L — ABNORMAL LOW (ref 98–111)
Creatinine, Ser: 2.44 mg/dL — ABNORMAL HIGH (ref 0.61–1.24)
GFR calc Af Amer: 29 mL/min — ABNORMAL LOW (ref >60)
GFR calc non Af Amer: 25 mL/min — ABNORMAL LOW (ref >60)
Glucose, Bld: 84 mg/dL (ref 70–99)
Phosphorus: 4.6 mg/dL (ref 2.5–4.6)
Potassium: 4.5 mmol/L (ref 3.5–5.1)
Sodium: 128 mmol/L — ABNORMAL LOW (ref 135–145)

## 2019-12-29 LAB — MAGNESIUM: Magnesium: 2.7 mg/dL — ABNORMAL HIGH (ref 1.7–2.4)

## 2019-12-29 NOTE — Consult Note (Addendum)
[place] VALVE TEAM  Inpatient structural heart consultation:   Patient ID: Larry [name]; [id number]; 1941/05/18   Admit date: 12/19/2019 Date of Consult: 12/29/2019  Primary Care Provider: Patient, No Pcp Per Primary Cardiologist: Dr. Aundra Dubin  Patient Profile:   Larry [name] is a 79 y.o. male with a hx of CAD, persistent afib/flutter on Xarelto (previously on Tikosyn) , HTN, iron deficiency anemia, OSA on CPAP, and CKD stage IV who is being seen today for the evaluation of ASD at the request of Dr. Aundra Dubin.  History of Present Illness:   Patient has a history of CAD with initial PCI to RCA in 11/1998. This was complicated by stent thrombosis with inferior STEMI in 08/1999 requiring PCI.  In 2017, he was noted to be in atrial fibrillation persistently. He decided not to undergo cardioversion. Echo in 02/2018 showed EF 50-55% with severe biatrial enlargement.   He was admitted in 03/2018 for acute CHF and IV diuresis. He was started on Tikosyn while in hospital with conversion to NSR. Larry [name] was followed by Dr. Johnsie Cancel for a long time and referred to Dr. Aundra Dubin in 04/2018 for management of CHF. He has also been closely followed in the afib clinic by Roderic Palau NP.   He was last seen by Darrick Grinder in the advanced CHF clinic on 12/04/19. His QTc was noted to be 560 ms. Follow up electrolytes were normal so Tikosyn was discontinued. There was also some concern over potential cardiac amyloidosis. Patient declined further work up at that time due to concerns about Covid 19 exposure.   He was seen by Roderic Palau in the afib clinic on 12/16/19 and found to be back in atrial flutter with variable block with HR 117. He also reported having had watery diarrhea and was hypotensive with BP 96/70. Plans were made for consultation with Dr. Rayann Heman to discuss ablation vs amiodarone. Admission was discussed but patient and wife were very reluctant.   He was brought  back on 12/19/19 for repeat ECG and echo. He was noted to be weak and pale and also reported blood in his stool. He was finally agreeable to be evaluated in the ER.   In the ED he was persistently tachycardic to the 110s, hypotensive with BP 80s-100s/50-70s, intermittently tachypneic to the 20s, satting well on O2 via [place], and afebrile. He was found to have an acute GI bleed on arrival with a +stool quaiac and Hgb 5.8. Additionally labs were notable for Na 129, K 5.6, Cr 2.2 (baseline 1.5), WBC 14.5, PLT 336, INR 4.1, AST/ALT wnl, Tbili 2.1, and BNP 192. EKG revealed atrial flutter with variable AV block with HR 102.  He was treated with 3U PRBCs, IV Faraheme and IVFs. Nephrology was consulted for AKI (creat up to 2.44) and hyponatremia (NA down to 123). He was treated with tolvaptan and fluid restriction with improvement of sodium. GI was consulted and he underwent colonoscopy (3/21) with several polyps removed, diverticulosis, and hemorrhoids. EGD (3/21) showed non bleeding gastric ulcer.   Echo this admission showed 60-65% with D-shaped septum, mild-moderate RV dilation and normal RV systolic function, IVC dilated.RHC suggestive of ASD with high shunt fraction and TEE (3/18) confirmed large secundum ASD. TEE showed EF 60-65%, moderately dilated RV with normal systolic function. D-shaped interventricular septum suggestive of RV pressure/volume overload. Moderate left atrial enlargement, no LA appendage thrombus. Moderate right atrial enlargement. There is a prominent Eustachian valve. There is a large secundum ASD with  left to right flow, 2.9  X 1.1 cm. There do appear to be adequate rims for closure.  He has been treated with IV lasix which is currently on hold due to renal insufficiency (creat 2.44). Also on midodrine for BP support. He has been started on amiodarone for rate control. Xarelto on hold given GI bleed w/ acute blood loss anemia. Plan to restart Eliquis when stable given somewhat less of GI  bleeding risk.   The structural heart team is consulted for consideration of ASD closure. He is currently feeling much better, but still very weak and short of breath with exertion. He walked down the hall with PT earlier today and had significant dyspnea. No shortness of breath at rest. No chest pain. He has persistent LE edema. Also has had some weeping in his left elbow (has a history of olecranon bursitis). No dizziness or syncope.    Past Medical History:  Diagnosis Date  . Coronary artery disease    status post remote PCI of the mid to distal RCA 11/1998 followed by acute ST elevation MI inferiorly 08/1999 with occlusion of the prior RCA stent as well as 70% diagonal branch.  He underwent PCI of the RCA  . Hypercholesterolemia   . Hypertension   . Myocardial infarct ([place])   . Olecranon bursitis     Past Surgical History:  Procedure Laterality Date  . CARDIAC CATHETERIZATION  12/15/1999   EF was 55%   . COLONOSCOPY WITH PROPOFOL N/A 12/28/2019   Procedure: COLONOSCOPY WITH PROPOFOL;  Surgeon: Arta Silence, MD;  Location: [facility];  Service: Endoscopy;  Laterality: N/A;  . CORONARY ANGIOPLASTY WITH STENT PLACEMENT  08/15/1999   CAD, status post prior stenting of the mid to distal RCA in 11/1998, with an acute diaphragmatic wall infarction due to total occlusion at the stent site/There is also 70% narrowing in the diagonal branch of the LAD/The LV showed inferior wall hypo- akinesis.-- Successful reperfusion, percutaneous transluminal coronary percutaneous transluminal coronary & placement of a 2nd overlying stent in RCA  . ESOPHAGOGASTRODUODENOSCOPY (EGD) WITH PROPOFOL N/A 12/28/2019   Procedure: ESOPHAGOGASTRODUODENOSCOPY (EGD) WITH PROPOFOL;  Surgeon: Arta Silence, MD;  Location: [place];  Service: Endoscopy;  Laterality: N/A;  . POLYPECTOMY  12/28/2019   Procedure: POLYPECTOMY;  Surgeon: Arta Silence, MD;  Location: [facility] ENDOSCOPY;  Service: Endoscopy;;  . RIGHT HEART  CATH N/A 12/25/2019   Procedure: RIGHT HEART CATH;  Surgeon: Larey Dresser, MD;  Location: [facility];  Service: Cardiovascular;  Laterality: N/A;  . TEE WITHOUT CARDIOVERSION N/A 12/25/2019   Procedure: TRANSESOPHAGEAL ECHOCARDIOGRAM (TEE);  Surgeon: Larey Dresser, MD;  Location: [facility] ENDOSCOPY;  Service: Cardiovascular;  Laterality: N/A;     Inpatient Medications: Scheduled Meds: . amiodarone  400 mg Oral BID  . midodrine  5 mg Oral TID WC  . pantoprazole (PROTONIX) IV  40 mg Intravenous Q12H  . polyethylene glycol-electrolytes  2,000 mL Oral Once  . pravastatin  40 mg Oral QPM  . sodium chloride flush  3 mL Intravenous Q12H   Continuous Infusions: . sodium chloride 250 mL (12/25/19 1829)   PRN Meds: sodium chloride, acetaminophen **OR** acetaminophen, ipratropium-albuterol, ondansetron **OR** ondansetron (ZOFRAN) IV  Allergies:    Allergies  Allergen Reactions  . Plavix [Clopidogrel Bisulfate] Anaphylaxis         Social History:   Social History   Socioeconomic History  . Marital status: Married    Spouse name: scarlett  . Number of children: 3  .  Years of education: 72  . Highest education level: Not on file  Occupational History  . Occupation: retired  Tobacco Use  . Smoking status: Former Smoker    Types: Cigarettes    Quit date: 11/02/1999    Years since quitting: 20.1  . Smokeless tobacco: Never Used  Substance and Sexual Activity  . Alcohol use: No  . Drug use: No  . Sexual activity: Not on file  Other Topics Concern  . Not on file  Social History Narrative  . Not on file   Social Determinants of Health   Financial Resource Strain:   . Difficulty of Paying Living Expenses:   Food Insecurity:   . Worried About Charity fundraiser in the Last Year:   . Arboriculturist in the Last Year:   Transportation Needs:   . Film/video editor (Medical):   Marland Kitchen Lack of Transportation (Non-Medical):   Physical Activity:   . Days of Exercise per  Week:   . Minutes of Exercise per Session:   Stress:   . Feeling of Stress :   Social Connections:   . Frequency of Communication with Friends and Family:   . Frequency of Social Gatherings with Friends and Family:   . Attends Religious Services:   . Active Member of Clubs or Organizations:   . Attends Archivist Meetings:   Marland Kitchen Marital Status:   Intimate Partner Violence:   . Fear of Current or Ex-Partner:   . Emotionally Abused:   Marland Kitchen Physically Abused:   . Sexually Abused:     Family History:   The patient's family history includes Cancer in his father; Heart Problems in his mother.  ROS:  Please see the history of present illness.  ROS  All other ROS reviewed and negative.     Physical Exam/Data:   Vitals:   12/28/19 2350 12/29/19 0440 12/29/19 0638 12/29/19 1105  BP: 93/63 96/67  (!) 93/59  Pulse: 70 68  73  Resp: 18 16  20   Temp:  98.4 F (36.9 C)  98.4 F (36.9 C)  TempSrc:  Oral  Oral  SpO2:  95%  96%  Weight:   84.1 kg   Height:        Intake/Output Summary (Last 24 hours) at 12/29/2019 1559 Last data filed at 12/29/2019 1257 Gross per 24 hour  Intake 640 ml  Output 800 ml  Net -160 ml   Filed Weights   12/28/19 0420 12/28/19 0835 12/29/19 0638  Weight: 83.5 kg 83.5 kg 84.1 kg   Body mass index is 25.13 kg/m.  General:  Well nourished, well developed, in no acute distress, elderly and frail HEENT: normal Lymph: no adenopathy Neck: +JVD, bilateral carotid bruits Cardiac:  normal S1, S2; RRR Lungs:  clear to auscultation bilaterally. Faint scattered wheeze Abd: soft, nontender, no hepatomegaly  Ext: bilateral unna boots with 2+ bilateral pitting edema and pitting edema in arms around elbow. Left elbow with weeping.  Musculoskeletal:  No deformities, BUE and BLE strength normal and equal Skin: warm and dry  Neuro:  CNs 2-12 intact, no focal abnormalities noted Psych:  Normal affect   EKG:  The EKG was personally reviewed and demonstrates:   Atrial flutter with RVR Telemetry:  Telemetry was personally reviewed and demonstrates:  Aflutter Hr in 80s  Relevant CV Studies: Echo 12/22/19 IMPRESSIONS 1. Hypokinesis of the basal septum. Left ventricular ejection fraction,  by estimation, is 60 to 65%. The left ventricle has normal  function. The  left ventricle demonstrates regional wall motion abnormalities (see  scoring diagram/findings for  description). There is mild concentric left ventricular hypertrophy. Left  ventricular diastolic parameters are indeterminate.  2. Right ventricular systolic function is normal. The right ventricular  size is normal. There is normal pulmonary artery systolic pressure.  3. Left atrial size was severely dilated.  4. The mitral valve is normal in structure. No evidence of mitral valve  regurgitation. No evidence of mitral stenosis.  5. The aortic valve is normal in structure. Aortic valve regurgitation is  not visualized. No aortic stenosis is present.  6. The inferior vena cava is dilated in size with >50% respiratory  variability, suggesting right atrial pressure of 8 mmHg.   FINDINGS  Left Ventricle: Hypokinesis of the basal septum. Left ventricular  ejection fraction, by estimation, is 60 to 65%. The left ventricle has  normal function. The left ventricle demonstrates regional wall motion  abnormalities. The left ventricular internal  cavity size was normal in size. There is mild concentric left ventricular  hypertrophy. Left ventricular diastolic parameters are indeterminate. The  ratio of pulmonic flow to systemic flow (Qp/Qs ratio) is 1.40.   Right Ventricle: The right ventricular size is normal. No increase in  right ventricular wall thickness. Right ventricular systolic function is  normal. There is normal pulmonary artery systolic pressure. The tricuspid  regurgitant velocity is 2.20 m/s, and  with an assumed right atrial pressure of 8 mmHg, the estimated right    ventricular systolic pressure is 93.8 mmHg.   Left Atrium: Left atrial size was severely dilated.   Right Atrium: Right atrial size was normal in size.   Pericardium: There is no evidence of pericardial effusion.   Mitral Valve: The mitral valve is normal in structure. Normal mobility of  the mitral valve leaflets. No evidence of mitral valve regurgitation. No  evidence of mitral valve stenosis.   Tricuspid Valve: The tricuspid valve is normal in structure. Tricuspid  valve regurgitation is mild . No evidence of tricuspid stenosis.   Aortic Valve: The aortic valve is normal in structure. Aortic valve  regurgitation is not visualized. No aortic stenosis is present.   Pulmonic Valve: The pulmonic valve was normal in structure. Pulmonic valve  regurgitation is not visualized. No evidence of pulmonic stenosis.   Aorta: The aortic root is normal in size and structure.   Venous: The inferior vena cava is dilated in size with greater than 50%  respiratory variability, suggesting right atrial pressure of 8 mmHg.   IAS/Shunts: No atrial level shunt detected by color flow Doppler. The  ratio of pulmonic flow to systemic flow (Qp/Qs ratio) is 1.40.     LEFT VENTRICLE  PLAX 2D  LVIDd:     4.37 cm Diastology  LVIDs:     2.99 cm LV e' lateral:  11.60 cm/s  LV PW:     1.12 cm LV E/e' lateral: 8.6  LV IVS:    1.04 cm LV e' medial:  9.14 cm/s  LVOT diam:   2.10 cm LV E/e' medial: 10.9  LV SV:     65  LV SV Index:  32  LVOT Area:   3.46 cm     RIGHT VENTRICLE       IVC  RV Basal diam: 2.92 cm   IVC diam: 2.57 cm  RV S prime:   13.90 cm/s  RVOT diam:   2.50 cm  TAPSE (M-mode): 1.7 cm   LEFT ATRIUM  Index    RIGHT ATRIUM      Index  LA diam:    5.60 cm 2.74 cm/m RA Area:   25.20 cm  LA Vol (A2C):  87.7 ml 42.85 ml/m RA Volume:  77.50 ml 37.86 ml/m  LA Vol (A4C):  89.2 ml 43.58 ml/m  LA Biplane Vol:  89.3 ml 43.63 ml/m  AORTIC VALVE       PULMONIC VALVE  LVOT Vmax:  103.00 cm/s RVOT Peak grad: 3 mmHg  LVOT Vmean: 65.800 cm/s  LVOT VTI:  0.189 m    AORTA  Ao Root diam: 3.70 cm  Ao Asc diam: 3.50 cm   MITRAL VALVE        TRICUSPID VALVE  MV Area (PHT): 4.36 cm  TR Peak grad:  19.4 mmHg  MV Decel Time: 174 msec  TR Vmax:    220.00 cm/s  MV E velocity: 99.50 cm/s               SHUNTS               Systemic VTI: 0.19 m               Systemic Diam: 2.10 cm               Pulmonic VTI: 0.179 m               Pulmonic Diam: 2.50 cm               Qp/Qs:     1.34   ___________________  RHC 12/25/19 RIGHT HEART CATH  Conclusion 1. Elevated right atrial pressure.  2. Mild pulmonary venous hypertension.  3. Shunt fraction 3.7 with shunt run suggesting significant ASD.   Hemodynamics (mmHg) RHC Procedural Findings RA mean 14 RV 38/11 PA 37/17, mean 25 PCWP mean 15 Oxygen saturations: SVC 54% High RA 60% Low RA 88% PA 88% PV 100% AO 100% Cardiac Output (Fick) 13.6  Cardiac Index (Fick) 6.64 PVR 0.74 WU Shunt fraction 3.7  He will need a TEE.    ___________________  TEE 12/25/19 Procedure: TEE  Indication: ?ASD  Findings: Please see echo section for full report.  Normal LV size and wall thickness.  EF 60-65%, no wall motion abnormalities.  Moderately dilated RV with normal systolic function. D-shaped interventricular septum suggestive of RV pressure/volume overload. Moderate left atrial enlargement, no LA appendage thrombus.  Moderate right atrial enlargement.  There is a prominent Eustachian valve. There is a large secundum ASD with left to right flow, 2.9  X 1.1 cm.  There do appear to be adequate rims for closure. Trivial TR. Mild mitral regurgitation.  Trileaflet aortic valve with no stenosis, trivial regurgitation.  Normal caliber thoracic aorta,  minimal plaque.   Impression: Large secundum ASD.  Will need to discuss closure.   Laboratory Data:  Chemistry Recent Labs  Lab 12/27/19 0415 12/28/19 0608 12/29/19 0659  NA 131* 128* 128*  K 4.4 4.5 4.5  CL 97* 97* 96*  CO2 20* 17* 18*  GLUCOSE 145* 95 84  BUN 38* 37* 39*  CREATININE 2.19* 2.41* 2.44*  CALCIUM 8.5* 8.4* 8.4*  GFRNONAA 28* 25* 25*  GFRAA 33* 29* 29*  ANIONGAP 14 14 14     Recent Labs  Lab 12/25/19 0447 12/26/19 0311 12/27/19 0415 12/28/19 0608 12/29/19 0659  PROT 5.8*  --   --   --   --   ALBUMIN 2.7*   < > 2.8* 2.8* 2.8*  AST 17  --   --   --   --  ALT 13  --   --   --   --   ALKPHOS 134*  --   --   --   --   BILITOT 3.1*  --   --   --   --    < > = values in this interval not displayed.   Hematology Recent Labs  Lab 12/27/19 0415 12/28/19 0608 12/29/19 0659  WBC 14.5* 11.3* 10.7*  RBC 3.43* 3.45* 3.42*  HGB 9.2* 9.3* 9.4*  HCT 30.6* 30.4* 30.8*  MCV 89.2 88.1 90.1  MCH 26.8 27.0 27.5  MCHC 30.1 30.6 30.5  RDW 22.5* 22.9* 23.4*  PLT 328 282 266   Cardiac EnzymesNo results for input(s): TROPONINI in the last 168 hours. No results for input(s): TROPIPOC in the last 168 hours.  BNPNo results for input(s): BNP, PROBNP in the last 168 hours.  DDimer No results for input(s): DDIMER in the last 168 hours.  Radiology/Studies:  No results found.  Assessment and Plan:   Radiographic results, hospital notes, lab results, and cardiac imaging data are reviewed. TEE images are reviewed and demonstrate EF 60-65%, moderately dilated RV with normal systolic function. D-shaped interventricular septum suggestive of RV pressure/volume overload. Moderate left atrial enlargement, no LA appendage thrombus.  Moderate right atrial enlargement. There is a prominent Eustachian valve. There is a large secundum ASD with left to right flow, 2.9  X 1.1 cm. There do appear to be adequate rims for closure. RHC suggestive of ASD with high shunt fraction of  3.7.  Suspect worsening right sided heart failure and afib/flutter will be more easily controlled once ASD is closed  I discussed risks, indications, and alternatives of transcatheter ASD closure with the patient and his wife at length today.  I demonstrated the device closure procedure as well.  He understands that risks of vascular injury, device embolization, late device erosion, stroke, heart attack, cardiac perforation, cardiac tamponade, emergency surgery, and death occur at low rates of less than 1%. Dr. Burt Knack to review and determine if transcatheter ASD closure is technically feasible.  Dr. Burt Knack to follow.   Signed, Angelena Form, PA-C  12/29/2019 3:59 PM   Patient seen, examined. Available data reviewed. Agree with findings, assessment, and plan as outlined by Nell Range, PA-C.  Patient is independently interviewed and examined.  He is alert, oriented, in no distress.  Lungs are clear, carotids are normal with no bruits, heart is irregularly irregular, I do not appreciate any murmur.  Abdomen soft and nontender, extremities show 1+ bilateral pretibial and ankle edema.  Neurologic is grossly intact with strength intact and equal bilaterally.  Skin is warm and dry.  EKG shows atrial flutter, low voltage.  Transesophageal echo shows a large secundum ASD, persistent eustachian valve, and echo signs of RV volume overload.  The patient's cardiac catheterization data is also reviewed which shows a large left-to-right shunt with a QP: QS of 3.7: 1.  The patient's history is complicated by iron deficiency anemia and gastrointestinal bleeding.  Extensive notes from gastroenterology and heart failure are reviewed.  ASD closure is clearly indicated to prevent further complications of right heart failure.  At this point, closure of his ASD will not impact atrial fibrillation/flutter.  He will require long-term anticoagulation when this is safe to resume.  I think his ASD is amenable to transcatheter  closure.  He will likely require a large device but he appears to have adequate tissue rims.  I have reviewed risks, indications, and alternatives to transcatheter  ASD closure at length with the patient and his family members who are at the bedside tonight.  They all understand that risks include vascular injury, bleeding, stroke, myocardial infarction, late device erosion, infection, cardiac injury, tamponade, emergency surgery, and death.  These risks occur at a frequency of approximately 1%.  They would like to proceed.  I reviewed timing of closure with Dr. Aundra Dubin and it is probably best to start him on anticoagulation when he is able, then proceed with elective transcatheter ASD closure.  I will ask him to hold apixaban the day before the procedure.  We will be in contact with him to schedule and they would like to do so at the next available time.  Sherren Mocha, M.D. 12/29/2019 11:25 PM

## 2019-12-29 NOTE — Progress Notes (Signed)
Physical Therapy Treatment Patient Details Name: Larry [name] MRN: [id number] DOB: 09/02/1941 Today's Date: 12/29/2019    History of Present Illness Pt adm with weakness and found to have upper GI bleed, AKI, and afib with RVR. PMH - cad, ckd, htn, chf, a flutter    PT Comments    Patient progressing slowly towards PT goals. Reports feeling "worn out" from his colonoscopy yesterday. Tolerated gait training with Min guard assist for balance/safety. Sp02 dropped to 87% on RA but recovered quickly with breathing. Continues to require Min A for standing from chair. Encouraged walking with nursing later. Concerned about possible weeping but cannot find where from. Will follow.   Follow Up Recommendations  Home health PT;Supervision for mobility/OOB     Equipment Recommendations  None recommended by PT    Recommendations for Other Services       Precautions / Restrictions Precautions Precautions: Fall Precaution Comments: watch 02 and HR Restrictions Weight Bearing Restrictions: No    Mobility  Bed Mobility Overal bed mobility: Needs Assistance Bed Mobility: Sit to Supine       Sit to supine: Mod assist;HOB elevated   General bed mobility comments: Assist bringing LEs into bed to return to supine.  Transfers Overall transfer level: Needs assistance Equipment used: Rolling walker (2 wheeled) Transfers: Sit to/from Stand Sit to Stand: Min assist         General transfer comment: Assist to power to standing with cues for hand placement/technique.  Ambulation/Gait Ambulation/Gait assistance: Min guard Gait Distance (Feet): 100 Feet Assistive device: Rolling walker (2 wheeled) Gait Pattern/deviations: Step-through pattern;Decreased stride length;Trunk flexed Gait velocity: decr   General Gait Details: Slow, mostly steady gait; 1 standing rest break. 2/4 DOE. Sp02 dropped to 87% on RA. HR irregular.   Stairs             Wheelchair Mobility    Modified  Rankin (Stroke Patients Only)       Balance Overall balance assessment: Needs assistance Sitting-balance support: Feet supported;No upper extremity supported Sitting balance-Leahy Scale: Good     Standing balance support: During functional activity Standing balance-Leahy Scale: Poor Standing balance comment: Requires UE support                            Cognition Arousal/Alertness: Awake/alert Behavior During Therapy: WFL for tasks assessed/performed Overall Cognitive Status: Within Functional Limits for tasks assessed                                        Exercises      General Comments General comments (skin integrity, edema, etc.): Sp02 dropped to 87% on RA; noted to have wet gown- could not find source, likely some weeping??      Pertinent Vitals/Pain Pain Assessment: Faces Faces Pain Scale: Hurts a little bit Pain Location: bilat LE Pain Descriptors / Indicators: Sore;Aching Pain Intervention(s): Repositioned;Monitored during session;Limited activity within patient's tolerance    Home Living                      Prior Function            PT Goals (current goals can now be found in the care plan section) Progress towards PT goals: Progressing toward goals    Frequency    Min 3X/week      PT Plan Current plan  remains appropriate    Co-evaluation              AM-PAC PT "6 Clicks" Mobility   Outcome Measure  Help needed turning from your back to your side while in a flat bed without using bedrails?: None Help needed moving from lying on your back to sitting on the side of a flat bed without using bedrails?: A Little Help needed moving to and from a bed to a chair (including a wheelchair)?: A Little Help needed standing up from a chair using your arms (e.g., wheelchair or bedside chair)?: A Little Help needed to walk in hospital room?: A Little Help needed climbing 3-5 steps with a railing? : A Lot 6 Click  Score: 18    End of Session Equipment Utilized During Treatment: Gait belt Activity Tolerance: Patient limited by fatigue Patient left: in bed;with call bell/phone within reach;with family/visitor present Nurse Communication: Mobility status PT Visit Diagnosis: Difficulty in walking, not elsewhere classified (R26.2);Muscle weakness (generalized) (M62.81)     Time: 0158-6825 PT Time Calculation (min) (ACUTE ONLY): 24 min  Charges:  $Gait Training: 8-22 mins $Therapeutic Activity: 8-22 mins                     Marisa Severin, PT, DPT Acute Rehabilitation Services Pager [phone] Office [place] 12/29/2019, 3:29 PM

## 2019-12-29 NOTE — Procedures (Signed)
Patient declined CPAP at this time

## 2019-12-29 NOTE — Progress Notes (Signed)
OT Cancellation Note  Patient Details Name: Larry [name] MRN: [id number] DOB: 1940/10/31   Cancelled Treatment:    Reason Eval/Treat Not Completed: Fatigue/lethargy limiting ability to participate. Pt nad wife report that pt is too fatigued after recent PT session. OT will try back next available time  Britt Bottom 12/29/2019, 1:53 PM

## 2019-12-29 NOTE — Plan of Care (Signed)
  Problem: Education: Goal: Knowledge of General Education information will improve Description: Including pain rating scale, medication(s)/side effects and non-pharmacologic comfort measures 12/29/2019 2206 by Mickie Kay, RN Outcome: Progressing 12/29/2019 2205 by Mickie Kay, RN Outcome: Progressing   Problem: Health Behavior/Discharge Planning: Goal: Ability to manage health-related needs will improve 12/29/2019 2206 by Mickie Kay, RN Outcome: Progressing 12/29/2019 2205 by Mickie Kay, RN Outcome: Progressing   Problem: Clinical Measurements: Goal: Ability to maintain clinical measurements within normal limits will improve 12/29/2019 2206 by Mickie Kay, RN Outcome: Progressing 12/29/2019 2205 by Mickie Kay, RN Outcome: Progressing Goal: Diagnostic test results will improve 12/29/2019 2206 by Mickie Kay, RN Outcome: Progressing 12/29/2019 2205 by Mickie Kay, RN Outcome: Progressing Goal: Respiratory complications will improve 12/29/2019 2206 by Mickie Kay, RN Outcome: Progressing 12/29/2019 2205 by Mickie Kay, RN Outcome: Progressing Goal: Cardiovascular complication will be avoided 12/29/2019 2206 by Mickie Kay, RN Outcome: Progressing 12/29/2019 2205 by Mickie Kay, RN Outcome: Progressing   Problem: Activity: Goal: Risk for activity intolerance will decrease 12/29/2019 2206 by Mickie Kay, RN Outcome: Progressing 12/29/2019 2205 by Mickie Kay, RN Outcome: Progressing   Problem: Education: Goal: Ability to demonstrate management of disease process will improve 12/29/2019 2206 by Mickie Kay, RN Outcome: Progressing 12/29/2019 2205 by Mickie Kay, RN Outcome: Progressing Goal: Ability to verbalize understanding of medication therapies will improve 12/29/2019 2206 by Mickie Kay, RN Outcome: Progressing 12/29/2019 2205 by Mickie Kay, RN Outcome: Progressing Goal: Individualized  Educational Video(s) 12/29/2019 2206 by Mickie Kay, RN Outcome: Progressing 12/29/2019 2205 by Mickie Kay, RN Outcome: Progressing   Problem: Activity: Goal: Capacity to carry out activities will improve 12/29/2019 2206 by Mickie Kay, RN Outcome: Progressing 12/29/2019 2205 by Mickie Kay, RN Outcome: Progressing   Problem: Cardiac: Goal: Ability to achieve and maintain adequate cardiopulmonary perfusion will improve 12/29/2019 2206 by Mickie Kay, RN Outcome: Progressing 12/29/2019 2205 by Mickie Kay, RN Outcome: Progressing

## 2019-12-29 NOTE — Plan of Care (Signed)
  Problem: Clinical Measurements: Goal: Will remain free from infection Outcome: Completed/Met   Problem: Nutrition: Goal: Adequate nutrition will be maintained Outcome: Completed/Met   Problem: Skin Integrity: Goal: Risk for impaired skin integrity will decrease Outcome: Completed/Met

## 2019-12-29 NOTE — Progress Notes (Addendum)
Patient ID: Larry [name], male   DOB: 12-Aug-1941, 79 y.o.   MRN: [id number]      Advanced Heart Failure Rounding Note  PCP-Cardiologist: No primary care provider on file.   Subjective:    Admitted with GI bleed. Received 3 U PRBCs total and IV Fe.  Hgb 7.6 => 7.8 => 9.2 => 9.3 => 9.2 => 9.3.  Colonoscopy (3/21) with several polyps removed, diverticulosis, hemorrhoids.  EGD (3/21) with nonbleeding gastric ulcer.    Off diuretics.  Creatinine up 2.19 => 2.4=>2.4    Denies SOB. Hungry wants eggs.   Hemodynamics (mmHg) RHC Procedural Findings RA mean 14 RV 38/11 PA 37/17, mean 25 PCWP mean 15 Oxygen saturations: SVC 54% High RA 60% Low RA 88% PA 88% PV 100% AO 100% Cardiac Output (Fick) 13.6  Cardiac Index (Fick) 6.64 PVR 0.74 WU Shunt fraction 3.7  TEE: Normal LV size and wall thickness.  EF 60-65%, no wall motion abnormalities.  Moderately dilated RV with normal systolic function.  D-shaped interventricular septum suggestive of RV pressure/volume overload.  Moderate left atrial enlargement, no LA appendage thrombus.  Moderate right atrial enlargement.  There is a prominent Eustachian valve.  There is a large secundum ASD with left to right flow, 2.9  X 1.1 cm.  There do appear to be adequate rims for closure. Trivial TR. Mild mitral regurgitation.  Trileaflet aortic valve with no stenosis, trivial regurgitation.   Objective:   Weight Range: 84.1 kg Body mass index is 25.13 kg/m.   Vital Signs:   Temp:  [97.2 F (36.2 C)-98.4 F (36.9 C)] 98.4 F (36.9 C) (03/22 0440) Pulse Rate:  [56-76] 68 (03/22 0440) Resp:  [12-22] 16 (03/22 0440) BP: (85-112)/(51-67) 96/67 (03/22 0440) SpO2:  [95 %-100 %] 95 % (03/22 0440) Weight:  [83.5 kg-84.1 kg] 84.1 kg (03/22 0638) Last BM Date: 12/28/19  Weight change: Filed Weights   12/28/19 0420 12/28/19 0835 12/29/19 6606  Weight: 83.5 kg 83.5 kg 84.1 kg    Intake/Output:   Intake/Output Summary (Last 24 hours) at 12/29/2019  0733 Last data filed at 12/29/2019 0400 Gross per 24 hour  Intake 1000 ml  Output 450 ml  Net 550 ml      Physical Exam    General: No resp difficulty HEENT: normal Neck: supple. JVP 9-10 . Carotids 2+ bilat; no bruits. No lymphadenopathy or thryomegaly appreciated. Cor: PMI nondisplaced. Regular rate & rhythm. No rubs, gallops or murmurs. Lungs: clear Abdomen: soft, nontender, nondistended. No hepatosplenomegaly. No bruits or masses. Good bowel sounds. Extremities: no cyanosis, clubbing, rash, R and LLE with unna boots.  Neuro: alert & orientedx3, cranial nerves grossly intact. moves all 4 extremities w/o difficulty. Affect pleasant   Telemetry   A f lutter 80s   EKG    n/a  Labs    CBC Recent Labs    12/27/19 0415 12/28/19 0608  WBC 14.5* 11.3*  HGB 9.2* 9.3*  HCT 30.6* 30.4*  MCV 89.2 88.1  PLT 328 301   Basic Metabolic Panel Recent Labs    12/27/19 0415 12/28/19 0608  NA 131* 128*  K 4.4 4.5  CL 97* 97*  CO2 20* 17*  GLUCOSE 145* 95  BUN 38* 37*  CREATININE 2.19* 2.41*  CALCIUM 8.5* 8.4*  MG 2.6* 2.5*  PHOS 4.4 4.7*   Liver Function Tests Recent Labs    12/27/19 0415 12/28/19 0608  ALBUMIN 2.8* 2.8*   No results for input(s): LIPASE, AMYLASE in the last 72 hours. Cardiac  Enzymes No results for input(s): CKTOTAL, CKMB, CKMBINDEX, TROPONINI in the last 72 hours.  BNP: BNP (last 3 results) Recent Labs    05/12/19 1200 12/19/19 1404  BNP 469.5* 192.2*    ProBNP (last 3 results) No results for input(s): PROBNP in the last 8760 hours.   D-Dimer No results for input(s): DDIMER in the last 72 hours. Hemoglobin A1C No results for input(s): HGBA1C in the last 72 hours. Fasting Lipid Panel No results for input(s): CHOL, HDL, LDLCALC, TRIG, CHOLHDL, LDLDIRECT in the last 72 hours. Thyroid Function Tests Recent Labs    12/28/19 1204  TSH 5.114*    Other results:   Imaging    No results found.   Medications:     Scheduled  Medications: . amiodarone  400 mg Oral BID  . midodrine  5 mg Oral TID WC  . pantoprazole (PROTONIX) IV  40 mg Intravenous Q12H  . polyethylene glycol-electrolytes  2,000 mL Oral Once  . pravastatin  40 mg Oral QPM  . sodium chloride flush  3 mL Intravenous Q12H    Infusions: . sodium chloride 250 mL (12/25/19 1829)    PRN Medications: sodium chloride, acetaminophen **OR** acetaminophen, ipratropium-albuterol, ondansetron **OR** ondansetron (ZOFRAN) IV     Assessment/Plan   1. Acute on chronic diastolic CHF: With significant component of RV failure.  Echo this admission showed EF 60-65% with D-shaped septum, mild-moderate RV dilation and normal RV systolic function, IVC dilated. On high dose torsemide at home.  RHC suggestive of ASD with high shunt fraction and ASD confirms large secundum ASD.  Exam and RHC suggest ongoing right-sided CHF. Suspect we will not get his CVP down into normal range until the ASD is closed.  Creatinine remains 2.4 >2.4 .  -Volume status stable.  - Continue to hold diuretics, restart home torsemide when creatinine starts trending down.    - He is on midodrine 5 mg tid with soft BP, generally SBP running 90s.   2. CAD: PCI to RCA, had stent thrombosis back in 2000.No chest pain. . - He has not been on ASA given Xarelto use. Would hold for now with GI bleeding.   - Continue pravastatin,good lipids in 9/20. 3. Atrial fibrillation/flutter: Paroxysmal, had been in NSR earlier in his admission but now in atrial flutter with controlled rate.  He had to stop Tikosyn due to long QTc on lowest dose Tikosyn. He is now off Xarelto with GI bleeding.  Large ASD is likely driving the atrial fibrillation/flutter.  - Continue amiodarone po, decrease to 200 mg bid.     - When anticoagulation restarted, would use apixaban as this seems to have somewhat less GI bleeding risk.  - Will not be able to cardiovert without anticoagulation but rate controlled currently.  4. GI  bleeding/microcytic anemia: Fe deficiency. Hgb down to 7.6 in 8/20.  At the time, I recommended GI workup but he refused.  This admission, hgb markedly low and stool heme+. He has had 3 units PRBCs.  He initially refused inpatient GI workup, says he was "too weak."  He got IV Fe.  He had EGD/colonoscopy today, there was a nonbleeding gastric ulcer, several polyps removed, diverticulosis, and hemorrhoids.  Per GI note, 30-40% risk of rebleeding if anticoagulation started in the next 7 days.  - Continue PPI - Hold anticoagulation for 7 more days, will start him on Eliquis at that time if no further overt bleeding.  5. Hyponatremia: Hypervolemic hyponatremia.  Na 128>128 today.   - Fluid  restrict.  6. AKI on CKD stage 3: In setting of GI bleeding and anemia.  Creatinine plateued at 2.4  - Continue to hold diuretics.   7. ASD: Large, hemodynamically significant secundum ASD by RHC and TEE.  PA pressure only mildly elevated.  He will need eventual closure.  - Have asked Dr. Burt Knack to see him for ASD closure.   Remove unna boots and place ted hose.   Amy Clegg NP-C  12/29/2019 7:33 AM   Patient seen with NP, agree with the above note.    He is back in NSR today, continue po amiodarone.   Volume overload on exam, diuretics have been held with worsening renal function.  Stable today at 2.4, will likely restart torsemide tomorrow.   He is off apixaban for 1 week per recommendation from GI.  No active bleeding on scopes.  Hemoglobin stable today with no overt bleeding.   Dr. Burt Knack to see for ASD closure.  Would ideally close this admission while off apixaban.    [name] 12/29/2019 4:22 PM

## 2019-12-29 NOTE — Plan of Care (Signed)
  Problem: Education: Goal: Knowledge of General Education information will improve Description: Including pain rating scale, medication(s)/side effects and non-pharmacologic comfort measures Outcome: Progressing   Problem: Health Behavior/Discharge Planning: Goal: Ability to manage health-related needs will improve Outcome: Progressing   Problem: Clinical Measurements: Goal: Ability to maintain clinical measurements within normal limits will improve Outcome: Progressing Goal: Diagnostic test results will improve Outcome: Progressing Goal: Respiratory complications will improve Outcome: Progressing Goal: Cardiovascular complication will be avoided Outcome: Progressing   Problem: Activity: Goal: Risk for activity intolerance will decrease Outcome: Progressing   Problem: Education: Goal: Ability to demonstrate management of disease process will improve Outcome: Progressing Goal: Ability to verbalize understanding of medication therapies will improve Outcome: Progressing Goal: Individualized Educational Video(s) Outcome: Progressing   Problem: Activity: Goal: Capacity to carry out activities will improve Outcome: Progressing   Problem: Cardiac: Goal: Ability to achieve and maintain adequate cardiopulmonary perfusion will improve Outcome: Progressing

## 2019-12-29 NOTE — Progress Notes (Signed)
PROGRESS NOTE  Hale Chalfin OEV:[id number] DOB: December 09, 1940   PCP: Patient, No Pcp Per  Patient is from: Home.  DOA: 12/19/2019 LOS: 10  Brief Narrative / Interim history: 23-year male with history of coronary artery disease, CKD stage IIIb, Hypertension, chronic diastolic congestive heart failure, atrial flutter, hyperlipidemia who presents with weakness.  Or the last couple of days, he was too weak to get out of the bed.  He also had diarrhea with dark stools.  He follows with cardiology for his atrial flutter.  On presentation, his stool was found to be Hemoccult positive, had AKI, hemoglobin of 5.8, A. fib with RVR.  Cardiology and GI consulted.  Patient and family refused EGD/colonoscopy.  Hospital course significant for hyponatremia.  Cardiology (advanced HF team) and nephrology following.   [place] on 12/25/2019 revealed mild pulmonary venous hypertension, elevated right atrial pressure and possible ASD.  TEE on 3/18 confirmed atrial septal defect.  Per cardiology, patient agreeable to GI evaluation for GI bleed before addressing ASD closure.  GI consulted.  EGD on 3/21 revealed gastritis and nonbleeding gastric ulcer.  Colonoscopy on 3/21 revealed internal hemorrhoids, diverticulosis and polyps.  Polyps resected.  Pathology pending.  Subjective: Seen and examined earlier this morning.  No major events overnight or this morning.  No complaint this morning.  Denies chest pain, dyspnea, palpitation, dizziness, GI or UTI symptoms.  Has had BM yesterday.  Denies melena or hematochezia.  Off diuretics due to AKI.  Creatinine seems to be plateauing.  Objective: Vitals:   12/28/19 2350 12/29/19 0440 12/29/19 0638 12/29/19 1105  BP: 93/63 96/67  (!) 93/59  Pulse: 70 68  73  Resp: 18 16  20   Temp:  98.4 F (36.9 C)  98.4 F (36.9 C)  TempSrc:  Oral  Oral  SpO2:  95%  96%  Weight:   84.1 kg   Height:        Intake/Output Summary (Last 24 hours) at 12/29/2019 1235 Last data filed at  12/29/2019 1033 Gross per 24 hour  Intake 700 ml  Output 800 ml  Net -100 ml   Filed Weights   12/28/19 0420 12/28/19 0835 12/29/19 8299  Weight: 83.5 kg 83.5 kg 84.1 kg    Examination:  GENERAL: No acute distress.  Appears well.  HEENT: MMM.  Vision and hearing grossly intact.  NECK: Supple.  No apparent JVD.  RESP:  No IWOB. Good air movement bilaterally. CVS:  RRR. Heart sounds normal.  ABD/GI/GU: Bowel sounds present. Soft. Non tender.  MSK/EXT:  Moves extremities. No apparent deformity. No edema.  SKIN: no apparent skin lesion or wound NEURO: Awake, alert and oriented appropriately.  No apparent focal neuro deficit. PSYCH: Calm. Normal affect.  Procedures:  3/18-RHC-mild pulmonary venous hypertension, elevated right atrial pressure and possible ASD. 3/18-TEE confirmed atrial septal defect but no LA appendage thrombus. 3/21-EGD gastritis and nonbleeding gastric ulcer.   3/21-colonoscopy with internal hemorrhoids, diverticulosis and polyps.  Polyps resected.   Assessment & Plan: Acute blood loss anemia due to upper GI bleed in the setting of supratherapeutic INR: FOBT positive. Baseline Hgb 10-11> 5.8 (admit)>2u> 7.8>1u> 9.0> 9.4.  Anemia panel consistent with iron deficiency.  EGD and colonoscopy as above. -Received IV Feraheme on 3/15 and 3/18. -Continue PPI. -Goal Hgb greater than 8.0 given history of CAD  Acute on chronic diastolic CHF/RV failure: Echo with EF of 60 to 65% with D-shaped septum, normal RVSF but dilated IVC.  [place] on 3/18 as above.  On high-dose torsemide  at home.  Responded to IV Lasix.  I&O incomplete, only 450 cc urine charted.  Creatinine seems to be plateauing. -Advanced HF team managing-diuretics on hold -On midodrine 5 mg 3 times daily for soft blood pressure.  Atrial septal defect: TEE on 3/18 confirmed ASD with left-to-right flow. -Plan for surgical closure?  A. fib with RVR: Seems to be in NSR.  Previously on Tikosyn that was stopped due to  QTC.  -On amiodarone 200 mg twice daily per cardiology -Xarelto on hold due to GI bleed-plan to resume with Eliquis when able.   -Per GI, 30 to 40% risk of GI rebleed if anticoagulation resumed in the next 7 days from 3/21  CAD s/p PCI to RCA with stent thrombosis in 2000.  No chest pain. -Off Xarelto and ASA due to GI bleed -Continue statin -Transfusion for Hgb less than 8.0  AKI on CKD stage IIIb/azotemia: Baseline Cr 1.5-1.8 > 2.2 (admit) > 2.05 > 2.21> 2.41> 2.44.  Likely a combination of hypotension and diuretics.  Creatinine seems to be plateauing. -Diuretics on hold  Hypervolemic hyponatremia:  In the setting of CHF and CKD.  Baseline Na~130 > 129 (admit) >> 123 > tolvaptan > 129>131> 128.  A.m. cortisol and TFT within normal range. -Fluid restriction  Hypotension: Soft blood pressures. -On midodrine per cardiology.  Hyperlipidemia:  -On Pravachol  Leukocytosis: Likely reactive.  No obvious signs of infection.  Improved. -Continue monitoring  Generalized weakness/debility:  -PT/OT-HH                  DVT prophylaxis: SCD in the setting of GI bleed Code Status: Full code Family Communication: Updated patient's wife and daughter at bedside.  Discharge barrier: Evaluation and treatment of acute on chronic CHF requiring IV diuretics, AKI, GI bleed and atrial septal defect Patient is from: Home Final disposition: Likely home home likely in 3-4 days when stable and cleared by consultants  Consultants: nephrology (off), advanced HF team, GI   Microbiology summarized: COVID-19 negative  Sch Meds:  Scheduled Meds: . amiodarone  400 mg Oral BID  . midodrine  5 mg Oral TID WC  . pantoprazole (PROTONIX) IV  40 mg Intravenous Q12H  . polyethylene glycol-electrolytes  2,000 mL Oral Once  . pravastatin  40 mg Oral QPM  . sodium chloride flush  3 mL Intravenous Q12H   Continuous Infusions: . sodium chloride 250 mL (12/25/19 1829)   PRN Meds:.sodium  chloride, acetaminophen **OR** acetaminophen, ipratropium-albuterol, ondansetron **OR** ondansetron (ZOFRAN) IV  Antimicrobials: Anti-infectives (From admission, onward)   None       I have personally reviewed the following labs and images: CBC: Recent Labs  Lab 12/23/19 0308 12/23/19 0308 12/24/19 0535 12/24/19 1835 12/25/19 0447 12/25/19 6712 12/25/19 0845 12/26/19 0311 12/27/19 0415 12/28/19 0608 12/29/19 0659  WBC 16.6*   < > 13.8*  --  14.4*  --   --  12.2* 14.5* 11.3* 10.7*  NEUTROABS 13.1*  --  10.8*  --   --   --   --   --   --   --   --   HGB 7.6*   < > 7.8*   < > 9.2*   < > 9.9* 9.3* 9.2* 9.3* 9.4*  HCT 24.2*   < > 25.6*   < > 29.9*   < > 29.0* 30.2* 30.6* 30.4* 30.8*  MCV 82.0   < > 83.4  --  86.2  --   --  87.8 89.2 88.1 90.1  PLT 319   < > 364  --  359  --   --  330 328 282 266   < > = values in this interval not displayed.   BMP &GFR Recent Labs  Lab 12/25/19 0447 12/25/19 0811 12/25/19 0845 12/26/19 0311 12/27/19 0415 12/28/19 0608 12/29/19 0659  NA 129*   < > 130* 130* 131* 128* 128*  K 4.5   < > 4.2 4.6 4.4 4.5 4.5  CL 95*  --   --  97* 97* 97* 96*  CO2 20*  --   --  20* 20* 17* 18*  GLUCOSE 117*  --   --  98 145* 95 84  BUN 51*  --   --  44* 38* 37* 39*  CREATININE 2.17*  --   --  2.00* 2.19* 2.41* 2.44*  CALCIUM 8.3*  --   --  8.5* 8.5* 8.4* 8.4*  MG 2.7*  --   --  2.8* 2.6* 2.5* 2.7*  PHOS  --   --   --  3.9 4.4 4.7* 4.6   < > = values in this interval not displayed.   Estimated Creatinine Clearance: 28.3 mL/min (A) (by C-G formula based on SCr of 2.44 mg/dL (H)). Liver & Pancreas: Recent Labs  Lab 12/25/19 0447 12/26/19 0311 12/27/19 0415 12/28/19 0608 12/29/19 0659  AST 17  --   --   --   --   ALT 13  --   --   --   --   ALKPHOS 134*  --   --   --   --   BILITOT 3.1*  --   --   --   --   PROT 5.8*  --   --   --   --   ALBUMIN 2.7* 2.7* 2.8* 2.8* 2.8*   No results for input(s): LIPASE, AMYLASE in the last 168 hours. No  results for input(s): AMMONIA in the last 168 hours. Diabetic: No results for input(s): HGBA1C in the last 72 hours. No results for input(s): GLUCAP in the last 168 hours. Cardiac Enzymes: No results for input(s): CKTOTAL, CKMB, CKMBINDEX, TROPONINI in the last 168 hours. No results for input(s): PROBNP in the last 8760 hours. Coagulation Profile: No results for input(s): INR, PROTIME in the last 168 hours. Thyroid Function Tests: Recent Labs    12/28/19 1204  TSH 5.114*  FREET4 1.07   Lipid Profile: No results for input(s): CHOL, HDL, LDLCALC, TRIG, CHOLHDL, LDLDIRECT in the last 72 hours. Anemia Panel: No results for input(s): VITAMINB12, FOLATE, FERRITIN, TIBC, IRON, RETICCTPCT in the last 72 hours. Urine analysis:    Component Value Date/Time   COLORURINE YELLOW 03/23/2018 [place] 03/23/2018 0727   LABSPEC 1.009 03/23/2018 0727   PHURINE 5.0 03/23/2018 0727   GLUCOSEU NEGATIVE 03/23/2018 0727   HGBUR NEGATIVE 03/23/2018 0727   BILIRUBINUR NEGATIVE 03/23/2018 0727   KETONESUR NEGATIVE 03/23/2018 0727   PROTEINUR NEGATIVE 03/23/2018 0727   NITRITE NEGATIVE 03/23/2018 0727   LEUKOCYTESUR NEGATIVE 03/23/2018 0727   Sepsis Labs: Invalid input(s): PROCALCITONIN, [place]  Microbiology: Recent Results (from the past 240 hour(s))  SARS CORONAVIRUS 2 (TAT 6-24 HRS) Nasopharyngeal Nasopharyngeal Swab     Status: None   Collection Time: 12/19/19  1:53 PM   Specimen: Nasopharyngeal Swab  Result Value Ref Range Status   SARS Coronavirus 2 NEGATIVE NEGATIVE Final    Comment: (NOTE) SARS-CoV-2 target nucleic acids are NOT DETECTED. The SARS-CoV-2 RNA is generally detectable in  upper and lower respiratory specimens during the acute phase of infection. Negative results do not preclude SARS-CoV-2 infection, do not rule out co-infections with other pathogens, and should not be used as the sole basis for treatment or other patient management  decisions. Negative results must be combined with clinical observations, patient history, and epidemiological information. The expected result is Negative. Fact Sheet for Patients: SugarRoll.be Fact Sheet for Healthcare Providers: https://www.woods-mathews.com/ This test is not yet approved or cleared by the [place] FDA and  has been authorized for detection and/or diagnosis of SARS-CoV-2 by FDA under an Emergency Use Authorization (EUA). This EUA will remain  in effect (meaning this test can be used) for the duration of the COVID-19 declaration under Section 56 4(b)(1) of the Act, 21 U.S.C. section 360bbb-3(b)(1), unless the authorization is terminated or revoked sooner. Performed at [facility], [place] [address] [place], [place] [address]     Radiology Studies: No results found.   Janayla Marik T. [place]  If 7PM-7AM, please contact night-coverage [email or web address] Password [facility] 12/29/2019, 12:35 PM

## 2019-12-30 DIAGNOSIS — I50810 Right heart failure, unspecified: Secondary | ICD-10-CM

## 2019-12-30 DIAGNOSIS — I959 Hypotension, unspecified: Secondary | ICD-10-CM

## 2019-12-30 DIAGNOSIS — R5381 Other malaise: Secondary | ICD-10-CM

## 2019-12-30 DIAGNOSIS — E7800 Pure hypercholesterolemia, unspecified: Secondary | ICD-10-CM

## 2019-12-30 LAB — CBC
HCT: 31.7 % — ABNORMAL LOW (ref 39.0–52.0)
Hemoglobin: 9.7 g/dL — ABNORMAL LOW (ref 13.0–17.0)
MCH: 27.4 pg (ref 26.0–34.0)
MCHC: 30.6 g/dL (ref 30.0–36.0)
MCV: 89.5 fL (ref 80.0–100.0)
Platelets: 255 10*3/uL (ref 150–400)
RBC: 3.54 MIL/uL — ABNORMAL LOW (ref 4.22–5.81)
RDW: 23.5 % — ABNORMAL HIGH (ref 11.5–15.5)
WBC: 13.1 10*3/uL — ABNORMAL HIGH (ref 4.0–10.5)
nRBC: 0 % (ref 0.0–0.2)

## 2019-12-30 LAB — RENAL FUNCTION PANEL
Albumin: 2.9 g/dL — ABNORMAL LOW (ref 3.5–5.0)
Anion gap: 13 (ref 5–15)
BUN: 39 mg/dL — ABNORMAL HIGH (ref 8–23)
CO2: 18 mmol/L — ABNORMAL LOW (ref 22–32)
Calcium: 8.3 mg/dL — ABNORMAL LOW (ref 8.9–10.3)
Chloride: 96 mmol/L — ABNORMAL LOW (ref 98–111)
Creatinine, Ser: 2.44 mg/dL — ABNORMAL HIGH (ref 0.61–1.24)
GFR calc Af Amer: 28 mL/min — ABNORMAL LOW (ref >60)
GFR calc non Af Amer: 25 mL/min — ABNORMAL LOW (ref >60)
Glucose, Bld: 95 mg/dL (ref 70–99)
Phosphorus: 3.7 mg/dL (ref 2.5–4.6)
Potassium: 4.4 mmol/L (ref 3.5–5.1)
Sodium: 127 mmol/L — ABNORMAL LOW (ref 135–145)

## 2019-12-30 LAB — SURGICAL PATHOLOGY

## 2019-12-30 LAB — MAGNESIUM: Magnesium: 2.6 mg/dL — ABNORMAL HIGH (ref 1.7–2.4)

## 2019-12-30 MED ORDER — FERROUS SULFATE 325 (65 FE) MG PO TABS: 325.0000 mg | ORAL_TABLET | Freq: Two times a day (BID) | ORAL | 1 refills | Status: AC

## 2019-12-30 MED ORDER — PANTOPRAZOLE SODIUM 40 MG PO TBEC: 40.0000 mg | DELAYED_RELEASE_TABLET | Freq: Two times a day (BID) | ORAL | 1 refills | Status: AC

## 2019-12-30 MED ORDER — MIDODRINE HCL 5 MG PO TABS: 5.0000 mg | ORAL_TABLET | Freq: Three times a day (TID) | ORAL | 1 refills | Status: AC

## 2019-12-30 MED ORDER — POLYETHYLENE GLYCOL 3350 17 GM/SCOOP PO POWD: 17.0000 g | Freq: Two times a day (BID) | ORAL | 1 refills | Status: AC | PRN

## 2019-12-30 MED ORDER — TORSEMIDE 20 MG PO TABS: 20.0000 mg | ORAL_TABLET | Freq: Every day | ORAL | Status: DC

## 2019-12-30 MED ORDER — TORSEMIDE 20 MG PO TABS: 80.0000 mg | ORAL_TABLET | Freq: Every day | ORAL | 1 refills | Status: DC

## 2019-12-30 MED ORDER — APIXABAN 5 MG PO TABS: 5.0000 mg | ORAL_TABLET | Freq: Two times a day (BID) | ORAL | 1 refills | Status: AC

## 2019-12-30 MED ORDER — AMIODARONE HCL 200 MG PO TABS: 200.0000 mg | ORAL_TABLET | Freq: Two times a day (BID) | ORAL | 1 refills | Status: AC

## 2019-12-30 MED ORDER — TORSEMIDE 20 MG PO TABS
80.0000 mg | ORAL_TABLET | Freq: Every day | ORAL | Status: DC
  Administered 2019-12-30: 80 mg via ORAL
  Filled 2019-12-30: qty 4

## 2019-12-30 MED FILL — MIDODRINE HCL 5 MG TABLET: 5 | 30 days supply | Qty: 90 | Fill #0 | Status: TO

## 2019-12-30 MED FILL — FERROUS SULFATE 325 MG TAB: 325 (65 FE) | 50 days supply | Qty: 100 | Fill #0 | Status: TO

## 2019-12-30 MED FILL — ELIQUIS 5 MG TABLET: 5 | 30 days supply | Qty: 60 | Fill #0 | Status: TO

## 2019-12-30 MED FILL — PANTOPRAZOLE SOD DR 40 MG T: 40 | 90 days supply | Qty: 180 | Fill #0 | Status: TO

## 2019-12-30 MED FILL — TORSEMIDE 20 MG TABLET: 20 | 90 days supply | Qty: 360 | Fill #0 | Status: TO

## 2019-12-30 MED FILL — AMIODARONE HCL 200 MG TAB: 200 | 90 days supply | Qty: 180 | Fill #0 | Status: TO

## 2019-12-30 NOTE — Care Management Important Message (Signed)
Important Message  Patient Details  Name: Larry [name] MRN: [id number] Date of Birth: 1940-10-21   Medicare Important Message Given:  Yes     Shelda Altes 12/30/2019, 10:45 AM

## 2019-12-30 NOTE — Progress Notes (Signed)
Removed TED hose per pt request. Stated he would put them back on in am but "just can't stand them another minute". This nurse educated pt on the risks/benefits of TED hose.

## 2019-12-30 NOTE — Progress Notes (Addendum)
Patient ID: Larry [name], male   DOB: 12-23-1940, 79 y.o.   MRN: [id number]      Advanced Heart Failure Rounding Note  PCP-Cardiologist: No primary care provider on file.   Subjective:    Admitted with GI bleed. Received 3 U PRBCs total and IV Fe.  Hgb 7.6 => 7.8 => 9.2 => 9.3 => 9.2 => 9.3=>9.7.  Colonoscopy (3/21) with several polyps removed, diverticulosis, hemorrhoids.  EGD (3/21) with nonbleeding gastric ulcer.    Off diuretics.  Creatinine up 2.19 => 2.4=>2.4  =>2.4   Wants to go home. Denies SOB.   Hemodynamics (mmHg) RHC Procedural Findings RA mean 14 RV 38/11 PA 37/17, mean 25 PCWP mean 15 Oxygen saturations: SVC 54% High RA 60% Low RA 88% PA 88% PV 100% AO 100% Cardiac Output (Fick) 13.6  Cardiac Index (Fick) 6.64 PVR 0.74 WU Shunt fraction 3.7  TEE: Normal LV size and wall thickness.  EF 60-65%, no wall motion abnormalities.  Moderately dilated RV with normal systolic function.  D-shaped interventricular septum suggestive of RV pressure/volume overload.  Moderate left atrial enlargement, no LA appendage thrombus.  Moderate right atrial enlargement.  There is a prominent Eustachian valve.  There is a large secundum ASD with left to right flow, 2.9  X 1.1 cm.  There do appear to be adequate rims for closure. Trivial TR. Mild mitral regurgitation.  Trileaflet aortic valve with no stenosis, trivial regurgitation.   Objective:   Weight Range: 84.4 kg Body mass index is 25.23 kg/m.   Vital Signs:   Temp:  [97.3 F (36.3 C)-98.4 F (36.9 C)] 97.3 F (36.3 C) (03/23 0351) Pulse Rate:  [68-74] 68 (03/23 0351) Resp:  [18-20] 18 (03/23 0351) BP: (93-98)/(59-66) 98/66 (03/23 0351) SpO2:  [93 %-96 %] 94 % (03/23 0351) Weight:  [84.4 kg] 84.4 kg (03/23 0351) Last BM Date: 12/28/19  Weight change: Filed Weights   12/28/19 0835 12/29/19 0638 12/30/19 0351  Weight: 83.5 kg 84.1 kg 84.4 kg    Intake/Output:   Intake/Output Summary (Last 24 hours) at 12/30/2019  0721 Last data filed at 12/30/2019 0400 Gross per 24 hour  Intake 420 ml  Output 650 ml  Net -230 ml      Physical Exam    General:  Well appearing. No resp difficulty HEENT: normal Neck: supple. JVP 9-10 . Carotids 2+ bilat; no bruits. No lymphadenopathy or thryomegaly appreciated. Cor: PMI nondisplaced. Regular rate & rhythm. No rubs, gallops or murmurs. Lungs: clear Abdomen: soft, nontender, nondistended. No hepatosplenomegaly. No bruits or masses. Good bowel sounds. Extremities: no cyanosis, clubbing, rash, R and LLE 1+ edema Neuro: alert & orientedx3, cranial nerves grossly intact. moves all 4 extremities w/o difficulty. Affect pleasant   Telemetry   NSR 70s personally reviewed.   EKG    n/a  Labs    CBC Recent Labs    12/29/19 0659 12/30/19 0401  WBC 10.7* 13.1*  HGB 9.4* 9.7*  HCT 30.8* 31.7*  MCV 90.1 89.5  PLT 266 035   Basic Metabolic Panel Recent Labs    12/29/19 0659 12/30/19 0401  NA 128* 127*  K 4.5 4.4  CL 96* 96*  CO2 18* 18*  GLUCOSE 84 95  BUN 39* 39*  CREATININE 2.44* 2.44*  CALCIUM 8.4* 8.3*  MG 2.7* 2.6*  PHOS 4.6 3.7   Liver Function Tests Recent Labs    12/29/19 0659 12/30/19 0401  ALBUMIN 2.8* 2.9*   No results for input(s): LIPASE, AMYLASE in the last 72  hours. Cardiac Enzymes No results for input(s): CKTOTAL, CKMB, CKMBINDEX, TROPONINI in the last 72 hours.  BNP: BNP (last 3 results) Recent Labs    05/12/19 1200 12/19/19 1404  BNP 469.5* 192.2*    ProBNP (last 3 results) No results for input(s): PROBNP in the last 8760 hours.   D-Dimer No results for input(s): DDIMER in the last 72 hours. Hemoglobin A1C No results for input(s): HGBA1C in the last 72 hours. Fasting Lipid Panel No results for input(s): CHOL, HDL, LDLCALC, TRIG, CHOLHDL, LDLDIRECT in the last 72 hours. Thyroid Function Tests Recent Labs    12/28/19 1204  TSH 5.114*    Other results:   Imaging    No results  found.   Medications:     Scheduled Medications: . amiodarone  400 mg Oral BID  . midodrine  5 mg Oral TID WC  . pantoprazole (PROTONIX) IV  40 mg Intravenous Q12H  . polyethylene glycol-electrolytes  2,000 mL Oral Once  . pravastatin  40 mg Oral QPM  . sodium chloride flush  3 mL Intravenous Q12H    Infusions: . sodium chloride 250 mL (12/25/19 1829)    PRN Medications: sodium chloride, acetaminophen **OR** acetaminophen, ipratropium-albuterol, ondansetron **OR** ondansetron (ZOFRAN) IV     Assessment/Plan   1. Acute on chronic diastolic CHF: With significant component of RV failure.  Echo this admission showed EF 60-65% with D-shaped septum, mild-moderate RV dilation and normal RV systolic function, IVC dilated. On high dose torsemide at home.  RHC suggestive of ASD with high shunt fraction and ASD confirms large secundum ASD.  Exam and RHC suggest ongoing right-sided CHF. Suspect we will not get his CVP down into normal range until the ASD is closed.  Creatinine remains 2.4. Unchanged for the last 72 hours.  Restart torsemide 20 mg daily.  - He is on midodrine 5 mg tid with soft BP, generally SBP running 90s.   2. CAD: PCI to RCA, had stent thrombosis back in 2000.No chest pain. . - He has not been on ASA given Xarelto use. Would hold for now with GI bleeding.   - Continue pravastatin,good lipids in 9/20. 3. Atrial fibrillation/flutter: Paroxysmal, had been in atrial flutter with controlled rate.  He had to stop Tikosyn due to long QTc on lowest dose Tikosyn. He is now off Xarelto with GI bleeding.  Large ASD is likely driving the atrial fibrillation/flutter.  - Today he is back in NSR.  - Continue amiodarone po, decrease to 200 mg bid.     - When anticoagulation restarted, would use apixaban as this seems to have somewhat less GI bleeding risk. . Start eliquis 5 mg twice a day on 01/04/20 4. GI bleeding/microcytic anemia: Fe deficiency. Hgb down to 7.6 in 8/20.  At the  time, I recommended GI workup but he refused.  This admission, hgb markedly low and stool heme+. He has had 3 units PRBCs.  He initially refused inpatient GI workup, says he was "too weak."  He got IV Fe.  He had EGD/colonoscopy today, there was a nonbleeding gastric ulcer, several polyps removed, diverticulosis, and hemorrhoids.  Per GI note, 30-40% risk of rebleeding if anticoagulation started in the next 7 days.  - Continue PPI - Hold anticoagulation for 6  more days, will start him on Eliquis at that time if no further overt bleeding. Start eliquis 5 mg twice a day on 01/04/20 5. Hyponatremia: Hypervolemic hyponatremia.  Na 127 today  - Fluid restrict.  6. AKI on  CKD stage 3: In setting of GI bleeding and anemia.  Creatinine plateued at 2.4  - Restart torsemide today.    7. ASD: Large, hemodynamically significant secundum ASD by RHC and TEE.  PA pressure only mildly elevated.  He will need eventual closure.  - Have asked Dr. Burt Knack to see him for ASD closure. Plan to set up once he is back on anticoagulants.   HF follow up set up. 4/5 at 1:30   Amiodarone 200 mg twice a day x 10 days then 200 mg daily.  Torsemide 80 mg daily Midodrine 5 mg three times a day  Start eliquis 5 mg twice a day on 01/04/20 Pravastatin 40 mg daily     Amy Clegg NP-C  12/30/2019 7:21 AM   Patient seen with NP, agree with the above note.   I think he can go home today.  He remains in NSR.  Mild volume overload on exam, but as mentioned before, we will not be able to lower CVP to normal range until ASD is closed.  Restart torsemide at 80 mg daily today.   Plan to followup with Dr. Burt Knack regarding ASD closure in the near future (saw him last night).    Plan to start Eliquis 5 mg bid back on 01/04/20.   He is back in NSR today, continue amiodarone as in NP's note above.  We have arranged followup in CHF clinic.   [name] 12/30/2019 8:59 AM

## 2019-12-30 NOTE — TOC Benefit Eligibility Note (Signed)
Transition of Care [facility]) Benefit Eligibility Note    Patient Details  Name: Symir Mah MRN: [id number] Date of Birth: 03-28-1941   Medication/Dose: Eliquis 2.05 mg 5mg   Covered?: Yes  Tier: 3 Drug  Prescription Coverage Preferred Pharmacy: Any retail Pharmacy  Spoke with Person/Company/Phone Number:: Jarold Motto [phone]  Co-Pay: 45.00 for a 30 day supply Retail 90.00 day supply Mail Order  Prior Approval: No  Deductible: (No Deductible)       Orbie Pyo Phone Number: 12/30/2019, 3:22 PM

## 2019-12-30 NOTE — Progress Notes (Signed)
Occupational Therapy Treatment Patient Details Name: Larry [name] MRN: [id number] DOB: [birth year] Today's Date: 12/30/2019    History of present illness Pt adm with weakness and found to have upper GI bleed, AKI, and afib with RVR. PMH - cad, ckd, htn, chf, a flutter   OT comments  Patient reports little sleep last night due to LE cramping, is fatigued this morning but agreeable to getting up to chair for breakfast. Patient is modified I with bed mobility HOB elevated and increase time for trunk elevation. Patient require min A to power up to standing, recommend use of walker for transfer due to unsteadiness pt state "I'll be fine." Patient hand held assist x1 for stability and min cues for body mechanics to transfer to recliner. Pt reports hopefully going home today, address pt/family concerns/questions regarding going home and they politely declined any questions at this time.    Follow Up Recommendations  Home health OT;Supervision/Assistance - 24 hour    Equipment Recommendations  None recommended by OT       Precautions / Restrictions Precautions Precautions: Fall Precaution Comments: watch 02 and HR Restrictions Weight Bearing Restrictions: No       Mobility Bed Mobility Overal bed mobility: Needs Assistance Bed Mobility: Sit to Supine     Supine to sit: Modified independent (Device/Increase time);HOB elevated     General bed mobility comments: increased time for trunk elevation and scooting towards edge of bed  Transfers Overall transfer level: Needs assistance Equipment used: 1 person hand held assist Transfers: Sit to/from Stand Sit to Stand: Min assist         General transfer comment: assist to power up to standing with cues for body mechanics, recommend use of walker for greater stability vs HHA (pt declined using walker)    Balance Overall balance assessment: Needs assistance Sitting-balance support: Feet supported;No upper extremity supported Sitting  balance-Leahy Scale: Good     Standing balance support: Single extremity supported;During functional activity Standing balance-Leahy Scale: Poor Standing balance comment: Requires UE support                           ADL either performed or assessed with clinical judgement   ADL Overall ADL's : Needs assistance/impaired                         Toilet Transfer: Minimal assistance;Ambulation;Cueing for safety;BSC Toilet Transfer Details (indicate cue type and reason): simulated with functional mobility, hand held assist x1, cues for body mechanics         Functional mobility during ADLs: Minimal assistance;Cueing for safety(hand held assist) General ADL Comments: patient reports didn't sleep well last night, declined further ADLs but agreeable to getting up to the chair for breakfast               Cognition Arousal/Alertness: Awake/alert Behavior During Therapy: WFL for tasks assessed/performed Overall Cognitive Status: Within Functional Limits for tasks assessed                                                     Pertinent Vitals/ Pain       Pain Assessment: Faces Faces Pain Scale: No hurt         Frequency  Min 3X/week  Progress Toward Goals  OT Goals(current goals can now be found in the care plan section)  Progress towards OT goals: Progressing toward goals  Acute Rehab OT Goals Patient Stated Goal: to go home Time For Goal Achievement: 01/06/20 Potential to Achieve Goals: Good ADL Goals Pt Will Perform Grooming: with modified independence;standing Pt Will Perform Lower Body Bathing: with modified independence;sit to/from stand Pt Will Perform Lower Body Dressing: with modified independence;sit to/from stand Pt Will Transfer to Toilet: with modified independence;ambulating;regular height toilet Pt Will Perform Toileting - Clothing Manipulation and hygiene: with modified independence;sit to/from stand Pt  Will Perform Tub/Shower Transfer: with min guard assist;grab bars Additional ADL Goal #1: Pt to tolerate standing up to 5 min with modified independence, in preparation for ADLs. Additional ADL Goal #2: Pt to recall and verbalize 3 fall prevention strategies with 0 verbal cues.  Plan Discharge plan remains appropriate       AM-PAC OT "6 Clicks" Daily Activity     Outcome Measure   Help from another person eating meals?: None Help from another person taking care of personal grooming?: A Little Help from another person toileting, which includes using toliet, bedpan, or urinal?: A Little Help from another person bathing (including washing, rinsing, drying)?: A Little Help from another person to put on and taking off regular upper body clothing?: A Little Help from another person to put on and taking off regular lower body clothing?: A Little 6 Click Score: 19    End of Session  OT Visit Diagnosis: Unsteadiness on feet (R26.81);Muscle weakness (generalized) (M62.81)   Activity Tolerance Patient limited by fatigue   Patient Left in chair;with call bell/phone within reach;with family/visitor present           Time: 7425-9563 OT Time Calculation (min): 13 min  Charges: OT General Charges $OT Visit: 1 Visit OT Treatments $Self Care/Home Management : 8-22 mins  [place] OT office: [place] 12/30/2019, 7:56 AM

## 2019-12-30 NOTE — Discharge Summary (Signed)
Physician Discharge Summary  Larry [name] FYB:[id number] DOB: 07/28/1941 DOA: 12/19/2019  PCP: Patient, No Pcp Per  Admit date: 12/19/2019 Discharge date: 12/30/2019  Admitted From: Home Disposition: Home   Recommendations for Outpatient Follow-up:  1. Follow ups as below. 2. Please obtain CBC/BMP/Mag at follow up 3. Please follow up on the following pending results: None  Home Health: [facility] PT Equipment/Devices: None  Discharge Condition: Stable CODE STATUS: Full code  Follow-up Information    [facility] Follow up on 01/15/2020.   Specialty: Cardiology Why: at 1:30 Heart Failure [facility]  Contact information: [address] [phone] [place] [facility] Course: 32-year male with history of coronary artery disease, CKD stage IIIb, Hypertension, chronic diastolic congestive heart failure, atrial flutter, hyperlipidemia who presents with weakness. Or the last couple of days, he was too weak to get out of the bed. He also had diarrhea with dark stools. He follows with cardiology for his atrial flutter. On presentation, his stool was found to be Hemoccult positive, had AKI, hemoglobin of 5.8, A. fib with RVR. Cardiology and GI consulted. Patient and family refused EGD/colonoscopy. Hospital course significant for hyponatremia.Cardiology (advanced HF team) and nephrology following.   [place] on 12/25/2019 revealed mild pulmonary venous hypertension, elevated right atrial pressure and possible ASD.  TEE on 3/18 confirmed atrial septal defect.  Per cardiology, patient agreeable to GI evaluation for GI bleed before addressing ASD closure.  GI consulted.  EGD on 3/21 revealed gastritis and nonbleeding gastric ulcer.  Colonoscopy on 3/21 revealed internal hemorrhoids, diverticulosis and polyps.  Polyps resected.  Pathology with tubular adenoma but no dysplasia or malignancy.  Patient  remained stable from GI bleed standpoint.  Anticoagulation held at time of discharge and to be resumed with Eliquis 5 mg twice daily on 01/04/2020 if no further signs of bleeding.   Patient was evaluated by structural heart disease for ASD who will arrange outpatient follow-up.  Patient was diuresed with IV Lasix which was held due to AKI.  Creatinine plateaued at 2.44.  Restarted on torsemide 80 mg daily (he is on 140 mg prior to admission).  Cleared for discharge by advanced heart failure team on torsemide 80 mg daily, amiodarone 200 mg twice daily, midodrine 5 mg 3 times daily, pravastatin 40 mg daily and Eliquis starting 01/04/2020.  Home health PT ordered.  See individual problems below for more hospital course.  Discharge Diagnoses:  Acute blood loss anemia due to upper GI bleed in the setting of supratherapeutic RWE:RXVQ positive. Baseline Hgb 10-11> 5.8 (admit)>2u> 7.8>1u> 9.0> 9.7.  Anemia panel consistent with iron deficiency.  EGD and colonoscopy as above. -Received IV Feraheme on 3/15 and 3/18. -P.o. Protonix 40 mg twice daily -Patient to start p.o. iron on 01/08/2020  Acute on chronic diastolic CHF/RV failure: Echo with EF of 60 to 65% with D-shaped septum, normal RVSF but dilated IVC.  [place] on 3/18 as above.  On high-dose torsemide at home.  Responded to IV Lasix which was held due to AKI.  Net -5.2 L since admission.  Weight up from 173-186 which seems to be inaccurate. -Discharged on torsemide 80 mg daily and midodrine 5 mg 3 times daily per advanced heart failure team. -Metoprolol and Aldactone discontinued. -Advanced heart failure team to arrange outpatient follow-up.  Atrial septal defect: TEE on 3/18 confirmed ASD with left-to-right flow. -Outpatient follow-up with cardiology  A. fib with RVR:  Seems to be in NSR.  Previously on Tikosyn that was stopped due to QTC.  -Discharged on amiodarone 200 mg twice daily -Patient to start anticoagulation with Eliquis on  01/04/2020  CAD s/p PCI to RCA with stent thrombosis in 2000.  No chest pain. -Hold Toprol due to hypotension.  Hold aspirin due to GI bleed. -Continue statin  AKI on CKD stage IIIb/azotemia: Baseline Cr 1.5-1.8 > 2.2 (admit) > 2.05 > 2.21> 2.41> 2.44>.  Likely a combination of hypotension and diuretics.  Creatinine plateaued. -Recheck renal function in about 1 to 2 weeks  Hypervolemic hyponatremia: In the setting of CHF and CKD.  Baseline Na~130 > 129 (admit) >> 123 > tolvaptan > 129>131> 128.  A.m. cortisol and TFT within normal range. -Fluid restriction and diuretics  Hypotension: Soft blood pressures. -On midodrine per cardiology.  Hyperlipidemia:  -On Pravachol  Leukocytosis: Likely reactive.  No obvious signs of infection.  Improved. -Recheck CBC at follow-up.  Generalized weakness/debility: -Home meds PT  Family communication: Updated patient's wife and daughter at bedside.  Discharge Instructions  Discharge Instructions    (HEART FAILURE PATIENTS) Call MD:  Anytime you have any of the following symptoms: 1) 3 pound weight gain in 24 hours or 5 pounds in 1 week 2) shortness of breath, with or without a dry hacking cough 3) swelling in the hands, feet or stomach 4) if you have to sleep on extra pillows at night in order to breathe.   Complete by: As directed    Call MD for:  difficulty breathing, headache or visual disturbances   Complete by: As directed    Call MD for:  extreme fatigue   Complete by: As directed    Call MD for:  persistant dizziness or light-headedness   Complete by: As directed    Call MD for:  persistant nausea and vomiting   Complete by: As directed    Diet - low sodium heart healthy   Complete by: As directed    Discharge instructions   Complete by: As directed    It has been a pleasure taking care of you! You were hospitalized with anemia due to gastrointestinal bleed, congestive heart failure exacerbation and acute kidney injury.  You  were treated surgically and medically.  Your hemoglobin remained stable after stopping blood thinner.  We have made adjustments to your home medications particularly your heart medications during this hospitalization. Please review your new medication list and the directions before you take your medications.  Please continue monitoring your stool for signs of bleeding including fresh red blood or increased darkness.  You may start the new blood thinner, Eliquis on 01/04/2020 if no signs of bleeding.  You may start iron pills on 01/08/2020 if no signs of bleeding after starting Eliquis.  The cardiologist office will arrange outpatient follow-up.   Would recommend fluid and sodium restriction to less than 1500 cc (6 cups) and 2000 mg (2 g) a day respectively.  Please avoid any over-the-counter pain medications other than plain Tylenol while taking blood thinner.   Take care,   Increase activity slowly   Complete by: As directed      Allergies as of 12/30/2019      Reactions   Plavix [clopidogrel Bisulfate] Anaphylaxis         Medication List    STOP taking these medications   metoprolol succinate 25 MG 24 hr tablet Commonly known as: Toprol XL   potassium chloride SA 20 MEQ tablet Commonly known as:  KLOR-CON   spironolactone 25 MG tablet Commonly known as: ALDACTONE   Xarelto 20 MG Tabs tablet Generic drug: rivaroxaban     TAKE these medications   acetaminophen 500 MG tablet Commonly known as: TYLENOL Take 1,000 mg by mouth as needed for moderate pain (back pain).   amiodarone 200 MG tablet Commonly known as: PACERONE Take 1 tablet (200 mg total) by mouth 2 (two) times daily.   apixaban 5 MG Tabs tablet Commonly known as: ELIQUIS Take 1 tablet (5 mg total) by mouth 2 (two) times daily. Start taking on: January 04, 2020   ferrous sulfate 325 (65 FE) MG tablet Take 1 tablet (325 mg total) by mouth 2 (two) times daily with a meal. Start taking on: January 08, 2020    loratadine 10 MG tablet Commonly known as: CLARITIN Take 10 mg by mouth daily.   midodrine 5 MG tablet Commonly known as: PROAMATINE Take 1 tablet (5 mg total) by mouth 3 (three) times daily with meals.   nitroGLYCERIN 0.4 MG SL tablet Commonly known as: NITROSTAT Place 1 tablet (0.4 mg total) under the tongue every 5 (five) minutes as needed for chest pain (3 doses max).   pantoprazole 40 MG tablet Commonly known as: Protonix Take 1 tablet (40 mg total) by mouth 2 (two) times daily.   polyethylene glycol powder 17 GM/SCOOP powder Commonly known as: MiraLax Take 17 g by mouth 2 (two) times daily as needed for moderate constipation.   pravastatin 40 MG tablet Commonly known as: PRAVACHOL Take 1 tablet (40 mg total) by mouth every evening.   torsemide 20 MG tablet Commonly known as: DEMADEX Take 4 tablets (80 mg total) by mouth daily. Start taking on: December 31, 2019 What changed: See the new instructions.       Consultations:  Advanced HF team  GI  Structural heart team  Procedures/Studies: 3/18-RHC-mild pulmonary venous hypertension, elevated right atrial pressure and possible ASD. 3/18-TEE confirmed atrial septal defect but no LA appendage thrombus. 3/21-EGD gastritis and nonbleeding gastric ulcer.   3/21-colonoscopy with internal hemorrhoids, diverticulosis and polyps.  Polyps resected.     CARDIAC CATHETERIZATION  Result Date: 12/25/2019 1. Elevated right atrial pressure. 2. Mild pulmonary venous hypertension. 3. Shunt fraction 3.7 with shunt run suggesting significant ASD. He will need a TEE.   US RENAL  Result Date: 12/23/2019 CLINICAL DATA:  Acute kidney injury. EXAM: RENAL / URINARY TRACT ULTRASOUND COMPLETE COMPARISON:  March 23, 2018. FINDINGS: Right Kidney: Renal measurements: 10.7 x 5.4 x 5.7 cm = volume: 73 mL. 3 mm rounded calcification is seen involving the upper pole which may represent large nonobstructive calculus. Echogenicity within normal  limits. No mass or hydronephrosis visualized. Left Kidney: Renal measurements: 9.7 x 5.4 x 5.0 cm = volume: 137 mL. Echogenicity within normal limits. No mass or hydronephrosis visualized. Bladder: Appears normal for degree of bladder distention. Other: Minimal ascites is noted in the left lower quadrant. Moderate prostatic enlargement is noted. IMPRESSION: 3 cm rounded calcification is seen involving upper pole of right kidney which may represent large nonobstructive calculus. CT scan may be performed for further evaluation. No hydronephrosis or renal obstruction is noted. Minimal ascites is noted.  Moderate prostatic enlargement. Electronically Signed   By: Marijo Conception M.D.   On: 12/23/2019 10:32   DG Chest Portable 1 View  Result Date: 12/19/2019 CLINICAL DATA:  Shortness of breath, weakness EXAM: PORTABLE CHEST 1 VIEW COMPARISON:  03/22/2018 FINDINGS: Mild cardiomegaly, unchanged. Calcific aortic knob. Mild  pulmonary vascular congestion. Small right pleural effusion. Probable trace left pleural effusion. No focal airspace consolidation. No pneumothorax. Degenerative changes of the shoulders. IMPRESSION: Cardiomegaly with mild pulmonary vascular congestion and small right pleural effusion. Electronically Signed   By: Davina Poke D.O.   On: 12/19/2019 13:24   ECHOCARDIOGRAM COMPLETE  Result Date: 12/22/2019    ECHOCARDIOGRAM REPORT   Patient Name:   JOVANNE RIGGENBACH Date of Exam: 12/22/2019 Medical Rec #:  [id number]     Height:       72.0 in Accession #:    [id number]    Weight:       182.0 lb Date of Birth:  1941/02/25     BSA:          2.047 m Patient Age:    79 years      BP:           100/62 mmHg Patient Gender: M             HR:           77 bpm. Exam Location:  Inpatient Procedure: 2D Echo, Cardiac Doppler and Color Doppler Indications:    Dyspnea 786.09 / 06.00  History:        Patient has prior history of Echocardiogram examinations, most                 recent 02/22/2018. CAD and Previous  Myocardial Infarction; Risk                 Factors:Hypertension and Dyslipidemia.  Sonographer:    Tiffany Dance Referring Phys: [place]  1. Hypokinesis of the basal septum. Left ventricular ejection fraction, by estimation, is 60 to 65%. The left ventricle has normal function. The left ventricle demonstrates regional wall motion abnormalities (see scoring diagram/findings for description). There is mild concentric left ventricular hypertrophy. Left ventricular diastolic parameters are indeterminate.  2. Right ventricular systolic function is normal. The right ventricular size is normal. There is normal pulmonary artery systolic pressure.  3. Left atrial size was severely dilated.  4. The mitral valve is normal in structure. No evidence of mitral valve regurgitation. No evidence of mitral stenosis.  5. The aortic valve is normal in structure. Aortic valve regurgitation is not visualized. No aortic stenosis is present.  6. The inferior vena cava is dilated in size with >50% respiratory variability, suggesting right atrial pressure of 8 mmHg. FINDINGS  Left Ventricle: Hypokinesis of the basal septum. Left ventricular ejection fraction, by estimation, is 60 to 65%. The left ventricle has normal function. The left ventricle demonstrates regional wall motion abnormalities. The left ventricular internal cavity size was normal in size. There is mild concentric left ventricular hypertrophy. Left ventricular diastolic parameters are indeterminate. The ratio of pulmonic flow to systemic flow (Qp/Qs ratio) is 1.40. Right Ventricle: The right ventricular size is normal. No increase in right ventricular wall thickness. Right ventricular systolic function is normal. There is normal pulmonary artery systolic pressure. The tricuspid regurgitant velocity is 2.20 m/s, and  with an assumed right atrial pressure of 8 mmHg, the estimated right ventricular systolic pressure is 06.3 mmHg. Left Atrium: Left  atrial size was severely dilated. Right Atrium: Right atrial size was normal in size. Pericardium: There is no evidence of pericardial effusion. Mitral Valve: The mitral valve is normal in structure. Normal mobility of the mitral valve leaflets. No evidence of mitral valve regurgitation. No evidence of mitral valve stenosis. Tricuspid Valve: The tricuspid valve  is normal in structure. Tricuspid valve regurgitation is mild . No evidence of tricuspid stenosis. Aortic Valve: The aortic valve is normal in structure. Aortic valve regurgitation is not visualized. No aortic stenosis is present. Pulmonic Valve: The pulmonic valve was normal in structure. Pulmonic valve regurgitation is not visualized. No evidence of pulmonic stenosis. Aorta: The aortic root is normal in size and structure. Venous: The inferior vena cava is dilated in size with greater than 50% respiratory variability, suggesting right atrial pressure of 8 mmHg. IAS/Shunts: No atrial level shunt detected by color flow Doppler. The ratio of pulmonic flow to systemic flow (Qp/Qs ratio) is 1.40.  LEFT VENTRICLE PLAX 2D LVIDd:         4.37 cm  Diastology LVIDs:         2.99 cm  LV e' lateral:   11.60 cm/s LV PW:         1.12 cm  LV E/e' lateral: 8.6 LV IVS:        1.04 cm  LV e' medial:    9.14 cm/s LVOT diam:     2.10 cm  LV E/e' medial:  10.9 LV SV:         65 LV SV Index:   32 LVOT Area:     3.46 cm  RIGHT VENTRICLE             IVC RV Basal diam:  2.92 cm     IVC diam: 2.57 cm RV S prime:     13.90 cm/s RVOT diam:      2.50 cm TAPSE (M-mode): 1.7 cm LEFT ATRIUM             Index       RIGHT ATRIUM           Index LA diam:        5.60 cm 2.74 cm/m  RA Area:     25.20 cm LA Vol (A2C):   87.7 ml 42.85 ml/m RA Volume:   77.50 ml  37.86 ml/m LA Vol (A4C):   89.2 ml 43.58 ml/m LA Biplane Vol: 89.3 ml 43.63 ml/m  AORTIC VALVE             PULMONIC VALVE LVOT Vmax:   103.00 cm/s RVOT Peak grad: 3 mmHg LVOT Vmean:  65.800 cm/s LVOT VTI:    0.189 m  AORTA Ao Root  diam: 3.70 cm Ao Asc diam:  3.50 cm MITRAL VALVE               TRICUSPID VALVE MV Area (PHT): 4.36 cm    TR Peak grad:   19.4 mmHg MV Decel Time: 174 msec    TR Vmax:        220.00 cm/s MV E velocity: 99.50 cm/s                            SHUNTS                            Systemic VTI:  0.19 m                            Systemic Diam: 2.10 cm                            Pulmonic VTI:  0.179 m  Pulmonic Diam: 2.50 cm                            Qp/Qs:         1.34 Skeet Latch MD Electronically signed by Skeet Latch MD Signature Date/Time: 12/22/2019/3:35:58 PM    Final        Discharge Exam: Vitals:   12/30/19 0351 12/30/19 0815  BP: 98/66 109/66  Pulse: 68 77  Resp: 18   Temp: (!) 97.3 F (36.3 C)   SpO2: 94%     GENERAL: No acute distress.  Appears well.  HEENT: MMM.  Vision and hearing grossly intact.  NECK: Supple.  No apparent JVD.  RESP:  No IWOB. Good air movement bilaterally. CVS:  RRR. Heart sounds normal.  ABD/GI/GU: Bowel sounds present. Soft. Non tender.  MSK/EXT:  Moves extremities. No apparent deformity or edema.  SKIN: no apparent skin lesion or wound NEURO: Awake, alert and oriented appropriately.  No apparent focal neuro deficit. PSYCH: Calm. Normal affect.    The results of significant diagnostics from this hospitalization (including imaging, microbiology, ancillary and laboratory) are listed below for reference.     Microbiology: No results found for this or any previous visit (from the past 240 hour(s)).   Labs: BNP (last 3 results) Recent Labs    05/12/19 1200 12/19/19 1404  BNP 469.5* 161.0*   Basic Metabolic Panel: Recent Labs  Lab 12/26/19 0311 12/27/19 0415 12/28/19 0608 12/29/19 0659 12/30/19 0401  NA 130* 131* 128* 128* 127*  K 4.6 4.4 4.5 4.5 4.4  CL 97* 97* 97* 96* 96*  CO2 20* 20* 17* 18* 18*  GLUCOSE 98 145* 95 84 95  BUN 44* 38* 37* 39* 39*  CREATININE 2.00* 2.19* 2.41* 2.44* 2.44*  CALCIUM 8.5*  8.5* 8.4* 8.4* 8.3*  MG 2.8* 2.6* 2.5* 2.7* 2.6*  PHOS 3.9 4.4 4.7* 4.6 3.7   Liver Function Tests: Recent Labs  Lab 12/25/19 0447 12/25/19 0447 12/26/19 0311 12/27/19 0415 12/28/19 0608 12/29/19 0659 12/30/19 0401  AST 17  --   --   --   --   --   --   ALT 13  --   --   --   --   --   --   ALKPHOS 134*  --   --   --   --   --   --   BILITOT 3.1*  --   --   --   --   --   --   PROT 5.8*  --   --   --   --   --   --   ALBUMIN 2.7*   < > 2.7* 2.8* 2.8* 2.8* 2.9*   < > = values in this interval not displayed.   No results for input(s): LIPASE, AMYLASE in the last 168 hours. No results for input(s): AMMONIA in the last 168 hours. CBC: Recent Labs  Lab 12/24/19 0535 12/24/19 1835 12/26/19 0311 12/27/19 0415 12/28/19 0608 12/29/19 0659 12/30/19 0401  WBC 13.8*   < > 12.2* 14.5* 11.3* 10.7* 13.1*  NEUTROABS 10.8*  --   --   --   --   --   --   HGB 7.8*   < > 9.3* 9.2* 9.3* 9.4* 9.7*  HCT 25.6*   < > 30.2* 30.6* 30.4* 30.8* 31.7*  MCV 83.4   < > 87.8 89.2 88.1 90.1 89.5  PLT 364   < >  330 328 282 266 255   < > = values in this interval not displayed.   Cardiac Enzymes: No results for input(s): CKTOTAL, CKMB, CKMBINDEX, TROPONINI in the last 168 hours. BNP: Invalid input(s): POCBNP CBG: No results for input(s): GLUCAP in the last 168 hours. D-Dimer No results for input(s): DDIMER in the last 72 hours. Hgb A1c No results for input(s): HGBA1C in the last 72 hours. Lipid Profile No results for input(s): CHOL, HDL, LDLCALC, TRIG, CHOLHDL, LDLDIRECT in the last 72 hours. Thyroid function studies Recent Labs    12/28/19 1204  TSH 5.114*   Anemia work up No results for input(s): VITAMINB12, FOLATE, FERRITIN, TIBC, IRON, RETICCTPCT in the last 72 hours. Urinalysis    Component Value Date/Time   COLORURINE YELLOW 03/23/2018 0727   APPEARANCEUR CLEAR 03/23/2018 0727   LABSPEC 1.009 03/23/2018 0727   PHURINE 5.0 03/23/2018 0727   GLUCOSEU NEGATIVE 03/23/2018 0727    HGBUR NEGATIVE 03/23/2018 0727   BILIRUBINUR NEGATIVE 03/23/2018 0727   KETONESUR NEGATIVE 03/23/2018 0727   PROTEINUR NEGATIVE 03/23/2018 0727   NITRITE NEGATIVE 03/23/2018 0727   LEUKOCYTESUR NEGATIVE 03/23/2018 0727   Sepsis Labs Invalid input(s): PROCALCITONIN,  WBC,  LACTICIDVEN   Time coordinating discharge: 45 minutes  SIGNED:  Mercy Riding, MD  [facility] 12/30/2019, 10:55 AM  If 7PM-7AM, please contact night-coverage [email or web address] Password [email or web address]

## 2019-12-30 NOTE — TOC Transition Note (Addendum)
Transition of Care [facility]) - CM/SW Discharge Note   Patient Details  Name: Larry [name] MRN: [id number] Date of Birth: 1941-04-05  [facility] CM/SW Contact:  Zenon Mayo, RN Phone Number: 12/30/2019, 12:56 PM   Clinical Narrative:    NCM spoke with patient and wife in the room, she states she does not have a preference for HHPT.  NCM made referral to [place] with [facility].  She states she will check and call this NCM back.  NCM informed wife of this information.  Wife's cell is [phone].  Patient does not have a PCP, wife states she is working on getting a PCP.  [place] with [facility] states she is able to take referral.  NCM called wifed to inform her and also informed of her of co pay for eliquis for refills.   Final next level of care: [place] Barriers to Discharge: No Barriers Identified   Patient Goals and CMS Choice Patient states their goals for this hospitalization and ongoing recovery are:: get better CMS Medicare.gov Compare Post Acute Care list provided to:: Patient Represenative (must comment)(daughter) Choice offered to / list presented to : Spouse  Discharge Placement                       Discharge Plan and Services                  DME Agency: NA       HH Arranged: PT HH Agency: [facility] Date [facility] Agency Contacted: 12/30/19 Time [place]: [phone] Representative spoke with at [place]: [place]  Social Determinants of Health ([place]) Interventions     Readmission Risk Interventions No flowsheet data found.

## 2019-12-31 ENCOUNTER — Telehealth: Payer: Self-pay

## 2019-12-31 NOTE — Telephone Encounter (Signed)
Called to arrange ASD Closure. The patient and his wife, Darrick Grinder, agree to closure 4/22. He will have labs and COVID test at [facility] 4/19.  Per Dr. Burt Knack, apixiban will be held the day before the procedure. Will discuss ASA with Dr. Burt Knack.  Will call the patient and his wife with instructions after reviewing with Dr. Burt Knack.   They were grateful for assistance.

## 2020-01-01 ENCOUNTER — Telehealth (HOSPITAL_COMMUNITY): Payer: Self-pay | Admitting: *Deleted

## 2020-01-01 NOTE — Telephone Encounter (Signed)
  Just discharged from the hospital on 12/30/19   He was started on torsemide 12/30/19 Increase torsemide to 80 mg twice a day for 2 days then back to 80 mg daily.   Kamirah Shugrue NP-C  1:03 PM

## 2020-01-01 NOTE — Telephone Encounter (Signed)
Patient's wife calling back. She states there is a conflict with the appointment.

## 2020-01-01 NOTE — Telephone Encounter (Signed)
Pts wife aware and agreeable with plan.  

## 2020-01-01 NOTE — Telephone Encounter (Signed)
The patient wishes to have COVID test drawn in [place].  Rescheduled COVID test to 4/20 at [facility]. They were grateful for call.

## 2020-01-01 NOTE — Telephone Encounter (Signed)
Patients wife called to report patient is having a lot of swelling in feet and ankles. Pt has not been wearing compression hose but he was advised by [facility] to wear them during the day. Pts wife said she will put them on him this morning. Also patients weight is 185lbs his weight during his last office visit on 2/25 was 172lbs wife states this has been a gradual weight gain over the month. Pt is taking torsemide 80mg  daily.   Routed to Darrick Grinder, NP for advice

## 2020-01-13 DIAGNOSIS — G4733 Obstructive sleep apnea (adult) (pediatric): Secondary | ICD-10-CM | POA: Diagnosis not present

## 2020-01-15 ENCOUNTER — Ambulatory Visit (HOSPITAL_COMMUNITY)
Admit: 2020-01-15 | Discharge: 2020-01-15 | Disposition: A | Discharge status: Home / Self Care | Payer: PPO | Source: Ambulatory Visit | Attending: Cardiology | Admitting: Cardiology

## 2020-01-15 ENCOUNTER — Other Ambulatory Visit: Payer: Self-pay

## 2020-01-15 VITALS — BP 112/76 | HR 79 | Wt 194.8 lb

## 2020-01-15 DIAGNOSIS — Z86010 Personal history of colonic polyps: Secondary | ICD-10-CM | POA: Diagnosis not present

## 2020-01-15 DIAGNOSIS — I2510 Atherosclerotic heart disease of native coronary artery without angina pectoris: Secondary | ICD-10-CM | POA: Insufficient documentation

## 2020-01-15 DIAGNOSIS — I25118 Atherosclerotic heart disease of native coronary artery with other forms of angina pectoris: Secondary | ICD-10-CM

## 2020-01-15 DIAGNOSIS — D508 Other iron deficiency anemias: Secondary | ICD-10-CM | POA: Diagnosis not present

## 2020-01-15 DIAGNOSIS — Q211 Atrial septal defect: Secondary | ICD-10-CM | POA: Diagnosis not present

## 2020-01-15 DIAGNOSIS — R600 Localized edema: Secondary | ICD-10-CM | POA: Diagnosis not present

## 2020-01-15 DIAGNOSIS — I5022 Chronic systolic (congestive) heart failure: Secondary | ICD-10-CM

## 2020-01-15 DIAGNOSIS — G4733 Obstructive sleep apnea (adult) (pediatric): Secondary | ICD-10-CM | POA: Diagnosis not present

## 2020-01-15 DIAGNOSIS — I252 Old myocardial infarction: Secondary | ICD-10-CM | POA: Diagnosis not present

## 2020-01-15 DIAGNOSIS — Z87891 Personal history of nicotine dependence: Secondary | ICD-10-CM | POA: Insufficient documentation

## 2020-01-15 DIAGNOSIS — I5033 Acute on chronic diastolic (congestive) heart failure: Secondary | ICD-10-CM | POA: Diagnosis not present

## 2020-01-15 DIAGNOSIS — Z955 Presence of coronary angioplasty implant and graft: Secondary | ICD-10-CM | POA: Insufficient documentation

## 2020-01-15 DIAGNOSIS — I130 Hypertensive heart and chronic kidney disease with heart failure and stage 1 through stage 4 chronic kidney disease, or unspecified chronic kidney disease: Secondary | ICD-10-CM | POA: Insufficient documentation

## 2020-01-15 DIAGNOSIS — Z79899 Other long term (current) drug therapy: Secondary | ICD-10-CM | POA: Diagnosis not present

## 2020-01-15 DIAGNOSIS — Z8249 Family history of ischemic heart disease and other diseases of the circulatory system: Secondary | ICD-10-CM | POA: Insufficient documentation

## 2020-01-15 DIAGNOSIS — N1832 Chronic kidney disease, stage 3b: Secondary | ICD-10-CM

## 2020-01-15 DIAGNOSIS — N183 Chronic kidney disease, stage 3 unspecified: Secondary | ICD-10-CM | POA: Insufficient documentation

## 2020-01-15 DIAGNOSIS — Z888 Allergy status to other drugs, medicaments and biological substances status: Secondary | ICD-10-CM | POA: Diagnosis not present

## 2020-01-15 DIAGNOSIS — D649 Anemia, unspecified: Secondary | ICD-10-CM | POA: Diagnosis not present

## 2020-01-15 DIAGNOSIS — Z7901 Long term (current) use of anticoagulants: Secondary | ICD-10-CM | POA: Insufficient documentation

## 2020-01-15 DIAGNOSIS — I480 Paroxysmal atrial fibrillation: Secondary | ICD-10-CM | POA: Diagnosis not present

## 2020-01-15 DIAGNOSIS — I5032 Chronic diastolic (congestive) heart failure: Secondary | ICD-10-CM | POA: Diagnosis present

## 2020-01-15 LAB — CBC
HCT: 37 % — ABNORMAL LOW (ref 39.0–52.0)
Hemoglobin: 11.6 g/dL — ABNORMAL LOW (ref 13.0–17.0)
MCH: 29.1 pg (ref 26.0–34.0)
MCHC: 31.4 g/dL (ref 30.0–36.0)
MCV: 93 fL (ref 80.0–100.0)
Platelets: 232 10*3/uL (ref 150–400)
RBC: 3.98 MIL/uL — ABNORMAL LOW (ref 4.22–5.81)
RDW: 24.8 % — ABNORMAL HIGH (ref 11.5–15.5)
WBC: 9.9 10*3/uL (ref 4.0–10.5)
nRBC: 0 % (ref 0.0–0.2)

## 2020-01-15 LAB — BRAIN NATRIURETIC PEPTIDE: B Natriuretic Peptide: 607.6 pg/mL — ABNORMAL HIGH (ref 0.0–100.0)

## 2020-01-15 LAB — BASIC METABOLIC PANEL
Anion gap: 15 (ref 5–15)
BUN: 53 mg/dL — ABNORMAL HIGH (ref 8–23)
CO2: 23 mmol/L (ref 22–32)
Calcium: 8.8 mg/dL — ABNORMAL LOW (ref 8.9–10.3)
Chloride: 94 mmol/L — ABNORMAL LOW (ref 98–111)
Creatinine, Ser: 2.54 mg/dL — ABNORMAL HIGH (ref 0.61–1.24)
GFR calc Af Amer: 27 mL/min — ABNORMAL LOW (ref >60)
GFR calc non Af Amer: 23 mL/min — ABNORMAL LOW (ref >60)
Glucose, Bld: 114 mg/dL — ABNORMAL HIGH (ref 70–99)
Potassium: 3.6 mmol/L (ref 3.5–5.1)
Sodium: 132 mmol/L — ABNORMAL LOW (ref 135–145)

## 2020-01-15 MED ORDER — TORSEMIDE 20 MG PO TABS: ORAL_TABLET | ORAL | 5 refills | Status: DC

## 2020-01-15 NOTE — Progress Notes (Signed)
Date:  01/15/2020   ID:  Larry [name], DOB Jul 15, 1941, MRN [id number]  Provider location: [address], [place] Type of Visit: Established patient  PCP:  Delphina Cahill  Cardiologist:  No primary care provider on file. Primary HF: Dr. Aundra Dubin   History of Present Illness: Larry [name] is a 79 y.o. male who has a history of CAD, persistent atrial fibrillation, and chronic diastolic CHF.  Patient had initial PCI to RCA in 11/1998.  This was complicated by stent thrombosis with inferior STEMI in 08/1999 requiring PCI.   In 2017, he was noted to be in atrial fibrillation persistently.  He decided not to undergo cardioversion.  Echo in 5/19 showed EF 50-55%, severe biatrial enlargement.   He was doing well until around 5/19. At that time, he noted his HR running higher and he developed peripheral edema.  He would "give out" much more easily.  No chest pain, just felt a severe lack of energy. He would get short of breath walking short distances.  He was started on torsemide.  He was admitted in 6/19 for diuresis (acute/chronic diastolic CHF).    He was started on Tikosyn and went back into NSR. At a prior appointment with me, he was noted to be in atrial fibrillation with prolonged QT interval.  I discussed with Dr. Rayann Heman and decreased Tikosyn to 125 mcg bid.  By 11/15/18, he was back in NSR, QTc ok.  In early 3/20, he noted low BP and lightheadedness.  I stopped his metoprolol and spironolactone.    In August CBC was checked, hgb was 7.6.  Torsemide was also increased due to concern for volume overload.  He has not had melena or BRBPR.  GI referral was recommended, he has never had a colonoscopy.  However, he declined this.  He thinks that his blood loss came from extensive bruising from a fall that he had had.  He was started on oral iron.  Hemoglobin as trended up, it was 11.3 in 9/20.   Admitted with volume overload. Hospital course complicated by anemia and A fib. Hgb dropped so GI  consulted. Had scope that showed several polyps, diverticulosis and hemrrhoids. EGD showed non bleeding ulcer. Also had RHC that showed ASD. Dr Burt Knack consulted with plans to close on 01/09/2020. Discharge weight 186 pounds.   Today he returns for HF follow up.Overall feeling fair. Has started picking up lots leg edema over the last few days. SOB with moderate exertion. Denies PND/Orthopnea. No bleeding issues. Appetite ok. No fever or chills. Weight at home has been going up to 196 pounds. Taking all medications.    Labs (7/19): K 4.2, creatinine 1.25 => 1.3, NT-proBNP 3260, hgb 16.3 Labs (8/19): K 3.9, creatinine 1.26, LDL 54 Labs (1/20): pro-BNP 1439, K 4.4, creatinine 1.3 Labs (2/20): K 4.3, creatinine 1.5, myeloma panel negative  Labs (8/20): K 3.6, creatinine 1.5, hgb 7.6, TSH normal, BNP 470 Labs (9/20): hgb 11.3, K 4.1, creatinine 1.56, LDL 57, HDL 28  PMH: 1. Atrial fibrillation: Now on Tikosyn.  2. HTN 3. OSA: Uses CPAP.  4. CAD: PCI to RCA in 11/1998.  - Stent thrombosis with inferior STEMI and PCI RCA in 08/1999.  5. Chronic diastolic CHF: Echo (5/63) with EF 50-55%, severe biatrial enlargement.  - 6/19 CHF exacerbation, hospitalized.   6. CKD: Stage 3.  7. RHC 12/2019: RA 14 CO 13.6 CI 6.64, PVR 0.74 WU Shunt fraction 3.7  8. TEE 3/21 EF 60-65%  9.  Colonoscopy (3/21) with several polyps removed, diverticulosis, hemorrhoids.  EGD (3/21) with nonbleeding gastric ulcer.  .   Past Surgical History:  Procedure Laterality Date  . CARDIAC CATHETERIZATION  12/15/1999   EF was 55%   . COLONOSCOPY WITH PROPOFOL N/A 12/28/2019   Procedure: COLONOSCOPY WITH PROPOFOL;  Surgeon: Arta Silence, MD;  Location: [facility];  Service: Endoscopy;  Laterality: N/A;  . CORONARY ANGIOPLASTY WITH STENT PLACEMENT  08/15/1999   CAD, status post prior stenting of the mid to distal RCA in 11/1998, with an acute diaphragmatic wall infarction due to total occlusion at the stent site/There is also 70%  narrowing in the diagonal branch of the LAD/The LV showed inferior wall hypo- akinesis.-- Successful reperfusion, percutaneous transluminal coronary percutaneous transluminal coronary & placement of a 2nd overlying stent in RCA  . ESOPHAGOGASTRODUODENOSCOPY (EGD) WITH PROPOFOL N/A 12/28/2019   Procedure: ESOPHAGOGASTRODUODENOSCOPY (EGD) WITH PROPOFOL;  Surgeon: Arta Silence, MD;  Location: [facility];  Service: Endoscopy;  Laterality: N/A;  . POLYPECTOMY  12/28/2019   Procedure: POLYPECTOMY;  Surgeon: Arta Silence, MD;  Location: [facility] ENDOSCOPY;  Service: Endoscopy;;  . RIGHT HEART CATH N/A 12/25/2019   Procedure: RIGHT HEART CATH;  Surgeon: Larey Dresser, MD;  Location: [facility] CV [facility];  Service: Cardiovascular;  Laterality: N/A;  . TEE WITHOUT CARDIOVERSION N/A 12/25/2019   Procedure: TRANSESOPHAGEAL ECHOCARDIOGRAM (TEE);  Surgeon: Larey Dresser, MD;  Location: [facility] ENDOSCOPY;  Service: Cardiovascular;  Laterality: N/A;     Current Outpatient Medications  Medication Sig Dispense Refill  . acetaminophen (TYLENOL) 500 MG tablet Take 1,000 mg by mouth as needed for moderate pain (back pain).     Marland Kitchen amiodarone (PACERONE) 200 MG tablet Take 1 tablet (200 mg total) by mouth 2 (two) times daily. 180 tablet 1  . apixaban (ELIQUIS) 5 MG TABS tablet Take 1 tablet (5 mg total) by mouth 2 (two) times daily. 180 tablet 1  . ferrous sulfate 325 (65 FE) MG tablet Take 1 tablet (325 mg total) by mouth 2 (two) times daily with a meal. 180 tablet 1  . loratadine (CLARITIN) 10 MG tablet Take 10 mg by mouth daily as needed for allergies or itching.     . midodrine (PROAMATINE) 5 MG tablet Take 1 tablet (5 mg total) by mouth 3 (three) times daily with meals. 270 tablet 1  . nitroGLYCERIN (NITROSTAT) 0.4 MG SL tablet Place 1 tablet (0.4 mg total) under the tongue every 5 (five) minutes as needed for chest pain (3 doses max). 25 tablet 3  . pantoprazole (PROTONIX) 40 MG tablet Take 1 tablet (40 mg total) by  mouth 2 (two) times daily. 180 tablet 1  . polyethylene glycol powder (MIRALAX) 17 GM/SCOOP powder Take 17 g by mouth 2 (two) times daily as needed for moderate constipation. 255 g 1  . pravastatin (PRAVACHOL) 40 MG tablet Take 1 tablet (40 mg total) by mouth every evening. 90 tablet 0  . torsemide (DEMADEX) 20 MG tablet Take 4 tablets (80 mg total) by mouth daily. 360 tablet 1   No current facility-administered medications for this encounter.    Allergies:   Plavix [clopidogrel bisulfate]   Social History:  The patient  reports that he quit smoking about 20 years ago. His smoking use included cigarettes. He has never used smokeless tobacco. He reports that he does not drink alcohol or use drugs.   Family History:  The patient's family history includes Cancer in his father; Heart Problems in his mother.  ROS:  Please see the history of present illness.   All other systems are personally reviewed and negative.   Exam:   BP 112/76   Pulse 79   Wt 88.4 kg   SpO2 98%   BMI 26.42 kg/m   Wt Readings from Last 3 Encounters:  01/15/20 88.4 kg  12/30/19 84.4 kg  12/19/19 76.8 kg   General: Arrived in a wheel chair. No resp difficulty HEENT: normal Neck: supple.JVP to jaw. Prominent CV waves. Carotids 2+ bilat; no bruits. No lymphadenopathy or thryomegaly appreciated. Cor: PMI nondisplaced. Regular rate & rhythm. No rubs, gallops or murmurs. Lungs: clear Abdomen: Edema noted in abdomen, nontender, distended. No hepatosplenomegaly. No bruits or masses. Good bowel sounds. Extremities: no cyanosis, clubbing, rash, R and LLE 3+ edema Neuro: alert & orientedx3, cranial nerves grossly intact. moves all 4 extremities w/o difficulty. Affect pleasant   EKG: NSR 79 bpm Qtc 431 ms   Recent Labs: 12/19/2019: B Natriuretic Peptide 192.2 12/25/2019: ALT 13 12/28/2019: TSH 5.114 12/30/2019: BUN 39; Creatinine, Ser 2.44; Hemoglobin 9.7; Magnesium 2.6; Platelets 255; Potassium 4.4; Sodium 127    Personally reviewed   Wt Readings from Last 3 Encounters:  01/15/20 88.4 kg  12/30/19 84.4 kg  12/19/19 76.8 kg     ASSESSMENT AND PLAN:  1. Acute/Chronic diastolic CHF: Echo 03/6062 showed EF 60-65% with D-shaped septum, mild-moderate RV dilation and normal RV systolic function, IVC dilated.On high dose torsemide at home.  RHC suggestive of ASD with high shunt fraction and ASD confirms large secundum ASD. Hansen 12/2019 suggest ongoing right-sided CHF. - NYHA III. Marked volume overload.  - Refer to [facility] for IV lasix . Will need 80 mg IV lasix daily for 2 days then he will go back to to torsemide 80 mg/40 mg  - Continue spironolactone 12.5 mg daily. He is intolerant higher dose.   - Myeloma panel was negative.  - Check BMET/BNP today.   2. CAD: PCI to RCA, had stent thrombosis back in 2000.   No chest pain. - No aspirin with eliquis.  - Continue pravastatin, good lipids in 9/20.   3. Atrial fibrillation: Paroxysmal.  He had to stop Tikosyn due to long QTc on lowest dose Tikosyn. He is now off Xarelto with GI bleeding.  Large ASD is likely driving the atrial fibrillation/flutter. - In NSR today.   - Continue amiodarone 200 mg twice a day. Will cut back if in NSR next week.  - Continue Toprol XL 25 mg daily.  -Continue  eliquis 5 mg twice a day on 01/04/20  4. Anemia  Hgb down to 7.6 in 8/20.At the time, Dr Aundra Dubin recommended GI workup but he refused. Most recent admission, hgb markedly low and stool heme+. He has had 3 units PRBCs. He got IV Fe.  He had EGD/colonoscopy today, there was a nonbleeding gastric ulcer, several polyps removed, diverticulosis, and hemorrhoids.   - No further bleeding. Check CBC today.  - Continue PPI  5. ASD Plan for closure on 01/26/2020 by Dr Burt Knack.   Today I have referred him to [facility]. I  Personally called and discussed with Dr Tarri Abernethy.  Follow up in in 7 days to reassess volume status. Will need to check BMET BNP and EKG at that  time.   He is intolerant unna boots but may be able to use ace wraps for mild compression.   Greater than 50% of the (total minutes 40) visit spent in counseling/coordination of care regarding the above.  Jeanmarie Hubert, NP  01/15/2020   [facility] [address] [facility] and [place] [address] [phone] (office) [phone] (fax)

## 2020-01-15 NOTE — Patient Instructions (Addendum)
The team from [facility] will communicate with you regarding plan/treatment.   HOLD Torsemide on Thurs and Friday.  STARTING Saturday, INCREASE Torsemide to 80mg  (4 tabs) in the morning AND 40mg  (2 tabs) in the afternoon   Your physician recommends that you schedule a follow-up appointment in: 1 week with the Nurse Practitioner and 2 weeks with Dr Aundra Dubin.    Please call office at [phone] option [phone] if you have any questions or concerns.    At the [facility], you and your health needs are our priority. As part of our continuing mission to provide you with exceptional heart care, we have created designated Provider Care Teams. These Care Teams include your primary Cardiologist (physician) and Advanced Practice Providers (APPs- Physician Assistants and Nurse Practitioners) who all work together to provide you with the care you need, when you need it.   You may see any of the following providers on your designated Care Team at your next follow up: Marland Kitchen Dr Glori Bickers . Dr [name] . Darrick Grinder, NP . Lyda Jester, PA . Audry Riles, PharmD   Please be sure to bring in all your medications bottles to every appointment.

## 2020-01-17 ENCOUNTER — Other Ambulatory Visit
Admission: RE | Admit: 2020-01-17 | Discharge: 2020-01-17 | Disposition: A | Discharge status: Home / Self Care | Payer: PPO | Source: Ambulatory Visit | Attending: Adult Health | Admitting: Adult Health

## 2020-01-17 DIAGNOSIS — I509 Heart failure, unspecified: Secondary | ICD-10-CM | POA: Diagnosis not present

## 2020-01-17 DIAGNOSIS — I5022 Chronic systolic (congestive) heart failure: Secondary | ICD-10-CM | POA: Diagnosis not present

## 2020-01-17 DIAGNOSIS — I10 Essential (primary) hypertension: Secondary | ICD-10-CM | POA: Diagnosis not present

## 2020-01-17 LAB — BASIC METABOLIC PANEL
Anion gap: 14 (ref 5–15)
BUN: 56 mg/dL — ABNORMAL HIGH (ref 8–23)
CO2: 24 mmol/L (ref 22–32)
Calcium: 8.5 mg/dL — ABNORMAL LOW (ref 8.9–10.3)
Chloride: 92 mmol/L — ABNORMAL LOW (ref 98–111)
Creatinine, Ser: 2.53 mg/dL — ABNORMAL HIGH (ref 0.61–1.24)
GFR calc Af Amer: 27 mL/min — ABNORMAL LOW (ref >60)
GFR calc non Af Amer: 24 mL/min — ABNORMAL LOW (ref >60)
Glucose, Bld: 100 mg/dL — ABNORMAL HIGH (ref 70–99)
Potassium: 3.4 mmol/L — ABNORMAL LOW (ref 3.5–5.1)
Sodium: 130 mmol/L — ABNORMAL LOW (ref 135–145)

## 2020-01-17 LAB — CBC
HCT: 36.7 % — ABNORMAL LOW (ref 39.0–52.0)
Hemoglobin: 11.7 g/dL — ABNORMAL LOW (ref 13.0–17.0)
MCH: 29.6 pg (ref 26.0–34.0)
MCHC: 31.9 g/dL (ref 30.0–36.0)
MCV: 92.9 fL (ref 80.0–100.0)
Platelets: 197 10*3/uL (ref 150–400)
RBC: 3.95 MIL/uL — ABNORMAL LOW (ref 4.22–5.81)
RDW: 24.4 % — ABNORMAL HIGH (ref 11.5–15.5)
WBC: 9.1 10*3/uL (ref 4.0–10.5)
nRBC: 0 % (ref 0.0–0.2)

## 2020-01-19 DIAGNOSIS — Z7689 Persons encountering health services in other specified circumstances: Secondary | ICD-10-CM | POA: Diagnosis not present

## 2020-01-19 DIAGNOSIS — I10 Essential (primary) hypertension: Secondary | ICD-10-CM | POA: Diagnosis not present

## 2020-01-19 NOTE — Telephone Encounter (Signed)
Discussed with Dr. Burt Knack. The patient will start ASA 81 mg now and HOLD ELIQUIS the night before and morning of his ASD closure.  Will write letter and review with patient.

## 2020-01-21 NOTE — Telephone Encounter (Signed)
Called to touch base with the patient. Spoke to his wife, Esperanza. The patient has an appointment with Dr. Aundra Dubin tomorrow and will have repeat blood work. She states the patient is still having fluid problems and wants to make sure he is stable before having ASD closure. She understands Dr. Burt Knack will check with Dr. Aundra Dubin after visit tomorrow and they will be called on Monday to finalize plans for 4/22 ASD closure. She was grateful for assistance.

## 2020-01-22 ENCOUNTER — Encounter (HOSPITAL_COMMUNITY): Payer: Self-pay

## 2020-01-22 ENCOUNTER — Ambulatory Visit (HOSPITAL_COMMUNITY)
Admission: RE | Admit: 2020-01-22 | Discharge: 2020-01-22 | Disposition: A | Discharge status: Home / Self Care | Payer: PPO | Source: Ambulatory Visit | Attending: Cardiology | Admitting: Cardiology

## 2020-01-22 ENCOUNTER — Telehealth (HOSPITAL_COMMUNITY): Payer: Self-pay | Admitting: Cardiology

## 2020-01-22 ENCOUNTER — Other Ambulatory Visit: Payer: Self-pay

## 2020-01-22 VITALS — BP 100/70 | HR 78 | Wt 187.8 lb

## 2020-01-22 DIAGNOSIS — I252 Old myocardial infarction: Secondary | ICD-10-CM | POA: Diagnosis not present

## 2020-01-22 DIAGNOSIS — I110 Hypertensive heart disease with heart failure: Secondary | ICD-10-CM | POA: Diagnosis not present

## 2020-01-22 DIAGNOSIS — Z955 Presence of coronary angioplasty implant and graft: Secondary | ICD-10-CM | POA: Insufficient documentation

## 2020-01-22 DIAGNOSIS — I5033 Acute on chronic diastolic (congestive) heart failure: Secondary | ICD-10-CM | POA: Diagnosis not present

## 2020-01-22 DIAGNOSIS — Q211 Atrial septal defect, unspecified: Secondary | ICD-10-CM

## 2020-01-22 DIAGNOSIS — I2510 Atherosclerotic heart disease of native coronary artery without angina pectoris: Secondary | ICD-10-CM | POA: Diagnosis not present

## 2020-01-22 DIAGNOSIS — Z87891 Personal history of nicotine dependence: Secondary | ICD-10-CM | POA: Insufficient documentation

## 2020-01-22 DIAGNOSIS — D649 Anemia, unspecified: Secondary | ICD-10-CM | POA: Insufficient documentation

## 2020-01-22 DIAGNOSIS — I5032 Chronic diastolic (congestive) heart failure: Secondary | ICD-10-CM

## 2020-01-22 DIAGNOSIS — Z7901 Long term (current) use of anticoagulants: Secondary | ICD-10-CM | POA: Insufficient documentation

## 2020-01-22 DIAGNOSIS — Z79899 Other long term (current) drug therapy: Secondary | ICD-10-CM | POA: Insufficient documentation

## 2020-01-22 DIAGNOSIS — I50812 Chronic right heart failure: Secondary | ICD-10-CM | POA: Diagnosis not present

## 2020-01-22 DIAGNOSIS — I480 Paroxysmal atrial fibrillation: Secondary | ICD-10-CM | POA: Diagnosis not present

## 2020-01-22 DIAGNOSIS — E7800 Pure hypercholesterolemia, unspecified: Secondary | ICD-10-CM | POA: Diagnosis not present

## 2020-01-22 LAB — BASIC METABOLIC PANEL
Anion gap: 16 — ABNORMAL HIGH (ref 5–15)
BUN: 65 mg/dL — ABNORMAL HIGH (ref 8–23)
CO2: 27 mmol/L (ref 22–32)
Calcium: 9 mg/dL (ref 8.9–10.3)
Chloride: 88 mmol/L — ABNORMAL LOW (ref 98–111)
Creatinine, Ser: 2.7 mg/dL — ABNORMAL HIGH (ref 0.61–1.24)
GFR calc Af Amer: 25 mL/min — ABNORMAL LOW (ref >60)
GFR calc non Af Amer: 22 mL/min — ABNORMAL LOW (ref >60)
Glucose, Bld: 132 mg/dL — ABNORMAL HIGH (ref 70–99)
Potassium: 2.9 mmol/L — ABNORMAL LOW (ref 3.5–5.1)
Sodium: 131 mmol/L — ABNORMAL LOW (ref 135–145)

## 2020-01-22 LAB — CBC
HCT: 37.6 % — ABNORMAL LOW (ref 39.0–52.0)
Hemoglobin: 11.8 g/dL — ABNORMAL LOW (ref 13.0–17.0)
MCH: 29.4 pg (ref 26.0–34.0)
MCHC: 31.4 g/dL (ref 30.0–36.0)
MCV: 93.8 fL (ref 80.0–100.0)
Platelets: 202 10*3/uL (ref 150–400)
RBC: 4.01 MIL/uL — ABNORMAL LOW (ref 4.22–5.81)
RDW: 24.1 % — ABNORMAL HIGH (ref 11.5–15.5)
WBC: 10.6 10*3/uL — ABNORMAL HIGH (ref 4.0–10.5)
nRBC: 0 % (ref 0.0–0.2)

## 2020-01-22 MED ORDER — TORSEMIDE 20 MG PO TABS: 80.0000 mg | ORAL_TABLET | Freq: Every day | ORAL | 5 refills | Status: AC

## 2020-01-22 NOTE — H&P (View-Only) (Signed)
[facility] Note   Referring Physician: PCP: Celene Squibb, MD PCP-Cardiologist: Dr. Aundra Dubin  HPI:   Mr Larry [name] is a 79 year old with a history of CAD, persistent atrial fibrillation, and chronic diastolic CHF. Patient had initial PCI to RCA in 11/1998. This was complicated by stent thrombosis with inferior STEMI in 08/1999 requiring PCI. In 2017, he was noted to be in atrial fibrillation persistently. He decided not to undergo cardioversion. Echo in 5/19 showed EF 50-55%, severe biatrial enlargement.   He was doing well until around 5/19. At that time, he noted his HR running higher and he developed peripheral edema. He would "give out" much more easily. No chest pain, just felt a severe lack of energy. He would get short of breath walking short distances. He was started on torsemide. He was admitted in 6/19 for diuresis (acute/chronic diastolic CHF).   He was started on Tikosyn and went back into NSR.At a prior appointment with Dr. Aundra Dubin, he was noted to be in atrial fibrillation with prolonged QT interval.  This was discussed with Dr. Rayann Heman and we decreased Tikosyn to 125 mcg bid.  By 11/15/18, he was back in NSR, QTc ok.  In early 3/20, he noted low BP and lightheadedness.  His metoprolol and spironolactone were both discontinued.    In August 2020, CBC was checked, hgb was 7.6.  Torsemide was also increased due to concern for volume overload.  He had not had melena or BRBPR.  GI referral was recommended, he has never had a colonoscopy.  However, he declined this.  He thinks that his blood loss came from extensive bruising from a fall that he had had.  He was started on oral iron.  Hemoglobin had improved on supp Fe, up to 11.3 in 9/20.   He was recently admitted 3/21 w/ volume overload. Hospital course complicated by anemia and A fib. Hgb dropped so GI consulted. Had scope that showed several polyps, diverticulosis and hemrrhoids. EGD showed non bleeding ulcer.  Also had RHC that showed ASD. Dr Burt Knack consulted with plans to close on 02/01/2020. Discharge weight 186 pounds.   Had initial post hospital f/u on 4/8 and was noted to be NYHA Class III w/ marked volume overload. He was referred to [facility] for IV Lasix, with instructions to give 80 mg IV Lasix x 2 days, then return to PO torsemide 80 mg qam/ 40 mg qpm.   He presents back to clinic today for f/u. Here with his wife. He tells me that he is feeling much better. Breathing improved. No resting dyspnea. Does ok around the house w/o much symptoms but continues w/ bilateral LEE. Reports poor compliance, suprisingly, to IV Lasix. Feels he does better w/ PO torsemide. He reports that [facility] also gave him a dose of metolazone on 4/12 which resulted in significant urine output. He has 2+ pitting LE edema on exam today but not wearing compression wraps. He states he removed last night to "give his legs a rest". EKG today shows NSR. HR 78 bpm. Denies abnormal bleeding w/ Eliquis. BP 100/70 but c/w his usual baseline and he is asymptomatic. He is scheduled for ASD closure a week from today. His wt today is 187 lb.    Review of Systems: [y] = yes, [ ]  = no   General: Weight gain [ ] ; Weight loss [ ] ; Anorexia [ ] ; Fatigue [ ] ; Fever [ ] ; Chills [ ] ; Weakness [ ]   Cardiac: Chest  pain/pressure [ ] ; Resting SOB [ ] ; Exertional SOB [ ] ; Orthopnea [ ] ; Pedal Edema [ ] ; Palpitations [ ] ; Syncope [ ] ; Presyncope [ ] ; Paroxysmal nocturnal dyspnea[ ]   Pulmonary: Cough [ ] ; Wheezing[ ] ; Hemoptysis[ ] ; Sputum [ ] ; Snoring [ ]   GI: Vomiting[ ] ; Dysphagia[ ] ; Melena[ ] ; Hematochezia [ ] ; Heartburn[ ] ; Abdominal pain [ ] ; Constipation [ ] ; Diarrhea [ ] ; BRBPR [ ]   GU: Hematuria[ ] ; Dysuria [ ] ; Nocturia[ ]   Vascular: Pain in legs with walking [ ] ; Pain in feet with lying flat [ ] ; Non-healing sores [ ] ; Stroke [ ] ; TIA [ ] ; Slurred speech [ ] ;  Neuro: Headaches[ ] ; Vertigo[ ] ; Seizures[ ] ; Paresthesias[ ] ;Blurred  vision [ ] ; Diplopia [ ] ; Vision changes [ ]   Ortho/Skin: Arthritis [ ] ; Joint pain [ ] ; Muscle pain [ ] ; Joint swelling [ ] ; Back Pain [ ] ; Rash [ ]   Psych: Depression[ ] ; Anxiety[ ]   Heme: Bleeding problems [ ] ; Clotting disorders [ ] ; Anemia [ ]   Endocrine: Diabetes [ ] ; Thyroid dysfunction[ ]    Past Medical History:  Diagnosis Date  . Coronary artery disease    status post remote PCI of the mid to distal RCA 11/1998 followed by acute ST elevation MI inferiorly 08/1999 with occlusion of the prior RCA stent as well as 70% diagonal branch.  He underwent PCI of the RCA  . Hypercholesterolemia   . Hypertension   . Myocardial infarct ([place])   . Olecranon bursitis     Current Outpatient Medications  Medication Sig Dispense Refill  . acetaminophen (TYLENOL) 500 MG tablet Take 1,000 mg by mouth as needed for moderate pain (back pain).     Marland Kitchen amiodarone (PACERONE) 200 MG tablet Take 1 tablet (200 mg total) by mouth 2 (two) times daily. 180 tablet 1  . apixaban (ELIQUIS) 5 MG TABS tablet Take 1 tablet (5 mg total) by mouth 2 (two) times daily. 180 tablet 1  . ferrous sulfate 325 (65 FE) MG tablet Take 1 tablet (325 mg total) by mouth 2 (two) times daily with a meal. 180 tablet 1  . loratadine (CLARITIN) 10 MG tablet Take 10 mg by mouth daily as needed for allergies or itching.     . midodrine (PROAMATINE) 5 MG tablet Take 1 tablet (5 mg total) by mouth 3 (three) times daily with meals. 270 tablet 1  . nitroGLYCERIN (NITROSTAT) 0.4 MG SL tablet Place 1 tablet (0.4 mg total) under the tongue every 5 (five) minutes as needed for chest pain (3 doses max). 25 tablet 3  . pantoprazole (PROTONIX) 40 MG tablet Take 1 tablet (40 mg total) by mouth 2 (two) times daily. 180 tablet 1  . polyethylene glycol powder (MIRALAX) 17 GM/SCOOP powder Take 17 g by mouth 2 (two) times daily as needed for moderate constipation. 255 g 1  . potassium chloride SA (KLOR-CON) 20 MEQ tablet Take 20 mEq by mouth daily.    .  pravastatin (PRAVACHOL) 40 MG tablet Take 1 tablet (40 mg total) by mouth every evening. 90 tablet 0  . torsemide (DEMADEX) 20 MG tablet Take 4 tablets (80 mg total) by mouth daily. 240 tablet 5   No current facility-administered medications for this encounter.    Allergies  Allergen Reactions  . Plavix [Clopidogrel Bisulfate] Anaphylaxis           Social History   Socioeconomic History  . Marital status: Married    Spouse name: scarlett  . Number of children:  3  . Years of education: 18  . Highest education level: Not on file  Occupational History  . Occupation: retired  Tobacco Use  . Smoking status: Former Smoker    Types: Cigarettes    Quit date: 11/02/1999    Years since quitting: 20.2  . Smokeless tobacco: Never Used  Substance and Sexual Activity  . Alcohol use: No  . Drug use: No  . Sexual activity: Not on file  Other Topics Concern  . Not on file  Social History Narrative  . Not on file   Social Determinants of Health   Financial Resource Strain:   . Difficulty of Paying Living Expenses:   Food Insecurity:   . Worried About Charity fundraiser in the Last Year:   . Arboriculturist in the Last Year:   Transportation Needs:   . Film/video editor (Medical):   Marland Kitchen Lack of Transportation (Non-Medical):   Physical Activity:   . Days of Exercise per Week:   . Minutes of Exercise per Session:   Stress:   . Feeling of Stress :   Social Connections:   . Frequency of Communication with Friends and Family:   . Frequency of Social Gatherings with Friends and Family:   . Attends Religious Services:   . Active Member of Clubs or Organizations:   . Attends Archivist Meetings:   Marland Kitchen Marital Status:   Intimate Partner Violence:   . Fear of Current or Ex-Partner:   . Emotionally Abused:   Marland Kitchen Physically Abused:   . Sexually Abused:       Family History  Problem Relation Age of Onset  . Heart Problems Mother   . Cancer Father     Vitals:    01/22/20 1048  BP: 100/70  Pulse: 78  SpO2: 98%  Weight: 85.2 kg (187 lb 12.8 oz)     PHYSICAL EXAM: General:  Well appearing elderly WM. No respiratory difficulty HEENT: normal Neck: supple. Elevated JVD to jawline. Carotids 2+ bilat; no bruits. No lymphadenopathy or thyromegaly appreciated. Cor: PMI nondisplaced. Regular rate & rhythm. No rubs, gallops or murmurs. Lungs: clear Abdomen: soft, nontender, nondistended. No hepatosplenomegaly. No bruits or masses. Good bowel sounds. Extremities: no cyanosis, clubbing, rash, 2+ bilateral LE edema Neuro: alert & oriented x 3, cranial nerves grossly intact. moves all 4 extremities w/o difficulty. Affect pleasant.  ECG: NSR, 78 bpm    ASSESSMENT & PLAN:  1.Acute on chronic diastolic HER:DEYC significant component of RV failure. Echo 3/21 showed EF 60-65% with D-shaped septum, mild-moderate RV dilation and normal RV systolic function, IVC dilated. RHC 3/1 suggestive of ASD with high shunt fraction and ASD confirms large secundum ASD (scheduled for closure by Dr. Burt Knack next week).   - no significant dyspnea but 2+ bilateral LEE, consistent w/ R>L sided heart failure - needs to improve compliance w/ unna boots. Will plan to rewrap legs when he returns home. [facility] to continue to follow. Also encouraged to elevate legs  - continue torsemide 80/40 for now. Will check BMP today to check SCr/ K. If stable, may further increase PM dose for a few days (will follow result first, need to ensure SCr and K stable given recent metolazone) - he is off  blocker due to hypotension and is on midodrine 5 mg tid. Will continue      2. CAD: s/p remote PCI to RCA, had stent thrombosis back in 2000. - denies CP  - No aspirin with  eliquis.  - Continue pravastatin, good lipids in 9/20.   3. Atrial fibrillation: Paroxysmal.  - He had to stop Tikosyn due to long QTc on lowest dose Tikosyn. He is now off Xarelto with GI bleeding. Changed to Eliquis.  Large ASD is likely driving the atrial fibrillation/flutter. - In NSR today.  HR controlled.  - Continue amiodarone>> reduce dose down to 200 mg daily.  - No longer on metoprolol due to hypotension. Now on midodrine  - Continue Eliquis 5 mg twice. Check CBC today   4. Anemia  Hgb down to 7.6 in 8/20.At the time, Dr Aundra Dubin recommended GI workup but he refused. Most recent admission, hgb markedly low and stool heme+. He has had 3 units PRBCs. He got IV Fe. He had EGD/colonoscopy 3/21, there was a nonbleeding gastric ulcer, several polyps removed, diverticulosis, and hemorrhoids.  - off Xarelto. Now on Eliquis.  - He denies further bleeding. Check CBC today.  - Continue PPI  5. ASD - Plan for closure on 01/24/2020 by Dr Burt Knack.    Keep f/u w/ Dr Aundra Dubin next month, post ASD closure   [place], [place] 01/22/20

## 2020-01-22 NOTE — Telephone Encounter (Signed)
[phone] (M) Pt aware and voiced understanding via wife    Georgena Spurling  01/22/2020 3:48 PM EDT    SCr higher at 2.7 and K low at 2.9.  Hold tonight's' dose of torsemide. Resume back tomorrow but change back to just 80 mg once daily. Take 60 Meq of KCl this evening.  See if [facility] can repeat BMP tomorrow and fax results to our office. They should hold off on metolazone for now.  Make sure he also wraps his legs w/ compression wraps and elevated to help with edema.

## 2020-01-22 NOTE — Patient Instructions (Signed)
Labs done today, your results will be available in MyChart, we will contact you for abnormal readings.  Wrap your legs when you get home today  Keep your appointment with Dr Aundra Dubin as scheduled for 5/27  If you have any questions or concerns before your next appointment please send [place] a message through [place] or call our office at [phone].  At the [facility], you and your health needs are our priority. As part of our continuing mission to provide you with exceptional heart care, we have created designated Provider Care Teams. These Care Teams include your primary Cardiologist (physician) and Advanced Practice Providers (APPs- Physician Assistants and Nurse Practitioners) who all work together to provide you with the care you need, when you need it.   You may see any of the following providers on your designated Care Team at your next follow up: Marland Kitchen Dr Glori Bickers . Dr [name] . Darrick Grinder, NP . Lyda Jester, PA . Audry Riles, PharmD   Please be sure to bring in all your medications bottles to every appointment.

## 2020-01-22 NOTE — Progress Notes (Signed)
Medication Samples have been provided to the patient.  Drug name: Eliquis       Strength: 5mg         Qty: 4  LOT: [id number]  Exp.Date: 3/23  Dosing instructions: Take 1 tab Twice daily   The patient has been instructed regarding the correct time, dose, and frequency of taking this medication, including desired effects and most common side effects.   Larry [name] 12:38 PM 01/22/2020

## 2020-01-22 NOTE — Progress Notes (Signed)
Advanced Heart Failure Clinic Note   Referring Physician: PCP: Celene Squibb, MD PCP-Cardiologist: Dr. Aundra Dubin  HPI:   Larry [name] is a 79 year old with a history of CAD, persistent atrial fibrillation, and chronic diastolic CHF. Patient had initial PCI to RCA in 11/1998. This was complicated by stent thrombosis with inferior STEMI in 08/1999 requiring PCI. In 2017, he was noted to be in atrial fibrillation persistently. He decided not to undergo cardioversion. Echo in 5/19 showed EF 50-55%, severe biatrial enlargement.   He was doing well until around 5/19. At that time, he noted his HR running higher and he developed peripheral edema. He would "give out" much more easily. No chest pain, just felt a severe lack of energy. He would get short of breath walking short distances. He was started on torsemide. He was admitted in 6/19 for diuresis (acute/chronic diastolic CHF).   He was started on Tikosyn and went back into NSR.At a prior appointment with Dr. Aundra Dubin, he was noted to be in atrial fibrillation with prolonged QT interval.  This was discussed with Dr. Rayann Heman and we decreased Tikosyn to 125 mcg bid.  By 11/15/18, he was back in NSR, QTc ok.  In early 3/20, he noted low BP and lightheadedness.  His metoprolol and spironolactone were both discontinued.    In August 2020, CBC was checked, hgb was 7.6.  Torsemide was also increased due to concern for volume overload.  He had not had melena or BRBPR.  GI referral was recommended, he has never had a colonoscopy.  However, he declined this.  He thinks that his blood loss came from extensive bruising from a fall that he had had.  He was started on oral iron.  Hemoglobin had improved on supp Fe, up to 11.3 in 9/20.   He was recently admitted 3/21 w/ volume overload. Hospital course complicated by anemia and A fib. Hgb dropped so GI consulted. Had scope that showed several polyps, diverticulosis and hemrrhoids. EGD showed non bleeding ulcer.  Also had RHC that showed ASD. Dr Burt Knack consulted with plans to close on 01/30/2020. Discharge weight 186 pounds.   Had initial post hospital f/u on 4/8 and was noted to be NYHA Class III w/ marked volume overload. He was referred to remote home health for IV Lasix, with instructions to give 80 mg IV Lasix x 2 days, then return to PO torsemide 80 mg qam/ 40 mg qpm.   He presents back to clinic today for f/u. Here with his wife. He tells me that he is feeling much better. Breathing improved. No resting dyspnea. Does ok around the house w/o much symptoms but continues w/ bilateral LEE. Reports poor compliance, suprisingly, to IV Lasix. Feels he does better w/ PO torsemide. He reports that remote health also gave him a dose of metolazone on 4/12 which resulted in significant urine output. He has 2+ pitting LE edema on exam today but not wearing compression wraps. He states he removed last night to "give his legs a rest". EKG today shows NSR. HR 78 bpm. Denies abnormal bleeding w/ Eliquis. BP 100/70 but c/w his usual baseline and he is asymptomatic. He is scheduled for ASD closure a week from today. His wt today is 187 lb.    Review of Systems: [y] = yes, [ ]  = no   General: Weight gain [ ] ; Weight loss [ ] ; Anorexia [ ] ; Fatigue [ ] ; Fever [ ] ; Chills [ ] ; Weakness [ ]   Cardiac: Chest  pain/pressure [ ] ; Resting SOB [ ] ; Exertional SOB [ ] ; Orthopnea [ ] ; Pedal Edema [ ] ; Palpitations [ ] ; Syncope [ ] ; Presyncope [ ] ; Paroxysmal nocturnal dyspnea[ ]   Pulmonary: Cough [ ] ; Wheezing[ ] ; Hemoptysis[ ] ; Sputum [ ] ; Snoring [ ]   GI: Vomiting[ ] ; Dysphagia[ ] ; Melena[ ] ; Hematochezia [ ] ; Heartburn[ ] ; Abdominal pain [ ] ; Constipation [ ] ; Diarrhea [ ] ; BRBPR [ ]   GU: Hematuria[ ] ; Dysuria [ ] ; Nocturia[ ]   Vascular: Pain in legs with walking [ ] ; Pain in feet with lying flat [ ] ; Non-healing sores [ ] ; Stroke [ ] ; TIA [ ] ; Slurred speech [ ] ;  Neuro: Headaches[ ] ; Vertigo[ ] ; Seizures[ ] ; Paresthesias[ ] ;Blurred  vision [ ] ; Diplopia [ ] ; Vision changes [ ]   Ortho/Skin: Arthritis [ ] ; Joint pain [ ] ; Muscle pain [ ] ; Joint swelling [ ] ; Back Pain [ ] ; Rash [ ]   Psych: Depression[ ] ; Anxiety[ ]   Heme: Bleeding problems [ ] ; Clotting disorders [ ] ; Anemia [ ]   Endocrine: Diabetes [ ] ; Thyroid dysfunction[ ]    Past Medical History:  Diagnosis Date  . Coronary artery disease    status post remote PCI of the mid to distal RCA 11/1998 followed by acute ST elevation MI inferiorly 08/1999 with occlusion of the prior RCA stent as well as 70% diagonal branch.  He underwent PCI of the RCA  . Hypercholesterolemia   . Hypertension   . Myocardial infarct ([place])   . Olecranon bursitis     Current Outpatient Medications  Medication Sig Dispense Refill  . acetaminophen (TYLENOL) 500 MG tablet Take 1,000 mg by mouth as needed for moderate pain (back pain).     Marland Kitchen amiodarone (PACERONE) 200 MG tablet Take 1 tablet (200 mg total) by mouth 2 (two) times daily. 180 tablet 1  . apixaban (ELIQUIS) 5 MG TABS tablet Take 1 tablet (5 mg total) by mouth 2 (two) times daily. 180 tablet 1  . ferrous sulfate 325 (65 FE) MG tablet Take 1 tablet (325 mg total) by mouth 2 (two) times daily with a meal. 180 tablet 1  . loratadine (CLARITIN) 10 MG tablet Take 10 mg by mouth daily as needed for allergies or itching.     . midodrine (PROAMATINE) 5 MG tablet Take 1 tablet (5 mg total) by mouth 3 (three) times daily with meals. 270 tablet 1  . nitroGLYCERIN (NITROSTAT) 0.4 MG SL tablet Place 1 tablet (0.4 mg total) under the tongue every 5 (five) minutes as needed for chest pain (3 doses max). 25 tablet 3  . pantoprazole (PROTONIX) 40 MG tablet Take 1 tablet (40 mg total) by mouth 2 (two) times daily. 180 tablet 1  . polyethylene glycol powder (MIRALAX) 17 GM/SCOOP powder Take 17 g by mouth 2 (two) times daily as needed for moderate constipation. 255 g 1  . potassium chloride SA (KLOR-CON) 20 MEQ tablet Take 20 mEq by mouth daily.    .  pravastatin (PRAVACHOL) 40 MG tablet Take 1 tablet (40 mg total) by mouth every evening. 90 tablet 0  . torsemide (DEMADEX) 20 MG tablet Take 4 tablets (80 mg total) by mouth daily. 240 tablet 5   No current facility-administered medications for this encounter.    Allergies  Allergen Reactions  . Plavix [Clopidogrel Bisulfate] Anaphylaxis           Social History   Socioeconomic History  . Marital status: Married    Spouse name: scarlett  . Number of children:  3  . Years of education: 24  . Highest education level: Not on file  Occupational History  . Occupation: retired  Tobacco Use  . Smoking status: Former Smoker    Types: Cigarettes    Quit date: 11/02/1999    Years since quitting: 20.2  . Smokeless tobacco: Never Used  Substance and Sexual Activity  . Alcohol use: No  . Drug use: No  . Sexual activity: Not on file  Other Topics Concern  . Not on file  Social History Narrative  . Not on file   Social Determinants of Health   Financial Resource Strain:   . Difficulty of Paying Living Expenses:   Food Insecurity:   . Worried About Charity fundraiser in the Last Year:   . Arboriculturist in the Last Year:   Transportation Needs:   . Film/video editor (Medical):   Marland Kitchen Lack of Transportation (Non-Medical):   Physical Activity:   . Days of Exercise per Week:   . Minutes of Exercise per Session:   Stress:   . Feeling of Stress :   Social Connections:   . Frequency of Communication with Friends and Family:   . Frequency of Social Gatherings with Friends and Family:   . Attends Religious Services:   . Active Member of Clubs or Organizations:   . Attends Archivist Meetings:   Marland Kitchen Marital Status:   Intimate Partner Violence:   . Fear of Current or Ex-Partner:   . Emotionally Abused:   Marland Kitchen Physically Abused:   . Sexually Abused:       Family History  Problem Relation Age of Onset  . Heart Problems Mother   . Cancer Father     Vitals:    01/22/20 1048  BP: 100/70  Pulse: 78  SpO2: 98%  Weight: 85.2 kg (187 lb 12.8 oz)     PHYSICAL EXAM: General:  Well appearing elderly WM. No respiratory difficulty HEENT: normal Neck: supple. Elevated JVD to jawline. Carotids 2+ bilat; no bruits. No lymphadenopathy or thyromegaly appreciated. Cor: PMI nondisplaced. Regular rate & rhythm. No rubs, gallops or murmurs. Lungs: clear Abdomen: soft, nontender, nondistended. No hepatosplenomegaly. No bruits or masses. Good bowel sounds. Extremities: no cyanosis, clubbing, rash, 2+ bilateral LE edema Neuro: alert & oriented x 3, cranial nerves grossly intact. moves all 4 extremities w/o difficulty. Affect pleasant.  ECG: NSR, 78 bpm    ASSESSMENT & PLAN:  1.Acute on chronic diastolic YJE:HUDJ significant component of RV failure. Echo 3/21 showed EF 60-65% with D-shaped septum, mild-moderate RV dilation and normal RV systolic function, IVC dilated. RHC 3/1 suggestive of ASD with high shunt fraction and ASD confirms large secundum ASD (scheduled for closure by Dr. Burt Knack next week).   - no significant dyspnea but 2+ bilateral LEE, consistent w/ R>L sided heart failure - needs to improve compliance w/ unna boots. Will plan to rewrap legs when he returns home. Remote health to continue to follow. Also encouraged to elevate legs  - continue torsemide 80/40 for now. Will check BMP today to check SCr/ K. If stable, may further increase PM dose for a few days (will follow result first, need to ensure SCr and K stable given recent metolazone) - he is off  blocker due to hypotension and is on midodrine 5 mg tid. Will continue      2. CAD: s/p remote PCI to RCA, had stent thrombosis back in 2000. - denies CP  - No aspirin with  eliquis.  - Continue pravastatin, good lipids in 9/20.   3. Atrial fibrillation: Paroxysmal.  - He had to stop Tikosyn due to long QTc on lowest dose Tikosyn. He is now off Xarelto with GI bleeding. Changed to Eliquis.  Large ASD is likely driving the atrial fibrillation/flutter. - In NSR today.  HR controlled.  - Continue amiodarone>> reduce dose down to 200 mg daily.  - No longer on metoprolol due to hypotension. Now on midodrine  - Continue Eliquis 5 mg twice. Check CBC today   4. Anemia  Hgb down to 7.6 in 8/20.At the time, Dr Aundra Dubin recommended GI workup but he refused. Most recent admission, hgb markedly low and stool heme+. He has had 3 units PRBCs. He got IV Fe. He had EGD/colonoscopy 3/21, there was a nonbleeding gastric ulcer, several polyps removed, diverticulosis, and hemorrhoids.  - off Xarelto. Now on Eliquis.  - He denies further bleeding. Check CBC today.  - Continue PPI  5. ASD - Plan for closure on 01/12/2020 by Dr Burt Knack.    Keep f/u w/ Dr Aundra Dubin next month, post ASD closure   [place], [place] 01/22/20

## 2020-01-22 NOTE — Telephone Encounter (Signed)
-----   Message from Consuelo Pandy, [place] sent at 01/22/2020  3:48 PM EDT ----- SCr higher at 2.7 and K low at 2.9.  Hold tonight's' dose of torsemide. Resume back tomorrow but change back to just 80 mg once daily. Take 60 Meq of KCl this evening.  See if remote health can repeat BMP tomorrow and fax results to our office. They should hold off on metolazone for now.  Make sure he also wraps his legs w/ compression wraps and elevated to help with edema.

## 2020-01-23 ENCOUNTER — Telehealth: Payer: Self-pay | Admitting: Cardiovascular Disease

## 2020-01-23 DIAGNOSIS — I5022 Chronic systolic (congestive) heart failure: Secondary | ICD-10-CM | POA: Diagnosis not present

## 2020-01-23 DIAGNOSIS — Q211 Atrial septal defect, unspecified: Secondary | ICD-10-CM

## 2020-01-23 NOTE — Telephone Encounter (Signed)
I think he must be referring to his ASD closure.  Need to check with Dr. Antionette Char office to hear from them how long they want the Eliquis held.

## 2020-01-23 NOTE — Telephone Encounter (Signed)
Spoke with pt and he gave me permission to speak with daughter.  Advised daughter, per previous phone note, pt is to hold Eliquis the night before and the morning of the procedure.  Daughter also inquired about additional blood work her mom had mentioned.  Larry [name] is planning to finalize plans and call on Monday.  Daughter appreciative for call.

## 2020-01-23 NOTE — Telephone Encounter (Signed)
Spoke with patients wife she wants to know how long patient needs to hold eliquis before his heart cath. I do not see any notes stating how long to hold.  Will follow up with pts wife after I receive response from [place]   Routed to [place] for advice

## 2020-01-23 NOTE — Telephone Encounter (Signed)
New message:    Patient wife calling concerning patient Eliquis. Please call patient wife.

## 2020-01-23 NOTE — Telephone Encounter (Signed)
Please contact patient about ASD Closure directions. Spoke with patients daughter and she is concerned about when he is suppose to stop his Eliquis. She also states she was suppose to receive a letter in the mail with directions but it hasn't came yet

## 2020-01-26 ENCOUNTER — Encounter (HOSPITAL_COMMUNITY): Payer: Self-pay

## 2020-01-26 ENCOUNTER — Other Ambulatory Visit (HOSPITAL_COMMUNITY): Payer: PPO

## 2020-01-26 MED ORDER — ASPIRIN EC 81 MG PO TBEC: 81.0000 mg | DELAYED_RELEASE_TABLET | Freq: Every day | ORAL | 3 refills | Status: DC

## 2020-01-26 NOTE — Telephone Encounter (Signed)
Spoke with the patient's wife. Reiterated to her that notes were reviewed by [facility] Clinic and he should be OK for procedure.  Reviewed procedure instructions in great detail and released instructions to MyChart. He will have repeat labs drawn prior to [place] test tomorrow. His wife was grateful for assistance.

## 2020-01-26 NOTE — Telephone Encounter (Signed)
Spoke with the patient's wife. Reiterated to her that notes were reviewed by [facility] Clinic and he should be OK for procedure.  Reviewed procedure instructions in great detail and released instructions to MyChart. He will have repeat labs drawn prior to [place] test tomorrow. His wife was grateful for assistance.

## 2020-01-27 ENCOUNTER — Other Ambulatory Visit: Payer: Self-pay

## 2020-01-27 ENCOUNTER — Other Ambulatory Visit: Payer: PPO

## 2020-01-27 ENCOUNTER — Other Ambulatory Visit (HOSPITAL_COMMUNITY)
Admission: RE | Admit: 2020-01-27 | Discharge: 2020-01-27 | Disposition: A | Discharge status: Home / Self Care | Payer: PPO | Source: Ambulatory Visit | Attending: Cardiovascular Disease | Admitting: Cardiovascular Disease

## 2020-01-27 DIAGNOSIS — J181 Lobar pneumonia, unspecified organism: Secondary | ICD-10-CM | POA: Diagnosis not present

## 2020-01-27 DIAGNOSIS — Z20822 Contact with and (suspected) exposure to covid-19: Secondary | ICD-10-CM | POA: Diagnosis present

## 2020-01-27 DIAGNOSIS — I509 Heart failure, unspecified: Secondary | ICD-10-CM | POA: Diagnosis not present

## 2020-01-27 DIAGNOSIS — J90 Pleural effusion, not elsewhere classified: Secondary | ICD-10-CM | POA: Diagnosis present

## 2020-01-27 DIAGNOSIS — Q211 Atrial septal defect, unspecified: Secondary | ICD-10-CM

## 2020-01-27 DIAGNOSIS — A419 Sepsis, unspecified organism: Secondary | ICD-10-CM | POA: Diagnosis not present

## 2020-01-27 DIAGNOSIS — R6521 Severe sepsis with septic shock: Secondary | ICD-10-CM | POA: Diagnosis not present

## 2020-01-27 DIAGNOSIS — I480 Paroxysmal atrial fibrillation: Secondary | ICD-10-CM | POA: Diagnosis present

## 2020-01-27 DIAGNOSIS — I2510 Atherosclerotic heart disease of native coronary artery without angina pectoris: Secondary | ICD-10-CM | POA: Diagnosis present

## 2020-01-27 DIAGNOSIS — D62 Acute posthemorrhagic anemia: Secondary | ICD-10-CM | POA: Diagnosis present

## 2020-01-27 DIAGNOSIS — I5033 Acute on chronic diastolic (congestive) heart failure: Secondary | ICD-10-CM | POA: Diagnosis present

## 2020-01-27 DIAGNOSIS — I252 Old myocardial infarction: Secondary | ICD-10-CM | POA: Diagnosis not present

## 2020-01-27 DIAGNOSIS — J156 Pneumonia due to other aerobic Gram-negative bacteria: Secondary | ICD-10-CM | POA: Diagnosis not present

## 2020-01-27 DIAGNOSIS — I130 Hypertensive heart and chronic kidney disease with heart failure and stage 1 through stage 4 chronic kidney disease, or unspecified chronic kidney disease: Secondary | ICD-10-CM | POA: Diagnosis present

## 2020-01-27 DIAGNOSIS — Z8719 Personal history of other diseases of the digestive system: Secondary | ICD-10-CM | POA: Diagnosis not present

## 2020-01-27 DIAGNOSIS — D696 Thrombocytopenia, unspecified: Secondary | ICD-10-CM | POA: Diagnosis present

## 2020-01-27 DIAGNOSIS — Z01812 Encounter for preprocedural laboratory examination: Secondary | ICD-10-CM | POA: Insufficient documentation

## 2020-01-27 DIAGNOSIS — N189 Chronic kidney disease, unspecified: Secondary | ICD-10-CM | POA: Diagnosis not present

## 2020-01-27 DIAGNOSIS — J9601 Acute respiratory failure with hypoxia: Secondary | ICD-10-CM | POA: Diagnosis present

## 2020-01-27 DIAGNOSIS — K921 Melena: Secondary | ICD-10-CM | POA: Diagnosis not present

## 2020-01-27 DIAGNOSIS — Z9911 Dependence on respirator [ventilator] status: Secondary | ICD-10-CM | POA: Diagnosis not present

## 2020-01-27 DIAGNOSIS — A498 Other bacterial infections of unspecified site: Secondary | ICD-10-CM | POA: Diagnosis not present

## 2020-01-27 DIAGNOSIS — R918 Other nonspecific abnormal finding of lung field: Secondary | ICD-10-CM | POA: Diagnosis not present

## 2020-01-27 DIAGNOSIS — Z7189 Other specified counseling: Secondary | ICD-10-CM | POA: Diagnosis not present

## 2020-01-27 DIAGNOSIS — I5082 Biventricular heart failure: Secondary | ICD-10-CM | POA: Diagnosis present

## 2020-01-27 DIAGNOSIS — N184 Chronic kidney disease, stage 4 (severe): Secondary | ICD-10-CM | POA: Diagnosis present

## 2020-01-27 DIAGNOSIS — J9690 Respiratory failure, unspecified, unspecified whether with hypoxia or hypercapnia: Secondary | ICD-10-CM | POA: Diagnosis not present

## 2020-01-27 DIAGNOSIS — D509 Iron deficiency anemia, unspecified: Secondary | ICD-10-CM | POA: Diagnosis present

## 2020-01-27 DIAGNOSIS — E872 Acidosis: Secondary | ICD-10-CM | POA: Diagnosis not present

## 2020-01-27 DIAGNOSIS — E8770 Fluid overload, unspecified: Secondary | ICD-10-CM | POA: Diagnosis not present

## 2020-01-27 DIAGNOSIS — R0602 Shortness of breath: Secondary | ICD-10-CM | POA: Diagnosis not present

## 2020-01-27 DIAGNOSIS — G4733 Obstructive sleep apnea (adult) (pediatric): Secondary | ICD-10-CM | POA: Diagnosis not present

## 2020-01-27 DIAGNOSIS — J948 Other specified pleural conditions: Secondary | ICD-10-CM | POA: Diagnosis not present

## 2020-01-27 DIAGNOSIS — Z4682 Encounter for fitting and adjustment of non-vascular catheter: Secondary | ICD-10-CM | POA: Diagnosis not present

## 2020-01-27 DIAGNOSIS — N179 Acute kidney failure, unspecified: Secondary | ICD-10-CM | POA: Diagnosis present

## 2020-01-27 DIAGNOSIS — E43 Unspecified severe protein-calorie malnutrition: Secondary | ICD-10-CM | POA: Diagnosis present

## 2020-01-27 DIAGNOSIS — G9341 Metabolic encephalopathy: Secondary | ICD-10-CM | POA: Diagnosis not present

## 2020-01-27 DIAGNOSIS — I4819 Other persistent atrial fibrillation: Secondary | ICD-10-CM | POA: Diagnosis present

## 2020-01-27 DIAGNOSIS — I129 Hypertensive chronic kidney disease with stage 1 through stage 4 chronic kidney disease, or unspecified chronic kidney disease: Secondary | ICD-10-CM | POA: Diagnosis not present

## 2020-01-27 DIAGNOSIS — R54 Age-related physical debility: Secondary | ICD-10-CM | POA: Diagnosis present

## 2020-01-27 DIAGNOSIS — Z8774 Personal history of (corrected) congenital malformations of heart and circulatory system: Secondary | ICD-10-CM | POA: Diagnosis not present

## 2020-01-27 DIAGNOSIS — E871 Hypo-osmolality and hyponatremia: Secondary | ICD-10-CM | POA: Diagnosis present

## 2020-01-27 DIAGNOSIS — Z66 Do not resuscitate: Secondary | ICD-10-CM | POA: Diagnosis not present

## 2020-01-27 LAB — BASIC METABOLIC PANEL
BUN/Creatinine Ratio: 27 — ABNORMAL HIGH (ref 10–24)
BUN: 74 mg/dL — ABNORMAL HIGH (ref 8–27)
CO2: 30 mmol/L — ABNORMAL HIGH (ref 20–29)
Calcium: 9.3 mg/dL (ref 8.6–10.2)
Chloride: 87 mmol/L — ABNORMAL LOW (ref 96–106)
Creatinine, Ser: 2.75 mg/dL — ABNORMAL HIGH (ref 0.76–1.27)
GFR calc Af Amer: 25 mL/min/{1.73_m2} — ABNORMAL LOW (ref >59)
GFR calc non Af Amer: 21 mL/min/{1.73_m2} — ABNORMAL LOW (ref >59)
Glucose: 138 mg/dL — ABNORMAL HIGH (ref 65–99)
Potassium: 3.9 mmol/L (ref 3.5–5.2)
Sodium: 125 mmol/L — ABNORMAL LOW (ref 134–144)

## 2020-01-27 LAB — CBC WITH DIFFERENTIAL/PLATELET
Basophils Absolute: 0 10*3/uL (ref 0.0–0.2)
Basos: 0 %
EOS (ABSOLUTE): 0.1 10*3/uL (ref 0.0–0.4)
Eos: 1 %
Hematocrit: 36.6 % — ABNORMAL LOW (ref 37.5–51.0)
Hemoglobin: 12.3 g/dL — ABNORMAL LOW (ref 13.0–17.7)
Lymphocytes Absolute: 0.9 10*3/uL (ref 0.7–3.1)
Lymphs: 7 %
MCH: 29.7 pg (ref 26.6–33.0)
MCHC: 33.6 g/dL (ref 31.5–35.7)
MCV: 88 fL (ref 79–97)
Monocytes Absolute: 1.2 10*3/uL — ABNORMAL HIGH (ref 0.1–0.9)
Monocytes: 10 %
Neutrophils Absolute: 9.9 10*3/uL — ABNORMAL HIGH (ref 1.4–7.0)
Neutrophils: 82 %
Platelets: 185 10*3/uL (ref 150–450)
RBC: 4.14 x10E6/uL (ref 4.14–5.80)
RDW: 22.8 % — ABNORMAL HIGH (ref 11.6–15.4)
WBC: 12.1 10*3/uL — ABNORMAL HIGH (ref 3.4–10.8)

## 2020-01-27 LAB — SARS CORONAVIRUS 2 (TAT 6-24 HRS): SARS Coronavirus 2: NEGATIVE

## 2020-01-29 ENCOUNTER — Inpatient Hospital Stay (HOSPITAL_COMMUNITY)
Admission: RE | Admit: 2020-01-29 | Discharge: 2020-03-09 | DRG: 273 | Disposition: E | Payer: PPO | Attending: Cardiovascular Disease | Admitting: Cardiovascular Disease

## 2020-01-29 ENCOUNTER — Encounter (HOSPITAL_COMMUNITY): Admission: RE | Disposition: E | Payer: PPO | Source: Home / Self Care | Attending: Cardiovascular Disease

## 2020-01-29 ENCOUNTER — Other Ambulatory Visit: Payer: Self-pay

## 2020-01-29 ENCOUNTER — Other Ambulatory Visit (HOSPITAL_COMMUNITY): Payer: PPO

## 2020-01-29 ENCOUNTER — Encounter (HOSPITAL_COMMUNITY): Payer: PPO | Admitting: Cardiology

## 2020-01-29 DIAGNOSIS — Z9889 Other specified postprocedural states: Secondary | ICD-10-CM

## 2020-01-29 DIAGNOSIS — I480 Paroxysmal atrial fibrillation: Secondary | ICD-10-CM | POA: Diagnosis present

## 2020-01-29 DIAGNOSIS — R54 Age-related physical debility: Secondary | ICD-10-CM | POA: Diagnosis present

## 2020-01-29 DIAGNOSIS — E785 Hyperlipidemia, unspecified: Secondary | ICD-10-CM | POA: Diagnosis present

## 2020-01-29 DIAGNOSIS — Z8774 Personal history of (corrected) congenital malformations of heart and circulatory system: Secondary | ICD-10-CM

## 2020-01-29 DIAGNOSIS — I2510 Atherosclerotic heart disease of native coronary artery without angina pectoris: Secondary | ICD-10-CM | POA: Diagnosis present

## 2020-01-29 DIAGNOSIS — Z7901 Long term (current) use of anticoagulants: Secondary | ICD-10-CM

## 2020-01-29 DIAGNOSIS — E7800 Pure hypercholesterolemia, unspecified: Secondary | ICD-10-CM | POA: Diagnosis present

## 2020-01-29 DIAGNOSIS — D696 Thrombocytopenia, unspecified: Secondary | ICD-10-CM | POA: Diagnosis present

## 2020-01-29 DIAGNOSIS — R319 Hematuria, unspecified: Secondary | ICD-10-CM | POA: Diagnosis not present

## 2020-01-29 DIAGNOSIS — I509 Heart failure, unspecified: Secondary | ICD-10-CM

## 2020-01-29 DIAGNOSIS — R6521 Severe sepsis with septic shock: Secondary | ICD-10-CM | POA: Diagnosis not present

## 2020-01-29 DIAGNOSIS — Q211 Atrial septal defect, unspecified: Secondary | ICD-10-CM

## 2020-01-29 DIAGNOSIS — N179 Acute kidney failure, unspecified: Secondary | ICD-10-CM | POA: Diagnosis present

## 2020-01-29 DIAGNOSIS — T82528A Displacement of other cardiac and vascular devices and implants, initial encounter: Secondary | ICD-10-CM

## 2020-01-29 DIAGNOSIS — I5033 Acute on chronic diastolic (congestive) heart failure: Secondary | ICD-10-CM

## 2020-01-29 DIAGNOSIS — Z8719 Personal history of other diseases of the digestive system: Secondary | ICD-10-CM

## 2020-01-29 DIAGNOSIS — J9601 Acute respiratory failure with hypoxia: Secondary | ICD-10-CM

## 2020-01-29 DIAGNOSIS — Z9989 Dependence on other enabling machines and devices: Secondary | ICD-10-CM

## 2020-01-29 DIAGNOSIS — N184 Chronic kidney disease, stage 4 (severe): Secondary | ICD-10-CM | POA: Diagnosis present

## 2020-01-29 DIAGNOSIS — E871 Hypo-osmolality and hyponatremia: Secondary | ICD-10-CM | POA: Diagnosis present

## 2020-01-29 DIAGNOSIS — D62 Acute posthemorrhagic anemia: Secondary | ICD-10-CM | POA: Diagnosis present

## 2020-01-29 DIAGNOSIS — J90 Pleural effusion, not elsewhere classified: Secondary | ICD-10-CM | POA: Diagnosis present

## 2020-01-29 DIAGNOSIS — K921 Melena: Secondary | ICD-10-CM | POA: Diagnosis not present

## 2020-01-29 DIAGNOSIS — Z79899 Other long term (current) drug therapy: Secondary | ICD-10-CM

## 2020-01-29 DIAGNOSIS — Z955 Presence of coronary angioplasty implant and graft: Secondary | ICD-10-CM

## 2020-01-29 DIAGNOSIS — I252 Old myocardial infarction: Secondary | ICD-10-CM

## 2020-01-29 DIAGNOSIS — Q2111 Secundum atrial septal defect: Secondary | ICD-10-CM

## 2020-01-29 DIAGNOSIS — Z4682 Encounter for fitting and adjustment of non-vascular catheter: Secondary | ICD-10-CM

## 2020-01-29 DIAGNOSIS — I10 Essential (primary) hypertension: Secondary | ICD-10-CM | POA: Diagnosis present

## 2020-01-29 DIAGNOSIS — G4733 Obstructive sleep apnea (adult) (pediatric): Secondary | ICD-10-CM

## 2020-01-29 DIAGNOSIS — Z978 Presence of other specified devices: Secondary | ICD-10-CM

## 2020-01-29 DIAGNOSIS — A419 Sepsis, unspecified organism: Secondary | ICD-10-CM | POA: Diagnosis not present

## 2020-01-29 DIAGNOSIS — I5082 Biventricular heart failure: Secondary | ICD-10-CM | POA: Diagnosis present

## 2020-01-29 DIAGNOSIS — E43 Unspecified severe protein-calorie malnutrition: Secondary | ICD-10-CM | POA: Insufficient documentation

## 2020-01-29 DIAGNOSIS — Z20822 Contact with and (suspected) exposure to covid-19: Secondary | ICD-10-CM | POA: Diagnosis present

## 2020-01-29 DIAGNOSIS — I4819 Other persistent atrial fibrillation: Secondary | ICD-10-CM

## 2020-01-29 DIAGNOSIS — R0600 Dyspnea, unspecified: Secondary | ICD-10-CM

## 2020-01-29 DIAGNOSIS — Z66 Do not resuscitate: Secondary | ICD-10-CM | POA: Diagnosis not present

## 2020-01-29 DIAGNOSIS — D509 Iron deficiency anemia, unspecified: Secondary | ICD-10-CM | POA: Diagnosis present

## 2020-01-29 DIAGNOSIS — G9341 Metabolic encephalopathy: Secondary | ICD-10-CM | POA: Diagnosis not present

## 2020-01-29 DIAGNOSIS — I130 Hypertensive heart and chronic kidney disease with heart failure and stage 1 through stage 4 chronic kidney disease, or unspecified chronic kidney disease: Secondary | ICD-10-CM | POA: Diagnosis present

## 2020-01-29 DIAGNOSIS — R0902 Hypoxemia: Secondary | ICD-10-CM

## 2020-01-29 HISTORY — PX: ATRIAL SEPTAL DEFECT(ASD) CLOSURE: CATH118299

## 2020-01-29 HISTORY — DX: Personal history of (corrected) congenital malformations of heart and circulatory system: Z87.74

## 2020-01-29 LAB — POCT ACTIVATED CLOTTING TIME
Activated Clotting Time: 334 seconds
Activated Clotting Time: 246 seconds

## 2020-01-29 SURGERY — ATRIAL SEPTAL DEFECT (ASD) CLOSURE
Anesthesia: LOCAL

## 2020-01-29 MED ORDER — MIDODRINE HCL 5 MG PO TABS
5.0000 mg | ORAL_TABLET | Freq: Three times a day (TID) | ORAL | Status: DC
  Administered 2020-01-29 – 2020-01-31 (×6): 5 mg via ORAL
  Filled 2020-01-29 (×7): qty 1

## 2020-01-29 MED ORDER — SODIUM CHLORIDE 0.9% FLUSH
3.0000 mL | Freq: Two times a day (BID) | INTRAVENOUS | Status: DC
  Administered 2020-01-29 – 2020-02-08 (×13): 3 mL via INTRAVENOUS

## 2020-01-29 MED ORDER — LABETALOL HCL 5 MG/ML IV SOLN: 10.0000 mg | INTRAVENOUS | Status: AC | PRN

## 2020-01-29 MED ORDER — LIDOCAINE HCL (PF) 1 % IJ SOLN
INTRAMUSCULAR | Status: DC | PRN
  Administered 2020-01-29: 18 mL via INTRADERMAL

## 2020-01-29 MED ORDER — AMIODARONE HCL 200 MG PO TABS
200.0000 mg | ORAL_TABLET | Freq: Two times a day (BID) | ORAL | Status: DC
  Administered 2020-01-29 – 2020-01-30 (×2): 200 mg via ORAL
  Filled 2020-01-29 (×2): qty 1

## 2020-01-29 MED ORDER — ASPIRIN EC 81 MG PO TBEC
81.0000 mg | DELAYED_RELEASE_TABLET | Freq: Every day | ORAL | Status: DC
  Administered 2020-01-30: 81 mg via ORAL
  Filled 2020-01-29: qty 1

## 2020-01-29 MED ORDER — HEPARIN (PORCINE) IN NACL 1000-0.9 UT/500ML-% IV SOLN
INTRAVENOUS | Status: AC
  Filled 2020-01-29: qty 500

## 2020-01-29 MED ORDER — ONDANSETRON HCL 4 MG/2ML IJ SOLN
4.0000 mg | Freq: Four times a day (QID) | INTRAMUSCULAR | Status: DC | PRN
  Administered 2020-01-30: 4 mg via INTRAVENOUS
  Filled 2020-01-29: qty 2

## 2020-01-29 MED ORDER — CEFAZOLIN SODIUM-DEXTROSE 2-4 GM/100ML-% IV SOLN
2.0000 g | INTRAVENOUS | Status: AC
  Administered 2020-01-29: 2 g via INTRAVENOUS

## 2020-01-29 MED ORDER — PANTOPRAZOLE SODIUM 40 MG PO TBEC
40.0000 mg | DELAYED_RELEASE_TABLET | Freq: Two times a day (BID) | ORAL | Status: DC
  Administered 2020-01-29 – 2020-02-04 (×13): 40 mg via ORAL
  Filled 2020-01-29 (×12): qty 1

## 2020-01-29 MED ORDER — OXYCODONE HCL 5 MG PO TABS: 5.0000 mg | ORAL_TABLET | ORAL | Status: DC | PRN

## 2020-01-29 MED ORDER — NITROGLYCERIN 0.4 MG SL SUBL: 0.4000 mg | SUBLINGUAL_TABLET | SUBLINGUAL | Status: DC | PRN

## 2020-01-29 MED ORDER — SODIUM CHLORIDE 0.9 % IV SOLN: 250.0000 mL | INTRAVENOUS | Status: DC | PRN

## 2020-01-29 MED ORDER — MIDAZOLAM HCL 2 MG/2ML IJ SOLN
INTRAMUSCULAR | Status: DC | PRN
  Administered 2020-01-29: 1 mg via INTRAVENOUS

## 2020-01-29 MED ORDER — HEPARIN SODIUM (PORCINE) 1000 UNIT/ML IJ SOLN
INTRAMUSCULAR | Status: AC
  Filled 2020-01-29: qty 1

## 2020-01-29 MED ORDER — APIXABAN 5 MG PO TABS
5.0000 mg | ORAL_TABLET | Freq: Two times a day (BID) | ORAL | Status: DC
  Administered 2020-01-30 – 2020-01-31 (×3): 5 mg via ORAL
  Filled 2020-01-29 (×3): qty 1

## 2020-01-29 MED ORDER — ASPIRIN 81 MG PO CHEW: 81.0000 mg | CHEWABLE_TABLET | ORAL | Status: DC

## 2020-01-29 MED ORDER — ACETAMINOPHEN 500 MG PO TABS: 1000.0000 mg | ORAL_TABLET | Freq: Four times a day (QID) | ORAL | Status: DC | PRN

## 2020-01-29 MED ORDER — SODIUM CHLORIDE 0.9 % WEIGHT BASED INFUSION: 1.0000 mL/kg/h | INTRAVENOUS | Status: DC

## 2020-01-29 MED ORDER — FERROUS SULFATE 325 (65 FE) MG PO TABS
325.0000 mg | ORAL_TABLET | Freq: Two times a day (BID) | ORAL | Status: DC
  Filled 2020-01-29 (×3): qty 1

## 2020-01-29 MED ORDER — SODIUM CHLORIDE 0.9 % WEIGHT BASED INFUSION
3.0000 mL/kg/h | INTRAVENOUS | Status: DC
  Administered 2020-01-29: 3 mL/kg/h via INTRAVENOUS

## 2020-01-29 MED ORDER — HEPARIN (PORCINE) IN NACL 1000-0.9 UT/500ML-% IV SOLN
INTRAVENOUS | Status: AC
  Filled 2020-01-29: qty 1500

## 2020-01-29 MED ORDER — SODIUM CHLORIDE 0.9% FLUSH
3.0000 mL | Freq: Two times a day (BID) | INTRAVENOUS | Status: DC
  Administered 2020-01-29: 3 mL via INTRAVENOUS

## 2020-01-29 MED ORDER — CEFAZOLIN SODIUM-DEXTROSE 2-4 GM/100ML-% IV SOLN
INTRAVENOUS | Status: AC
  Filled 2020-01-29: qty 100

## 2020-01-29 MED ORDER — SODIUM CHLORIDE 0.9% FLUSH: 3.0000 mL | INTRAVENOUS | Status: DC | PRN

## 2020-01-29 MED ORDER — PRAVASTATIN SODIUM 40 MG PO TABS
40.0000 mg | ORAL_TABLET | Freq: Every evening | ORAL | Status: DC
  Administered 2020-01-29 – 2020-02-06 (×9): 40 mg via ORAL
  Filled 2020-01-29 (×9): qty 1

## 2020-01-29 MED ORDER — HEPARIN (PORCINE) IN NACL 1000-0.9 UT/500ML-% IV SOLN
INTRAVENOUS | Status: DC | PRN
  Administered 2020-01-29 (×2): 500 mL

## 2020-01-29 MED ORDER — HEPARIN SODIUM (PORCINE) 1000 UNIT/ML IJ SOLN
INTRAMUSCULAR | Status: DC | PRN
  Administered 2020-01-29: 7000 [IU] via INTRAVENOUS

## 2020-01-29 MED ORDER — HYDRALAZINE HCL 20 MG/ML IJ SOLN: 10.0000 mg | INTRAMUSCULAR | Status: AC | PRN

## 2020-01-29 MED ORDER — POLYETHYLENE GLYCOL 3350 17 G PO PACK
17.0000 g | PACK | Freq: Two times a day (BID) | ORAL | Status: DC | PRN
  Administered 2020-02-03: 13:00:00 17 g via ORAL
  Filled 2020-01-29: qty 1

## 2020-01-29 MED ORDER — FENTANYL CITRATE (PF) 100 MCG/2ML IJ SOLN
INTRAMUSCULAR | Status: DC | PRN
  Administered 2020-01-29: 25 ug via INTRAVENOUS

## 2020-01-29 MED ORDER — MIDAZOLAM HCL 2 MG/2ML IJ SOLN
INTRAMUSCULAR | Status: AC
  Filled 2020-01-29: qty 2

## 2020-01-29 MED ORDER — LIDOCAINE HCL (PF) 1 % IJ SOLN
INTRAMUSCULAR | Status: AC
  Filled 2020-01-29: qty 30

## 2020-01-29 MED ORDER — FENTANYL CITRATE (PF) 100 MCG/2ML IJ SOLN
INTRAMUSCULAR | Status: AC
  Filled 2020-01-29: qty 2

## 2020-01-29 SURGICAL SUPPLY — 22 items
BALLN SIZING AMPLATZER 34 (BALLOONS) ×2
BALLOON SIZING AMPLATZER 34 (BALLOONS) ×1 IMPLANT
CATH ACUNAV REPROCESSED (CATHETERS) ×2 IMPLANT
CATH EXPO 5F MPA-1 (CATHETERS) ×2 IMPLANT
CATH INFINITI JR4 5F (CATHETERS) ×2 IMPLANT
COVER SWIFTLINK CONNECTOR (BAG) ×2 IMPLANT
DEVICE CLOSURE PERCLS PRGLD 6F (VASCULAR PRODUCTS) ×2 IMPLANT
GUIDEWIRE AMPLATZER 1.5JX260 (WIRE) ×2 IMPLANT
GUIDEWIRE ANGLED .035X150CM (WIRE) ×2 IMPLANT
OCCLUDER AMPLATZER SEPTAL 26MM (Prosthesis & Implant Heart) ×2 IMPLANT
PACK CARDIAC CATHETERIZATION (CUSTOM PROCEDURE TRAY) ×2 IMPLANT
PATCH THROMBIX TOPICAL PLAIN (HEMOSTASIS) ×2 IMPLANT
PERCLOSE PROGLIDE 6F (VASCULAR PRODUCTS) ×4
PROTECTION STATION PRESSURIZED (MISCELLANEOUS) ×2
SHEATH INTROD W/O MIN 9FR 25CM (SHEATH) ×2 IMPLANT
SHEATH PINNACLE 8F 10CM (SHEATH) ×2 IMPLANT
SHEATH PROBE COVER 6X72 (BAG) ×2 IMPLANT
STATION PROTECTION PRESSURIZED (MISCELLANEOUS) ×1 IMPLANT
SYS DELIVER AMP TREVISIO 10FR (SHEATH) ×2
SYSTEM DELIVER AMP TREVIS 10FR (SHEATH) ×1 IMPLANT
WIRE EMERALD 3MM-J .035X150CM (WIRE) ×2 IMPLANT
WIRE G VAS 035X260 STIFF (WIRE) ×2 IMPLANT

## 2020-01-29 NOTE — Progress Notes (Signed)
Pt arrived to unit post Atrial septal defect closure, VVS, pt oriented to unit, Right incision  groin level 0, call light in reach .  Phoebe Sharps, RN

## 2020-01-29 NOTE — Progress Notes (Signed)
Pt w/ rectal temperature of 94.8. Dr. Burt Knack paged. Ordered received for warm blankets and recheck temperature in a few hours. Will continue to monitor.  Clyde Canterbury, RN

## 2020-01-29 NOTE — Interval H&P Note (Signed)
History and Physical Interval Note:  01/09/2020 7:48 AM  Larry [name]  has presented today for surgery, with the diagnosis of ASD.  The various methods of treatment have been discussed with the patient and family. After consideration of risks, benefits and other options for treatment, the patient has consented to  Procedure(s): ATRIAL SEPTAL DEFECT (ASD) CLOSURE (N/A) as a surgical intervention.  The patient's history has been reviewed, patient examined, no change in status, stable for surgery.  I have reviewed the patient's chart and labs.  Questions were answered to the patient's satisfaction.     Sherren Mocha

## 2020-01-29 NOTE — Plan of Care (Signed)
  Problem: Education: Goal: Knowledge of General Education information will improve Description Including pain rating scale, medication(s)/side effects and non-pharmacologic comfort measures Outcome: Progressing   Problem: Health Behavior/Discharge Planning: Goal: Ability to manage health-related needs will improve Outcome: Progressing   

## 2020-01-29 NOTE — Discharge Instructions (Signed)
You will require antibiotics prior to any dental work, including cleanings, for 6 months after your PFO closure. This is to protect the device from potentially getting infected from bacteria in your mouth entering your bloodstream. The medication has been called into your pharmacy on file. Please pick this up to have ready before any scheduled dental work. Instructions will be outlined on the bottle. The medication should be taken 1 hour prior to your dental appointment.   Groin Site Care Refer to this sheet in the next few weeks. These instructions provide you with information on caring for yourself after your procedure. Your caregiver may also give you more specific instructions. Your treatment has been planned according to current medical practices, but problems sometimes occur. Call your caregiver if you have any problems or questions after your procedure. HOME CARE INSTRUCTIONS  You may shower 24 hours after the procedure. Remove the bandage (dressing) and gently wash the site with plain soap and water. Gently pat the site dry.   Do not apply powder or lotion to the site.   Do not sit in a bathtub, swimming pool, or whirlpool for 5 to 7 days.   No bending, squatting, or lifting anything over 10 pounds (4.5 kg) for 1 week  Inspect the site at least twice daily.   Do not drive home if you are discharged the same day of the procedure. Have someone else drive you.   You may drive 24 hours after the procedure unless otherwise instructed by your caregiver.  What to expect:  Any bruising will usually fade within 1 to 2 weeks.   Blood that collects in the tissue (hematoma) may be painful to the touch. It should usually decrease in size and tenderness within 1 to 2 weeks.  SEEK IMMEDIATE MEDICAL CARE IF:  You have unusual pain at the groin site or down the affected leg.   You have redness, warmth, swelling, or pain at the groin site.   You have drainage (other than a small amount of blood  on the dressing).   You have chills.   You have a fever or persistent symptoms for more than 72 hours.   You have a fever and your symptoms suddenly get worse.   Your leg becomes pale, cool, tingly, or numb.  You have heavy bleeding from the site. Hold pressure on the site.

## 2020-01-29 NOTE — Progress Notes (Signed)
Post procedure check: Patient doing well with no complaints.  Right groin site is clear.  Wife is at bedside.  Questions answered.  Plan to do a limited echo tomorrow morning and discharge home as long as his echo looks good.  Sherren Mocha 01/19/2020 5:23 PM

## 2020-01-30 ENCOUNTER — Ambulatory Visit (HOSPITAL_COMMUNITY): Payer: PPO

## 2020-01-30 ENCOUNTER — Encounter (HOSPITAL_COMMUNITY): Payer: Self-pay | Admitting: Cardiovascular Disease

## 2020-01-30 ENCOUNTER — Encounter (HOSPITAL_COMMUNITY): Admission: RE | Disposition: E | Payer: Self-pay | Source: Home / Self Care | Attending: Cardiovascular Disease

## 2020-01-30 DIAGNOSIS — Z7189 Other specified counseling: Secondary | ICD-10-CM | POA: Diagnosis not present

## 2020-01-30 DIAGNOSIS — I2510 Atherosclerotic heart disease of native coronary artery without angina pectoris: Secondary | ICD-10-CM | POA: Diagnosis present

## 2020-01-30 DIAGNOSIS — G4733 Obstructive sleep apnea (adult) (pediatric): Secondary | ICD-10-CM

## 2020-01-30 DIAGNOSIS — N184 Chronic kidney disease, stage 4 (severe): Secondary | ICD-10-CM | POA: Diagnosis present

## 2020-01-30 DIAGNOSIS — Z8774 Personal history of (corrected) congenital malformations of heart and circulatory system: Secondary | ICD-10-CM | POA: Diagnosis not present

## 2020-01-30 DIAGNOSIS — G9341 Metabolic encephalopathy: Secondary | ICD-10-CM | POA: Diagnosis not present

## 2020-01-30 DIAGNOSIS — D509 Iron deficiency anemia, unspecified: Secondary | ICD-10-CM | POA: Diagnosis present

## 2020-01-30 DIAGNOSIS — J181 Lobar pneumonia, unspecified organism: Secondary | ICD-10-CM | POA: Diagnosis not present

## 2020-01-30 DIAGNOSIS — E43 Unspecified severe protein-calorie malnutrition: Secondary | ICD-10-CM | POA: Diagnosis present

## 2020-01-30 DIAGNOSIS — N189 Chronic kidney disease, unspecified: Secondary | ICD-10-CM

## 2020-01-30 DIAGNOSIS — Q211 Atrial septal defect: Secondary | ICD-10-CM | POA: Diagnosis present

## 2020-01-30 DIAGNOSIS — I509 Heart failure, unspecified: Secondary | ICD-10-CM | POA: Diagnosis not present

## 2020-01-30 DIAGNOSIS — Z9989 Dependence on other enabling machines and devices: Secondary | ICD-10-CM

## 2020-01-30 DIAGNOSIS — Z20822 Contact with and (suspected) exposure to covid-19: Secondary | ICD-10-CM | POA: Diagnosis present

## 2020-01-30 DIAGNOSIS — I4819 Other persistent atrial fibrillation: Secondary | ICD-10-CM | POA: Diagnosis present

## 2020-01-30 DIAGNOSIS — I130 Hypertensive heart and chronic kidney disease with heart failure and stage 1 through stage 4 chronic kidney disease, or unspecified chronic kidney disease: Secondary | ICD-10-CM | POA: Diagnosis present

## 2020-01-30 DIAGNOSIS — I5033 Acute on chronic diastolic (congestive) heart failure: Secondary | ICD-10-CM

## 2020-01-30 DIAGNOSIS — J948 Other specified pleural conditions: Secondary | ICD-10-CM | POA: Diagnosis not present

## 2020-01-30 DIAGNOSIS — R54 Age-related physical debility: Secondary | ICD-10-CM | POA: Diagnosis present

## 2020-01-30 DIAGNOSIS — I480 Paroxysmal atrial fibrillation: Secondary | ICD-10-CM | POA: Diagnosis present

## 2020-01-30 DIAGNOSIS — A419 Sepsis, unspecified organism: Secondary | ICD-10-CM | POA: Diagnosis not present

## 2020-01-30 DIAGNOSIS — J9601 Acute respiratory failure with hypoxia: Secondary | ICD-10-CM | POA: Diagnosis present

## 2020-01-30 DIAGNOSIS — E871 Hypo-osmolality and hyponatremia: Secondary | ICD-10-CM | POA: Diagnosis present

## 2020-01-30 DIAGNOSIS — K921 Melena: Secondary | ICD-10-CM | POA: Diagnosis not present

## 2020-01-30 DIAGNOSIS — I5082 Biventricular heart failure: Secondary | ICD-10-CM | POA: Diagnosis present

## 2020-01-30 DIAGNOSIS — N179 Acute kidney failure, unspecified: Secondary | ICD-10-CM | POA: Diagnosis present

## 2020-01-30 DIAGNOSIS — R0600 Dyspnea, unspecified: Secondary | ICD-10-CM

## 2020-01-30 DIAGNOSIS — J90 Pleural effusion, not elsewhere classified: Secondary | ICD-10-CM | POA: Diagnosis present

## 2020-01-30 DIAGNOSIS — D696 Thrombocytopenia, unspecified: Secondary | ICD-10-CM | POA: Diagnosis present

## 2020-01-30 DIAGNOSIS — Z9911 Dependence on respirator [ventilator] status: Secondary | ICD-10-CM | POA: Diagnosis not present

## 2020-01-30 DIAGNOSIS — I252 Old myocardial infarction: Secondary | ICD-10-CM | POA: Diagnosis not present

## 2020-01-30 DIAGNOSIS — Z8719 Personal history of other diseases of the digestive system: Secondary | ICD-10-CM

## 2020-01-30 DIAGNOSIS — E872 Acidosis: Secondary | ICD-10-CM | POA: Diagnosis not present

## 2020-01-30 DIAGNOSIS — Z66 Do not resuscitate: Secondary | ICD-10-CM | POA: Diagnosis not present

## 2020-01-30 DIAGNOSIS — R6521 Severe sepsis with septic shock: Secondary | ICD-10-CM | POA: Diagnosis not present

## 2020-01-30 DIAGNOSIS — D62 Acute posthemorrhagic anemia: Secondary | ICD-10-CM | POA: Diagnosis present

## 2020-01-30 HISTORY — PX: RIGHT HEART CATH: CATH118263

## 2020-01-30 LAB — POCT I-STAT, CHEM 8
BUN: 62 mg/dL — ABNORMAL HIGH (ref 8–23)
Calcium, Ion: 1.13 mmol/L — ABNORMAL LOW (ref 1.15–1.40)
Chloride: 90 mmol/L — ABNORMAL LOW (ref 98–111)
Creatinine, Ser: 3.2 mg/dL — ABNORMAL HIGH (ref 0.61–1.24)
Glucose, Bld: 105 mg/dL — ABNORMAL HIGH (ref 70–99)
HCT: 37 % — ABNORMAL LOW (ref 39.0–52.0)
Hemoglobin: 12.6 g/dL — ABNORMAL LOW (ref 13.0–17.0)
Potassium: 4.8 mmol/L (ref 3.5–5.1)
Sodium: 129 mmol/L — ABNORMAL LOW (ref 135–145)
TCO2: 31 mmol/L (ref 22–32)

## 2020-01-30 LAB — BASIC METABOLIC PANEL
Anion gap: 12 (ref 5–15)
BUN: 69 mg/dL — ABNORMAL HIGH (ref 8–23)
CO2: 25 mmol/L (ref 22–32)
Calcium: 8.7 mg/dL — ABNORMAL LOW (ref 8.9–10.3)
Chloride: 91 mmol/L — ABNORMAL LOW (ref 98–111)
Creatinine, Ser: 2.85 mg/dL — ABNORMAL HIGH (ref 0.61–1.24)
GFR calc Af Amer: 24 mL/min — ABNORMAL LOW (ref >60)
GFR calc non Af Amer: 20 mL/min — ABNORMAL LOW (ref >60)
Glucose, Bld: 149 mg/dL — ABNORMAL HIGH (ref 70–99)
Potassium: 4.3 mmol/L (ref 3.5–5.1)
Sodium: 128 mmol/L — ABNORMAL LOW (ref 135–145)

## 2020-01-30 LAB — POCT I-STAT EG7
Acid-Base Excess: 4 mmol/L — ABNORMAL HIGH (ref 0.0–2.0)
Acid-Base Excess: 4 mmol/L — ABNORMAL HIGH (ref 0.0–2.0)
Bicarbonate: 28.8 mmol/L — ABNORMAL HIGH (ref 20.0–28.0)
Bicarbonate: 29.5 mmol/L — ABNORMAL HIGH (ref 20.0–28.0)
Calcium, Ion: 1.05 mmol/L — ABNORMAL LOW (ref 1.15–1.40)
Calcium, Ion: 1.15 mmol/L (ref 1.15–1.40)
HCT: 37 % — ABNORMAL LOW (ref 39.0–52.0)
HCT: 38 % — ABNORMAL LOW (ref 39.0–52.0)
Hemoglobin: 12.6 g/dL — ABNORMAL LOW (ref 13.0–17.0)
Hemoglobin: 12.9 g/dL — ABNORMAL LOW (ref 13.0–17.0)
O2 Saturation: 76 %
O2 Saturation: 77 %
Potassium: 4.7 mmol/L (ref 3.5–5.1)
Potassium: 5 mmol/L (ref 3.5–5.1)
Sodium: 129 mmol/L — ABNORMAL LOW (ref 135–145)
Sodium: 132 mmol/L — ABNORMAL LOW (ref 135–145)
TCO2: 30 mmol/L (ref 22–32)
TCO2: 31 mmol/L (ref 22–32)
pCO2, Ven: 45.1 mmHg (ref 44.0–60.0)
pCO2, Ven: 46.9 mmHg (ref 44.0–60.0)
pH, Ven: 7.407 (ref 7.250–7.430)
pH, Ven: 7.413 (ref 7.250–7.430)
pO2, Ven: 41 mmHg (ref 32.0–45.0)
pO2, Ven: 41 mmHg (ref 32.0–45.0)

## 2020-01-30 LAB — ECHOCARDIOGRAM LIMITED
Height: 72 in
Weight: 2976 oz

## 2020-01-30 LAB — CBC
HCT: 38.4 % — ABNORMAL LOW (ref 39.0–52.0)
Hemoglobin: 12.3 g/dL — ABNORMAL LOW (ref 13.0–17.0)
MCH: 30.4 pg (ref 26.0–34.0)
MCHC: 32 g/dL (ref 30.0–36.0)
MCV: 95 fL (ref 80.0–100.0)
Platelets: 163 10*3/uL (ref 150–400)
RBC: 4.04 MIL/uL — ABNORMAL LOW (ref 4.22–5.81)
RDW: 22.3 % — ABNORMAL HIGH (ref 11.5–15.5)
WBC: 22.7 10*3/uL — ABNORMAL HIGH (ref 4.0–10.5)
nRBC: 0 % (ref 0.0–0.2)

## 2020-01-30 LAB — BRAIN NATRIURETIC PEPTIDE: B Natriuretic Peptide: 573.5 pg/mL — ABNORMAL HIGH (ref 0.0–100.0)

## 2020-01-30 SURGERY — RIGHT HEART CATH
Anesthesia: LOCAL

## 2020-01-30 MED ORDER — FUROSEMIDE 10 MG/ML IJ SOLN
120.0000 mg | Freq: Two times a day (BID) | INTRAVENOUS | Status: DC
  Administered 2020-01-30 – 2020-01-31 (×2): 120 mg via INTRAVENOUS
  Filled 2020-01-30: qty 10
  Filled 2020-01-30 (×2): qty 12
  Filled 2020-01-30: qty 2

## 2020-01-30 MED ORDER — CHLORHEXIDINE GLUCONATE 0.12 % MT SOLN
15.0000 mL | Freq: Two times a day (BID) | OROMUCOSAL | Status: DC
  Administered 2020-01-30 – 2020-01-31 (×2): 15 mL via OROMUCOSAL
  Filled 2020-01-30: qty 15

## 2020-01-30 MED ORDER — HEPARIN (PORCINE) IN NACL 1000-0.9 UT/500ML-% IV SOLN
INTRAVENOUS | Status: DC | PRN
  Administered 2020-01-30: 500 mL

## 2020-01-30 MED ORDER — LIDOCAINE HCL URETHRAL/MUCOSAL 2 % EX GEL
1.0000 "application " | Freq: Once | CUTANEOUS | Status: AC
  Administered 2020-01-30: 1 via URETHRAL
  Filled 2020-01-30: qty 6

## 2020-01-30 MED ORDER — CHLORHEXIDINE GLUCONATE CLOTH 2 % EX PADS
6.0000 | MEDICATED_PAD | Freq: Every day | CUTANEOUS | Status: DC
  Administered 2020-01-30 – 2020-02-08 (×9): 6 via TOPICAL

## 2020-01-30 MED ORDER — POTASSIUM CHLORIDE CRYS ER 20 MEQ PO TBCR
40.0000 meq | EXTENDED_RELEASE_TABLET | Freq: Two times a day (BID) | ORAL | Status: DC
  Administered 2020-01-30 – 2020-02-01 (×5): 40 meq via ORAL
  Filled 2020-01-30 (×5): qty 2

## 2020-01-30 MED ORDER — ASPIRIN 81 MG PO CHEW: 81.0000 mg | CHEWABLE_TABLET | ORAL | Status: DC

## 2020-01-30 MED ORDER — LIDOCAINE HCL 4 % EX SOLN: Freq: Once | CUTANEOUS | Status: DC

## 2020-01-30 MED ORDER — LIDOCAINE HCL (PF) 1 % IJ SOLN
INTRAMUSCULAR | Status: AC
  Filled 2020-01-30: qty 30

## 2020-01-30 MED ORDER — MILRINONE LACTATE IN DEXTROSE 20-5 MG/100ML-% IV SOLN
0.1250 ug/kg/min | INTRAVENOUS | Status: DC
  Administered 2020-01-30 – 2020-02-02 (×3): 0.125 ug/kg/min via INTRAVENOUS
  Filled 2020-01-30 (×4): qty 100

## 2020-01-30 MED ORDER — FUROSEMIDE 10 MG/ML IJ SOLN
12.0000 mg/h | INTRAVENOUS | Status: DC
  Administered 2020-01-30 – 2020-01-31 (×3): 25 mg/h via INTRAVENOUS
  Administered 2020-02-01 (×3): 10 mg/h via INTRAVENOUS
  Administered 2020-02-02 – 2020-02-03 (×2): 12 mg/h via INTRAVENOUS
  Filled 2020-01-30 (×6): qty 25
  Filled 2020-01-30: qty 20
  Filled 2020-01-30: qty 25

## 2020-01-30 MED ORDER — FUROSEMIDE 10 MG/ML IJ SOLN
80.0000 mg | Freq: Two times a day (BID) | INTRAMUSCULAR | Status: DC
  Administered 2020-01-30: 12:00:00 80 mg via INTRAVENOUS
  Filled 2020-01-30: qty 8

## 2020-01-30 MED ORDER — ORAL CARE MOUTH RINSE: 15.0000 mL | Freq: Two times a day (BID) | OROMUCOSAL | Status: DC

## 2020-01-30 MED ORDER — HEPARIN (PORCINE) IN NACL 1000-0.9 UT/500ML-% IV SOLN
INTRAVENOUS | Status: AC
  Filled 2020-01-30: qty 500

## 2020-01-30 MED ORDER — SODIUM CHLORIDE 0.9% FLUSH
3.0000 mL | Freq: Two times a day (BID) | INTRAVENOUS | Status: DC
  Administered 2020-01-30: 22:00:00 3 mL via INTRAVENOUS

## 2020-01-30 MED ORDER — SODIUM CHLORIDE 0.9 % IV SOLN
250.0000 mL | INTRAVENOUS | Status: DC | PRN
  Administered 2020-01-30 – 2020-01-31 (×2): 250 mL via INTRAVENOUS

## 2020-01-30 MED ORDER — LIDOCAINE HCL (PF) 1 % IJ SOLN
INTRAMUSCULAR | Status: DC | PRN
  Administered 2020-01-30: 30 mL

## 2020-01-30 MED ORDER — HEPARIN SODIUM (PORCINE) 1000 UNIT/ML IJ SOLN
INTRAMUSCULAR | Status: AC
  Filled 2020-01-30: qty 1

## 2020-01-30 MED ORDER — HEPARIN SODIUM (PORCINE) 1000 UNIT/ML IJ SOLN
INTRAMUSCULAR | Status: DC | PRN
  Administered 2020-01-30 (×2): 1200 [IU] via INTRAVENOUS

## 2020-01-30 MED ORDER — METOLAZONE 2.5 MG PO TABS
2.5000 mg | ORAL_TABLET | Freq: Every day | ORAL | Status: DC
  Administered 2020-01-30 – 2020-02-01 (×3): 2.5 mg via ORAL
  Filled 2020-01-30 (×3): qty 1

## 2020-01-30 MED ORDER — SODIUM CHLORIDE 0.9 % IV SOLN: INTRAVENOUS | Status: DC

## 2020-01-30 MED ORDER — AMIODARONE HCL 200 MG PO TABS
200.0000 mg | ORAL_TABLET | Freq: Every day | ORAL | Status: DC
  Administered 2020-01-31 – 2020-02-01 (×2): 200 mg via ORAL
  Filled 2020-01-30 (×2): qty 1

## 2020-01-30 MED ORDER — SODIUM CHLORIDE 0.9% FLUSH: 3.0000 mL | INTRAVENOUS | Status: DC | PRN

## 2020-01-30 SURGICAL SUPPLY — 10 items
CATH SWAN GANZ VIP 7.5F (CATHETERS) ×1 IMPLANT
CATH TRIALYSIS 20CM 13F 3LUM (CATHETERS) ×1 IMPLANT
CATH-GARD ARROW CATH SHIELD (MISCELLANEOUS) ×2
KIT MICROPUNCTURE NIT STIFF (SHEATH) ×1 IMPLANT
PACK CARDIAC CATHETERIZATION (CUSTOM PROCEDURE TRAY) ×2 IMPLANT
SHEATH PINNACLE 8F 10CM (SHEATH) ×1 IMPLANT
SHEATH PROBE COVER 6X72 (BAG) ×1 IMPLANT
SHIELD CATHGARD ARROW (MISCELLANEOUS) IMPLANT
SLEEVE REPOSITIONING LENGTH 30 (MISCELLANEOUS) ×1 IMPLANT
TRANSDUCER W/STOPCOCK (MISCELLANEOUS) ×2 IMPLANT

## 2020-01-30 NOTE — Progress Notes (Signed)
Dr Haroldine Laws here and in to see pt. Aware of bloody urine in foley and decr urine output. Will incr lasix gtt to 25 mg/hr.

## 2020-01-30 NOTE — Progress Notes (Signed)
Echocardiogram 2D Echocardiogram has been performed.  Larry [name] 02/05/2020, 8:54 AM

## 2020-01-30 NOTE — Progress Notes (Addendum)
[place] VALVE TEAM  Patient Name: Larry [name] Date of Encounter: 01/31/2020  Primary Cardiologist: Dr. Selmer Dominion Problem List     Principal Problem:   S/P atrial septal defect closure Active Problems:   Hypertension   Hypercholesterolemia   Coronary artery disease   Chronic kidney disease (CKD), stage IV (severe) (HCC)   Secundum ASD   ASD (atrial septal defect)   History of GI bleed   Persistent atrial fibrillation (HCC)   Acute on chronic diastolic heart failure ([place])     Subjective   Having some worsening dyspnea. Legs more swollen. Has not been up to walk around. Wife and daughter are in the room. Pt has had some hypoxia on room air  Inpatient Medications    Scheduled Meds: . amiodarone  200 mg Oral BID  . apixaban  5 mg Oral BID  . aspirin EC  81 mg Oral Daily  . ferrous sulfate  325 mg Oral BID WC  . furosemide  80 mg Intravenous BID  . midodrine  5 mg Oral TID WC  . pantoprazole  40 mg Oral BID  . pravastatin  40 mg Oral QPM  . sodium chloride flush  3 mL Intravenous Q12H   Continuous Infusions:  PRN Meds: acetaminophen, nitroGLYCERIN, ondansetron (ZOFRAN) IV, oxyCODONE, polyethylene glycol   Vital Signs    Vitals:   01/12/2020 0459 01/15/2020 0500 01/08/2020 0600 01/16/2020 0800  BP: 107/68 107/68 107/69 100/64  Pulse: 73 76 72 67  Resp: 20     Temp: 97.9 F (36.6 C)   97.6 F (36.4 C)  TempSrc: Oral   Oral  SpO2: 92% 93% 90% 96%  Weight:      Height:        Intake/Output Summary (Last 24 hours) at 01/23/2020 1143 Last data filed at 02/02/2020 1857 Gross per 24 hour  Intake 857.7 ml  Output --  Net 857.7 ml   Filed Weights   01/10/2020 0543  Weight: 84.4 kg    Physical Exam   GEN: Well nourished, well developed, in no acute distress.  HEENT: Grossly normal.  Neck: Supple, +JVD, carotid bruits, or masses. Cardiac: RRR, no murmurs, rubs, or gallops. No clubbing, cyanosis. 2+ bilateral  pitting edema up to knees Respiratory:  Some mild wheezes with decreased breath sounds at bases GI: Soft, nontender, nondistended, BS + x 4. MS: no deformity or atrophy. Skin: warm and dry, no rash.  Groin site clear without hematoma or ecchymosis  Neuro:  Strength and sensation are intact. Psych: AAOx3.  Normal affect.  Labs    CBC No results for input(s): WBC, NEUTROABS, HGB, HCT, MCV, PLT in the last 72 hours. Basic Metabolic Panel No results for input(s): NA, K, CL, CO2, GLUCOSE, BUN, CREATININE, CALCIUM, MG, PHOS in the last 72 hours. Liver Function Tests No results for input(s): AST, ALT, ALKPHOS, BILITOT, PROT, ALBUMIN in the last 72 hours. No results for input(s): LIPASE, AMYLASE in the last 72 hours. Cardiac Enzymes No results for input(s): CKTOTAL, CKMB, CKMBINDEX, TROPONINI in the last 72 hours. BNP Invalid input(s): POCBNP D-Dimer No results for input(s): DDIMER in the last 72 hours. Hemoglobin A1C No results for input(s): HGBA1C in the last 72 hours. Fasting Lipid Panel No results for input(s): CHOL, HDL, LDLCALC, TRIG, CHOLHDL, LDLDIRECT in the last 72 hours. Thyroid Function Tests No results for input(s): TSH, T4TOTAL, T3FREE, THYROIDAB in the last 72 hours.  Invalid input(s): [employer]  Telemetry  Sinus with artifact - Personally Reviewed  ECG    None this admission  - Personally Reviewed  Radiology    CARDIAC CATHETERIZATION  Result Date: 01/10/2020 Successful transcatheter closure of a large secundum ASD using a 26 mm Amplatzer septal occluder device under intracardiac echo and fluoroscopic guidance  ECHOCARDIOGRAM LIMITED  Result Date: 02/02/2020    ECHOCARDIOGRAM LIMITED REPORT   Patient Name:   Larry [name] Date of Exam: 01/20/2020 Medical Rec #:  [id number]     Height:       72.0 in Accession #:    [id number]    Weight:       186.0 lb Date of Birth:  Aug 27, 1941     BSA:          2.066 m Patient Age:    79 years      BP:           100/64 mmHg Patient  Gender: M             HR:           72 bpm. Exam Location:  Inpatient Procedure: Limited Echo and Color Doppler Indications:    Post ASD-Closure Q21.1  History:        Patient has prior history of Echocardiogram examinations, most                 recent 01/14/2020. CHF, CAD, Arrythmias:Atrial Fibrillation; Risk                 Factors:Hypertension and Dyslipidemia. Secundum ASD closure on                 01/09/2020 with 24mm Amplatzer.  Sonographer:    Raquel Sarna Senior RDCS Referring Phys: [place]  1. Left ventricular ejection fraction, by estimation, is 60 to 65%. The left ventricle has normal function. The left ventricle has no regional wall motion abnormalities.  2. Right ventricular systolic function is normal. The right ventricular size is normal.  3. Post ASD closure with 26 mm Amplatzer with no residual flow noted.  4. The mitral valve is normal in structure. Trivial mitral valve regurgitation. No evidence of mitral stenosis.  5. The aortic valve is tricuspid. Aortic valve regurgitation is not visualized. Mild to moderate aortic valve sclerosis/calcification is present, without any evidence of aortic stenosis.  6. The inferior vena cava is dilated in size with >50% respiratory variability, suggesting right atrial pressure of 8 mmHg. FINDINGS  Left Ventricle: Left ventricular ejection fraction, by estimation, is 60 to 65%. The left ventricle has normal function. The left ventricle has no regional wall motion abnormalities. The left ventricular internal cavity size was normal in size. There is  no left ventricular hypertrophy. Right Ventricle: The right ventricular size is normal. No increase in right ventricular wall thickness. Right ventricular systolic function is normal. Left Atrium: Left atrial size was not assessed. Right Atrium: Right atrial size was not assessed. Pericardium: There is no evidence of pericardial effusion. Mitral Valve: The mitral valve is normal in structure. Normal  mobility of the mitral valve leaflets. Moderate mitral annular calcification. Trivial mitral valve regurgitation. No evidence of mitral valve stenosis. Tricuspid Valve: The tricuspid valve is normal in structure. Tricuspid valve regurgitation is not demonstrated. No evidence of tricuspid stenosis. Aortic Valve: The aortic valve is tricuspid. Aortic valve regurgitation is not visualized. Mild to moderate aortic valve sclerosis/calcification is present, without any evidence of aortic stenosis. Pulmonic Valve: The pulmonic valve was normal in structure.  Pulmonic valve regurgitation is not visualized. No evidence of pulmonic stenosis. Aorta: The aortic root is normal in size and structure. Venous: The inferior vena cava is dilated in size with greater than 50% respiratory variability, suggesting right atrial pressure of 8 mmHg. IAS/Shunts: No atrial level shunt detected by color flow Doppler. Jenkins Rouge MD Electronically signed by Jenkins Rouge MD Signature Date/Time: 01/21/2020/9:05:38 AM    Final     Cardiac Studies   01/27/2020 ATRIAL SEPTAL DEFECT (ASD) CLOSURE  Conclusion  Successful transcatheter closure of a large secundum ASD using a 26 mm Amplatzer septal occluder device under intracardiac echo and fluoroscopic guidance   ______________   Echo 02/06/2020 IMPRESSIONS  1. Left ventricular ejection fraction, by estimation, is 60 to 65%. The  left ventricle has normal function. The left ventricle has no regional  wall motion abnormalities.  2. Right ventricular systolic function is normal. The right ventricular  size is normal.  3. Post ASD closure with 26 mm Amplatzer with no residual flow noted.  4. The mitral valve is normal in structure. Trivial mitral valve  regurgitation. No evidence of mitral stenosis.  5. The aortic valve is tricuspid. Aortic valve regurgitation is not  visualized. Mild to moderate aortic valve sclerosis/calcification is  present, without any evidence of aortic  stenosis.  6. The inferior vena cava is dilated in size with >50% respiratory  variability, suggesting right atrial pressure of 8 mmHg.   Patient Profile     Larry [name] a 79 y.o.malewith a history of CAD, paroxysmal afib/flutter on Xarelto (previously on Tikosyn), HTN, iron deficiency anemia, OSA on CPAP, CKD stage IV, recent GI bleed, chronic diastolic CHF and ASD with RV dysfunction who presented to [facility] on 01/12/2020 for planned ASD closure.  Assessment & Plan    ASD s/p closure: s/p successful transcatheter closure of a large secundum ASD using a 26 mm Amplatzer septal occluder device on 01/20/2020. Groin site healing well. Post op limited echo showed EF 60-65% with well seated ASD closure device with no residual flow. He will be discharged on home Eliquis alone given recent GI bleed with acute blood loss anemia.   Acute on chronic diastolic CHF: he has worsening LE edema, elevated JVD, orthopnea, hypoxia (02 sats 80s on RA) and dilated IVC on echo today. Will start IV lasix 80 mg BID and Kdur 51mEq BID. He will need to stay until at least tomorrow. He will be seen by Dr. Haroldine Laws with the advanced heart failure service.   CKD stage IV: creat baseline 2.05- 2.5. Recently up to 2.70 (4/15) and he was instructed to hold Torsemide for one day and then decrease Torsemide from 80mg  in AM and 40mg  in PM to 80 mg daily. Creat up to 2.75 on pre admission blood work. Will recheck a BMET today.   Paroxysmal aifb/flutter: maintaining sinus by tele. Resumed on Eliquis 5mg  BID (switched from Xarelto in 12/2019 due to GI bleed). Continue on amiodarone. Currently on amio 200mg  BID although per review of notes, Ellen Henri reduced it to 200mg  daily on 4/15. I will reduce dose now. No longer on metoprolol due to hypotension. Now on midodrine   CAD: s/p remote PCI to RCA, had stent thrombosis back in 2000.No chest pain. Continue medical therapy. No aspirin given need for Eliquis and recent GI  bleed  Recent GI bleed with chronic anemia: HG has remained stable ~ 12. Repeat labs pending   OSA: daughter will bring in his CPAP machine  Signed, Curt Bears  Billee Cashing  01/29/2020, 11:43 AM  Pager [phone]  Patient seen, examined. Available data reviewed. Agree with findings, assessment, and plan as outlined by Nell Range, PA-C.  The patient is independently interviewed and examined.  His wife and daughter at the bedside.  JVP is elevated, lungs are rhonchorous bilaterally, heart is regular rate and rhythm with a 2/6 systolic murmur at the left upper sternal border, abdomen is soft and nontender, extremities have pitting edema from the thighs to the ankles.  The patient's echocardiogram is reviewed and demonstrates appropriate position of his atrial septal occluder.  The IVC is very dilated and suggestive of elevated RA pressure.  I suspect the patient has significant diastolic dysfunction and is having acute on chronic diastolic heart failure and right heart failure after undergoing ASD closure yesterday.  His RV function actually looks pretty good on echo.  Will diurese him with IV Lasix 80 mg twice daily.  Will follow response to IV diuretics.  Otherwise as outlined above.  We will have to balance diuresis with his advanced kidney disease.  Discontinue aspirin to lower bleeding risk.  Continue apixaban.  Sherren Mocha, M.D. 01/12/2020 1:33 PM

## 2020-01-30 NOTE — Discharge Summary (Deleted)
[place] VALVE TEAM  Discharge Summary    Patient ID: Larry [name] MRN: [id number]; DOB: 03-07-1941  Admit date: 01/20/2020 Discharge date: 01/20/2020  Primary Care Provider: Celene Squibb, MD  Primary Cardiologist: Dr. Aundra Dubin   Discharge Diagnoses    Principal Problem:   S/P atrial septal defect closure Active Problems:   Hypertension   Hypercholesterolemia   Coronary artery disease   Chronic kidney disease (CKD), stage IV (severe) ([place])   Secundum ASD   ASD (atrial septal defect)   History of GI bleed   Persistent atrial fibrillation (HCC)   Allergies Allergies  Allergen Reactions  . Plavix [Clopidogrel Bisulfate] Anaphylaxis         Diagnostic Studies/Procedures    01/15/2020 Procedures ATRIAL SEPTAL DEFECT (ASD) CLOSURE  Conclusion  Successful transcatheter closure of a large secundum ASD using a 26 mm Amplatzer septal occluder device under intracardiac echo and fluoroscopic guidance   _____________  Limited Echo 02/01/2020 IMPRESSIONS  1. Left ventricular ejection fraction, by estimation, is 60 to 65%. The  left ventricle has normal function. The left ventricle has no regional  wall motion abnormalities.  2. Right ventricular systolic function is normal. The right ventricular  size is normal.  3. Post ASD closure with 26 mm Amplatzer with no residual flow noted.  4. The mitral valve is normal in structure. Trivial mitral valve  regurgitation. No evidence of mitral stenosis.  5. The aortic valve is tricuspid. Aortic valve regurgitation is not  visualized. Mild to moderate aortic valve sclerosis/calcification is  present, without any evidence of aortic stenosis.  6. The inferior vena cava is dilated in size with >50% respiratory  variability, suggesting right atrial pressure of 8 mmHg.     History of Present Illness     Larry [name] is a 79 y.o. male with a history of CAD, persistent afib/flutter on  Xarelto (previously on Tikosyn) , HTN, iron deficiency anemia, OSA on CPAP, CKD stage IV, recent GI bleed, and ASD who presented to [facility] on 01/10/2020 for planned ASD closure.   Patient has a history of CAD with initial PCI to RCA in 11/1998. This was complicated by stent thrombosis with inferior STEMI in 08/1999 requiring PCI. In 2017, he was noted to be in atrial fibrillation persistently.He decided not to undergo cardioversion. Echo in 02/2018 showed EF 50-55% with severe biatrial enlargement.   He was admitted in 03/2018 for acute CHF and IV diuresis. He was started on Tikosyn while in hospital with conversion to NSR. Mr. Robarts was followed by Dr. Johnsie Cancel for a long time and referred to Dr. Aundra Dubin in 04/2018 for management of CHF. He has also been closely followed in the afib clinic by Roderic Palau NP. He was previously treated with Tikosyn but this was discontinued due to prolonged QT.  He was admitted in 12/2019 for acute GI bleed, afib with RVR and acute CHF. Echo this admission showed 60-65% with D-shaped septum, mild-moderate RV dilation and normal RV systolic function, IVC dilated.RHC suggestive of ASD with high shunt fraction and TEE (3/18) confirmed large secundum ASD. The structural heart team was consulted for consideration of ASD closure, which was set up for 01/12/2020.   Hospital Course     Consultants: none   He underwent successful transcatheter closure of a large secundum ASD using a 26 mm Amplatzer septal occluder device on 02/04/2020. Groin site healing well. Post op limited echo showed EF 60-65% with well seated ASD closure device  with no residual flow. He will be discharged on home Eliquis alone given recent GI bleed with acute blood loss anemia. He has a previously scheduled apt with Dr. Aundra Dubin on 03/04/20, which he will keep.  _____________  Discharge Vitals Blood pressure 100/64, pulse 67, temperature 97.6 F (36.4 C), temperature source Oral, resp. rate 20, height 6' (1.829 m),  weight 84.4 kg, SpO2 96 %.  Filed Weights   01/10/2020 0543  Weight: 84.4 kg    Labs & Radiologic Studies    CBC Recent Labs    01/27/20 1049  WBC 12.1*  NEUTROABS 9.9*  HGB 12.3*  HCT 36.6*  MCV 88  PLT 169   Basic Metabolic Panel Recent Labs    01/27/20 1049  NA 125*  K 3.9  CL 87*  CO2 30*  GLUCOSE 138*  BUN 74*  CREATININE 2.75*  CALCIUM 9.3   Liver Function Tests No results for input(s): AST, ALT, ALKPHOS, BILITOT, PROT, ALBUMIN in the last 72 hours. No results for input(s): LIPASE, AMYLASE in the last 72 hours. Cardiac Enzymes No results for input(s): CKTOTAL, CKMB, CKMBINDEX, TROPONINI in the last 72 hours. BNP Invalid input(s): POCBNP D-Dimer No results for input(s): DDIMER in the last 72 hours. Hemoglobin A1C No results for input(s): HGBA1C in the last 72 hours. Fasting Lipid Panel No results for input(s): CHOL, HDL, LDLCALC, TRIG, CHOLHDL, LDLDIRECT in the last 72 hours. Thyroid Function Tests No results for input(s): TSH, T4TOTAL, T3FREE, THYROIDAB in the last 72 hours.  Invalid input(s): FREET3 _____________  CARDIAC CATHETERIZATION  Result Date: 01/18/2020 Successful transcatheter closure of a large secundum ASD using a 26 mm Amplatzer septal occluder device under intracardiac echo and fluoroscopic guidance  ECHOCARDIOGRAM LIMITED  Result Date: 01/31/2020    ECHOCARDIOGRAM LIMITED REPORT   Patient Name:   Larry [name] Date of Exam: 01/15/2020 Medical Rec #:  [id number]     Height:       72.0 in Accession #:    [id number]    Weight:       186.0 lb Date of Birth:  1941-09-16     BSA:          2.066 m Patient Age:    79 years      BP:           100/64 mmHg Patient Gender: M             HR:           72 bpm. Exam Location:  Inpatient Procedure: Limited Echo and Color Doppler Indications:    Post ASD-Closure Q21.1  History:        Patient has prior history of Echocardiogram examinations, most                 recent 01/14/2020. CHF, CAD, Arrythmias:Atrial  Fibrillation; Risk                 Factors:Hypertension and Dyslipidemia. Secundum ASD closure on                 01/23/2020 with 18mm Amplatzer.  Sonographer:    Raquel Sarna Senior RDCS Referring Phys: [place]  1. Left ventricular ejection fraction, by estimation, is 60 to 65%. The left ventricle has normal function. The left ventricle has no regional wall motion abnormalities.  2. Right ventricular systolic function is normal. The right ventricular size is normal.  3. Post ASD closure with 26 mm Amplatzer with no residual flow noted.  4. The  mitral valve is normal in structure. Trivial mitral valve regurgitation. No evidence of mitral stenosis.  5. The aortic valve is tricuspid. Aortic valve regurgitation is not visualized. Mild to moderate aortic valve sclerosis/calcification is present, without any evidence of aortic stenosis.  6. The inferior vena cava is dilated in size with >50% respiratory variability, suggesting right atrial pressure of 8 mmHg. FINDINGS  Left Ventricle: Left ventricular ejection fraction, by estimation, is 60 to 65%. The left ventricle has normal function. The left ventricle has no regional wall motion abnormalities. The left ventricular internal cavity size was normal in size. There is  no left ventricular hypertrophy. Right Ventricle: The right ventricular size is normal. No increase in right ventricular wall thickness. Right ventricular systolic function is normal. Left Atrium: Left atrial size was not assessed. Right Atrium: Right atrial size was not assessed. Pericardium: There is no evidence of pericardial effusion. Mitral Valve: The mitral valve is normal in structure. Normal mobility of the mitral valve leaflets. Moderate mitral annular calcification. Trivial mitral valve regurgitation. No evidence of mitral valve stenosis. Tricuspid Valve: The tricuspid valve is normal in structure. Tricuspid valve regurgitation is not demonstrated. No evidence of tricuspid stenosis.  Aortic Valve: The aortic valve is tricuspid. Aortic valve regurgitation is not visualized. Mild to moderate aortic valve sclerosis/calcification is present, without any evidence of aortic stenosis. Pulmonic Valve: The pulmonic valve was normal in structure. Pulmonic valve regurgitation is not visualized. No evidence of pulmonic stenosis. Aorta: The aortic root is normal in size and structure. Venous: The inferior vena cava is dilated in size with greater than 50% respiratory variability, suggesting right atrial pressure of 8 mmHg. IAS/Shunts: No atrial level shunt detected by color flow Doppler. Jenkins Rouge MD Electronically signed by Jenkins Rouge MD Signature Date/Time: 01/24/2020/9:05:38 AM    Final    Disposition   Pt is being discharged home today in good condition.  Follow-up Plans & Appointments    Follow-up Information    Larey Dresser, MD. Go on 03/04/2020.   Specialty: Cardiology Why: @ 2pm Contact information: [address] [place] [address] [phone]            Discharge Medications   Allergies as of 01/28/2020      Reactions   Plavix [clopidogrel Bisulfate] Anaphylaxis         Medication List    STOP taking these medications   aspirin EC 81 MG tablet     TAKE these medications   acetaminophen 500 MG tablet Commonly known as: TYLENOL Take 1,000 mg by mouth as needed for moderate pain (back pain).   amiodarone 200 MG tablet Commonly known as: PACERONE Take 1 tablet (200 mg total) by mouth 2 (two) times daily.   apixaban 5 MG Tabs tablet Commonly known as: ELIQUIS Take 1 tablet (5 mg total) by mouth 2 (two) times daily.   ferrous sulfate 325 (65 FE) MG tablet Take 1 tablet (325 mg total) by mouth 2 (two) times daily with a meal.   loratadine 10 MG tablet Commonly known as: CLARITIN Take 10 mg by mouth daily as needed for allergies or itching.   midodrine 5 MG tablet Commonly known as: PROAMATINE Take 1 tablet (5 mg total)  by mouth 3 (three) times daily with meals.   nitroGLYCERIN 0.4 MG SL tablet Commonly known as: NITROSTAT Place 1 tablet (0.4 mg total) under the tongue every 5 (five) minutes as needed for chest pain (3 doses max).   pantoprazole  40 MG tablet Commonly known as: Protonix Take 1 tablet (40 mg total) by mouth 2 (two) times daily.   polyethylene glycol powder 17 GM/SCOOP powder Commonly known as: MiraLax Take 17 g by mouth 2 (two) times daily as needed for moderate constipation.   potassium chloride SA 20 MEQ tablet Commonly known as: KLOR-CON Take 20 mEq by mouth daily.   pravastatin 40 MG tablet Commonly known as: PRAVACHOL Take 1 tablet (40 mg total) by mouth every evening.   torsemide 20 MG tablet Commonly known as: DEMADEX Take 4 tablets (80 mg total) by mouth daily.           Outstanding Labs/Studies   none  Duration of Discharge Encounter   Greater than 30 minutes including physician time.  SignedAngelena Form, PA-C 01/14/2020, 10:20 AM [phone]

## 2020-01-30 NOTE — Progress Notes (Signed)
Foley catheter insertion attempted twice by two RN and was unsuccessful. Order obtained for coude insertion.  Clyde Canterbury, RN

## 2020-01-30 NOTE — Consult Note (Addendum)
Advanced Heart Failure Team Consult Note   Primary Physician: Celene Squibb, MD PCP-Cardiologist:  No primary care provider on file.  Reason for Consultation: A/C heart failure  HPI:    Larry [name] is seen today for evaluation of A/C diastolic heart failure at the request of Dr Burt Knack.   Larry [name] is a 79 y.o. male who has a history of CAD, PAF, anemia, CAD,  and chronic diastolic CHF. Patient had initial PCI to RCA in 11/1998. This was complicated by stent thrombosis with inferior STEMI in 08/1999 requiring PCI. In 2017, he was noted to be in atrial fibrillation persistently. He decided not to undergo cardioversion. Echo in 5/19 showed EF 50-55%, severe biatrial enlargement.   He was doing well until around 5/19. At that time, he noted his HR running higher and he developed peripheral edema. He was started on torsemide. He was admitted in 6/19 for diuresis (acute/chronic diastolic CHF).   He was started on Tikosyn and went back into NSR.At a prior appointment with Dr Aundra Dubin, he was noted to be in atrial fibrillation with prolonged QT interval. Dr. Rayann Heman and decreased Tikosyn to 125 mcg bid.  By 11/15/18, he was back in NSR, QTc ok.  In early 3/20, he noted low BP and lightheadedness. At that time metoprolol and spironolactone stopped.     In August CBC was checked, hgb was 7.6.  Torsemide was also increased due to concern for volume overload.  He has not had melena or BRBPR.  GI referral was recommended, he has never had a colonoscopy.  However, he declined this.  He thinks that his blood loss came from extensive bruising from a fall that he had had.  He was started on oral iron.  Hemoglobin as trended up, it was 11.3 in 9/20.   Admitted with volume overload. Hospital course complicated by anemia and A fib. Hgb dropped so GI consulted. Had scope that showed several polyps, diverticulosis and hemrrhoids. EGD showed non bleeding ulcer. Also had RHC that showed ASD. TEE showed  large secundum ASD with left to right flow, 2.9 X 1.1 cm. There do appear to be adequate rims for closure. Dr Burt Knack consulted with plans to close on 02/06/2020. Also placed on midodrine 5 mg tid for ongoing hypotension. Discharge weight 186 pounds.   He returned for post hospital follow up on 01/15/20. He had marked volume overload and was referred [facility]. He received a few doses of IV lasix at home. Weight at that time was 194 pounds. He was seen the next week and his weight had gone down 7 pounds.   Admitted 4/22 for scheduled ASD closure. Had successful ASD closure. Due to volume overload he was started on IV lasix. Minimal urine output. Bladder scan with > 300 cc urine.    Pertinent labs today: BNP 573,  Sodium 128, and creatinine 2.8.   Today he is requiring additional oxygen. Oxygen saturations down to 85%.  Now on 5 liters oxygen with sats > 90%.    Standing weight 191 pounds.  Complaining of fatigue and nausea. Poor appetite.    Echo 01/25/2020  1. Left ventricular ejection fraction, by estimation, is 60 to 65%. The left ventricle has normal function. The left ventricle has no regional wall motion abnormalities. 2. Right ventricular systolic function is normal. The right ventricular size is normal. 3. Post ASD closure with 26 mm Amplatzer with no residual flow noted. 4. The mitral valve is normal in structure. Trivial  mitral valve regurgitation. No evidence of mitral stenosis. 5. The aortic valve is tricuspid. Aortic valve regurgitation is not visualized. Mild to moderate aortic valve sclerosis/calcification is present, without any evidence of aortic stenosis. 6. The inferior vena cava is dilated in size with >50%  Review of Systems: [y] = yes, [ ]  = no   . General: Weight gain [Y ]; Weight loss [ ] ; Anorexia [ ] ; Fatigue [Y ]; Fever [ ] ; Chills [ ] ; Weakness [Y ]  . Cardiac: Chest pain/pressure [ ] ; Resting SOB [ ] ; Exertional SOB [ ] ; Orthopnea [ ] ; Pedal Edema [ ] ;  Palpitations [ ] ; Syncope [ ] ; Presyncope [ ] ; Paroxysmal nocturnal dyspnea[ ]   . Pulmonary: Cough [ ] ; Wheezing[ ] ; Hemoptysis[ ] ; Sputum [ ] ; Snoring [ ]   . GI: Vomiting[ ] ; Dysphagia[ ] ; Melena[ ] ; Hematochezia [ ] ; Heartburn[ ] ; Abdominal pain [ ] ; Constipation [ ] ; Diarrhea [ ] ; BRBPR [ ]   . GU: Hematuria[ ] ; Dysuria [ ] ; Nocturia[ ]   . Vascular: Pain in legs with walking [ ] ; Pain in feet with lying flat [ ] ; Non-healing sores [ ] ; Stroke [ ] ; TIA [ ] ; Slurred speech [ ] ;  . Neuro: Headaches[ ] ; Vertigo[ ] ; Seizures[ ] ; Paresthesias[ ] ;Blurred vision [ ] ; Diplopia [ ] ; Vision changes [ ]   . Ortho/Skin: Arthritis [ ] ; Joint pain [ Y]; Muscle pain [ ] ; Joint swelling [ ] ; Back Pain [Y ]; Rash [ ]   . Psych: Depression[ ] ; Anxiety[ ]   . Heme: Bleeding problems [ ] ; Clotting disorders [ ] ; Anemia [ ]   . Endocrine: Diabetes [ ] ; Thyroid dysfunction[ ]   Home Medications Prior to Admission medications   Medication Sig Start Date End Date Taking? Authorizing Provider  acetaminophen (TYLENOL) 500 MG tablet Take 1,000 mg by mouth as needed for moderate pain (back pain).    Yes [provider]  amiodarone (PACERONE) 200 MG tablet Take 1 tablet (200 mg total) by mouth 2 (two) times daily. 12/30/19  Yes Mercy Riding, MD  apixaban (ELIQUIS) 5 MG TABS tablet Take 1 tablet (5 mg total) by mouth 2 (two) times daily. 01/04/20  Yes Mercy Riding, MD  aspirin EC 81 MG tablet Take 1 tablet (81 mg total) by mouth daily. 01/26/20  Yes Sherren Mocha, MD  ferrous sulfate 325 (65 FE) MG tablet Take 1 tablet (325 mg total) by mouth 2 (two) times daily with a meal. 01/08/20 07/06/20 Yes Gonfa, Charlesetta Ivory, MD  loratadine (CLARITIN) 10 MG tablet Take 10 mg by mouth daily as needed for allergies or itching.    Yes [provider]  midodrine (PROAMATINE) 5 MG tablet Take 1 tablet (5 mg total) by mouth 3 (three) times daily with meals. 12/30/19  Yes Mercy Riding, MD  pantoprazole (PROTONIX) 40 MG tablet Take 1  tablet (40 mg total) by mouth 2 (two) times daily. 12/30/19 06/27/20 Yes Mercy Riding, MD  polyethylene glycol powder (MIRALAX) 17 GM/SCOOP powder Take 17 g by mouth 2 (two) times daily as needed for moderate constipation. 12/30/19  Yes Mercy Riding, MD  potassium chloride SA (KLOR-CON) 20 MEQ tablet Take 20 mEq by mouth daily.   Yes [provider]  pravastatin (PRAVACHOL) 40 MG tablet Take 1 tablet (40 mg total) by mouth every evening. 12/04/19  Yes Clegg, Amy D, NP  torsemide (DEMADEX) 20 MG tablet Take 4 tablets (80 mg total) by mouth daily. 01/22/20  Yes Lyda Jester M, PA-C  nitroGLYCERIN (NITROSTAT)  0.4 MG SL tablet Place 1 tablet (0.4 mg total) under the tongue every 5 (five) minutes as needed for chest pain (3 doses max). 02/19/18   Imogene Burn, PA-C    Past Medical History: Past Medical History:  Diagnosis Date  . Coronary artery disease    status post remote PCI of the mid to distal RCA 11/1998 followed by acute ST elevation MI inferiorly 08/1999 with occlusion of the prior RCA stent as well as 70% diagonal branch.  He underwent PCI of the RCA  . Hypercholesterolemia   . Hypertension   . Myocardial infarct ([place])   . Olecranon bursitis   . S/P atrial septal defect closure    Successful transcatheter closure of a large secundum ASD using a 26 mm Amplatzer septal occluder device     Past Surgical History: Past Surgical History:  Procedure Laterality Date  . ATRIAL SEPTAL DEFECT(ASD) CLOSURE N/A 02/03/2020   Procedure: ATRIAL SEPTAL DEFECT (ASD) CLOSURE;  Surgeon: Sherren Mocha, MD;  Location: [place] [facility];  Service: Cardiovascular;  Laterality: N/A;  . CARDIAC CATHETERIZATION  12/15/1999   EF was 55%   . COLONOSCOPY WITH PROPOFOL N/A 12/28/2019   Procedure: COLONOSCOPY WITH PROPOFOL;  Surgeon: Arta Silence, MD;  Location: [place];  Service: Endoscopy;  Laterality: N/A;  . CORONARY ANGIOPLASTY WITH STENT PLACEMENT  08/15/1999   CAD, status post prior  stenting of the mid to distal RCA in 11/1998, with an acute diaphragmatic wall infarction due to total occlusion at the stent site/There is also 70% narrowing in the diagonal branch of the LAD/The LV showed inferior wall hypo- akinesis.-- Successful reperfusion, percutaneous transluminal coronary percutaneous transluminal coronary & placement of a 2nd overlying stent in RCA  . ESOPHAGOGASTRODUODENOSCOPY (EGD) WITH PROPOFOL N/A 12/28/2019   Procedure: ESOPHAGOGASTRODUODENOSCOPY (EGD) WITH PROPOFOL;  Surgeon: Arta Silence, MD;  Location: [place];  Service: Endoscopy;  Laterality: N/A;  . POLYPECTOMY  12/28/2019   Procedure: POLYPECTOMY;  Surgeon: Arta Silence, MD;  Location: [facility] ENDOSCOPY;  Service: Endoscopy;;  . RIGHT HEART CATH N/A 12/25/2019   Procedure: RIGHT HEART CATH;  Surgeon: Larey Dresser, MD;  Location: [place] [facility];  Service: Cardiovascular;  Laterality: N/A;  . TEE WITHOUT CARDIOVERSION N/A 12/25/2019   Procedure: TRANSESOPHAGEAL ECHOCARDIOGRAM (TEE);  Surgeon: Larey Dresser, MD;  Location: [facility] ENDOSCOPY;  Service: Cardiovascular;  Laterality: N/A;    Family History: Family History  Problem Relation Age of Onset  . Heart Problems Mother   . Cancer Father     Social History: Social History   Socioeconomic History  . Marital status: Married    Spouse name: scarlett  . Number of children: 3  . Years of education: 70  . Highest education level: Not on file  Occupational History  . Occupation: retired  Tobacco Use  . Smoking status: Former Smoker    Types: Cigarettes    Quit date: 11/02/1999    Years since quitting: 20.2  . Smokeless tobacco: Never Used  Substance and Sexual Activity  . Alcohol use: No  . Drug use: No  . Sexual activity: Yes    Partners: Female  Other Topics Concern  . Not on file  Social History Narrative  . Not on file   Social Determinants of Health   Financial Resource Strain:   . Difficulty of Paying Living Expenses:   Food  Insecurity:   . Worried About Charity fundraiser in the Last Year:   . [employer]  in the Last Year:   Transportation Needs:   . Film/video editor (Medical):   Marland Kitchen Lack of Transportation (Non-Medical):   Physical Activity:   . Days of Exercise per Week:   . Minutes of Exercise per Session:   Stress:   . Feeling of Stress :   Social Connections:   . Frequency of Communication with Friends and Family:   . Frequency of Social Gatherings with Friends and Family:   . Attends Religious Services:   . Active Member of Clubs or Organizations:   . Attends Archivist Meetings:   Marland Kitchen Marital Status:     Allergies:  Allergies  Allergen Reactions  . Plavix [Clopidogrel Bisulfate] Anaphylaxis         Objective:    Vital Signs:   Temp:  [97.4 F (36.3 C)-98 F (36.7 C)] 97.7 F (36.5 C) (04/23 1150) Pulse Rate:  [64-80] 80 (04/23 1150) Resp:  [17-20] 20 (04/23 0459) BP: (100-126)/(64-74) 116/71 (04/23 1150) SpO2:  [90 %-97 %] 91 % (04/23 1150) Last BM Date: 01/08/2020  Weight change: Filed Weights   02/04/2020 0543  Weight: 84.4 kg    Intake/Output:   Intake/Output Summary (Last 24 hours) at 01/20/2020 1348 Last data filed at 01/22/2020 1300 Gross per 24 hour  Intake 1337.7 ml  Output --  Net 1337.7 ml      Physical Exam    General:  Appears weak. Fatigued when standing for weight   HEENT: normal Neck: supple. JVPto jaw  . Carotids 2+ bilat; no bruits. No lymphadenopathy or thyromegaly appreciated. Cor: PMI nondisplaced. Regular rate & rhythm. No rubs, gallops or murmurs. Lungs: Some EW. Decreased in the bases. On 5  liters [place].  Abdomen: soft, nontender, distended. No hepatosplenomegaly. No bruits or masses. Good bowel sounds. Extremities: no cyanosis, clubbing, rash, R and LLE 3+ edema Neuro: alert & orientedx3, cranial nerves grossly intact. moves all 4 extremities w/o difficulty. Affect pleasant   Telemetry   NSR 80-90s   EKG    N/A   Labs    Basic Metabolic Panel: Recent Labs  Lab 01/27/20 1049 01/27/2020 1106  NA 125* 128*  K 3.9 4.3  CL 87* 91*  CO2 30* 25  GLUCOSE 138* 149*  BUN 74* 69*  CREATININE 2.75* 2.85*  CALCIUM 9.3 8.7*    Liver Function Tests: No results for input(s): AST, ALT, ALKPHOS, BILITOT, PROT, ALBUMIN in the last 168 hours. No results for input(s): LIPASE, AMYLASE in the last 168 hours. No results for input(s): AMMONIA in the last 168 hours.  CBC: Recent Labs  Lab 01/27/20 1049  WBC 12.1*  NEUTROABS 9.9*  HGB 12.3*  HCT 36.6*  MCV 88  PLT 185    Cardiac Enzymes: No results for input(s): CKTOTAL, CKMB, CKMBINDEX, TROPONINI in the last 168 hours.  BNP: BNP (last 3 results) Recent Labs    12/19/19 1404 01/15/20 1422 01/28/2020 1106  BNP 192.2* 607.6* 573.5*    ProBNP (last 3 results) No results for input(s): PROBNP in the last 8760 hours.   CBG: No results for input(s): GLUCAP in the last 168 hours.  Coagulation Studies: No results for input(s): LABPROT, INR in the last 72 hours.   Imaging   ECHOCARDIOGRAM LIMITED  Result Date: 01/14/2020    ECHOCARDIOGRAM LIMITED REPORT   Patient Name:   CRU KRITIKOS Date of Exam: 01/15/2020 Medical Rec #:  [id number]     Height:       72.0 in Accession #:  [id number]    Weight:       186.0 lb Date of Birth:  Sep 07, 1941     BSA:          2.066 m Patient Age:    24 years      BP:           100/64 mmHg Patient Gender: M             HR:           72 bpm. Exam Location:  Inpatient Procedure: Limited Echo and Color Doppler Indications:    Post ASD-Closure Q21.1  History:        Patient has prior history of Echocardiogram examinations, most                 recent 01/14/2020. CHF, CAD, Arrythmias:Atrial Fibrillation; Risk                 Factors:Hypertension and Dyslipidemia. Secundum ASD closure on                 02/05/2020 with 11mm Amplatzer.  Sonographer:    Raquel Sarna Senior RDCS Referring Phys: [place]  1. Left ventricular  ejection fraction, by estimation, is 60 to 65%. The left ventricle has normal function. The left ventricle has no regional wall motion abnormalities.  2. Right ventricular systolic function is normal. The right ventricular size is normal.  3. Post ASD closure with 26 mm Amplatzer with no residual flow noted.  4. The mitral valve is normal in structure. Trivial mitral valve regurgitation. No evidence of mitral stenosis.  5. The aortic valve is tricuspid. Aortic valve regurgitation is not visualized. Mild to moderate aortic valve sclerosis/calcification is present, without any evidence of aortic stenosis.  6. The inferior vena cava is dilated in size with >50% respiratory variability, suggesting right atrial pressure of 8 mmHg. FINDINGS  Left Ventricle: Left ventricular ejection fraction, by estimation, is 60 to 65%. The left ventricle has normal function. The left ventricle has no regional wall motion abnormalities. The left ventricular internal cavity size was normal in size. There is  no left ventricular hypertrophy. Right Ventricle: The right ventricular size is normal. No increase in right ventricular wall thickness. Right ventricular systolic function is normal. Left Atrium: Left atrial size was not assessed. Right Atrium: Right atrial size was not assessed. Pericardium: There is no evidence of pericardial effusion. Mitral Valve: The mitral valve is normal in structure. Normal mobility of the mitral valve leaflets. Moderate mitral annular calcification. Trivial mitral valve regurgitation. No evidence of mitral valve stenosis. Tricuspid Valve: The tricuspid valve is normal in structure. Tricuspid valve regurgitation is not demonstrated. No evidence of tricuspid stenosis. Aortic Valve: The aortic valve is tricuspid. Aortic valve regurgitation is not visualized. Mild to moderate aortic valve sclerosis/calcification is present, without any evidence of aortic stenosis. Pulmonic Valve: The pulmonic valve was normal in  structure. Pulmonic valve regurgitation is not visualized. No evidence of pulmonic stenosis. Aorta: The aortic root is normal in size and structure. Venous: The inferior vena cava is dilated in size with greater than 50% respiratory variability, suggesting right atrial pressure of 8 mmHg. IAS/Shunts: No atrial level shunt detected by color flow Doppler. Jenkins Rouge MD Electronically signed by Jenkins Rouge MD Signature Date/Time: 01/20/2020/9:05:38 AM    Final       Medications:     Current Medications: . [START ON 01/31/2020] amiodarone  200 mg Oral Daily  . apixaban  5 mg  Oral BID  . ferrous sulfate  325 mg Oral BID WC  . furosemide  80 mg Intravenous BID  . midodrine  5 mg Oral TID WC  . pantoprazole  40 mg Oral BID  . potassium chloride  40 mEq Oral BID  . pravastatin  40 mg Oral QPM  . sodium chloride flush  3 mL Intravenous Q12H     Infusions:      Assessment/Plan   1. ASD, S/P Closure 4/22 Todays ECHO--> . Post ASD closure with 26 mm Amplatzer with no residual flow noted. Per Dr Burt Knack.   2. A/C Diastolic Heart Failure -TEE 12/26/19 With significant component of RV failure. EF 60-65% with D-shaped septum, mild-moderate RV dilation and normal RV systolic function, IVC dilated. - Marked volume overload. Started IV lasix with minimal urine ouput. Bladder Scan with > 300 cc urine. Place foley.  - Increase IV lasix 120 mg twice a day.   -Given 2.5 mg metolazone now.  - Continue midodrine 5 mg three times a day.  - Wrap R and LLE with ace wraps for mild compression. He is intolerant unna boots.    3. AKI/CKD  -Creatinine baseline 2..4  -Todays creatinine is 2.8 . Follow closely with diuresis. -BMET in am.    4.  Acute Hypoxic Respiratory Failure O2 sats < 85% . Oxygen increased to 5 liters  - CXR now.   5.PAF  -He had to stop Tikosyn due to long QTc on lowest dose Tikosyn. He is now off Xarelto with GI bleeding. -Large ASD thought to be contributing to atrial  fibrillation/flutter. -In NSR today. Continue amiodarone 200 mg daily - On eliquis 5 mg twice a day.   5. CAD  -PCI to RCA, had stent thrombosis back in 2000. -No chest pain.  -No ASA with eliquis.  -Continue pravastatin   6.  Urinary Retention  -Bladder scan with > 300 cc urine.  - Place foley.   7. Anemia  Recent admit in March 2021 hemoglobin dropped to 7.6. Had scope that showed several polyps, diverticulosis and hemrrhoids. EGD 12/2019 showed non bleeding ulcer.  - Check CBC   8. OSA Continue CPAP every night.    Addendum: CXR with bilateral pleural effusions R>L and pulmonary edema.  With worsening renal function will add milrinone to augment diuresis. May need PICC line versus central line.   Length of Stay: 0  Darrick Grinder, NP  01/19/2020, 1:48 PM  Advanced Heart Failure Team Pager [phone] (M-F; 7a - 4p)  Please contact [facility] for night-coverage after hours (4p -7a ) and weekends on [email or web address]   Agree with above.  79 y/o male with chronic diastolic HF, CKD IV and recently discovered ASD whom we are asked to see due to acute respiratory failure. pulmonary edema and progressive renal failure.   Underwent successful percutaneous ASD closure yesterday. Today with progressive dyspnea and respiratory failure with sats in the 80s.   CXR with diffuse pulmonary edema and large right pleural effusion. No response to IV lasix or low-dose milrinone.   On exam General:  Elderly male with increased WOB HEENT: normal Neck: supple. JVP to ear Carotids 2+ bilat; no bruits. No lymphadenopathy or thryomegaly appreciated. Cor: PMI nondisplaced. Regular rate & rhythm. 2/6 SEM LUSB Lungs: crackles. Dull on R Abdomen: soft, nontender, nondistended. No hepatosplenomegaly. No bruits or masses. Good bowel sounds. Extremities: no cyanosis, clubbing, rash, edema Neuro: alert & orientedx3, cranial nerves grossly intact. moves all 4 extremities w/o difficulty. Affect  pleasant  Discussed with Dr. Burt Knack. He has severe left-sided HF with pulmonary edema and respiratory failure in setting of recent ASD closure. Unfortunately situation complicated by A/C renal failure and inability to diurese.   Move to ICU. Increase lasix. Continue mirlinone. Start BIPAP   Will take patient to cath lab for [facility] +/- HD cath.   Discussed possible intubation and need for HD with wife and daughter who are in agreement.   CRITICAL CARE Performed by: Glori Bickers  Total critical care time: 55 minutes  Critical care time was exclusive of separately billable procedures and treating other patients.  Critical care was necessary to treat or prevent imminent or life-threatening deterioration.  Critical care was time spent personally by me (independent of midlevel providers or residents) on the following activities: development of treatment plan with patient and/or surrogate as well as nursing, discussions with consultants, evaluation of patient's response to treatment, examination of patient, obtaining history from patient or surrogate, ordering and performing treatments and interventions, ordering and review of laboratory studies, ordering and review of radiographic studies, pulse oximetry and re-evaluation of patient's condition.  Glori Bickers, MD

## 2020-01-31 ENCOUNTER — Inpatient Hospital Stay (HOSPITAL_COMMUNITY): Payer: PPO

## 2020-01-31 DIAGNOSIS — J9601 Acute respiratory failure with hypoxia: Secondary | ICD-10-CM | POA: Diagnosis not present

## 2020-01-31 DIAGNOSIS — N189 Chronic kidney disease, unspecified: Secondary | ICD-10-CM | POA: Diagnosis not present

## 2020-01-31 DIAGNOSIS — N179 Acute kidney failure, unspecified: Secondary | ICD-10-CM | POA: Diagnosis not present

## 2020-01-31 DIAGNOSIS — I5033 Acute on chronic diastolic (congestive) heart failure: Secondary | ICD-10-CM | POA: Diagnosis not present

## 2020-01-31 LAB — CBC
HCT: 30.2 % — ABNORMAL LOW (ref 39.0–52.0)
HCT: 27.8 % — ABNORMAL LOW (ref 39.0–52.0)
HCT: 27.6 % — ABNORMAL LOW (ref 39.0–52.0)
Hemoglobin: 9.4 g/dL — ABNORMAL LOW (ref 13.0–17.0)
Hemoglobin: 8.7 g/dL — ABNORMAL LOW (ref 13.0–17.0)
Hemoglobin: 8.8 g/dL — ABNORMAL LOW (ref 13.0–17.0)
MCH: 29.9 pg (ref 26.0–34.0)
MCH: 29.6 pg (ref 26.0–34.0)
MCH: 30 pg (ref 26.0–34.0)
MCHC: 31.1 g/dL (ref 30.0–36.0)
MCHC: 31.3 g/dL (ref 30.0–36.0)
MCHC: 31.9 g/dL (ref 30.0–36.0)
MCV: 96.2 fL (ref 80.0–100.0)
MCV: 94.6 fL (ref 80.0–100.0)
MCV: 94.2 fL (ref 80.0–100.0)
Platelets: 132 10*3/uL — ABNORMAL LOW (ref 150–400)
Platelets: 123 10*3/uL — ABNORMAL LOW (ref 150–400)
Platelets: 126 10*3/uL — ABNORMAL LOW (ref 150–400)
RBC: 3.14 MIL/uL — ABNORMAL LOW (ref 4.22–5.81)
RBC: 2.94 MIL/uL — ABNORMAL LOW (ref 4.22–5.81)
RBC: 2.93 MIL/uL — ABNORMAL LOW (ref 4.22–5.81)
RDW: 22.1 % — ABNORMAL HIGH (ref 11.5–15.5)
RDW: 22.1 % — ABNORMAL HIGH (ref 11.5–15.5)
RDW: 21.9 % — ABNORMAL HIGH (ref 11.5–15.5)
WBC: 18.3 10*3/uL — ABNORMAL HIGH (ref 4.0–10.5)
WBC: 16.8 10*3/uL — ABNORMAL HIGH (ref 4.0–10.5)
WBC: 15.2 10*3/uL — ABNORMAL HIGH (ref 4.0–10.5)
nRBC: 0 % (ref 0.0–0.2)
nRBC: 0 % (ref 0.0–0.2)
nRBC: 0 % (ref 0.0–0.2)

## 2020-01-31 LAB — MRSA PCR SCREENING: MRSA by PCR: NEGATIVE

## 2020-01-31 LAB — BASIC METABOLIC PANEL
Anion gap: 12 (ref 5–15)
BUN: 75 mg/dL — ABNORMAL HIGH (ref 8–23)
CO2: 26 mmol/L (ref 22–32)
Calcium: 8.5 mg/dL — ABNORMAL LOW (ref 8.9–10.3)
Chloride: 93 mmol/L — ABNORMAL LOW (ref 98–111)
Creatinine, Ser: 2.96 mg/dL — ABNORMAL HIGH (ref 0.61–1.24)
GFR calc Af Amer: 23 mL/min — ABNORMAL LOW (ref >60)
GFR calc non Af Amer: 19 mL/min — ABNORMAL LOW (ref >60)
Glucose, Bld: 125 mg/dL — ABNORMAL HIGH (ref 70–99)
Potassium: 4.8 mmol/L (ref 3.5–5.1)
Sodium: 131 mmol/L — ABNORMAL LOW (ref 135–145)

## 2020-01-31 LAB — COOXEMETRY PANEL
Carboxyhemoglobin: 2.3 % — ABNORMAL HIGH (ref 0.5–1.5)
Methemoglobin: 1.3 % (ref 0.0–1.5)
O2 Saturation: 71.2 %
Total hemoglobin: 10.3 g/dL — ABNORMAL LOW (ref 12.0–16.0)

## 2020-01-31 LAB — TYPE AND SCREEN
ABO/RH(D): A POS
Antibody Screen: NEGATIVE

## 2020-01-31 MED ORDER — "THROMBI-PAD 3""X3"" EX PADS"
1.0000 | MEDICATED_PAD | Freq: Once | CUTANEOUS | Status: AC
  Administered 2020-02-01: 1 via TOPICAL
  Filled 2020-01-31: qty 1

## 2020-01-31 MED ORDER — SODIUM CHLORIDE 0.9% FLUSH
10.0000 mL | Freq: Two times a day (BID) | INTRAVENOUS | Status: DC
  Administered 2020-01-31 – 2020-02-03 (×4): 10 mL
  Administered 2020-02-04: 10:00:00 30 mL
  Administered 2020-02-05: 20 mL
  Administered 2020-02-06 (×2): 10 mL
  Administered 2020-02-07: 20 mL
  Administered 2020-02-07 – 2020-02-08 (×2): 10 mL

## 2020-01-31 MED ORDER — "THROMBI-PAD 3""X3"" EX PADS"
1.0000 | MEDICATED_PAD | Freq: Once | CUTANEOUS | Status: AC
  Administered 2020-01-31: 1 via TOPICAL
  Filled 2020-01-31: qty 1

## 2020-01-31 MED ORDER — SODIUM CHLORIDE 0.9% FLUSH: 10.0000 mL | INTRAVENOUS | Status: DC | PRN

## 2020-01-31 MED ORDER — MIDODRINE HCL 5 MG PO TABS
10.0000 mg | ORAL_TABLET | Freq: Three times a day (TID) | ORAL | Status: DC
  Administered 2020-01-31 – 2020-02-01 (×4): 10 mg via ORAL
  Filled 2020-01-31 (×3): qty 2

## 2020-01-31 MED ORDER — NOREPINEPHRINE 4 MG/250ML-% IV SOLN
0.0000 ug/min | INTRAVENOUS | Status: DC
  Administered 2020-01-31: 2 ug/min via INTRAVENOUS
  Administered 2020-02-01: 21:00:00 12 ug/min via INTRAVENOUS
  Administered 2020-02-01: 6 ug/min via INTRAVENOUS
  Administered 2020-02-02: 13 ug/min via INTRAVENOUS
  Administered 2020-02-02: 17 ug/min via INTRAVENOUS
  Administered 2020-02-02: 14 ug/min via INTRAVENOUS
  Administered 2020-02-02 (×2): 16 ug/min via INTRAVENOUS
  Administered 2020-02-03 (×2): 18 ug/min via INTRAVENOUS
  Administered 2020-02-03: 06:00:00 16 ug/min via INTRAVENOUS
  Filled 2020-01-31 (×13): qty 250

## 2020-01-31 MED ORDER — ORAL CARE MOUTH RINSE
15.0000 mL | Freq: Two times a day (BID) | OROMUCOSAL | Status: DC
  Administered 2020-01-31 – 2020-02-06 (×12): 15 mL via OROMUCOSAL

## 2020-01-31 NOTE — Plan of Care (Signed)
  Problem: Education: ?Goal: Knowledge of General Education information will improve ?Description: Including pain rating scale, medication(s)/side effects and non-pharmacologic comfort measures ?Outcome: Progressing ?  ?Problem: Health Behavior/Discharge Planning: ?Goal: Ability to manage health-related needs will improve ?Outcome: Progressing ?  ?Problem: Clinical Measurements: ?Goal: Ability to maintain clinical measurements within normal limits will improve ?Outcome: Progressing ?Goal: Will remain free from infection ?Outcome: Progressing ?Goal: Diagnostic test results will improve ?Outcome: Progressing ?Goal: Respiratory complications will improve ?Outcome: Progressing ?Goal: Cardiovascular complication will be avoided ?Outcome: Progressing ?  ?Problem: Coping: ?Goal: Level of anxiety will decrease ?Outcome: Progressing ?  ?Problem: Elimination: ?Goal: Will not experience complications related to bowel motility ?Outcome: Progressing ?  ?Problem: Pain Managment: ?Goal: General experience of comfort will improve ?Outcome: Progressing ?  ?Problem: Safety: ?Goal: Ability to remain free from injury will improve ?Outcome: Progressing ?  ?

## 2020-01-31 NOTE — Progress Notes (Addendum)
Dr Blossom Hoops, cards fellow at bedside assessing swan, per MD does not appear to be wedged, pulled back to 56.5 cm, waveform remains dampened. MD also assessed L IJ trialysis catheter bleeding. Will order more thrombin, hold pressure and MD will return to reassess and possibly place stitch at insertion.

## 2020-01-31 NOTE — Progress Notes (Signed)
I went in to talk to patient about what time he normally goes on CPAP at night and pt stated "he's not sure he needs it tonight with his oxygen". He states "he is breathing well and feels good and does not want to mess the good thing he's got going". I asked him how he wore it at home and he stated some nights he skips, some nights he wears it and then takes it off about 3 or 4 in the morning. I explained to pt if he changes his mind and would like to wear the CPAP to please let us know. Pt in agreement. RN was made aware.

## 2020-01-31 NOTE — Progress Notes (Addendum)
Patient's HD cath site was oozing blood, so RN did a sterile CL change at 0730. Site continued to bleed, ordered a Thrombi pad and redid sterile dressing at 1500. Site appears clean and bleeding has stopped.Patients platelets are 123.

## 2020-01-31 NOTE — Progress Notes (Signed)
Advanced Heart Failure Rounding Note   Subjective:    Developed acute pulmonary edema with respiratory failure and worsening renal function yesterday.  Underwent placement of a Swan-Ganz catheter and temporary dialysis catheter.  Now on milrinone and Lasix drip at 25.  Urine output has picked up.  There is no weight on the chart today.  He had about a liter of urine output overnight and close to a liter this morning.  His breathing is better.  He is off BiPAP.  He is on nasal cannula with saturations in the mid 90s.  Denies orthopnea or PND.  Remains weak.  Creatinine down from 3.2-> 2.9.  Swan RA 11 PA 30/22 (25) PCWP 25 Thermo 7.7/3.7   Objective:   Weight Range:  Vital Signs:   Temp:  [97.7 F (36.5 C)-99.5 F (37.5 C)] 98.8 F (37.1 C) (04/24 1100) Pulse Rate:  [64-275] 76 (04/24 1100) Resp:  [14-30] 18 (04/24 1100) BP: (82-130)/(46-78) 88/46 (04/24 1100) SpO2:  [89 %-100 %] 96 % (04/24 1100) FiO2 (%):  [40 %] 40 % (04/23 2130) Weight:  [86.7 kg-87.7 kg] (P) 87.7 kg (04/24 0300) Last BM Date: 01/16/2020  Weight change: Filed Weights   01/29/2020 1523 01/10/2020 2116 01/31/20 0300  Weight: 86.7 kg 87.7 kg (P) 87.7 kg    Intake/Output:   Intake/Output Summary (Last 24 hours) at 01/31/2020 1137 Last data filed at 01/31/2020 1000 Gross per 24 hour  Intake 807.35 ml  Output 1945 ml  Net -1137.65 ml     Physical Exam: General:  Elderly male. Sitting up in bed No resp difficulty HEENT: normal Neck: supple. RIJ swan. LIJ trialysis Carotids 2+ bilat; no bruits. No lymphadenopathy or thryomegaly appreciated. Cor: PMI nondisplaced. Regular rate & rhythm. No rubs, gallops or murmurs. Lungs: + crackles. Dull at right base Abdomen: soft, nontender, nondistended. No hepatosplenomegaly. No bruits or masses. Good bowel sounds. Extremities: no cyanosis, clubbing, rash, edema Neuro: alert & orientedx3, cranial nerves grossly intact. moves all 4 extremities w/o difficulty.  Affect pleasant  Telemetry: sinus 80-90s Personally reviewed   Labs: Basic Metabolic Panel: Recent Labs  Lab 01/27/20 1049 02/04/2020 1106 01/18/2020 2000 01/20/2020 2009 01/31/20 0538  NA 125* 128* 129*  132* 129* 131*  K 3.9 4.3 5.0  4.7 4.8 4.8  CL 87* 91*  --  90* 93*  CO2 30* 25  --   --  26  GLUCOSE 138* 149*  --  105* 125*  BUN 74* 69*  --  62* 75*  CREATININE 2.75* 2.85*  --  3.20* 2.96*  CALCIUM 9.3 8.7*  --   --  8.5*    Liver Function Tests: No results for input(s): AST, ALT, ALKPHOS, BILITOT, PROT, ALBUMIN in the last 168 hours. No results for input(s): LIPASE, AMYLASE in the last 168 hours. No results for input(s): AMMONIA in the last 168 hours.  CBC: Recent Labs  Lab 01/27/20 1049 01/28/2020 1704 01/31/2020 2000 01/11/2020 2009 01/31/20 0538  WBC 12.1* 22.7*  --   --  18.3*  NEUTROABS 9.9*  --   --   --   --   HGB 12.3* 12.3* 12.9*  12.6* 12.6* 9.4*  HCT 36.6* 38.4* 38.0*  37.0* 37.0* 30.2*  MCV 88 95.0  --   --  96.2  PLT 185 163  --   --  132*    Cardiac Enzymes: No results for input(s): CKTOTAL, CKMB, CKMBINDEX, TROPONINI in the last 168 hours.  BNP: BNP (last 3 results) Recent  Labs    12/19/19 1404 01/15/20 1422 02/06/2020 1106  BNP 192.2* 607.6* 573.5*    ProBNP (last 3 results) No results for input(s): PROBNP in the last 8760 hours.    Other results:  Imaging: CARDIAC CATHETERIZATION  Result Date: 02/05/2020 Findings: RA = 22 RV = 57/18 PA = 60/22 (38) PCW = 25 (v = 32) Fick cardiac output/index = 10.4/7.3 Thermo CO/CI = 7.3/3.65 PVR = 2.0 WU (thermo) Ao sat = 93% PA sat = 76%, 77% Assessment: 1. Markedly elevated biventricular pressures with normal cardiac output Plan/Discussion: Attempt IV diuresis. If no response with need CVVHD for volume removal. Larry Bickers, MD 9:42 PM  DG CHEST PORT 1 VIEW  Result Date: 01/16/2020 CLINICAL DATA:  Status post atrial septal defect closure. EXAM: PORTABLE CHEST 1 VIEW COMPARISON:  Radiograph  12/19/2019 FINDINGS: Moderate cardiomegaly. Atrial septic defect closure device is seen. Aortic atherosclerosis. Moderate right pleural effusion, increased from prior exam. Small to moderate left pleural effusion. Bilateral central lung opacities, perihilar predominant, suspicious for pulmonary edema, but nonspecific. No pneumothorax. The bones are under mineralized. IMPRESSION: 1. Moderate cardiomegaly.  Atrial septal closure device is seen. 2. Moderate right pleural effusion, increased from prior exam. Small to moderate left pleural effusion. 3. Prominent bilateral central lung opacities, suspicious for pulmonary edema, but nonspecific. Pneumonia or aspiration also considered. Electronically Signed   By: Keith Rake M.D.   On: 02/06/2020 16:08   ECHOCARDIOGRAM LIMITED  Result Date: 02/01/2020    ECHOCARDIOGRAM LIMITED REPORT   Patient Name:   Larry [name] Date of Exam: 01/17/2020 Medical Rec #:  [id number]     Height:       72.0 in Accession #:    [id number]    Weight:       186.0 lb Date of Birth:  04-08-1941     BSA:          2.066 m Patient Age:    23 years      BP:           100/64 mmHg Patient Gender: M             HR:           72 bpm. Exam Location:  Inpatient Procedure: Limited Echo and Color Doppler Indications:    Post ASD-Closure Q21.1  History:        Patient has prior history of Echocardiogram examinations, most                 recent 01/14/2020. CHF, CAD, Arrythmias:Atrial Fibrillation; Risk                 Factors:Hypertension and Dyslipidemia. Secundum ASD closure on                 01/08/2020 with 47mm Amplatzer.  Sonographer:    Raquel Sarna Senior RDCS Referring Phys: [place]  1. Left ventricular ejection fraction, by estimation, is 60 to 65%. The left ventricle has normal function. The left ventricle has no regional wall motion abnormalities.  2. Right ventricular systolic function is normal. The right ventricular size is normal.  3. Post ASD closure with 26 mm Amplatzer  with no residual flow noted.  4. The mitral valve is normal in structure. Trivial mitral valve regurgitation. No evidence of mitral stenosis.  5. The aortic valve is tricuspid. Aortic valve regurgitation is not visualized. Mild to moderate aortic valve sclerosis/calcification is present, without any evidence of aortic stenosis.  6. The inferior vena  cava is dilated in size with >50% respiratory variability, suggesting right atrial pressure of 8 mmHg. FINDINGS  Left Ventricle: Left ventricular ejection fraction, by estimation, is 60 to 65%. The left ventricle has normal function. The left ventricle has no regional wall motion abnormalities. The left ventricular internal cavity size was normal in size. There is  no left ventricular hypertrophy. Right Ventricle: The right ventricular size is normal. No increase in right ventricular wall thickness. Right ventricular systolic function is normal. Left Atrium: Left atrial size was not assessed. Right Atrium: Right atrial size was not assessed. Pericardium: There is no evidence of pericardial effusion. Mitral Valve: The mitral valve is normal in structure. Normal mobility of the mitral valve leaflets. Moderate mitral annular calcification. Trivial mitral valve regurgitation. No evidence of mitral valve stenosis. Tricuspid Valve: The tricuspid valve is normal in structure. Tricuspid valve regurgitation is not demonstrated. No evidence of tricuspid stenosis. Aortic Valve: The aortic valve is tricuspid. Aortic valve regurgitation is not visualized. Mild to moderate aortic valve sclerosis/calcification is present, without any evidence of aortic stenosis. Pulmonic Valve: The pulmonic valve was normal in structure. Pulmonic valve regurgitation is not visualized. No evidence of pulmonic stenosis. Aorta: The aortic root is normal in size and structure. Venous: The inferior vena cava is dilated in size with greater than 50% respiratory variability, suggesting right atrial pressure  of 8 mmHg. IAS/Shunts: No atrial level shunt detected by color flow Doppler. Jenkins Rouge MD Electronically signed by Jenkins Rouge MD Signature Date/Time: 01/12/2020/9:05:38 AM    Final       Medications:     Scheduled Medications: . amiodarone  200 mg Oral Daily  . apixaban  5 mg Oral BID  . Chlorhexidine Gluconate Cloth  6 each Topical Daily  . ferrous sulfate  325 mg Oral BID WC  . mouth rinse  15 mL Mouth Rinse BID  . metolazone  2.5 mg Oral Daily  . midodrine  5 mg Oral TID WC  . pantoprazole  40 mg Oral BID  . potassium chloride  40 mEq Oral BID  . pravastatin  40 mg Oral QPM  . sodium chloride flush  10-40 mL Intracatheter Q12H  . sodium chloride flush  3 mL Intravenous Q12H     Infusions: . furosemide 120 mg (01/31/20 1021)  . furosemide (LASIX) infusion 25 mg/hr (01/31/20 0800)  . milrinone 0.125 mcg/kg/min (01/31/20 0900)     PRN Medications:  acetaminophen, nitroGLYCERIN, ondansetron (ZOFRAN) IV, oxyCODONE, polyethylene glycol, sodium chloride flush   Assessment.Plan:   1. Acute hypoxic respiratory failure - due to pulmonary edema s/p ASD closure.  - improving. Swan numbers much improved. - continue milrinone and high-dose IV lasix + metolazone - has significant R effusion that may need thora - repeat CXR  2. Large secundum ASD s/p closure:  - s/p successful transcatheter closure of a large secundum ASD using a 26 mm Amplatzer septal occluder deviceon 01/28/2020.  - Post op limited echo showed EF 60-65% with well seated ASD closure device with no residual flow. - Back on Eliquis.   3. Acute on chronic diastolic CHF:  - management as above  4. Acute on CKD stage IV:  - creat baseline 2.1- 2.5.  - peaked at 3.2 likely due to cardiorenal and renal vein congestion - back down to 2.9 today - continue milrinone and IV diuresis - trailysis cath in place if needed  5. Paroxysmal aifb/flutter:  - maintaining sinus by tele.  - continue amio and Eliquis  -  No longer on metoprolol due to hypotension. Now on midodrine  6. CAD:  - s/p remotePCI to RCA, had stent thrombosis back in 2000.No chest pain. Continue medical therapy.  - No aspirin given need for Eliquis and recent GI bleed  7. Recent GI bleed with chronic anemia:  - hgb 12.6 -> 9.4 this am - no obvious site of bleeding - may need to hold Eliquis - repeat CBC now. Keep active T&S  8. Leukocytosis - Tmax 99.5 - No obvious source infection. Follow.   CRITICAL CARE Performed by: Larry [name]  Total critical care time: 40 minutes  Critical care time was exclusive of separately billable procedures and treating other patients.  Critical care was necessary to treat or prevent imminent or life-threatening deterioration.  Critical care was time spent personally by me (independent of midlevel providers or residents) on the following activities: development of treatment plan with patient and/or surrogate as well as nursing, discussions with consultants, evaluation of patient's response to treatment, examination of patient, obtaining history from patient or surrogate, ordering and performing treatments and interventions, ordering and review of laboratory studies, ordering and review of radiographic studies, pulse oximetry and re-evaluation of patient's condition.      Length of Stay: 1   Larry Bickers MD 01/31/2020, 11:37 AM  Advanced Heart Failure Team Pager [phone] (M-F; [place])  Please contact [facility] for night-coverage after hours (4p -7a ) and weekends on [email or web address]

## 2020-01-31 NOTE — Progress Notes (Signed)
Patient's HD cath site bleeding after movement, dressing reinforced and more thrombi pad added.

## 2020-01-31 NOTE — Progress Notes (Signed)
Pt taken off bipap and placed on 5L [place].  Pt is tolerating well.  Sats 96%.  BBS are clear.  No increased WOB or SOB noted.  RT will continue to monitor.

## 2020-02-01 ENCOUNTER — Inpatient Hospital Stay (HOSPITAL_COMMUNITY): Payer: PPO

## 2020-02-01 DIAGNOSIS — J9601 Acute respiratory failure with hypoxia: Secondary | ICD-10-CM

## 2020-02-01 DIAGNOSIS — N179 Acute kidney failure, unspecified: Secondary | ICD-10-CM | POA: Diagnosis not present

## 2020-02-01 DIAGNOSIS — N189 Chronic kidney disease, unspecified: Secondary | ICD-10-CM | POA: Diagnosis not present

## 2020-02-01 DIAGNOSIS — I5033 Acute on chronic diastolic (congestive) heart failure: Secondary | ICD-10-CM | POA: Diagnosis not present

## 2020-02-01 DIAGNOSIS — J90 Pleural effusion, not elsewhere classified: Secondary | ICD-10-CM

## 2020-02-01 DIAGNOSIS — Z8774 Personal history of (corrected) congenital malformations of heart and circulatory system: Secondary | ICD-10-CM

## 2020-02-01 LAB — LACTATE DEHYDROGENASE: LDH: 380 U/L — ABNORMAL HIGH (ref 98–192)

## 2020-02-01 LAB — GLUCOSE, PLEURAL OR PERITONEAL FLUID: Glucose, Fluid: 129 mg/dL

## 2020-02-01 LAB — BASIC METABOLIC PANEL
Anion gap: 14 (ref 5–15)
BUN: 83 mg/dL — ABNORMAL HIGH (ref 8–23)
CO2: 29 mmol/L (ref 22–32)
Calcium: 8.4 mg/dL — ABNORMAL LOW (ref 8.9–10.3)
Chloride: 89 mmol/L — ABNORMAL LOW (ref 98–111)
Creatinine, Ser: 3.04 mg/dL — ABNORMAL HIGH (ref 0.61–1.24)
GFR calc Af Amer: 22 mL/min — ABNORMAL LOW (ref >60)
GFR calc non Af Amer: 19 mL/min — ABNORMAL LOW (ref >60)
Glucose, Bld: 121 mg/dL — ABNORMAL HIGH (ref 70–99)
Potassium: 3.9 mmol/L (ref 3.5–5.1)
Sodium: 132 mmol/L — ABNORMAL LOW (ref 135–145)

## 2020-02-01 LAB — CBC
HCT: 26.7 % — ABNORMAL LOW (ref 39.0–52.0)
Hemoglobin: 8.6 g/dL — ABNORMAL LOW (ref 13.0–17.0)
MCH: 30.3 pg (ref 26.0–34.0)
MCHC: 32.2 g/dL (ref 30.0–36.0)
MCV: 94 fL (ref 80.0–100.0)
Platelets: 120 10*3/uL — ABNORMAL LOW (ref 150–400)
RBC: 2.84 MIL/uL — ABNORMAL LOW (ref 4.22–5.81)
RDW: 21.6 % — ABNORMAL HIGH (ref 11.5–15.5)
WBC: 13.7 10*3/uL — ABNORMAL HIGH (ref 4.0–10.5)
nRBC: 0 % (ref 0.0–0.2)

## 2020-02-01 LAB — LACTATE DEHYDROGENASE, PLEURAL OR PERITONEAL FLUID: LD, Fluid: 116 U/L — ABNORMAL HIGH (ref 3–23)

## 2020-02-01 LAB — COOXEMETRY PANEL
Carboxyhemoglobin: 2.5 % — ABNORMAL HIGH (ref 0.5–1.5)
Methemoglobin: 1.4 % (ref 0.0–1.5)
O2 Saturation: 73 %
Total hemoglobin: 8.8 g/dL — ABNORMAL LOW (ref 12.0–16.0)

## 2020-02-01 LAB — PROTEIN, PLEURAL OR PERITONEAL FLUID: Total protein, fluid: <3 g/dL

## 2020-02-01 LAB — BODY FLUID CELL COUNT WITH DIFFERENTIAL
Lymphs, Fluid: 42 %
Monocyte-Macrophage-Serous Fluid: 29 % — ABNORMAL LOW (ref 50–90)
Neutrophil Count, Fluid: 29 % — ABNORMAL HIGH (ref 0–25)
Total Nucleated Cell Count, Fluid: 94 cu mm (ref 0–1000)

## 2020-02-01 LAB — SEDIMENTATION RATE: Sed Rate: 35 mm/hr — ABNORMAL HIGH (ref 0–16)

## 2020-02-01 MED ORDER — AMIODARONE HCL IN DEXTROSE 360-4.14 MG/200ML-% IV SOLN
60.0000 mg/h | INTRAVENOUS | Status: AC
  Administered 2020-02-01: 60 mg/h via INTRAVENOUS
  Filled 2020-02-01: qty 200

## 2020-02-01 MED ORDER — AMIODARONE HCL IN DEXTROSE 360-4.14 MG/200ML-% IV SOLN
30.0000 mg/h | INTRAVENOUS | Status: DC
  Administered 2020-02-01 – 2020-02-08 (×12): 30 mg/h via INTRAVENOUS
  Filled 2020-02-01 (×15): qty 200

## 2020-02-01 MED ORDER — MIDODRINE HCL 5 MG PO TABS
15.0000 mg | ORAL_TABLET | Freq: Three times a day (TID) | ORAL | Status: DC
  Administered 2020-02-01 – 2020-02-07 (×17): 15 mg via ORAL
  Filled 2020-02-01 (×18): qty 3

## 2020-02-01 NOTE — Progress Notes (Signed)
  Dr. Carlis Abbott requested that patient sleep with bipap tonight. He is allowed to trial a nasal cannula after 2000 as long as sats stay at or above 90.

## 2020-02-01 NOTE — Procedures (Signed)
Thoracentesis Procedure Note  Pre-operative Diagnosis: pleural effusion  Post-operative Diagnosis: normal  Indications: acute respiratory failure, pleural effusion  Procedure Details  Consent: Informed consent was obtained. Risks of the procedure were discussed including: infection, bleeding, pain, pneumothorax.  [place] was used to locate fluid collection and skin site was marked.  Under sterile conditions the patient was positioned. Betadine solution and sterile drapes were utilized.  1% plain lidocaine was used to anesthetize the skin and [place] tissue. Fluid was obtained without any difficulties and minimal blood loss.  A dressing was applied to the wound and wound care instructions were provided.   Findings 1700 ml of amber pleural fluid was obtained. A sample was sent to Pathology for cytogenetics, flow, and cell counts, as well as for infection analysis.  Complications:  None; patient tolerated the procedure well.    Post-procedure US demonstrated smaller residual pleural effusion, sliding lung superiorly.         Condition: stable  Plan A follow up chest x-ray was ordered. Bed Rest for 0 hours. Tylenol PRN for pain.  Attending Attestation: I performed the procedure.   Julian Hy, DO 02/01/20 5:42 PM [facility]

## 2020-02-01 NOTE — Progress Notes (Signed)
Approx [id number]  L IJ HD catheter site bleeding again. Rolled pt to change linens, became short of breath, encouraged breathing through nose, n/c to 6 Lpm, brief improvement. Sterile dressing changed done, pressure held x 15 min, Thrombi pad placed under new dressing. Sats remain borderline but pt denies sob, RT paged and coming to place pt back on BiPAP.

## 2020-02-01 NOTE — Progress Notes (Signed)
0030-0100 pressure held on L IJ HD catheter site, hemostasis achieved, gauze dressing applied using sterile technique.   0200 No bleeding noted on dressing, paged Dr Blossom Hoops to update, no need at this time for stitch to be placed.

## 2020-02-01 NOTE — Plan of Care (Signed)

## 2020-02-01 NOTE — Progress Notes (Signed)
Advanced Heart Failure Rounding Note   Subjective:    Developed acute pulmonary edema with respiratory failure and worsening renal function yesterday.  Underwent placement of a Swan-Ganz catheter and temporary dialysis catheter.  Remains on milrinone 0.125. Lasix gtt dropped from 25 -> 15 yesterday for low BP. NE added. Now on NE 6.   Excellent diuresis yesterday with 4.2L out. Weight down 5 pounds. Back on Bipap. CXR still with diffuse bilateral infiltrates and moderate right effusion that are unchanged (Personally reviewed)  Went back into AF overnight. Rates 100-115  Creatinine 3.2-> 2.9 -> 3.0   Eliquis stopped 4/24 due to falling hgb. HGb now stabilized 12.6 -> 9.4 -> 8.8 -> 8.6. Oozing from Trialysis site has stopped.     Swan #s done personally CVP 15 PA 36/26 (31) PCWP 19 Thermo CO/CI 7.6/3.7 PAPi 0.67  Objective:   Weight Range:  Vital Signs:   Temp:  [97.5 F (36.4 C)-98.8 F (37.1 C)] 98.2 F (36.8 C) (04/25 1300) Pulse Rate:  [82-122] 112 (04/25 1300) Resp:  [12-24] 12 (04/25 1300) BP: (76-104)/(47-69) 99/60 (04/25 1300) SpO2:  [80 %-98 %] 95 % (04/25 1300) FiO2 (%):  [40 %] 40 % (04/25 0539) Weight:  [85.4 kg] 85.4 kg (04/25 0630) Last BM Date: 02/01/2020  Weight change: Filed Weights   01/08/2020 2116 01/31/20 0300 02/01/20 0630  Weight: 87.7 kg 87.7 kg 85.4 kg    Intake/Output:   Intake/Output Summary (Last 24 hours) at 02/01/2020 1339 Last data filed at 02/01/2020 1200 Gross per 24 hour  Intake 806.87 ml  Output 3835 ml  Net -3028.13 ml     Physical Exam: General:  Elderly male. Sitting up in bed on bipap HEENT: normal Neck: supple. RIJ swan. LIJ trialysis with small ecchymosis Carotids 2+ bilat; no bruits. No lymphadenopathy or thryomegaly appreciated. Cor: PMI nondisplaced. irregular rate & rhythm. 2/6 TR Lungs: clear decreased R base Abdomen: soft, nontender, nondistended. No hepatosplenomegaly. No bruits or masses. Good bowel  sounds. Extremities: no cyanosis, clubbing, rash, edema Neuro: alert & orientedx3, cranial nerves grossly intact. moves all 4 extremities w/o difficulty. Affect pleasant   Telemetry: AF 100-115 Personally reviewed   Labs: Basic Metabolic Panel: Recent Labs  Lab 01/27/20 1049 01/27/20 1049 02/02/2020 1106 01/27/2020 2000 01/27/2020 2009 01/31/20 0538 02/01/20 0232  NA 125*  --  128* 129*  132* 129* 131* 132*  K 3.9   < > 4.3 5.0  4.7 4.8 4.8 3.9  CL 87*  --  91*  --  90* 93* 89*  CO2 30*  --  25  --   --  26 29  GLUCOSE 138*  --  149*  --  105* 125* 121*  BUN 74*  --  69*  --  62* 75* 83*  CREATININE 2.75*  --  2.85*  --  3.20* 2.96* 3.04*  CALCIUM 9.3   < > 8.7*  --   --  8.5* 8.4*   < > = values in this interval not displayed.    Liver Function Tests: No results for input(s): AST, ALT, ALKPHOS, BILITOT, PROT, ALBUMIN in the last 168 hours. No results for input(s): LIPASE, AMYLASE in the last 168 hours. No results for input(s): AMMONIA in the last 168 hours.  CBC: Recent Labs  Lab 01/27/20 1049 02/05/2020 1704 02/02/2020 2000 01/17/2020 2009 01/31/20 0538 01/31/20 1249 01/31/20 1726 02/01/20 0232  WBC 12.1* 22.7*  --   --  18.3* 16.8* 15.2* 13.7*  NEUTROABS 9.9*  --   --   --   --   --   --   --  HGB 12.3* 12.3*   < > 12.6* 9.4* 8.7* 8.8* 8.6*  HCT 36.6* 38.4*   < > 37.0* 30.2* 27.8* 27.6* 26.7*  MCV 88 95.0  --   --  96.2 94.6 94.2 94.0  PLT 185 163  --   --  132* 123* 126* 120*   < > = values in this interval not displayed.    Cardiac Enzymes: No results for input(s): CKTOTAL, CKMB, CKMBINDEX, TROPONINI in the last 168 hours.  BNP: BNP (last 3 results) Recent Labs    12/19/19 1404 01/15/20 1422 02/05/2020 1106  BNP 192.2* 607.6* 573.5*    ProBNP (last 3 results) No results for input(s): PROBNP in the last 8760 hours.    Other results:  Imaging: CARDIAC CATHETERIZATION  Result Date: 01/14/2020 Findings: RA = 22 RV = 57/18 PA = 60/22 (38) PCW = 25 (v  = 32) Fick cardiac output/index = 10.4/7.3 Thermo CO/CI = 7.3/3.65 PVR = 2.0 WU (thermo) Ao sat = 93% PA sat = 76%, 77% Assessment: 1. Markedly elevated biventricular pressures with normal cardiac output Plan/Discussion: Attempt IV diuresis. If no response with need CVVHD for volume removal. Glori Bickers, MD 9:42 PM  DG CHEST PORT 1 VIEW  Result Date: 02/01/2020 CLINICAL DATA:  Right heart catheterization. EXAM: PORTABLE CHEST 1 VIEW COMPARISON:  January 31, 2020 FINDINGS: Stable PA catheter and left central line. No pneumothorax. Diffuse bilateral pulmonary opacities are similar in the interval. The moderate right-sided pleural effusion is stable. The cardiomediastinal silhouette is stable. No other acute interval changes. IMPRESSION: 1. Support apparatus as above. 2. Diffuse bilateral pulmonary opacities remain, stable in the interval. The right-sided pleural effusion, moderate size, is also stable. Electronically Signed   By: Dorise Bullion III M.D   On: 02/01/2020 12:55   DG CHEST PORT 1 VIEW  Result Date: 01/31/2020 CLINICAL DATA:  Respiratory failure, hypoxia EXAM: PORTABLE CHEST 1 VIEW COMPARISON:  02/02/2020 FINDINGS: Interval placement of left neck vascular catheter, tip over the superior SVC. Interval placement of right neck pulmonary arterial catheter, tip positioned over the right pulmonary artery. No significant change in extensive, diffuse bilateral interstitial and heterogeneous airspace opacity with a moderate right pleural effusion. Cardiomegaly. IMPRESSION: 1. Interval placement of left neck vascular catheter, tip over the superior SVC. Interval placement of right neck pulmonary arterial catheter, tip positioned over the right pulmonary artery. 2. No significant change in extensive, diffuse bilateral interstitial and heterogeneous airspace opacity with a moderate right pleural effusion. 3.  Cardiomegaly. Electronically Signed   By: Eddie Candle M.D.   On: 01/31/2020 12:25   DG CHEST  PORT 1 VIEW  Result Date: 01/21/2020 CLINICAL DATA:  Status post atrial septal defect closure. EXAM: PORTABLE CHEST 1 VIEW COMPARISON:  Radiograph 12/19/2019 FINDINGS: Moderate cardiomegaly. Atrial septic defect closure device is seen. Aortic atherosclerosis. Moderate right pleural effusion, increased from prior exam. Small to moderate left pleural effusion. Bilateral central lung opacities, perihilar predominant, suspicious for pulmonary edema, but nonspecific. No pneumothorax. The bones are under mineralized. IMPRESSION: 1. Moderate cardiomegaly.  Atrial septal closure device is seen. 2. Moderate right pleural effusion, increased from prior exam. Small to moderate left pleural effusion. 3. Prominent bilateral central lung opacities, suspicious for pulmonary edema, but nonspecific. Pneumonia or aspiration also considered. Electronically Signed   By: Keith Rake M.D.   On: 01/08/2020 16:08     Medications:     Scheduled Medications: . amiodarone  200 mg Oral Daily  . Chlorhexidine Gluconate Cloth  6 each  Topical Daily  . ferrous sulfate  325 mg Oral BID WC  . mouth rinse  15 mL Mouth Rinse BID  . metolazone  2.5 mg Oral Daily  . midodrine  10 mg Oral TID WC  . pantoprazole  40 mg Oral BID  . potassium chloride  40 mEq Oral BID  . pravastatin  40 mg Oral QPM  . sodium chloride flush  10-40 mL Intracatheter Q12H  . sodium chloride flush  3 mL Intravenous Q12H    Infusions: . furosemide (LASIX) infusion 10 mg/hr (02/01/20 1200)  . milrinone 0.125 mcg/kg/min (02/01/20 1200)  . norepinephrine (LEVOPHED) Adult infusion 6 mcg/min (02/01/20 1305)    PRN Medications: acetaminophen, nitroGLYCERIN, ondansetron (ZOFRAN) IV, oxyCODONE, polyethylene glycol, sodium chloride flush   Assessment.Plan:   1. Acute hypoxic respiratory failure with acute pulmonary edema and R pleural effusion - due to pulmonary edema s/p ASD closure.  - good diuresis but remains on BIPAP. CXR with persistent  diffuse lung infiltrates and moderate R effusion   - continue diuresis carefully - Will ask CCM to see for possible R thoracentesis  2. Large secundum ASD s/p closure:  - s/p successful transcatheter closure of a large secundum ASD using a 26 mm Amplatzer septal occluder deviceon 01/11/2020.  - Post op limited echo showed EF 60-65% with well seated ASD closure device with no residual flow. - Off Eliquis since 4/24 with dropping Hgb  3. Acute on chronic diastolic CHF with prominent RV failure  - swan numbers done personally.  - left-sided pressures much improved now with predominantly R-sided failure in setting of longstanding ASD - continue milrinone and IV lasix (dose reduced 25 -> 10/hr) - on NE to support BP. Will increase midodrine to 15 tid  4. Acute on CKD stage IV:  - creat baseline 2.1- 2.5.  - peaked at 3.2 likely due to cardiorenal and renal vein congestion - Creatinine 3.2 -> 2.9 -> 3.0 - continue hemodynamic support - trailysis cath in place if needed  5. Paroxysmal aifb/flutter:  - developed recurrent AF on 4/24 - Rates elevated.  - Will start IV amio while on inotropes - off Eliquis on 4/24 due to dropping hgb. Now stabilized. Hopefully can restart soon.  - No longer on metoprolol due to hypotension. Now on midodrine  6. CAD:  - s/p remotePCI to RCA, had stent thrombosis back in 2000.No chest pain. Continue medical therapy.  - No aspirin given need for Eliquis and recent GI bleed  7. Recent GI bleed with chronic anemia:  - hgb 12.6 -> 9.4 -> 8.8 -> 8.6 this am - no obvious site of bleeding x for oozing around trialysis cath - Holding Eliquis for now  - Continue to follow closely  8. Leukocytosis - Afebrile  WBC 15.2 -> 13.7 - No obvious source infection. Follow.   CRITICAL CARE Performed by: Glori Bickers  Total critical care time: 45 minutes  Critical care time was exclusive of separately billable procedures and treating other  patients.  Critical care was necessary to treat or prevent imminent or life-threatening deterioration.  Critical care was time spent personally by me (independent of midlevel providers or residents) on the following activities: development of treatment plan with patient and/or surrogate as well as nursing, discussions with consultants, evaluation of patient's response to treatment, examination of patient, obtaining history from patient or surrogate, ordering and performing treatments and interventions, ordering and review of laboratory studies, ordering and review of radiographic studies, pulse oximetry and re-evaluation of  patient's condition.      Length of Stay: 2   Glori Bickers MD 02/01/2020, 1:39 PM  Advanced Heart Failure Team Pager [phone] (M-F; [place])  Please contact [facility] for night-coverage after hours (4p -7a ) and weekends on [email or web address]

## 2020-02-01 NOTE — Plan of Care (Signed)
  Problem: Education: Goal: Knowledge of General Education information will improve Description: Including pain rating scale, medication(s)/side effects and non-pharmacologic comfort measures Outcome: Progressing   Problem: Health Behavior/Discharge Planning: Goal: Ability to manage health-related needs will improve Outcome: Progressing   Problem: Clinical Measurements: Goal: Ability to maintain clinical measurements within normal limits will improve Outcome: Progressing Goal: Will remain free from infection Outcome: Progressing   Problem: Nutrition: Goal: Adequate nutrition will be maintained Outcome: Progressing   Problem: Coping: Goal: Level of anxiety will decrease Outcome: Progressing   Problem: Elimination: Goal: Will not experience complications related to urinary retention Outcome: Progressing   Problem: Pain Managment: Goal: General experience of comfort will improve Outcome: Progressing   Problem: Safety: Goal: Ability to remain free from injury will improve Outcome: Progressing

## 2020-02-01 NOTE — Progress Notes (Signed)
Paged Dr Sung Amabile to discuss pt condition;  Pt back in Afib, controlled rate 90s to low 100s.  BP soft with SBP 80-90s, MAP > 60. Diuresing well with 700 ml this shift and 1.9 L during dayshift. Fine crackles, sats stable on 5 L n/c. Order to decr Lasix gtt to 10 mg/hr and start low dose levo gtt to maintain SBP > 90.   R IJ swan in place, PA waveform has been damped throughout shift, discussed with MD, does n ot appear wedged, no air in balloon, will have cards fellow come assess and verify.   L IJ HD catheter site continues to bleed despite thrombi-pad dressing placed on dayshift. Will hold pressure x 30 min and consider stitch if unable to control bleeding.   2240: Dr Blossom Hoops, cards fellow paged and updated, will be to bedside shortly.

## 2020-02-01 NOTE — Consult Note (Signed)
NAME:  Larry [name], MRN:  [id number], DOB:  [birth year], LOS: 2 ADMISSION DATE:  01/18/2020, CONSULTATION DATE:  4/25 REFERRING MD:  Bensimhon, CHIEF COMPLAINT:  Pleural effusion   Brief History   Chronic right heart failure due to ASD S/p ASD closure, now with acute respiratory failure due to acute pulmonary edema. Requiring BiPAP.  History of present illness   Larry [name] is a 79 y/o gentleman admitted for closure of a 3cm ASD diagnosed during evaluation of right heart failure.  He underwent ASD closure on 4/22.  He went into acute respiratory failure with worsening renal function on 4/24.  He had Swan-Ganz catheter, dialysis catheter placed to guide treatment.  He has had a good response to diuresis.  He was able to come off BiPAP yesterday and overnight, but today required BiPAP placement for escalating oxygen requirements.  On BiPAP is shortness of breath is controlled.  He denies other complaints.  No previous history of lung disease, never smoker.  Past Medical History  CKD ASD CAD HLD HTN  Significant Hospital Events   ASD closure 4/22 Right heart cath with Swan-Ganz placement, dialysis catheter placement 4/24 4/25 thoracentesis on the right Eliquis resolved 4/24 for decreased hemoglobin  Consults:  PCCM Cardiology  Procedures:  ASD closure 4/22 Right heart cath with Swan-Ganz placement, dialysis catheter placement 4/24 4/25 thoracentesis on the right  Significant Diagnostic Tests:  4/25 G[place]y Councilman data: CVP 15 PA 36/26 (31) PCWP 19 Thermo CO/CI 7.6/3.7 PAPi 0.67  Micro Data:    Antimicrobials:     Interim history/subjective:    Objective   Blood pressure 98/64, pulse (!) 111, temperature 98.1 F (36.7 C), resp. rate 17, height 6' (1.829 m), weight 85.4 kg, SpO2 99 %. PAP: (29-47)/(23-41) 41/32 CVP:  [12 mmHg-21 mmHg] 13 mmHg PCWP:  [22 mmHg-31 mmHg] 28 mmHg CO:  [7.1 L/min-7.9 L/min] 7.5 L/min CI:  [3.4 L/min/m2-3.8 L/min/m2] 3.6 L/min/m2  FiO2 (%):   [40 %] 40 %   Intake/Output Summary (Last 24 hours) at 02/01/2020 1458 Last data filed at 02/01/2020 1400 Gross per 24 hour  Intake 872.62 ml  Output 4085 ml  Net -3212.38 ml   Filed Weights   01/28/2020 2116 01/31/20 0300 02/01/20 0630  Weight: 87.7 kg 87.7 kg 85.4 kg    Examination: General: Ill-appearing elderly man sitting in bed in BiPAP, in no acute distress HENT: [place]/AT, eyes anicteric Lungs: Breathing comfortably on BiPAP, no significant tachypnea or accessory muscle use Cardiovascular: Mild tachycardia, regular rhythm Abdomen: Nondistended Extremities: Mild pitting edema in legs up to mid thighs Neuro: Awake and alert, answering questions verbally, globally very weak moving all extremities spontaneously GU: Coud catheter in place draining bloody urine  Post Thora CXR 4/25-significantly decreased right pleural effusion, increased aeration of the right lower lobe.  Persistent reticular opacities bilaterally  Resolved Hospital Problem list     Assessment & Plan:  Acute hypoxic respiratory failure, presumed due to pulmonary edema complicated by right-sided enlarging pleural effusion -Agree with thoracentesis today.  Discussed increased risk of bleeding with previous Eliquis use, but the patient and his family decided to proceed given her concern for progressive respiratory decline that could lead to intubation. -1700 cc removed.  Sent for culture, cell count, glucose, LDH, protein -Discussed with Dr. Haroldine Laws the possibility of possible amiodarone induced lung toxicity.  Checking ESR. -Agree with ongoing diuresis -BiPAP for several hours after thoracentesis and overnight.  Okay to try coming off later this evening.  Best practice:  Per  primary Family Communication: Updated wife and daughter at bedside Disposition: ICU  Labs   CBC: Recent Labs  Lab 01/27/20 1049 01/15/2020 1704 01/28/2020 2000 01/28/2020 2009 01/31/20 0538 01/31/20 1249 01/31/20 1726 02/01/20 0232   WBC 12.1* 22.7*  --   --  18.3* 16.8* 15.2* 13.7*  NEUTROABS 9.9*  --   --   --   --   --   --   --   HGB 12.3* 12.3*   < > 12.6* 9.4* 8.7* 8.8* 8.6*  HCT 36.6* 38.4*   < > 37.0* 30.2* 27.8* 27.6* 26.7*  MCV 88 95.0  --   --  96.2 94.6 94.2 94.0  PLT 185 163  --   --  132* 123* 126* 120*   < > = values in this interval not displayed.    Basic Metabolic Panel: Recent Labs  Lab 01/27/20 1049 01/27/20 1049 01/22/2020 1106 01/28/2020 2000 01/18/2020 2009 01/31/20 0538 02/01/20 0232  NA 125*  --  128* 129*  132* 129* 131* 132*  K 3.9   < > 4.3 5.0  4.7 4.8 4.8 3.9  CL 87*  --  91*  --  90* 93* 89*  CO2 30*  --  25  --   --  26 29  GLUCOSE 138*  --  149*  --  105* 125* 121*  BUN 74*  --  69*  --  62* 75* 83*  CREATININE 2.75*  --  2.85*  --  3.20* 2.96* 3.04*  CALCIUM 9.3  --  8.7*  --   --  8.5* 8.4*   < > = values in this interval not displayed.   GFR: Estimated Creatinine Clearance: 22.3 mL/min (A) (by C-G formula based on SCr of 3.04 mg/dL (H)). Recent Labs  Lab 01/31/20 0538 01/31/20 1249 01/31/20 1726 02/01/20 0232  WBC 18.3* 16.8* 15.2* 13.7*    Liver Function Tests: No results for input(s): AST, ALT, ALKPHOS, BILITOT, PROT, ALBUMIN in the last 168 hours. No results for input(s): LIPASE, AMYLASE in the last 168 hours. No results for input(s): AMMONIA in the last 168 hours.  ABG    Component Value Date/Time   HCO3 28.8 (H) 01/26/2020 2000   HCO3 29.5 (H) 02/02/2020 2000   TCO2 31 01/17/2020 2009   ACIDBASEDEF 2.0 12/25/2019 0845   O2SAT 73.0 02/01/2020 0232     Coagulation Profile: No results for input(s): INR, PROTIME in the last 168 hours.  Cardiac Enzymes: No results for input(s): CKTOTAL, CKMB, CKMBINDEX, TROPONINI in the last 168 hours.  HbA1C: No results found for: HGBA1C  CBG: No results for input(s): GLUCAP in the last 168 hours.  Review of Systems:   +SOB, otherwise negative  Past Medical History  He,  has a past medical history of  Coronary artery disease, Hypercholesterolemia, Hypertension, Myocardial infarct ([place]), Olecranon bursitis, and S/P atrial septal defect closure.   Surgical History    Past Surgical History:  Procedure Laterality Date  . ATRIAL SEPTAL DEFECT(ASD) CLOSURE N/A 01/14/2020   Procedure: ATRIAL SEPTAL DEFECT (ASD) CLOSURE;  Surgeon: Sherren Mocha, MD;  Location: [facility];  Service: Cardiovascular;  Laterality: N/A;  . CARDIAC CATHETERIZATION  12/15/1999   EF was 55%   . COLONOSCOPY WITH PROPOFOL N/A 12/28/2019   Procedure: COLONOSCOPY WITH PROPOFOL;  Surgeon: Arta Silence, MD;  Location: [facility];  Service: Endoscopy;  Laterality: N/A;  . CORONARY ANGIOPLASTY WITH STENT PLACEMENT  08/15/1999   CAD, status post prior stenting of the mid  to distal RCA in 11/1998, with an acute diaphragmatic wall infarction due to total occlusion at the stent site/There is also 70% narrowing in the diagonal branch of the LAD/The LV showed inferior wall hypo- akinesis.-- Successful reperfusion, percutaneous transluminal coronary percutaneous transluminal coronary & placement of a 2nd overlying stent in RCA  . ESOPHAGOGASTRODUODENOSCOPY (EGD) WITH PROPOFOL N/A 12/28/2019   Procedure: ESOPHAGOGASTRODUODENOSCOPY (EGD) WITH PROPOFOL;  Surgeon: Arta Silence, MD;  Location: [facility];  Service: Endoscopy;  Laterality: N/A;  . POLYPECTOMY  12/28/2019   Procedure: POLYPECTOMY;  Surgeon: Arta Silence, MD;  Location: [facility] ENDOSCOPY;  Service: Endoscopy;;  . RIGHT HEART CATH N/A 12/25/2019   Procedure: RIGHT HEART CATH;  Surgeon: Larey Dresser, MD;  Location: [facility];  Service: Cardiovascular;  Laterality: N/A;  . TEE WITHOUT CARDIOVERSION N/A 12/25/2019   Procedure: TRANSESOPHAGEAL ECHOCARDIOGRAM (TEE);  Surgeon: Larey Dresser, MD;  Location: [facility] ENDOSCOPY;  Service: Cardiovascular;  Laterality: N/A;     Social History   reports that he quit smoking about 20 years ago. His smoking use included  cigarettes. He has never used smokeless tobacco. He reports that he does not drink alcohol or use drugs.   Family History   His family history includes Cancer in his father; Heart Problems in his mother.   Allergies Allergies  Allergen Reactions  . Plavix [Clopidogrel Bisulfate] Anaphylaxis          Home Medications  Prior to Admission medications   Medication Sig Start Date End Date Taking? Authorizing Provider  acetaminophen (TYLENOL) 500 MG tablet Take 1,000 mg by mouth as needed for moderate pain (back pain).    Yes [provider]  amiodarone (PACERONE) 200 MG tablet Take 1 tablet (200 mg total) by mouth 2 (two) times daily. 12/30/19  Yes Mercy Riding, MD  apixaban (ELIQUIS) 5 MG TABS tablet Take 1 tablet (5 mg total) by mouth 2 (two) times daily. 01/04/20  Yes Mercy Riding, MD  aspirin EC 81 MG tablet Take 1 tablet (81 mg total) by mouth daily. 01/26/20  Yes Sherren Mocha, MD  ferrous sulfate 325 (65 FE) MG tablet Take 1 tablet (325 mg total) by mouth 2 (two) times daily with a meal. 01/08/20 07/06/20 Yes Gonfa, Charlesetta Ivory, MD  loratadine (CLARITIN) 10 MG tablet Take 10 mg by mouth daily as needed for allergies or itching.    Yes [provider]  midodrine (PROAMATINE) 5 MG tablet Take 1 tablet (5 mg total) by mouth 3 (three) times daily with meals. 12/30/19  Yes Mercy Riding, MD  pantoprazole (PROTONIX) 40 MG tablet Take 1 tablet (40 mg total) by mouth 2 (two) times daily. 12/30/19 06/27/20 Yes Mercy Riding, MD  polyethylene glycol powder (MIRALAX) 17 GM/SCOOP powder Take 17 g by mouth 2 (two) times daily as needed for moderate constipation. 12/30/19  Yes Mercy Riding, MD  potassium chloride SA (KLOR-CON) 20 MEQ tablet Take 20 mEq by mouth daily.   Yes [provider]  pravastatin (PRAVACHOL) 40 MG tablet Take 1 tablet (40 mg total) by mouth every evening. 12/04/19  Yes Clegg, Amy D, NP  torsemide (DEMADEX) 20 MG tablet Take 4 tablets (80 mg total) by mouth daily.  01/22/20  Yes Lyda Jester M, PA-C  nitroGLYCERIN (NITROSTAT) 0.4 MG SL tablet Place 1 tablet (0.4 mg total) under the tongue every 5 (five) minutes as needed for chest pain (3 doses max). 02/19/18   Imogene Burn, PA-C  Julian Hy, DO 02/01/20 7:13 PM [facility]

## 2020-02-01 NOTE — Care Plan (Signed)
Evaluated PAC at bedside.  Withdrawn and inflated (no resistance) without change in waveform.  Low PAPI as prior (30/22) with similar values to prior so low concern for wedge position.  Will reevaluate LIJ temporary dialysis catheter at later time.  Larry Ghee MD PGY4 Cardiology

## 2020-02-01 NOTE — Progress Notes (Signed)
RN called about pt SPo2 dropping to mid 80's during bed linen change. RN had increased [place] to 6L and upon my arrival pt was 89 and came up to 91% in talking with patient. Talked with patient about wearing BIPAP briefly due to increased O2 demands and pt agreed. Pt came up to 96% and tolerating BIPAP well at this time.

## 2020-02-02 ENCOUNTER — Inpatient Hospital Stay (HOSPITAL_COMMUNITY): Payer: PPO

## 2020-02-02 DIAGNOSIS — J9601 Acute respiratory failure with hypoxia: Secondary | ICD-10-CM | POA: Diagnosis not present

## 2020-02-02 DIAGNOSIS — J948 Other specified pleural conditions: Secondary | ICD-10-CM | POA: Diagnosis not present

## 2020-02-02 DIAGNOSIS — Z8774 Personal history of (corrected) congenital malformations of heart and circulatory system: Secondary | ICD-10-CM | POA: Diagnosis not present

## 2020-02-02 DIAGNOSIS — I5033 Acute on chronic diastolic (congestive) heart failure: Secondary | ICD-10-CM | POA: Diagnosis not present

## 2020-02-02 LAB — CBC
HCT: 24.5 % — ABNORMAL LOW (ref 39.0–52.0)
HCT: 25.2 % — ABNORMAL LOW (ref 39.0–52.0)
Hemoglobin: 8 g/dL — ABNORMAL LOW (ref 13.0–17.0)
Hemoglobin: 8.2 g/dL — ABNORMAL LOW (ref 13.0–17.0)
MCH: 30.2 pg (ref 26.0–34.0)
MCH: 29.5 pg (ref 26.0–34.0)
MCHC: 32.7 g/dL (ref 30.0–36.0)
MCHC: 32.5 g/dL (ref 30.0–36.0)
MCV: 92.5 fL (ref 80.0–100.0)
MCV: 90.6 fL (ref 80.0–100.0)
Platelets: 91 10*3/uL — ABNORMAL LOW (ref 150–400)
Platelets: 88 10*3/uL — ABNORMAL LOW (ref 150–400)
RBC: 2.65 MIL/uL — ABNORMAL LOW (ref 4.22–5.81)
RBC: 2.78 MIL/uL — ABNORMAL LOW (ref 4.22–5.81)
RDW: 21.2 % — ABNORMAL HIGH (ref 11.5–15.5)
RDW: 21.1 % — ABNORMAL HIGH (ref 11.5–15.5)
WBC: 14 10*3/uL — ABNORMAL HIGH (ref 4.0–10.5)
WBC: 14.9 10*3/uL — ABNORMAL HIGH (ref 4.0–10.5)
nRBC: 0 % (ref 0.0–0.2)
nRBC: 0 % (ref 0.0–0.2)

## 2020-02-02 LAB — BASIC METABOLIC PANEL
Anion gap: 15 (ref 5–15)
Anion gap: 14 (ref 5–15)
BUN: 87 mg/dL — ABNORMAL HIGH (ref 8–23)
BUN: 88 mg/dL — ABNORMAL HIGH (ref 8–23)
CO2: 29 mmol/L (ref 22–32)
CO2: 29 mmol/L (ref 22–32)
Calcium: 8.3 mg/dL — ABNORMAL LOW (ref 8.9–10.3)
Calcium: 8.6 mg/dL — ABNORMAL LOW (ref 8.9–10.3)
Chloride: 85 mmol/L — ABNORMAL LOW (ref 98–111)
Chloride: 86 mmol/L — ABNORMAL LOW (ref 98–111)
Creatinine, Ser: 3.07 mg/dL — ABNORMAL HIGH (ref 0.61–1.24)
Creatinine, Ser: 3.09 mg/dL — ABNORMAL HIGH (ref 0.61–1.24)
GFR calc Af Amer: 22 mL/min — ABNORMAL LOW (ref >60)
GFR calc Af Amer: 21 mL/min — ABNORMAL LOW (ref >60)
GFR calc non Af Amer: 19 mL/min — ABNORMAL LOW (ref >60)
GFR calc non Af Amer: 18 mL/min — ABNORMAL LOW (ref >60)
Glucose, Bld: 171 mg/dL — ABNORMAL HIGH (ref 70–99)
Glucose, Bld: 138 mg/dL — ABNORMAL HIGH (ref 70–99)
Potassium: 3.1 mmol/L — ABNORMAL LOW (ref 3.5–5.1)
Potassium: 3.6 mmol/L (ref 3.5–5.1)
Sodium: 129 mmol/L — ABNORMAL LOW (ref 135–145)
Sodium: 129 mmol/L — ABNORMAL LOW (ref 135–145)

## 2020-02-02 LAB — IRON AND TIBC
Iron: 32 ug/dL — ABNORMAL LOW (ref 45–182)
Saturation Ratios: 12 % — ABNORMAL LOW (ref 17.9–39.5)
TIBC: 256 ug/dL (ref 250–450)
UIBC: 224 ug/dL

## 2020-02-02 LAB — COOXEMETRY PANEL
Carboxyhemoglobin: 2.5 % — ABNORMAL HIGH (ref 0.5–1.5)
Methemoglobin: 1.5 % (ref 0.0–1.5)
O2 Saturation: 71.9 %
Total hemoglobin: 9.1 g/dL — ABNORMAL LOW (ref 12.0–16.0)

## 2020-02-02 LAB — MAGNESIUM: Magnesium: 2.3 mg/dL (ref 1.7–2.4)

## 2020-02-02 LAB — FERRITIN: Ferritin: 158 ng/mL (ref 24–336)

## 2020-02-02 MED ORDER — APIXABAN 5 MG PO TABS
5.0000 mg | ORAL_TABLET | Freq: Two times a day (BID) | ORAL | Status: DC
  Administered 2020-02-02 – 2020-02-04 (×3): 5 mg via ORAL
  Filled 2020-02-02 (×5): qty 1

## 2020-02-02 MED ORDER — POTASSIUM CHLORIDE CRYS ER 20 MEQ PO TBCR
30.0000 meq | EXTENDED_RELEASE_TABLET | Freq: Once | ORAL | Status: AC
  Administered 2020-02-02: 30 meq via ORAL
  Filled 2020-02-02: qty 1

## 2020-02-02 MED ORDER — POTASSIUM CHLORIDE 20 MEQ PO PACK: 40.0000 meq | PACK | ORAL | Status: DC

## 2020-02-02 MED ORDER — METOLAZONE 2.5 MG PO TABS
2.5000 mg | ORAL_TABLET | Freq: Once | ORAL | Status: AC
  Administered 2020-02-02: 09:00:00 2.5 mg via ORAL
  Filled 2020-02-02: qty 1

## 2020-02-02 MED ORDER — POTASSIUM CHLORIDE CRYS ER 20 MEQ PO TBCR
40.0000 meq | EXTENDED_RELEASE_TABLET | ORAL | Status: DC
  Filled 2020-02-02: qty 2

## 2020-02-02 MED ORDER — SODIUM CHLORIDE 0.9 % IV SOLN: INTRAVENOUS | Status: DC | PRN

## 2020-02-02 MED ORDER — POTASSIUM CHLORIDE 20 MEQ/15ML (10%) PO SOLN
40.0000 meq | ORAL | Status: AC
  Administered 2020-02-02 (×2): 40 meq via ORAL
  Filled 2020-02-02 (×2): qty 30

## 2020-02-02 NOTE — Progress Notes (Signed)
Patient's legs wrapped with compression wraps, not [place] boots as per notes patient has an intolerance to [employer].  RN to take off compression wraps daily to assess skin and re-wrap legs.   Compression wraps removed for one hour and legs rewrapped.   Two small deep tissue injuries noted on left upper foot and LDA's added.   Abrasion on right shin noted that was present prior to admission.

## 2020-02-02 NOTE — Progress Notes (Addendum)
Patient ID: Larry [name], male   DOB: 1941/06/22, 79 y.o.   MRN: [id number]    Advanced Heart Failure Rounding Note   Subjective:    Developed acute pulmonary edema with respiratory failure and worsening renal function.  Underwent placement of a Swan-Ganz catheter and temporary dialysis catheter.  Remains on milrinone 0.125. He is on norepinephrine 14 with hypotension. Also on midodrine 5 mg tid.  SBP 90s generally.   Good diuresis again, Lasix gtt at 10 mg/hr.  Had right thoracentesis yesterday (transudate).  Remains on 6L [place].   Remains in atrial fibrillation, rate 110s on amiodarone gtt 30 mg/hr.  Not on Eliquis due to anemia/bleeding currently.  Bleeding at catheter site has stopped, hgb 8 currently.   Creatinine 3.2-> 2.9 -> 3.0 -> 3.07.     Swan #s done personally CVP 14 PA 32/26  Thermo CI 2.8 SVR 879 Co-ox 72%  Echo (4/21): EF 60-65%, RV looked relatively normal, ASD closure device intact, dilated IVC  Objective:   Weight Range:  Vital Signs:   Temp:  [98.1 F (36.7 C)-99.7 F (37.6 C)] 99.5 F (37.5 C) (04/26 0700) Pulse Rate:  [99-124] 124 (04/26 0700) Resp:  [12-31] 22 (04/26 0700) BP: (73-119)/(43-75) 94/64 (04/26 0700) SpO2:  [80 %-100 %] 100 % (04/26 0700) Weight:  [86.2 kg] 86.2 kg (04/26 0500) Last BM Date: 01/31/2020  Weight change: Filed Weights   01/31/20 0300 02/01/20 0630 02/02/20 0500  Weight: 87.7 kg 85.4 kg 86.2 kg    Intake/Output:   Intake/Output Summary (Last 24 hours) at 02/02/2020 0715 Last data filed at 02/02/2020 0600 Gross per 24 hour  Intake 2290.5 ml  Output 3025 ml  Net -734.5 ml     Physical Exam: General: NAD Neck: JVP 14 cm, no thyromegaly or thyroid nodule.  Lungs: Crackles at bases. CV: Nondisplaced PMI.  Heart tachy, irregular S1/S2, no S3/S4, 2/6 HSM LLSB.  2+ edema to knees. Abdomen: Soft, nontender, no hepatosplenomegaly, no distention.  Skin: Intact without lesions or rashes.  Neurologic: Alert and oriented x 3.   Psych: Normal affect. Extremities: No clubbing or cyanosis.  HEENT: Normal.   Telemetry: AF 110s Personally reviewed   Labs: Basic Metabolic Panel: Recent Labs  Lab 01/27/20 1049 01/27/20 1049 01/15/2020 1106 01/27/2020 1106 01/19/2020 2000 02/02/2020 2009 01/31/20 0538 02/01/20 0232 02/02/20 0515  NA 125*  --  128*   < > 129*  132* 129* 131* 132* 129*  K 3.9   < > 4.3   < > 5.0  4.7 4.8 4.8 3.9 3.1*  CL 87*   < > 91*  --   --  90* 93* 89* 85*  CO2 30*  --  25  --   --   --  26 29 29   GLUCOSE 138*  --  149*  --   --  105* 125* 121* 171*  BUN 74*  --  69*  --   --  62* 75* 83* 87*  CREATININE 2.75*   < > 2.85*  --   --  3.20* 2.96* 3.04* 3.07*  CALCIUM 9.3   < > 8.7*   < >  --   --  8.5* 8.4* 8.3*  MG  --   --   --   --   --   --   --   --  2.3   < > = values in this interval not displayed.    Liver Function Tests: No results for input(s): AST, ALT, ALKPHOS, BILITOT, PROT,  ALBUMIN in the last 168 hours. No results for input(s): LIPASE, AMYLASE in the last 168 hours. No results for input(s): AMMONIA in the last 168 hours.  CBC: Recent Labs  Lab 01/27/20 1049 01/09/2020 1704 01/31/20 0538 01/31/20 1249 01/31/20 1726 02/01/20 0232 02/02/20 0515  WBC 12.1*   < > 18.3* 16.8* 15.2* 13.7* 14.0*  NEUTROABS 9.9*  --   --   --   --   --   --   HGB 12.3*   < > 9.4* 8.7* 8.8* 8.6* 8.0*  HCT 36.6*   < > 30.2* 27.8* 27.6* 26.7* 24.5*  MCV 88   < > 96.2 94.6 94.2 94.0 92.5  PLT 185   < > 132* 123* 126* 120* 91*   < > = values in this interval not displayed.    Cardiac Enzymes: No results for input(s): CKTOTAL, CKMB, CKMBINDEX, TROPONINI in the last 168 hours.  BNP: BNP (last 3 results) Recent Labs    12/19/19 1404 01/15/20 1422 01/23/2020 1106  BNP 192.2* 607.6* 573.5*    ProBNP (last 3 results) No results for input(s): PROBNP in the last 8760 hours.    Other results:  Imaging: DG CHEST PORT 1 VIEW  Result Date: 02/01/2020 CLINICAL DATA:  Status post  thoracentesis. EXAM: PORTABLE CHEST 1 VIEW COMPARISON:  02/01/2020 FINDINGS: RIGHT pleural effusion has decreased. There is no evidence of pneumothorax. Diffuse bilateral airspace opacities are again identified, slightly improved in the RIGHT LOWER lung. A RIGHT IJ Swan-Ganz catheter with tip overlying the RIGHT pulmonary artery and LEFT IJ central venous catheter with tip overlying the SVC again noted. No other interval change. IMPRESSION: Decreased RIGHT pleural effusion and improved RIGHT LOWER lung aeration. No evidence of pneumothorax. Electronically Signed   By: Margarette Canada M.D.   On: 02/01/2020 18:03   DG CHEST PORT 1 VIEW  Result Date: 02/01/2020 CLINICAL DATA:  Right heart catheterization. EXAM: PORTABLE CHEST 1 VIEW COMPARISON:  January 31, 2020 FINDINGS: Stable PA catheter and left central line. No pneumothorax. Diffuse bilateral pulmonary opacities are similar in the interval. The moderate right-sided pleural effusion is stable. The cardiomediastinal silhouette is stable. No other acute interval changes. IMPRESSION: 1. Support apparatus as above. 2. Diffuse bilateral pulmonary opacities remain, stable in the interval. The right-sided pleural effusion, moderate size, is also stable. Electronically Signed   By: Dorise Bullion III M.D   On: 02/01/2020 12:55   DG CHEST PORT 1 VIEW  Result Date: 01/31/2020 CLINICAL DATA:  Respiratory failure, hypoxia EXAM: PORTABLE CHEST 1 VIEW COMPARISON:  01/10/2020 FINDINGS: Interval placement of left neck vascular catheter, tip over the superior SVC. Interval placement of right neck pulmonary arterial catheter, tip positioned over the right pulmonary artery. No significant change in extensive, diffuse bilateral interstitial and heterogeneous airspace opacity with a moderate right pleural effusion. Cardiomegaly. IMPRESSION: 1. Interval placement of left neck vascular catheter, tip over the superior SVC. Interval placement of right neck pulmonary arterial catheter,  tip positioned over the right pulmonary artery. 2. No significant change in extensive, diffuse bilateral interstitial and heterogeneous airspace opacity with a moderate right pleural effusion. 3.  Cardiomegaly. Electronically Signed   By: Eddie Candle M.D.   On: 01/31/2020 12:25     Medications:     Scheduled Medications: . Chlorhexidine Gluconate Cloth  6 each Topical Daily  . mouth rinse  15 mL Mouth Rinse BID  . metolazone  2.5 mg Oral Once  . midodrine  15 mg Oral TID WC  .  pantoprazole  40 mg Oral BID  . pravastatin  40 mg Oral QPM  . sodium chloride flush  10-40 mL Intracatheter Q12H  . sodium chloride flush  3 mL Intravenous Q12H    Infusions: . amiodarone 30 mg/hr (02/02/20 2841)  . furosemide (LASIX) infusion 10 mg/hr (02/02/20 0600)  . milrinone 0.125 mcg/kg/min (02/02/20 0600)  . norepinephrine (LEVOPHED) Adult infusion 14 mcg/min (02/02/20 0653)    PRN Medications: acetaminophen, nitroGLYCERIN, ondansetron (ZOFRAN) IV, oxyCODONE, polyethylene glycol, sodium chloride flush   Assessment.Plan:   1. Acute hypoxic respiratory failure with acute pulmonary edema and R pleural effusion: Due to pulmonary edema s/p ASD closure. Remains on 6L [place].  S/p right thoracentesis 4/25, transudative (CHF).  2. Large secundum ASD s/p closure: s/p successful transcatheter closure of a large secundum ASD using a 26 mm Amplatzer septal occluder deviceon 01/28/2020.  Post op limited echo showed EF 60-65% with well seated ASD closure device with no residual flow. 3. Acute on chronic diastolic CHF with prominent RV failure: Still with predominant RV failure by Luiz Blare in setting of long-standing ASD, but also pulmonary edema after ASD closure (PADP 26 this morning).  Still volume overloaded and still with significant oxygen requirement (not on home oxygen).  MAP marginal as well, currently on milrinone 0.125, NE 14, and midodrine 15 mg tid with good cardiac index and co-ox 71%.  CVP remains 14.  SVR 879.   - Continue milrinone 0.125 for now.  - Wean NE as able, continue midodrine 15 mg tid. I think it may be helpful to have arterial line.  - Lasix increase to 12 mg/hr and will give 1 dose metolazone 2.5.  Replace K.  4. Acute on CKD stage IV: Creatinine baseline 2.1- 2.5.  Creatinine 3.2 -> 2.9 -> 3.0 -> 3.07.  - continue hemodynamic support - HD cath in place if needed 5. Paroxysmal aifb/flutter: developed recurrent AF on 4/24.  Eliquis stopped 4/24 due to oozing at line site with falling hgb.  - Continue amiodarone 30 mg/hr.  - Repeat CBC at 1 pm, restart Eliquis if stable.  - Fe studies.  6. CAD: s/p remotePCI to RCA, had stent thrombosis back in 2000.No chest pain.  - Pravastatin.  - No ASA with Eliquis use.  7. Recent GI bleed with chronic anemia: hgb 12.6 -> 9.4 -> 8.8 -> 8.6 -> 8 this am.  No obvious site of bleeding except for oozing around trialysis cath that has resolved.  - CBC at 1 pm, restart Eliquis if stable.  - Fe studies.  8. ID: Afebrile,  WBC 15.2 -> 13.7 -> 14.  - No obvious source infection. Follow.  9. Hyponatremia: Hypervolemic hyponatremia.  - Fluid restrict.  10. Thrombocytopenia: Suspect related to acute medical illness. No heparin.  Follow.   CRITICAL CARE Performed by: [name]  Total critical care time: 40 minutes  Critical care time was exclusive of separately billable procedures and treating other patients.  Critical care was necessary to treat or prevent imminent or life-threatening deterioration.  Critical care was time spent personally by me (independent of midlevel providers or residents) on the following activities: development of treatment plan with patient and/or surrogate as well as nursing, discussions with consultants, evaluation of patient's response to treatment, examination of patient, obtaining history from patient or surrogate, ordering and performing treatments and interventions, ordering and review of laboratory studies, ordering  and review of radiographic studies, pulse oximetry and re-evaluation of patient's condition.    Length of Stay: 3  [name] MD 02/02/2020, 7:15 AM  Advanced Heart Failure Team Pager [phone] (M-F; 7a - 4p)  Please contact [facility] for night-coverage after hours (4p -7a ) and weekends on [email or web address]

## 2020-02-02 NOTE — Progress Notes (Signed)
NAME:  Larry [name], MRN:  [id number], DOB:  02/26/1941, LOS: 3 ADMISSION DATE:  02/01/2020, CONSULTATION DATE:  4/25 REFERRING MD:  Bensimhon, CHIEF COMPLAINT:  Pleural effusion   Brief History   Chronic right heart failure due to ASD S/p ASD closure, now with acute respiratory failure due to acute pulmonary edema. Requiring BiPAP.  History of present illness   Larry [name] is a 79 y/o gentleman admitted for closure of a 3cm ASD diagnosed during evaluation of right heart failure.  He underwent ASD closure on 4/22.  He went into acute respiratory failure with worsening renal function on 4/24.  He had Swan-Ganz catheter, dialysis catheter placed to guide treatment.  He has had a good response to diuresis.  He was able to come off BiPAP yesterday and overnight, but today required BiPAP placement for escalating oxygen requirements.  On BiPAP is shortness of breath is controlled.  He denies other complaints.  No previous history of lung disease, never smoker.  Past Medical History  CKD ASD CAD HLD HTN  Significant Hospital Events   ASD closure 4/22 Right heart cath with Swan-Ganz placement, dialysis catheter placement 4/24 4/25 thoracentesis on the right Eliquis resolved 4/24 for decreased hemoglobin  Consults:  PCCM Cardiology  Procedures:  ASD closure 4/22 Right heart cath with Swan-Ganz placement, dialysis catheter placement 4/24 4/25 thoracentesis on the right  Significant Diagnostic Tests:  CXR (4/25): Decreased RIGHT pleural effusion and improved RIGHT LOWER lung aeration. No evidence of pneumothorax.  CXR (4/26): Generalized pulmonary opacity with interval progression. Stable small right pleural effusion.  Micro Data:  4/26: Pleural fluid > WBC cells, no organisms, culture NGTD at 24 hours  Antimicrobials:  4/21: Cefazolin x1    Interim history/subjective:   Tolerated Bipap overnight without difficulty, transitioned to nasal cannula this AM.   Objective   Blood  pressure 94/64, pulse (!) 124, temperature 99.5 F (37.5 C), resp. rate (!) 22, height 6' (1.829 m), weight 86.2 kg, SpO2 100 %. PAP: (29-61)/(23-54) 32/26 CVP:  [12 mmHg-23 mmHg] 15 mmHg CO:  [8.9 L/min] 8.9 L/min CI:  [4.3 L/min/m2] 4.3 L/min/m2      Intake/Output Summary (Last 24 hours) at 02/02/2020 1016 Last data filed at 02/02/2020 0900 Gross per 24 hour  Intake 2183.33 ml  Output 3625 ml  Net -1441.67 ml   Filed Weights   01/31/20 0300 02/01/20 0630 02/02/20 0500  Weight: 87.7 kg 85.4 kg 86.2 kg    Examination: General: Ill-appearing elderly man, sleeping comfortably in no acute distress. Afebrile.  Lungs: Bilateral crackles in the bases. No wheezing. No accessory muscle use. Saturating at 88% on 6L [place].  Extremities: Bilateral 2+ pitting edema  Neuro: Sleeping during examination GU: Coud catheter in place draining bloody urine  Resolved Hospital Problem list     Assessment & Plan:  # Acute hypoxic respiratory failure # Pulmonary edema # Right-sided pleural effusion - Thoracentesis yesterday with 1.7L of fluid removal. Fluid studies consistent with transudative nature, no evidence of infectious nature at this time, however final cultures are still pending.  - ESR is mildly elevated likely due to procedures since hospitalization.  - Amiodarone induced lung injury is certainly still on the differential, as CXR today is not significantly improved, there continues to be interstitial and airspace opacities, however this may be from difficulty with diuresis.  P: - Continue supplemental oxygen PRN - Wean oxygen as tolerated - Continue aggressive diuresis with Lasix gtt per AHF team  - Consider discontinuing Amiodarone if  no improvements in CXR in the coming days  Best practice:  Per primary Disposition: ICU  Labs   CBC: Recent Labs  Lab 01/27/20 1049 02/04/2020 1704 01/31/20 0538 01/31/20 1249 01/31/20 1726 02/01/20 0232 02/02/20 0515  WBC 12.1*   < > 18.3* 16.8*  15.2* 13.7* 14.0*  NEUTROABS 9.9*  --   --   --   --   --   --   HGB 12.3*   < > 9.4* 8.7* 8.8* 8.6* 8.0*  HCT 36.6*   < > 30.2* 27.8* 27.6* 26.7* 24.5*  MCV 88   < > 96.2 94.6 94.2 94.0 92.5  PLT 185   < > 132* 123* 126* 120* 91*   < > = values in this interval not displayed.    Basic Metabolic Panel: Recent Labs  Lab 01/27/20 1049 01/27/20 1049 01/31/2020 1106 01/27/2020 1106 01/18/2020 2000 01/18/2020 2009 01/31/20 0538 02/01/20 0232 02/02/20 0515  NA 125*  --  128*   < > 129*  132* 129* 131* 132* 129*  K 3.9   < > 4.3   < > 5.0  4.7 4.8 4.8 3.9 3.1*  CL 87*   < > 91*  --   --  90* 93* 89* 85*  CO2 30*  --  25  --   --   --  '26 29 29  ' GLUCOSE 138*  --  149*  --   --  105* 125* 121* 171*  BUN 74*  --  69*  --   --  62* 75* 83* 87*  CREATININE 2.75*   < > 2.85*  --   --  3.20* 2.96* 3.04* 3.07*  CALCIUM 9.3  --  8.7*  --   --   --  8.5* 8.4* 8.3*  MG  --   --   --   --   --   --   --   --  2.3   < > = values in this interval not displayed.   GFR: Estimated Creatinine Clearance: 22.1 mL/min (A) (by C-G formula based on SCr of 3.07 mg/dL (H)). Recent Labs  Lab 01/31/20 1249 01/31/20 1726 02/01/20 0232 02/02/20 0515  WBC 16.8* 15.2* 13.7* 14.0*    Liver Function Tests: No results for input(s): AST, ALT, ALKPHOS, BILITOT, PROT, ALBUMIN in the last 168 hours. No results for input(s): LIPASE, AMYLASE in the last 168 hours. No results for input(s): AMMONIA in the last 168 hours.  ABG    Component Value Date/Time   HCO3 28.8 (H) 01/15/2020 2000   HCO3 29.5 (H) 01/10/2020 2000   TCO2 31 02/03/2020 2009   ACIDBASEDEF 2.0 12/25/2019 0845   O2SAT 71.9 02/02/2020 0515     Coagulation Profile: No results for input(s): INR, PROTIME in the last 168 hours.  Cardiac Enzymes: No results for input(s): CKTOTAL, CKMB, CKMBINDEX, TROPONINI in the last 168 hours.  HbA1C: No results found for: HGBA1C  CBG: No results for input(s): GLUCAP in the last 168 hours.  Review of  Systems:   +SOB, otherwise negative  Past Medical History  He,  has a past medical history of Coronary artery disease, Hypercholesterolemia, Hypertension, Myocardial infarct ([place]), Olecranon bursitis, and S/P atrial septal defect closure.   Surgical History    Past Surgical History:  Procedure Laterality Date  . ATRIAL SEPTAL DEFECT(ASD) CLOSURE N/A 01/16/2020   Procedure: ATRIAL SEPTAL DEFECT (ASD) CLOSURE;  Surgeon: Sherren Mocha, MD;  Location: [place] [facility];  Service: Cardiovascular;  Laterality: N/A;  .  CARDIAC CATHETERIZATION  12/15/1999   EF was 55%   . COLONOSCOPY WITH PROPOFOL N/A 12/28/2019   Procedure: COLONOSCOPY WITH PROPOFOL;  Surgeon: Arta Silence, MD;  Location: [place];  Service: Endoscopy;  Laterality: N/A;  . CORONARY ANGIOPLASTY WITH STENT PLACEMENT  08/15/1999   CAD, status post prior stenting of the mid to distal RCA in 11/1998, with an acute diaphragmatic wall infarction due to total occlusion at the stent site/There is also 70% narrowing in the diagonal branch of the LAD/The LV showed inferior wall hypo- akinesis.-- Successful reperfusion, percutaneous transluminal coronary percutaneous transluminal coronary & placement of a 2nd overlying stent in RCA  . ESOPHAGOGASTRODUODENOSCOPY (EGD) WITH PROPOFOL N/A 12/28/2019   Procedure: ESOPHAGOGASTRODUODENOSCOPY (EGD) WITH PROPOFOL;  Surgeon: Arta Silence, MD;  Location: [place];  Service: Endoscopy;  Laterality: N/A;  . POLYPECTOMY  12/28/2019   Procedure: POLYPECTOMY;  Surgeon: Arta Silence, MD;  Location[facility];  Service: Endoscopy;;  . RIGHT HEART CATH N/A 12/25/2019   Procedure: RIGHT HEART CATH;  Surgeon: Larey Dresser, MD;  Location: [place] [facility];  Service: Cardiovascular;  Laterality: N/A;  . RIGHT HEART CATH N/A 02/05/2020   Procedure: RIGHT HEART CATH;  Surgeon: Jolaine Artist, MD;  Location: [place] [facility];  Service: Cardiovascular;  Laterality: N/A;  . TEE WITHOUT  CARDIOVERSION N/A 12/25/2019   Procedure: TRANSESOPHAGEAL ECHOCARDIOGRAM (TEE);  Surgeon: Larey Dresser, MD;  Location: [facility] ENDOSCOPY;  Service: Cardiovascular;  Laterality: N/A;     Social History   reports that he quit smoking about 20 years ago. His smoking use included cigarettes. He has never used smokeless tobacco. He reports that he does not drink alcohol or use drugs.   Family History   His family history includes Cancer in his father; Heart Problems in his mother.   Allergies Allergies  Allergen Reactions  . Plavix [Clopidogrel Bisulfate] Anaphylaxis          Home Medications  Prior to Admission medications   Medication Sig Start Date End Date Taking? Authorizing Provider  acetaminophen (TYLENOL) 500 MG tablet Take 1,000 mg by mouth as needed for moderate pain (back pain).    Yes [provider]  amiodarone (PACERONE) 200 MG tablet Take 1 tablet (200 mg total) by mouth 2 (two) times daily. 12/30/19  Yes Mercy Riding, MD  apixaban (ELIQUIS) 5 MG TABS tablet Take 1 tablet (5 mg total) by mouth 2 (two) times daily. 01/04/20  Yes Mercy Riding, MD  aspirin EC 81 MG tablet Take 1 tablet (81 mg total) by mouth daily. 01/26/20  Yes Sherren Mocha, MD  ferrous sulfate 325 (65 FE) MG tablet Take 1 tablet (325 mg total) by mouth 2 (two) times daily with a meal. 01/08/20 07/06/20 Yes Gonfa, Charlesetta Ivory, MD  loratadine (CLARITIN) 10 MG tablet Take 10 mg by mouth daily as needed for allergies or itching.    Yes [provider]  midodrine (PROAMATINE) 5 MG tablet Take 1 tablet (5 mg total) by mouth 3 (three) times daily with meals. 12/30/19  Yes Mercy Riding, MD  pantoprazole (PROTONIX) 40 MG tablet Take 1 tablet (40 mg total) by mouth 2 (two) times daily. 12/30/19 06/27/20 Yes Mercy Riding, MD  polyethylene glycol powder (MIRALAX) 17 GM/SCOOP powder Take 17 g by mouth 2 (two) times daily as needed for moderate constipation. 12/30/19  Yes Mercy Riding, MD  potassium chloride SA  (KLOR-CON) 20 MEQ tablet Take 20 mEq by mouth daily.  Yes [provider]  pravastatin (PRAVACHOL) 40 MG tablet Take 1 tablet (40 mg total) by mouth every evening. 12/04/19  Yes Clegg, Amy D, NP  torsemide (DEMADEX) 20 MG tablet Take 4 tablets (80 mg total) by mouth daily. 01/22/20  Yes Lyda Jester M, PA-C  nitroGLYCERIN (NITROSTAT) 0.4 MG SL tablet Place 1 tablet (0.4 mg total) under the tongue every 5 (five) minutes as needed for chest pain (3 doses max). 02/19/18   Imogene Burn, PA-C     Dr. Jose Persia Internal Medicine PGY-1  Pager: [phone] 02/02/2020, 10:27 AM

## 2020-02-03 ENCOUNTER — Inpatient Hospital Stay (HOSPITAL_COMMUNITY): Payer: PPO

## 2020-02-03 DIAGNOSIS — Z8774 Personal history of (corrected) congenital malformations of heart and circulatory system: Secondary | ICD-10-CM | POA: Diagnosis not present

## 2020-02-03 DIAGNOSIS — J9601 Acute respiratory failure with hypoxia: Secondary | ICD-10-CM | POA: Diagnosis not present

## 2020-02-03 DIAGNOSIS — I5033 Acute on chronic diastolic (congestive) heart failure: Secondary | ICD-10-CM | POA: Diagnosis not present

## 2020-02-03 LAB — CBC WITH DIFFERENTIAL/PLATELET
Abs Immature Granulocytes: 0.11 10*3/uL — ABNORMAL HIGH (ref 0.00–0.07)
Basophils Absolute: 0 10*3/uL (ref 0.0–0.1)
Basophils Relative: 0 %
Eosinophils Absolute: 0.1 10*3/uL (ref 0.0–0.5)
Eosinophils Relative: 1 %
HCT: 23.1 % — ABNORMAL LOW (ref 39.0–52.0)
Hemoglobin: 7.5 g/dL — ABNORMAL LOW (ref 13.0–17.0)
Immature Granulocytes: 1 %
Lymphocytes Relative: 3 %
Lymphs Abs: 0.4 10*3/uL — ABNORMAL LOW (ref 0.7–4.0)
MCH: 29.1 pg (ref 26.0–34.0)
MCHC: 32.5 g/dL (ref 30.0–36.0)
MCV: 89.5 fL (ref 80.0–100.0)
Monocytes Absolute: 1.2 10*3/uL — ABNORMAL HIGH (ref 0.1–1.0)
Monocytes Relative: 8 %
Neutro Abs: 13.1 10*3/uL — ABNORMAL HIGH (ref 1.7–7.7)
Neutrophils Relative %: 87 %
Platelets: 77 10*3/uL — ABNORMAL LOW (ref 150–400)
RBC: 2.58 MIL/uL — ABNORMAL LOW (ref 4.22–5.81)
RDW: 20.8 % — ABNORMAL HIGH (ref 11.5–15.5)
WBC: 15 10*3/uL — ABNORMAL HIGH (ref 4.0–10.5)
nRBC: 0 % (ref 0.0–0.2)

## 2020-02-03 LAB — COOXEMETRY PANEL
Carboxyhemoglobin: 2.3 % — ABNORMAL HIGH (ref 0.5–1.5)
Carboxyhemoglobin: 2.2 % — ABNORMAL HIGH (ref 0.5–1.5)
Methemoglobin: 0.9 % (ref 0.0–1.5)
Methemoglobin: 0.8 % (ref 0.0–1.5)
O2 Saturation: 78.7 %
O2 Saturation: 61.1 %
Total hemoglobin: 7.1 g/dL — ABNORMAL LOW (ref 12.0–16.0)
Total hemoglobin: 7.9 g/dL — ABNORMAL LOW (ref 12.0–16.0)

## 2020-02-03 LAB — PATHOLOGIST SMEAR REVIEW

## 2020-02-03 LAB — MAGNESIUM: Magnesium: 1.9 mg/dL (ref 1.7–2.4)

## 2020-02-03 LAB — BASIC METABOLIC PANEL
Anion gap: 15 (ref 5–15)
BUN: 96 mg/dL — ABNORMAL HIGH (ref 8–23)
CO2: 28 mmol/L (ref 22–32)
Calcium: 8.4 mg/dL — ABNORMAL LOW (ref 8.9–10.3)
Chloride: 85 mmol/L — ABNORMAL LOW (ref 98–111)
Creatinine, Ser: 3.32 mg/dL — ABNORMAL HIGH (ref 0.61–1.24)
GFR calc Af Amer: 20 mL/min — ABNORMAL LOW (ref >60)
GFR calc non Af Amer: 17 mL/min — ABNORMAL LOW (ref >60)
Glucose, Bld: 147 mg/dL — ABNORMAL HIGH (ref 70–99)
Potassium: 3.7 mmol/L (ref 3.5–5.1)
Sodium: 128 mmol/L — ABNORMAL LOW (ref 135–145)

## 2020-02-03 MED ORDER — SODIUM CHLORIDE 0.9 % IV SOLN: INTRAVENOUS | Status: DC | PRN

## 2020-02-03 MED ORDER — METOLAZONE 2.5 MG PO TABS
2.5000 mg | ORAL_TABLET | Freq: Once | ORAL | Status: AC
  Administered 2020-02-03: 2.5 mg via ORAL
  Filled 2020-02-03: qty 1

## 2020-02-03 MED ORDER — TOLVAPTAN 15 MG PO TABS
15.0000 mg | ORAL_TABLET | Freq: Once | ORAL | Status: AC
  Administered 2020-02-03: 09:00:00 15 mg via ORAL
  Filled 2020-02-03: qty 1

## 2020-02-03 MED ORDER — NOREPINEPHRINE 16 MG/250ML-% IV SOLN
0.0000 ug/min | INTRAVENOUS | Status: DC
  Administered 2020-02-03: 18:00:00 18 ug/min via INTRAVENOUS
  Administered 2020-02-04: 17:00:00 29 ug/min via INTRAVENOUS
  Administered 2020-02-05: 36.053 ug/min via INTRAVENOUS
  Administered 2020-02-05: 32 ug/min via INTRAVENOUS
  Administered 2020-02-05: 33 ug/min via INTRAVENOUS
  Administered 2020-02-06: 100 ug/min via INTRAVENOUS
  Administered 2020-02-06: 70 ug/min via INTRAVENOUS
  Administered 2020-02-06: 22:00:00 90 ug/min via INTRAVENOUS
  Administered 2020-02-06: 100 ug/min via INTRAVENOUS
  Administered 2020-02-06: 52 ug/min via INTRAVENOUS
  Administered 2020-02-07: 85 ug/min via INTRAVENOUS
  Administered 2020-02-07: 80 ug/min via INTRAVENOUS
  Administered 2020-02-07: 88 ug/min via INTRAVENOUS
  Administered 2020-02-07: 84 ug/min via INTRAVENOUS
  Administered 2020-02-07: 82 ug/min via INTRAVENOUS
  Administered 2020-02-07 (×2): 85 ug/min via INTRAVENOUS
  Administered 2020-02-08 (×5): 100 ug/min via INTRAVENOUS
  Filled 2020-02-03 (×18): qty 250
  Filled 2020-02-03 (×2): qty 500
  Filled 2020-02-03 (×2): qty 250

## 2020-02-03 MED ORDER — DOCUSATE SODIUM 100 MG PO CAPS
100.0000 mg | ORAL_CAPSULE | Freq: Two times a day (BID) | ORAL | Status: DC
  Administered 2020-02-03 – 2020-02-04 (×3): 100 mg via ORAL
  Filled 2020-02-03 (×4): qty 1

## 2020-02-03 MED ORDER — MAGNESIUM SULFATE 2 GM/50ML IV SOLN
2.0000 g | Freq: Once | INTRAVENOUS | Status: AC
  Administered 2020-02-03: 10:00:00 2 g via INTRAVENOUS
  Filled 2020-02-03: qty 50

## 2020-02-03 MED ORDER — POTASSIUM CHLORIDE CRYS ER 20 MEQ PO TBCR
30.0000 meq | EXTENDED_RELEASE_TABLET | Freq: Once | ORAL | Status: AC
  Administered 2020-02-03: 09:00:00 30 meq via ORAL
  Filled 2020-02-03: qty 1

## 2020-02-03 MED ORDER — BISACODYL 5 MG PO TBEC
5.0000 mg | DELAYED_RELEASE_TABLET | Freq: Every day | ORAL | Status: DC | PRN
  Administered 2020-02-03: 5 mg via ORAL
  Filled 2020-02-03: qty 1

## 2020-02-03 NOTE — Progress Notes (Signed)
Patient ID: Larry [name], male   DOB: 10-01-1941, 79 y.o.   MRN: [id number]    Advanced Heart Failure Rounding Note   Subjective:    Developed acute pulmonary edema with respiratory failure and worsening renal function.  Underwent placement of a Swan-Ganz catheter and temporary dialysis catheter.  Remains on milrinone 0.125. He is on norepinephrine 16 with hypotension. Also on midodrine 15 mg tid.  SBP 80s-90s.    2.8 L UOP yesterday though recorded I/Os positive, weight is down 2 lbs.  CXR yesterday with diffuse interstitial and airspace opacity c/w pulmonary edema. He is still requiring 5L [place] and Bipap overnight.   Remains in atrial fibrillation, rate 100s-110s on amiodarone gtt 30 mg/hr.  Eliquis restarted last night, hgb mildly lower at 7.5 today.   Creatinine 3.2-> 2.9 -> 3.0 -> 3.07 -> 3.32.     Luiz Blare #s done personally CVP 14 PA 31/23  Thermo CI 4.3 Co-ox 79%  Echo (4/21): EF 60-65%, RV looked relatively normal, ASD closure device intact, dilated IVC  Objective:   Weight Range:  Vital Signs:   Temp:  [97.9 F (36.6 C)-99.5 F (37.5 C)] 97.9 F (36.6 C) (04/27 0630) Pulse Rate:  [58-135] 112 (04/27 0630) Resp:  [14-28] 22 (04/27 0630) BP: (74-134)/(44-78) 99/53 (04/27 0630) SpO2:  [85 %-99 %] 93 % (04/27 0630) FiO2 (%):  [40 %] 40 % (04/27 0415) Weight:  [84.9 kg] 84.9 kg (04/27 0500) Last BM Date: 01/23/2020  Weight change: Filed Weights   02/01/20 0630 02/02/20 0500 02/03/20 0500  Weight: 85.4 kg 86.2 kg 84.9 kg    Intake/Output:   Intake/Output Summary (Last 24 hours) at 02/03/2020 0745 Last data filed at 02/03/2020 0600 Gross per 24 hour  Intake 3811.41 ml  Output 2850 ml  Net 961.41 ml     Physical Exam: General: NAD Neck: RIJ swan and LIJ HD catheter, no thyromegaly or thyroid nodule.  Lungs: Crackles at bases. CV: Nondisplaced PMI.  Heart mildly tachy, irregular S1/S2, no S3/S4, no murmur.  2+ ankle edema. Abdomen: Soft, nontender, no  hepatosplenomegaly, no distention.  Skin: Intact without lesions or rashes.  Neurologic: Alert and oriented x 3.  Psych: Normal affect. Extremities: No clubbing or cyanosis.  HEENT: Normal.    Telemetry: AF 100s-110s Personally reviewed   Labs: Basic Metabolic Panel: Recent Labs  Lab 01/31/20 0538 01/31/20 0538 02/01/20 0232 02/01/20 0232 02/02/20 0515 02/02/20 1407 02/03/20 0525  NA 131*  --  132*  --  129* 129* 128*  K 4.8  --  3.9  --  3.1* 3.6 3.7  CL 93*  --  89*  --  85* 86* 85*  CO2 26  --  29  --  29 29 28   GLUCOSE 125*  --  121*  --  171* 138* 147*  BUN 75*  --  83*  --  87* 88* 96*  CREATININE 2.96*  --  3.04*  --  3.07* 3.09* 3.32*  CALCIUM 8.5*   < > 8.4*   < > 8.3* 8.6* 8.4*  MG  --   --   --   --  2.3  --  1.9   < > = values in this interval not displayed.    Liver Function Tests: No results for input(s): AST, ALT, ALKPHOS, BILITOT, PROT, ALBUMIN in the last 168 hours. No results for input(s): LIPASE, AMYLASE in the last 168 hours. No results for input(s): AMMONIA in the last 168 hours.  CBC: Recent Labs  Lab  01/27/20 1049 01/23/2020 1704 01/31/20 1726 02/01/20 0232 02/02/20 0515 02/02/20 1407 02/03/20 0525  WBC 12.1*   < > 15.2* 13.7* 14.0* 14.9* 15.0*  NEUTROABS 9.9*  --   --   --   --   --  13.1*  HGB 12.3*   < > 8.8* 8.6* 8.0* 8.2* 7.5*  HCT 36.6*   < > 27.6* 26.7* 24.5* 25.2* 23.1*  MCV 88   < > 94.2 94.0 92.5 90.6 89.5  PLT 185   < > 126* 120* 91* 88* 77*   < > = values in this interval not displayed.    Cardiac Enzymes: No results for input(s): CKTOTAL, CKMB, CKMBINDEX, TROPONINI in the last 168 hours.  BNP: BNP (last 3 results) Recent Labs    12/19/19 1404 01/15/20 1422 01/18/2020 1106  BNP 192.2* 607.6* 573.5*    ProBNP (last 3 results) No results for input(s): PROBNP in the last 8760 hours.    Other results:  Imaging: DG CHEST PORT 1 VIEW  Result Date: 02/02/2020 CLINICAL DATA:  Hypoxia EXAM: PORTABLE CHEST 1 VIEW  COMPARISON:  Yesterday FINDINGS: Diffuse interstitial and airspace opacity with small right pleural effusion. The airspace opacity is more confluent than before. Dialysis catheter from the left with tip at the upper SVC. Swan-Ganz catheter with tip at the right main pulmonary artery. Normal heart size. No visible pneumothorax. IMPRESSION: 1. Stable hardware positioning. 2. Generalized pulmonary opacity with interval progression. Stable small right pleural effusion. Electronically Signed   By: Monte Fantasia M.D.   On: 02/02/2020 08:59   DG CHEST PORT 1 VIEW  Result Date: 02/01/2020 CLINICAL DATA:  Status post thoracentesis. EXAM: PORTABLE CHEST 1 VIEW COMPARISON:  02/01/2020 FINDINGS: RIGHT pleural effusion has decreased. There is no evidence of pneumothorax. Diffuse bilateral airspace opacities are again identified, slightly improved in the RIGHT LOWER lung. A RIGHT IJ Swan-Ganz catheter with tip overlying the RIGHT pulmonary artery and LEFT IJ central venous catheter with tip overlying the SVC again noted. No other interval change. IMPRESSION: Decreased RIGHT pleural effusion and improved RIGHT LOWER lung aeration. No evidence of pneumothorax. Electronically Signed   By: Margarette Canada M.D.   On: 02/01/2020 18:03     Medications:     Scheduled Medications: . apixaban  5 mg Oral BID  . Chlorhexidine Gluconate Cloth  6 each Topical Daily  . mouth rinse  15 mL Mouth Rinse BID  . metolazone  2.5 mg Oral Once  . midodrine  15 mg Oral TID WC  . pantoprazole  40 mg Oral BID  . pravastatin  40 mg Oral QPM  . sodium chloride flush  10-40 mL Intracatheter Q12H  . sodium chloride flush  3 mL Intravenous Q12H  . tolvaptan  15 mg Oral Once    Infusions: . sodium chloride    . sodium chloride    . amiodarone 30 mg/hr (02/03/20 0634)  . furosemide (LASIX) infusion 12 mg/hr (02/03/20 0600)  . norepinephrine (LEVOPHED) Adult infusion 16 mcg/min (02/03/20 0600)    PRN Medications: Place/Maintain  arterial line **AND** sodium chloride, Place/Maintain arterial line **AND** sodium chloride, acetaminophen, nitroGLYCERIN, ondansetron (ZOFRAN) IV, oxyCODONE, polyethylene glycol, sodium chloride flush   Assessment.Plan:   1. Acute hypoxic respiratory failure with acute pulmonary edema and R pleural effusion: Due to pulmonary edema s/p ASD closure. Remains on 5L [place].  S/p right thoracentesis 4/25, transudative (CHF).  CXR yesterday with diffuse airspace disease. Afebrile, no evidence for infection. - Continue attempts at diuresis.  -  CXR today.  2. Large secundum ASD s/p closure: s/p successful transcatheter closure of a large secundum ASD using a 26 mm Amplatzer septal occluder deviceon 01/15/2020.  Post op limited echo showed EF 60-65% with well seated ASD closure device with no residual flow. 3. Acute on chronic diastolic CHF with prominent RV failure: Still with predominant RV failure by Luiz Blare in setting of long-standing ASD, but also pulmonary edema after ASD closure.  PADP 23 this morning, still with large oxygen requirement and CXR with pulmonary edema.  MAP marginal as well, currently on milrinone 0.125, NE 16, and midodrine 15 mg tid with excellent cardiac index and co-ox 79%.  CVP remains 14.   - I am not sure milrinone is helping [place] much and may be lowering BP and promoting afib/RVR.  Will stop milrinone now. Repeat co-ox and thermo CI afterwards.  - Wean NE as able, continue midodrine 15 mg tid. I think it may be helpful to have arterial line, place today.  - Continue Lasix 12 mg/hr, will give metolazone 2.5 x 1 and tolvaptan 15 mg x 1 with hyponatremia.   4. Acute on CKD stage IV: Creatinine baseline 2.1- 2.5.  Creatinine 3.2 -> 2.9 -> 3.0 -> 3.07 -> 3.32. Still volume overloaded with pulmonary edema.   - continue hemodynamic support - HD cath in place if needed 5. Paroxysmal aifb/flutter: developed recurrent AF on 4/24.  Eliquis stopped 4/24 due to oozing at line site with falling hgb,  restarted 4/27.  - Continue amiodarone 30 mg/hr.  - Hgb is lower but no overt bleeding other than hematuria, continue apixaban for now.  - Eventual DCCV but needs to be able to maintain Eliquis use and depending on hgb trend may have to stop again.  - Hopefully rate will improve with stopping milrinone.  6. CAD: s/p remotePCI to RCA, had stent thrombosis back in 2000.No chest pain.  - Pravastatin.  - No ASA with Eliquis use.  7. Recent GI bleed with chronic anemia: hgb 12.6 -> 9.4 -> 8.8 -> 8.6 -> 8 -> 7.5 this am.  No obvious site of bleeding except for oozing around trialysis cath that has resolved and hematuria.  - Will continue Eliquis for now.  8. ID: Afebrile,  WBC 15.2 -> 13.7 -> 14 -> 15.  - No obvious source infection. Follow.  9. Hyponatremia: Hypervolemic hyponatremia.  - Fluid restrict.  - Will give a dose of tolvaptan today.  10. Thrombocytopenia: Suspect related to acute medical illness, plts down to 77K today. No heparin.  Follow.  11. Hematuria: Had difficult foley placement.  Having some hematuria, will follow for now with Eliquis continued.  May have to stop Eliquis again.   CRITICAL CARE Performed by: [name]  Total critical care time: 40 minutes  Critical care time was exclusive of separately billable procedures and treating other patients.  Critical care was necessary to treat or prevent imminent or life-threatening deterioration.  Critical care was time spent personally by me (independent of midlevel providers or residents) on the following activities: development of treatment plan with patient and/or surrogate as well as nursing, discussions with consultants, evaluation of patient's response to treatment, examination of patient, obtaining history from patient or surrogate, ordering and performing treatments and interventions, ordering and review of laboratory studies, ordering and review of radiographic studies, pulse oximetry and re-evaluation of patient's  condition.    Length of Stay: 4   [name] MD 02/03/2020, 7:45 AM  Advanced Heart Failure Team Pager  [phone] (M-F; 7a - 4p)  Please contact [facility] for night-coverage after hours (4p -7a ) and weekends on [email or web address]

## 2020-02-03 NOTE — Procedures (Signed)
Arterial Catheter Insertion Procedure Note Larry [name] [id number] [birth year]  Procedure: Insertion of Arterial Catheter  Indications: Blood pressure monitoring  Procedure Details Consent: Risks of procedure as well as the alternatives and risks of each were explained to the (patient/caregiver).  Consent for procedure obtained. Time Out: Verified patient identification, verified procedure, site/side was marked, verified correct patient position, special equipment/implants available, medications/allergies/relevent history reviewed, required imaging and test results available.  Performed  Maximum sterile technique was used including antiseptics, cap, gloves, gown, hand hygiene, mask and sheet. Skin prep: Chlorhexidine; local anesthetic administered 20 gauge catheter was inserted into right radial artery using the Seldinger technique. ULTRASOUND GUIDANCE USED: NO Evaluation Blood flow good; BP tracing good. Complications: No apparent complications.   Larry [name] 02/03/2020

## 2020-02-03 NOTE — Progress Notes (Signed)
RT asked pt if he was ready to be placed on his cpap, pt stated he was comfortable on his [place]. RT explained to pt if he decided he wanted it throughout the night to let his nurse know, RN aware.

## 2020-02-04 ENCOUNTER — Inpatient Hospital Stay (HOSPITAL_COMMUNITY): Payer: PPO

## 2020-02-04 DIAGNOSIS — E43 Unspecified severe protein-calorie malnutrition: Secondary | ICD-10-CM | POA: Insufficient documentation

## 2020-02-04 DIAGNOSIS — Z8774 Personal history of (corrected) congenital malformations of heart and circulatory system: Secondary | ICD-10-CM | POA: Diagnosis not present

## 2020-02-04 DIAGNOSIS — J90 Pleural effusion, not elsewhere classified: Secondary | ICD-10-CM | POA: Diagnosis not present

## 2020-02-04 DIAGNOSIS — I509 Heart failure, unspecified: Secondary | ICD-10-CM

## 2020-02-04 DIAGNOSIS — J181 Lobar pneumonia, unspecified organism: Secondary | ICD-10-CM | POA: Diagnosis not present

## 2020-02-04 DIAGNOSIS — J9601 Acute respiratory failure with hypoxia: Secondary | ICD-10-CM | POA: Diagnosis not present

## 2020-02-04 DIAGNOSIS — I5033 Acute on chronic diastolic (congestive) heart failure: Secondary | ICD-10-CM | POA: Diagnosis not present

## 2020-02-04 LAB — BODY FLUID CULTURE: Culture: NO GROWTH

## 2020-02-04 LAB — BASIC METABOLIC PANEL
Anion gap: 19 — ABNORMAL HIGH (ref 5–15)
BUN: 110 mg/dL — ABNORMAL HIGH (ref 8–23)
CO2: 21 mmol/L — ABNORMAL LOW (ref 22–32)
Calcium: 8.6 mg/dL — ABNORMAL LOW (ref 8.9–10.3)
Chloride: 86 mmol/L — ABNORMAL LOW (ref 98–111)
Creatinine, Ser: 3.6 mg/dL — ABNORMAL HIGH (ref 0.61–1.24)
GFR calc Af Amer: 18 mL/min — ABNORMAL LOW (ref >60)
GFR calc non Af Amer: 15 mL/min — ABNORMAL LOW (ref >60)
Glucose, Bld: 146 mg/dL — ABNORMAL HIGH (ref 70–99)
Potassium: 4.4 mmol/L (ref 3.5–5.1)
Sodium: 126 mmol/L — ABNORMAL LOW (ref 135–145)

## 2020-02-04 LAB — RENAL FUNCTION PANEL
Albumin: 2.2 g/dL — ABNORMAL LOW (ref 3.5–5.0)
Anion gap: 15 (ref 5–15)
BUN: 92 mg/dL — ABNORMAL HIGH (ref 8–23)
CO2: 23 mmol/L (ref 22–32)
Calcium: 8.4 mg/dL — ABNORMAL LOW (ref 8.9–10.3)
Chloride: 90 mmol/L — ABNORMAL LOW (ref 98–111)
Creatinine, Ser: 3 mg/dL — ABNORMAL HIGH (ref 0.61–1.24)
GFR calc Af Amer: 22 mL/min — ABNORMAL LOW (ref >60)
GFR calc non Af Amer: 19 mL/min — ABNORMAL LOW (ref >60)
Glucose, Bld: 149 mg/dL — ABNORMAL HIGH (ref 70–99)
Phosphorus: 4.6 mg/dL (ref 2.5–4.6)
Potassium: 4.5 mmol/L (ref 3.5–5.1)
Sodium: 128 mmol/L — ABNORMAL LOW (ref 135–145)

## 2020-02-04 LAB — POCT I-STAT 7, (LYTES, BLD GAS, ICA,H+H)
Acid-Base Excess: 4 mmol/L — ABNORMAL HIGH (ref 0.0–2.0)
Bicarbonate: 29.4 mmol/L — ABNORMAL HIGH (ref 20.0–28.0)
Calcium, Ion: 1.1 mmol/L — ABNORMAL LOW (ref 1.15–1.40)
HCT: 35 % — ABNORMAL LOW (ref 39.0–52.0)
Hemoglobin: 11.9 g/dL — ABNORMAL LOW (ref 13.0–17.0)
O2 Saturation: 100 %
Patient temperature: 36.4
Potassium: 4.1 mmol/L (ref 3.5–5.1)
Sodium: 122 mmol/L — ABNORMAL LOW (ref 135–145)
TCO2: 31 mmol/L (ref 22–32)
pCO2 arterial: 43.6 mmHg (ref 32.0–48.0)
pH, Arterial: 7.434 (ref 7.350–7.450)
pO2, Arterial: 355 mmHg — ABNORMAL HIGH (ref 83.0–108.0)

## 2020-02-04 LAB — EXPECTORATED SPUTUM ASSESSMENT W GRAM STAIN, RFLX TO RESP C

## 2020-02-04 LAB — CBC WITH DIFFERENTIAL/PLATELET
Abs Immature Granulocytes: 0.23 10*3/uL — ABNORMAL HIGH (ref 0.00–0.07)
Basophils Absolute: 0 10*3/uL (ref 0.0–0.1)
Basophils Relative: 0 %
Eosinophils Absolute: 0.1 10*3/uL (ref 0.0–0.5)
Eosinophils Relative: 0 %
HCT: 23.1 % — ABNORMAL LOW (ref 39.0–52.0)
Hemoglobin: 7.9 g/dL — ABNORMAL LOW (ref 13.0–17.0)
Immature Granulocytes: 1 %
Lymphocytes Relative: 2 %
Lymphs Abs: 0.4 10*3/uL — ABNORMAL LOW (ref 0.7–4.0)
MCH: 30 pg (ref 26.0–34.0)
MCHC: 34.2 g/dL (ref 30.0–36.0)
MCV: 87.8 fL (ref 80.0–100.0)
Monocytes Absolute: 1.3 10*3/uL — ABNORMAL HIGH (ref 0.1–1.0)
Monocytes Relative: 8 %
Neutro Abs: 15.1 10*3/uL — ABNORMAL HIGH (ref 1.7–7.7)
Neutrophils Relative %: 89 %
Platelets: 87 10*3/uL — ABNORMAL LOW (ref 150–400)
RBC: 2.63 MIL/uL — ABNORMAL LOW (ref 4.22–5.81)
RDW: 20.7 % — ABNORMAL HIGH (ref 11.5–15.5)
WBC: 17.2 10*3/uL — ABNORMAL HIGH (ref 4.0–10.5)
nRBC: 0 % (ref 0.0–0.2)

## 2020-02-04 LAB — COOXEMETRY PANEL
Carboxyhemoglobin: 2.2 % — ABNORMAL HIGH (ref 0.5–1.5)
Methemoglobin: 1.1 % (ref 0.0–1.5)
O2 Saturation: 63.6 %
Total hemoglobin: 13.7 g/dL (ref 12.0–16.0)

## 2020-02-04 LAB — OCCULT BLOOD X 1 CARD TO LAB, STOOL: Fecal Occult Bld: POSITIVE — AB

## 2020-02-04 LAB — CBC
HCT: 26.3 % — ABNORMAL LOW (ref 39.0–52.0)
Hemoglobin: 8.8 g/dL — ABNORMAL LOW (ref 13.0–17.0)
MCH: 29.5 pg (ref 26.0–34.0)
MCHC: 33.5 g/dL (ref 30.0–36.0)
MCV: 88.3 fL (ref 80.0–100.0)
Platelets: 95 10*3/uL — ABNORMAL LOW (ref 150–400)
RBC: 2.98 MIL/uL — ABNORMAL LOW (ref 4.22–5.81)
RDW: 21.2 % — ABNORMAL HIGH (ref 11.5–15.5)
WBC: 21 10*3/uL — ABNORMAL HIGH (ref 4.0–10.5)
nRBC: 0.1 % (ref 0.0–0.2)

## 2020-02-04 LAB — PROCALCITONIN: Procalcitonin: 4.28 ng/mL

## 2020-02-04 MED ORDER — VASOPRESSIN 20 UNIT/ML IV SOLN
0.0400 [IU]/min | INTRAVENOUS | Status: DC
  Administered 2020-02-04 – 2020-02-06 (×3): 0.03 [IU]/min via INTRAVENOUS
  Administered 2020-02-07 – 2020-02-08 (×3): 0.04 [IU]/min via INTRAVENOUS
  Filled 2020-02-04 (×7): qty 2

## 2020-02-04 MED ORDER — PRISMASOL BGK 4/2.5 32-4-2.5 MEQ/L REPLACEMENT SOLN: Status: DC

## 2020-02-04 MED ORDER — ENSURE ENLIVE PO LIQD: 237.0000 mL | Freq: Three times a day (TID) | ORAL | Status: DC

## 2020-02-04 MED ORDER — LINEZOLID 600 MG PO TABS
600.0000 mg | ORAL_TABLET | Freq: Two times a day (BID) | ORAL | Status: DC
  Administered 2020-02-04 (×2): 600 mg via ORAL
  Filled 2020-02-04 (×2): qty 1

## 2020-02-04 MED ORDER — SODIUM CHLORIDE 0.9 % IV SOLN
2.0000 g | Freq: Two times a day (BID) | INTRAVENOUS | Status: DC
  Administered 2020-02-04 – 2020-02-06 (×6): 2 g via INTRAVENOUS
  Filled 2020-02-04 (×8): qty 2

## 2020-02-04 MED ORDER — HEPARIN SODIUM (PORCINE) 1000 UNIT/ML DIALYSIS
1000.0000 [IU] | INTRAMUSCULAR | Status: DC | PRN
  Administered 2020-02-06: 08:00:00 2800 [IU] via INTRAVENOUS_CENTRAL
  Filled 2020-02-04 (×3): qty 6

## 2020-02-04 MED ORDER — RENA-VITE PO TABS
1.0000 | ORAL_TABLET | Freq: Every day | ORAL | Status: DC
  Administered 2020-02-04 – 2020-02-06 (×3): 1 via ORAL
  Filled 2020-02-04 (×3): qty 1

## 2020-02-04 MED ORDER — PRISMASOL BGK 4/2.5 32-4-2.5 MEQ/L IV SOLN: INTRAVENOUS | Status: DC

## 2020-02-04 NOTE — Progress Notes (Signed)
PT Cancellation Note  Patient Details Name: Larry [name] MRN: [id number] DOB: 1941/10/06   Cancelled Treatment:    Reason Eval/Treat Not Completed: Medical issues which prohibited therapy; Patient getting ready to start CRRT and still needing high dose of pressors.  Will attempt again another day.   Reginia Naas 02/04/2020, 11:27 AM  Magda Kiel, [place] [phone] 02/04/2020

## 2020-02-04 NOTE — Consult Note (Addendum)
[facility] Consult Note    Assessment/ Plan:   1. AKI on CKD: Baseline creatinine around 2.0.  Persistently worsening to a height of 3.6 overnight.  This seems to be multifactorial in likely prerenal due to sepsis and diuretic use.  BUN/CR ratio indicates likely prerenal. 2/2 septic shock. Plan to stop pressers and begin CRRT. 2. Volume overload: Difficult to control volume status with aggressive diuresis until now.  We will plan to move forward with CRRT. 3. Acute hypoxic resp failure: 2/2 to volume overload and septic shock.  Continue treatment for sepsis and we will begin CRRT to begin removing volume. 4. Metabolic acidosis, anion gap 2/2 septic shock: Likely secondary to sepsis.  Continue with broad-spectrum antibiotics. 5. ASD repair 6. Hyponatremia: likely 2/2 to acute renal failure and volume overload. 7. Anemia, iron deficiency: Possibly secondary to acute blood loss secondary to surgery.  He also has a history of GI bleed.  No clear evidence of gross bleeding at this time. 8. Shock: Appears to be distributive shock secondary to sepsis.  Broad-spectrum antibiotics have been started.  Currently on Levophed, vasopressin, midodrine. 9. CKD stage IV: baseline Cr: ~2.0.   ROS: No nausea or vomiting this am.  Some confusion and mental clouding.  Subjective:   Larry [name] is a 79 year old man who has been hospitalized for repair of atrial septal defect.  He has a previous medical history significant for CKD stage IV, hypertension, A. fib, CAD.  Nephrology has been consulted due to worsening renal function and fluid overload.  He underwent repair of his stay on 4/22.  On 4/24 he was found to have significantly worsened renal function and respiratory failure.  Since that time, his progressively worsened renal failure and respiratory distress secondary to volume overload.  Despite a Lasix drip and metolazone, his volume status has not yet been adequately controlled and his kidney  function continues to worsen.  Additionally, he has worsening MIPs and a mildly depressed temperature.  There is concern for sepsis and he has been started on broad-spectrum antibiotics.  This morning, he reports that he feels mildly improved compared to the previous night.  He denies any current nausea but notes that he does feel some mental clouding/grogginess.   Objective:   BP 96/60   Pulse (!) 111   Temp (!) 97.5 F (36.4 C)   Resp (!) 22   Ht 6' (1.829 m)   Wt 85.8 kg   SpO2 93%   BMI 25.65 kg/m   Intake/Output Summary (Last 24 hours) at 02/04/2020 0933 Last data filed at 02/04/2020 0800 Gross per 24 hour  Intake 2237.05 ml  Output 740 ml  Net 1497.05 ml   Weight change: 0.9 kg  Physical Exam: General: Ill-appearing elderly man.  Resting in bed.  Nasal cannula in place.  Significant ecchymoses noted over his entire body with significant swelling noted globally.  He was alert and oriented to person and place.  He was not oriented to year believed to be 1977. Cardio: Normal S1 and S2, no S3 or S4.  Irregular rhythm.  Tachycardic.Marland Kitchen No murmurs or rubs.   Pulm: Rales noted bilaterally with lateral auscultation.  Breathing with a liters high flow nasal cannula. Abdomen: Bowel sounds normal. Abdomen soft and non-tender.  Extremities: Pitting edema noted throughout his lower extremities with some swelling noted of the hands and forearms.  Imaging: DG CHEST PORT 1 VIEW  Result Date: 02/03/2020 CLINICAL DATA:  Shortness of breath, status post atrial septal defect  closure, CHF. EXAM: PORTABLE CHEST 1 VIEW COMPARISON:  Prior chest radiographs 02/02/2020 and earlier FINDINGS: Unchanged position of a left IJ approach central venous catheter with tip projecting in the region of the SVC. Unchanged position of a right IJ approach Swan-Ganz catheter with tip projecting in the region of the right main pulmonary artery. Overlying cardiac monitoring leads. Heart size at the upper limits of normal,  unchanged. Aortic atherosclerosis. Redemonstrated diffuse interstitial and ill-defined opacity throughout both lungs. Persistent small right and trace left pleural effusions. No evidence of pneumothorax. No acute bony abnormality. IMPRESSION: Unchanged support apparatus positioning. Similar appearance of diffuse interstitial and ill-defined airspace opacity throughout both lungs, which may reflect pulmonary edema and/or pneumonia. Persistent small right and trace left pleural effusions. Aortic Atherosclerosis (ICD10-I70.0). Electronically Signed   By: Kellie Simmering DO   On: 02/03/2020 09:24    Labs: BMET Recent Labs  Lab 01/20/2020 1106 01/23/2020 2000 01/26/2020 2009 01/31/2020 2009 01/31/20 3500 02/01/20 0232 02/02/20 0515 02/02/20 1407 02/03/20 0525 02/04/20 0125 02/04/20 0257  NA 128*   < > 129*   < > 131* 132* 129* 129* 128* 122* 126*  K 4.3   < > 4.8   < > 4.8 3.9 3.1* 3.6 3.7 4.1 4.4  CL 91*  --  90*  --  93* 89* 85* 86* 85*  --  86*  CO2 25  --   --   --  26 29 29 29 28   --  21*  GLUCOSE 149*  --  105*  --  125* 121* 171* 138* 147*  --  146*  BUN 69*  --  62*  --  75* 83* 87* 88* 96*  --  110*  CREATININE 2.85*  --  3.20*  --  2.96* 3.04* 3.07* 3.09* 3.32*  --  3.60*  CALCIUM 8.7*  --   --   --  8.5* 8.4* 8.3* 8.6* 8.4*  --  8.6*   < > = values in this interval not displayed.   CBC Recent Labs  Lab 02/02/20 0515 02/02/20 0515 02/02/20 1407 02/03/20 0525 02/04/20 0125 02/04/20 0257  WBC 14.0*  --  14.9* 15.0*  --  17.2*  NEUTROABS  --   --   --  13.1*  --  15.1*  HGB 8.0*   < > 8.2* 7.5* 11.9* 7.9*  HCT 24.5*   < > 25.2* 23.1* 35.0* 23.1*  MCV 92.5  --  90.6 89.5  --  87.8  PLT 91*  --  88* 77*  --  87*   < > = values in this interval not displayed.    Medications:    . apixaban  5 mg Oral BID  . Chlorhexidine Gluconate Cloth  6 each Topical Daily  . docusate sodium  100 mg Oral BID  . linezolid  600 mg Oral Q12H  . mouth rinse  15 mL Mouth Rinse BID  . midodrine   15 mg Oral TID WC  . pantoprazole  40 mg Oral BID  . pravastatin  40 mg Oral QPM  . sodium chloride flush  10-40 mL Intracatheter Q12H  . sodium chloride flush  3 mL Intravenous Q12H     Matilde Haymaker, MD [place] Resident, PGY2 02/04/2020, 9:33 AM

## 2020-02-04 NOTE — Progress Notes (Addendum)
Initial Nutrition Assessment  DOCUMENTATION CODES:   Severe malnutrition in context of chronic illness  INTERVENTION:   Ensure Enlive po TID between meals, each supplement provides 350 kcal and 20 grams of protein  Magic cup TID with meals, each supplement provides 290 kcal and 9 grams of protein  Vital Cuisine Shake TID with meals, each supplement provides 520 kcal and 22 grams of protein  Recoormmend liberalizing diet to promote po intake  If po intake remains inadequate, recommend insertion of Cortrak tube with initiation of enteral nutrition. This was discussed with pt, his family and MD. Possible Cortrak on Friday if no improvement in po intake  Add Renal MVI with initiation of CRRT to replace losses   NUTRITION DIAGNOSIS:   Severe Malnutrition related to chronic illness as evidenced by severe muscle depletion, severe fat depletion, edema.  GOAL:   Patient will meet greater than or equal to 90% of their needs  MONITOR:   PO intake, Supplement acceptance, Labs, Weight trends  REASON FOR ASSESSMENT:   Rounds    ASSESSMENT:   79 yo male admitted for closure of 3cm ASD, developed acute respiratory failure with pulmonary edema and AKI on CKD IV requiring CRRT. PMH includes CKD IV, HTN, MI, CAD   4/22 ASD closure 4/24 Worsening renal and respiraatory function, R. Heart cath with Swan-Ganz placement, HD cath placement 4/25 R. Thoracentesis with 1.7 L removed 4/28 CRRT initiated  Plan to initiate CRRT today. Pt requiring levophed and vasopressin gtt  Pt appears very weak on exam, pt does not participate much in assessment today. Pt's wife and pt's daughter at bedside.   Pt reports appetite is very poor. Recorded po intake 0-25% of meals. Per RN, pt has not really eaten any of this meal trays in the last few days. Pt has also been on and off BiPap which has impeded po intake.   Pt ate no breakfast this AM and did not order a lunch tray. Pt did drink 1 [employer] "Core  Power" shake that family has been brining in from home. Each shake contains 130 calories and 24 g of protein. Family reporting they have been able to get 2-3 of these shakes in him per day  Discussed with pt and family the importance of improving nutrition given malnutrition present on exam, inititaiton of CRRT, acute illness. While the [employer] shakes are a great source of protein, they are low in calories. 3 per day provides less than 400 kcals/day. Discussed options for adding some calorically dense oral nutrition supplements to improve po intake and they are open to trying all of these. RD to order [employer], Ensure [employer] and [employer] Vital Cuisine shakes.   RD also discussed possibility of temporary Cortrak tube if po nutrition does not improve. Pt and family are hesitant and hoping to avoid temporary feeding tube by increasing po intake, but also understand the importance of improving nutritional status and will consider tube placement if needed.   SLP has been consulted as well due to concern for dysphagia  Current weight 85.8 kg (188.8 pounds); reports UBW around 185-190 pounds. Unsure of actual dry weight given significant edema on exam.   Wife reports pt with decreased physical function at home, feeling weak, not getting around as well as he used.   Labs: sodium 126 (L), Creatinine 3.60, BUN 110 Meds: reviewed   NUTRITION - FOCUSED PHYSICAL EXAM:     Most Recent Value  Orbital Region  Severe depletion  Upper Arm Region  Unable to assess [edema]  Thoracic and Lumbar Region  Unable to assess  Buccal Region  Severe depletion  Temple Region  Severe depletion  Clavicle Bone Region  Severe depletion  Clavicle and Acromion Bone Region  Severe depletion  Scapular Bone Region  Severe depletion  Dorsal Hand  Moderate depletion  Patellar Region  Unable to assess  Anterior Thigh Region  Unable to assess  Posterior Calf Region  Unable to assess       Diet Order:  HEART HEALTHY with  1500 mL fluid restriction  EDUCATION NEEDS:   Education needs have been addressed  Skin:  Skin Assessment: Skin Integrity Issues: Skin Integrity Issues:: DTI DTI: foot  Last BM:  4/28  Height:   Ht Readings from Last 1 Encounters:  02/03/20 6' (1.829 m)    Weight:   Wt Readings from Last 1 Encounters:  02/04/20 85.8 kg    BMI:  Body mass index is 25.65 kg/m.  Estimated Nutritional Needs:   Kcal:  2100-2400 kcals  Protein:  120-140 g  Fluid:  1.5 L   Kerman Passey MS, RDN, LDN, CNSC RD Pager Number and RD On-Call Pager Number Located in [place]

## 2020-02-04 NOTE — Progress Notes (Signed)
eLink Physician-Brief Progress Note Patient Name: Larry [name] DOB: [birth year] MRN: [id number]   Date of Service  02/04/2020  HPI/Events of Note  Pt took his oxygen cannula off then desaturated to the 50's and became somnolent, blood pressure also transiently dipped, both saturation and  Blood pressure have recovered on BIPAP.   eICU Interventions  ABG in 15 minutes.        Kerry Kass Tyronne Blann 02/04/2020, 12:49 AM

## 2020-02-04 NOTE — Progress Notes (Signed)
Pt's MAP's started to go into 50's again on 7mcg of Levo. Increased Levo to 42mcg while calling Cards Fellow Dr. Paticia Stack and received orders to increase parameters from original 0-23mcg to 0-83mcg. Will continue to monitor closely.

## 2020-02-04 NOTE — Progress Notes (Addendum)
Pt started coughing and took off his nasal cannula then he immediately desaturated into 50's and his MAPs went into 50's. I put nonrebreather on 15L and went up on Levo from 39mcg to 43mcg (while calling E-link) and pt started to became unresponsive. E-link notified and while talking to MD pt's oxygen saturations went back up to 80's and MAPs went up to 80's. MD ordered BIPAP and follow up ABG. Within 5 minutes of being on BIPAP pt's oxygen saturations went up to 100. Will continue to monitor closely.

## 2020-02-04 NOTE — Progress Notes (Signed)
Chaplain engaged in initial visit with Larry [name] and his wife and daughter.  Chaplain offered support and explained the spiritual care services she offers.  Chaplain will continue to follow-up.

## 2020-02-04 NOTE — Progress Notes (Signed)
Patient ID: Larry [name], male   DOB: [birth year], 79 y.o.   MRN: [id number]    Advanced Heart Failure Rounding Note   Subjective:    Developed acute pulmonary edema with respiratory failure and worsening renal function.  Underwent placement of a Swan-Ganz catheter and temporary dialysis catheter.  He remains on norepinephrine 25 with hypotension. Also on midodrine 15 mg tid.  SBP now 90s-100s.   Only 740 cc UOP yesterday with Lasix 12 mg/hr + tolvaptan.  CXR still with diffuse interstitial and airspace opacity c/w pulmonary edema. Desaturation overnight and on Bipap this morning.    Remains in atrial fibrillation, rate 100s on amiodarone gtt 30 mg/hr.  Hgb 7.9 on Eliquis, urine appears to be clearing.   Creatinine 3.2-> 2.9 -> 3.0 -> 3.07 -> 3.32 -> 3.6 with BUN 110.     Swan #s done personally CVP 18 PA 40/34 Thermo CI 3.4 Co-ox 64% SVR 478  Echo (4/21): EF 60-65%, RV looked relatively normal, ASD closure device intact, dilated IVC  Objective:   Weight Range:  Vital Signs:   Temp:  [96.3 F (35.7 C)-98.3 F (36.8 C)] 97.7 F (36.5 C) (04/28 0700) Pulse Rate:  [92-121] 111 (04/28 0700) Resp:  [14-28] 24 (04/28 0700) BP: (76-122)/(38-96) 101/65 (04/28 0700) SpO2:  [83 %-100 %] 98 % (04/28 0700) Arterial Line BP: (70-127)/(27-64) 93/55 (04/28 0700) Weight:  [85.8 kg] 85.8 kg (04/28 0500) Last BM Date: 01/26/2020  Weight change: Filed Weights   02/02/20 0500 02/03/20 0500 02/04/20 0500  Weight: 86.2 kg 84.9 kg 85.8 kg    Intake/Output:   Intake/Output Summary (Last 24 hours) at 02/04/2020 0749 Last data filed at 02/04/2020 0600 Gross per 24 hour  Intake 2510.73 ml  Output 740 ml  Net 1770.73 ml     Physical Exam: General: NAD Neck: JVP 14-16, no thyromegaly or thyroid nodule.  Lungs: Crackles bilateral bases.  CV: Nondisplaced PMI.  Heart mildly tachy, irregular S1/S2, no S3/S4, no murmur.  1+ edema to knees.   Abdomen: Soft, nontender, no hepatosplenomegaly,  no distention.  Skin: Intact without lesions or rashes.  Neurologic: Alert and oriented x 3.  Psych: Normal affect. Extremities: No clubbing or cyanosis.  HEENT: Normal.    Telemetry: AF 100s Personally reviewed   Labs: Basic Metabolic Panel: Recent Labs  Lab 02/01/20 0232 02/01/20 0232 02/02/20 0515 02/02/20 0515 02/02/20 1407 02/03/20 0525 02/04/20 0125 02/04/20 0257  NA 132*   < > 129*  --  129* 128* 122* 126*  K 3.9   < > 3.1*  --  3.6 3.7 4.1 4.4  CL 89*  --  85*  --  86* 85*  --  86*  CO2 29  --  29  --  29 28  --  21*  GLUCOSE 121*  --  171*  --  138* 147*  --  146*  BUN 83*  --  87*  --  88* 96*  --  110*  CREATININE 3.04*  --  3.07*  --  3.09* 3.32*  --  3.60*  CALCIUM 8.4*   < > 8.3*   < > 8.6* 8.4*  --  8.6*  MG  --   --  2.3  --   --  1.9  --   --    < > = values in this interval not displayed.    Liver Function Tests: No results for input(s): AST, ALT, ALKPHOS, BILITOT, PROT, ALBUMIN in the last 168 hours. No results for input(s):  LIPASE, AMYLASE in the last 168 hours. No results for input(s): AMMONIA in the last 168 hours.  CBC: Recent Labs  Lab 02/01/20 0232 02/01/20 0232 02/02/20 0515 02/02/20 1407 02/03/20 0525 02/04/20 0125 02/04/20 0257  WBC 13.7*  --  14.0* 14.9* 15.0*  --  17.2*  NEUTROABS  --   --   --   --  13.1*  --  15.1*  HGB 8.6*   < > 8.0* 8.2* 7.5* 11.9* 7.9*  HCT 26.7*   < > 24.5* 25.2* 23.1* 35.0* 23.1*  MCV 94.0  --  92.5 90.6 89.5  --  87.8  PLT 120*  --  91* 88* 77*  --  87*   < > = values in this interval not displayed.    Cardiac Enzymes: No results for input(s): CKTOTAL, CKMB, CKMBINDEX, TROPONINI in the last 168 hours.  BNP: BNP (last 3 results) Recent Labs    12/19/19 1404 01/15/20 1422 02/02/2020 1106  BNP 192.2* 607.6* 573.5*    ProBNP (last 3 results) No results for input(s): PROBNP in the last 8760 hours.    Other results:  Imaging: DG CHEST PORT 1 VIEW  Result Date: 02/03/2020 CLINICAL DATA:   Shortness of breath, status post atrial septal defect closure, CHF. EXAM: PORTABLE CHEST 1 VIEW COMPARISON:  Prior chest radiographs 02/02/2020 and earlier FINDINGS: Unchanged position of a left IJ approach central venous catheter with tip projecting in the region of the SVC. Unchanged position of a right IJ approach Swan-Ganz catheter with tip projecting in the region of the right main pulmonary artery. Overlying cardiac monitoring leads. Heart size at the upper limits of normal, unchanged. Aortic atherosclerosis. Redemonstrated diffuse interstitial and ill-defined opacity throughout both lungs. Persistent small right and trace left pleural effusions. No evidence of pneumothorax. No acute bony abnormality. IMPRESSION: Unchanged support apparatus positioning. Similar appearance of diffuse interstitial and ill-defined airspace opacity throughout both lungs, which may reflect pulmonary edema and/or pneumonia. Persistent small right and trace left pleural effusions. Aortic Atherosclerosis (ICD10-I70.0). Electronically Signed   By: Kellie Simmering DO   On: 02/03/2020 09:24   DG CHEST PORT 1 VIEW  Result Date: 02/02/2020 CLINICAL DATA:  Hypoxia EXAM: PORTABLE CHEST 1 VIEW COMPARISON:  Yesterday FINDINGS: Diffuse interstitial and airspace opacity with small right pleural effusion. The airspace opacity is more confluent than before. Dialysis catheter from the left with tip at the upper SVC. Swan-Ganz catheter with tip at the right main pulmonary artery. Normal heart size. No visible pneumothorax. IMPRESSION: 1. Stable hardware positioning. 2. Generalized pulmonary opacity with interval progression. Stable small right pleural effusion. Electronically Signed   By: Monte Fantasia M.D.   On: 02/02/2020 08:59     Medications:     Scheduled Medications: . apixaban  5 mg Oral BID  . Chlorhexidine Gluconate Cloth  6 each Topical Daily  . docusate sodium  100 mg Oral BID  . mouth rinse  15 mL Mouth Rinse BID  .  midodrine  15 mg Oral TID WC  . pantoprazole  40 mg Oral BID  . pravastatin  40 mg Oral QPM  . sodium chloride flush  10-40 mL Intracatheter Q12H  . sodium chloride flush  3 mL Intravenous Q12H    Infusions: . sodium chloride    . sodium chloride    . amiodarone 30 mg/hr (02/04/20 0400)  . furosemide (LASIX) infusion 12 mg/hr (02/04/20 0400)  . norepinephrine (LEVOPHED) Adult infusion 25 mcg/min (02/04/20 0400)  . vasopressin (PITRESSIN) infusion - *  FOR SHOCK*      PRN Medications: Place/Maintain arterial line **AND** sodium chloride, Place/Maintain arterial line **AND** sodium chloride, acetaminophen, bisacodyl, nitroGLYCERIN, ondansetron (ZOFRAN) IV, oxyCODONE, polyethylene glycol, sodium chloride flush   Assessment.Plan:   1. Acute hypoxic respiratory failure with acute pulmonary edema and R pleural effusion: Due to pulmonary edema s/p ASD closure. Remains on Bipap.  S/p right thoracentesis 4/25, transudative (CHF).  CXR yesterday with diffuse airspace disease. Low SVR from Swan, WBCs up to 17 => ?component of PNA.  Diuresis not succeeding with IV Lasix.  - Will need to transition to CVVH for volume removal. - Send procalcitonin.  - Will cover empirically for PNA after culturing (vancomycin/cefepime).    - CXR today.  2. Shock: SVR is low, cardiac output is preserved (CI 3.4 off Swan).  He is requiring NE 25.  This appears to be primarily vasodilatory shock, ?septic shock though no fever.  WBCs up to 17.  - As above, will cover empirically with abx and send procalcitonin.  - Continue NE, can add vasopressin if needed for MAP maintenance while on CVVH.  3. Large secundum ASD s/p closure: s/p successful transcatheter closure of a large secundum ASD using a 26 mm Amplatzer septal occluder deviceon 01/18/2020.  Post op limited echo showed EF 60-65% with well seated ASD closure device with no residual flow. 4. Acute on chronic diastolic CHF with prominent RV failure: Still with predominant  RV failure by Luiz Blare in setting of long-standing ASD, but also pulmonary edema after ASD closure.  PADP 34 this morning, still with large oxygen requirement and CXR with diffuse airspace disease/pulmonary edema yesterday am.  MAP marginal as well, currently on NE 25 and midodrine 15 mg tid with excellent cardiac index and co-ox 64%.  CVP remains 18.   - Renal function worsening and unable to diurese effectively => I think he will require CVVH to try to clear his lungs, will consult renal to start.  - Stop Lasix after CVVH begun.  4. Acute on CKD stage IV: Creatinine baseline 2.1- 2.5.  Creatinine 3.2 -> 2.9 -> 3.0 -> 3.07 -> 3.32 -> 3.6. Still volume overloaded with pulmonary edema and large oxygen requirement.   - continue hemodynamic support - Consult renal for CVVH today.  5. Paroxysmal aifb/flutter: developed recurrent AF on 4/24.  Eliquis stopped 4/24 due to oozing at line site with falling hgb, restarted 4/27.  - Continue amiodarone 30 mg/hr.  - Continue apixaban.   - Eventual DCCV but needs to be able to maintain Eliquis use and depending on hgb trend may have to stop again.  6. CAD: s/p remotePCI to RCA, had stent thrombosis back in 2000.No chest pain.  - Pravastatin.  - No ASA with Eliquis use.  7. Recent GI bleed with chronic anemia: hgb 12.6 -> 9.4 -> 8.8 -> 8.6 -> 8 -> 7.5 -> 7.9 this am.  No obvious site of bleeding except for oozing around trialysis cath that has resolved and hematuria which appears to be resolving.  - Will continue Eliquis for now.  8. ID: Afebrile,  WBC 15.2 -> 13.7 -> 14 -> 15 -> 17.  SVR is low suggesting vasodilatory shock, ?septic. CXR cannot rule out component of PNA along with pulmonary edema.  - Procalcitonin.  - Culture blood, sputum, urine.  - Empiric cefepime/vancomycin.  9. Hyponatremia: Hypervolemic hyponatremia.  - Fluid restrict.  -10. Thrombocytopenia: Suspect related to acute medical illness, plts higher at 87K today. No heparin.  Follow.  11.  Hematuria: Had difficult foley placement.  Having some hematuria, will follow for now with Eliquis continued.  This seems to be improving.  CRITICAL CARE Performed by: [name]  Total critical care time: 40 minutes  Critical care time was exclusive of separately billable procedures and treating other patients.  Critical care was necessary to treat or prevent imminent or life-threatening deterioration.  Critical care was time spent personally by me (independent of midlevel providers or residents) on the following activities: development of treatment plan with patient and/or surrogate as well as nursing, discussions with consultants, evaluation of patient's response to treatment, examination of patient, obtaining history from patient or surrogate, ordering and performing treatments and interventions, ordering and review of laboratory studies, ordering and review of radiographic studies, pulse oximetry and re-evaluation of patient's condition.    Length of Stay: 5   [name] MD 02/04/2020, 7:49 AM  Advanced Heart Failure Team Pager [phone] (M-F; 7a - 4p)  Please contact [facility] for night-coverage after hours (4p -7a ) and weekends on [email or web address]

## 2020-02-04 NOTE — Progress Notes (Signed)
NAME:  Alamin Mccuiston, MRN:  [id number], DOB:  [birth year], LOS: 5 ADMISSION DATE:  01/09/2020, CONSULTATION DATE:  4/25 REFERRING MD:  Bensimhon, CHIEF COMPLAINT:  Pleural effusion   Brief History   Chronic right heart failure due to ASD S/p ASD closure, now with acute respiratory failure due to acute pulmonary edema. Requiring BiPAP.  History of present illness   Mr. Suleiman is a 79 y/o gentleman admitted for closure of a 3cm ASD diagnosed during evaluation of right heart failure. He underwent ASD closure on 4/22.  He went into acute respiratory failure with worsening renal function on 4/24.  He had Swan-Ganz catheter, dialysis catheter placed to guide treatment.  He has had good response to diuresis.  He was able to come off BiPAP 4/23, but today required BiPAP again on 4/25. On BiPAP SOB was controlled and improved after thoracentesis on 4/25 yielding 1.7L of transudative fluid. PCCM signed off 4/26 and re-consulted 4/28 due to continued BiPAP requirements and concern for possible sepsis given CXR showing Edema vs Airspace disease with increased WBC and elevated Procalcitonin.  Past Medical History  CKD ASD CAD HLD HTN dHF w [facility]   4/22 ASD closure 4/24 Right heart cath with Swan-Ganz placement, dialysis catheter placement 4/25 R Thoracentesis (-1/7L Transudate)  Consults:  PCCM Cardiology  Procedures:  4/22 ASD closure 4/24 Right heart cath with Swan-Ganz placement, dialysis catheter placement 4/25 R Thoracentesis (-1/7L Transudate)  Significant Diagnostic Tests:  CXR (4/25): Decreased RIGHT pleural effusion and improved RIGHT LOWER lung aeration. No evidence of pneumothorax.  CXR (4/26): Generalized pulmonary opacity with interval progression. Stable small right pleural effusion.  CXR (4/28): Stable bilateral lung opacities are noted consistent with pulmonary edema or pneumonia. Stable mild right pleural effusion is noted.  Micro Data:  4/26  Pleural fluid: > WBC cells, no organisms, culture NG 4/28 Blood Cx: Pending 4/28 Urine Cx: Pending 4/28 Sputum Cx: Pending  Antimicrobials:  Cefazolin 4/21 Linezolid 4/28 >> Cefepime 4/28 >>  Interim history/subjective:   Took [place] off overnight and desaturated in to the 50s with somnolence and transient BP drop. Improved back on BiPAP.   Objective   Blood pressure 99/72, pulse 99, temperature (!) 93.9 F (34.4 C), resp. rate 17, height 6' (1.829 m), weight 85.8 kg, SpO2 95 %. PAP: (25-60)/(19-53) 50/24 CVP:  [13 mmHg-23 mmHg] 16 mmHg PCWP:  [34 mmHg] 34 mmHg CO:  [7 L/min-7.7 L/min] 7.1 L/min CI:  [3.4 L/min/m2-3.5 L/min/m2] 3.4 L/min/m2  FiO2 (%):  [40 %] 40 %   Intake/Output Summary (Last 24 hours) at 02/04/2020 1557 Last data filed at 02/04/2020 1500 Gross per 24 hour  Intake 2093.21 ml  Output 957 ml  Net 1136.21 ml   Filed Weights   02/02/20 0500 02/03/20 0500 02/04/20 0500  Weight: 86.2 kg 84.9 kg 85.8 kg    Examination: General: Ill-appearing elderly man, on BiPAP with mild increased WOB Lungs: Bilateral Rales to mid back. No wheezing. No accessory muscle use. Absent Breath Sounds at L Base Extremities: Bilateral 2+ pitting edema to hip Neuro: Alert, responds appropriately to questions  Resolved Hospital Problem list     Assessment & Plan:  ?Pneumonia Acute hypoxic respiratory failure Pulmonary edema Right-sided pleural effusion - Thoracentesis 4/25 yielding 1.7L of transudative fluid. Negative Fluid Cx. Some Re-accumilation - Suspect a degree of pulmonary edema and possible pneumonia with worsening WBC and Procalcitonin >4 - Amiodarone induced lung injury was previously discussed, ESR 35  - Now on CRRT (  pulling 50-75cc/hr) - Requiring increasing Levophed and Vassopressin P: - Continue BiPAP PRN and qHS - Wean oxygen if Able - Continue fluid removal with CRRT - Continue Linezolid and Cefepime - Follow fever curve, WBC, Procalcitonin  - May benefit from  thoracentesis if respiratory status fails to improve  Afib, CAD, ASD s/p closure, A/C dHF with RHF - Per Heart Failure  Hyponatremia AGMA 2/2 Uremia A/C Renal disease: Now on CRRT - Per Nephrology  Best practice:  Per primary Disposition: ICU  Labs   CBC: Recent Labs  Lab 02/01/20 0232 02/01/20 0232 02/02/20 0515 02/02/20 1407 02/03/20 0525 02/04/20 0125 02/04/20 0257  WBC 13.7*  --  14.0* 14.9* 15.0*  --  17.2*  NEUTROABS  --   --   --   --  13.1*  --  15.1*  HGB 8.6*   < > 8.0* 8.2* 7.5* 11.9* 7.9*  HCT 26.7*   < > 24.5* 25.2* 23.1* 35.0* 23.1*  MCV 94.0  --  92.5 90.6 89.5  --  87.8  PLT 120*  --  91* 88* 77*  --  87*   < > = values in this interval not displayed.    Basic Metabolic Panel: Recent Labs  Lab 02/01/20 0232 02/01/20 0232 02/02/20 0515 02/02/20 1407 02/03/20 0525 02/04/20 0125 02/04/20 0257  NA 132*   < > 129* 129* 128* 122* 126*  K 3.9   < > 3.1* 3.6 3.7 4.1 4.4  CL 89*  --  85* 86* 85*  --  86*  CO2 29  --  '29 29 28  ' --  21*  GLUCOSE 121*  --  171* 138* 147*  --  146*  BUN 83*  --  87* 88* 96*  --  110*  CREATININE 3.04*  --  3.07* 3.09* 3.32*  --  3.60*  CALCIUM 8.4*  --  8.3* 8.6* 8.4*  --  8.6*  MG  --   --  2.3  --  1.9  --   --    < > = values in this interval not displayed.   GFR: Estimated Creatinine Clearance: 18.9 mL/min (A) (by C-G formula based on SCr of 3.6 mg/dL (H)). Recent Labs  Lab 02/02/20 0515 02/02/20 1407 02/03/20 0525 02/04/20 0257 02/04/20 0759  PROCALCITON  --   --   --   --  4.28  WBC 14.0* 14.9* 15.0* 17.2*  --     Liver Function Tests: No results for input(s): AST, ALT, ALKPHOS, BILITOT, PROT, ALBUMIN in the last 168 hours. No results for input(s): LIPASE, AMYLASE in the last 168 hours. No results for input(s): AMMONIA in the last 168 hours.  ABG    Component Value Date/Time   PHART 7.434 02/04/2020 0125   PCO2ART 43.6 02/04/2020 0125   PO2ART 355 (H) 02/04/2020 0125   HCO3 29.4 (H) 02/04/2020  0125   TCO2 31 02/04/2020 0125   ACIDBASEDEF 2.0 12/25/2019 0845   O2SAT 63.6 02/04/2020 0316     Coagulation Profile: No results for input(s): INR, PROTIME in the last 168 hours.  Cardiac Enzymes: No results for input(s): CKTOTAL, CKMB, CKMBINDEX, TROPONINI in the last 168 hours.  HbA1C: No results found for: HGBA1C  CBG: No results for input(s): GLUCAP in the last 168 hours.  Review of Systems:   +SOB, otherwise negative  Past Medical History  He,  has a past medical history of Coronary artery disease, Hypercholesterolemia, Hypertension, Myocardial infarct ([place]), Olecranon bursitis, and S/P atrial septal defect closure.  Surgical History    Past Surgical History:  Procedure Laterality Date  . ATRIAL SEPTAL DEFECT(ASD) CLOSURE N/A 01/18/2020   Procedure: ATRIAL SEPTAL DEFECT (ASD) CLOSURE;  Surgeon: Sherren Mocha, MD;  Location: [facility];  Service: Cardiovascular;  Laterality: N/A;  . CARDIAC CATHETERIZATION  12/15/1999   EF was 55%   . COLONOSCOPY WITH PROPOFOL N/A 12/28/2019   Procedure: COLONOSCOPY WITH PROPOFOL;  Surgeon: Arta Silence, MD;  Location: [facility];  Service: Endoscopy;  Laterality: N/A;  . CORONARY ANGIOPLASTY WITH STENT PLACEMENT  08/15/1999   CAD, status post prior stenting of the mid to distal RCA in 11/1998, with an acute diaphragmatic wall infarction due to total occlusion at the stent site/There is also 70% narrowing in the diagonal branch of the LAD/The LV showed inferior wall hypo- akinesis.-- Successful reperfusion, percutaneous transluminal coronary percutaneous transluminal coronary & placement of a 2nd overlying stent in RCA  . ESOPHAGOGASTRODUODENOSCOPY (EGD) WITH PROPOFOL N/A 12/28/2019   Procedure: ESOPHAGOGASTRODUODENOSCOPY (EGD) WITH PROPOFOL;  Surgeon: Arta Silence, MD;  Location: [facility];  Service: Endoscopy;  Laterality: N/A;  . POLYPECTOMY  12/28/2019   Procedure: POLYPECTOMY;  Surgeon: Arta Silence, MD;   Location[facility];  Service: Endoscopy;;  . RIGHT HEART CATH N/A 12/25/2019   Procedure: RIGHT HEART CATH;  Surgeon: Larey Dresser, MD;  Location: [facility];  Service: Cardiovascular;  Laterality: N/A;  . RIGHT HEART CATH N/A 01/15/2020   Procedure: RIGHT HEART CATH;  Surgeon: Jolaine Artist, MD;  Location: [facility];  Service: Cardiovascular;  Laterality: N/A;  . TEE WITHOUT CARDIOVERSION N/A 12/25/2019   Procedure: TRANSESOPHAGEAL ECHOCARDIOGRAM (TEE);  Surgeon: Larey Dresser, MD;  Location: [facility] ENDOSCOPY;  Service: Cardiovascular;  Laterality: N/A;     Social History   reports that he quit smoking about 20 years ago. His smoking use included cigarettes. He has never used smokeless tobacco. He reports that he does not drink alcohol or use drugs.   Family History   His family history includes Cancer in his father; Heart Problems in his mother.   Allergies Allergies  Allergen Reactions  . Plavix [Clopidogrel Bisulfate] Anaphylaxis          Home Medications  Prior to Admission medications   Medication Sig Start Date End Date Taking? Authorizing Provider  acetaminophen (TYLENOL) 500 MG tablet Take 1,000 mg by mouth as needed for moderate pain (back pain).    Yes [provider]  amiodarone (PACERONE) 200 MG tablet Take 1 tablet (200 mg total) by mouth 2 (two) times daily. 12/30/19  Yes Mercy Riding, MD  apixaban (ELIQUIS) 5 MG TABS tablet Take 1 tablet (5 mg total) by mouth 2 (two) times daily. 01/04/20  Yes Mercy Riding, MD  aspirin EC 81 MG tablet Take 1 tablet (81 mg total) by mouth daily. 01/26/20  Yes Sherren Mocha, MD  ferrous sulfate 325 (65 FE) MG tablet Take 1 tablet (325 mg total) by mouth 2 (two) times daily with a meal. 01/08/20 07/06/20 Yes Gonfa, Charlesetta Ivory, MD  loratadine (CLARITIN) 10 MG tablet Take 10 mg by mouth daily as needed for allergies or itching.    Yes [provider]  midodrine (PROAMATINE) 5 MG tablet Take 1 tablet (5  mg total) by mouth 3 (three) times daily with meals. 12/30/19  Yes Mercy Riding, MD  pantoprazole (PROTONIX) 40 MG tablet Take 1 tablet (40 mg total) by mouth 2 (two) times daily. 12/30/19 06/27/20 Yes Gonfa,  Charlesetta Ivory, MD  polyethylene glycol powder (MIRALAX) 17 GM/SCOOP powder Take 17 g by mouth 2 (two) times daily as needed for moderate constipation. 12/30/19  Yes Mercy Riding, MD  potassium chloride SA (KLOR-CON) 20 MEQ tablet Take 20 mEq by mouth daily.   Yes [provider]  pravastatin (PRAVACHOL) 40 MG tablet Take 1 tablet (40 mg total) by mouth every evening. 12/04/19  Yes Clegg, Amy D, NP  torsemide (DEMADEX) 20 MG tablet Take 4 tablets (80 mg total) by mouth daily. 01/22/20  Yes Lyda Jester M, PA-C  nitroGLYCERIN (NITROSTAT) 0.4 MG SL tablet Place 1 tablet (0.4 mg total) under the tongue every 5 (five) minutes as needed for chest pain (3 doses max). 02/19/18   Imogene Burn, PA-C   Pearson Grippe, DO IM PGY-3

## 2020-02-04 NOTE — Progress Notes (Signed)
Pharmacy Antibiotic Note  Larry [name] is a 79 y.o. male admitted on 01/26/2020 for ASD closure. He went into acute respiratory failure with worsening renal function on 4/24. Now with low SVR and preserved cardiac output concerning for septic vs vasodilatory shock. Pharmacy has been consulted for cefepime and linezolid dosing. Note pt to start CRRT today.  Plan: Cefepime 2 g IV q12h Linezolid 600 mg BID F/u cultures, CRRT duration, LOT, clinical status  Height: 6' (182.9 cm) Weight: 85.8 kg (189 lb 2.5 oz) IBW/kg (Calculated) : 77.6  Temp (24hrs), Avg:97.4 F (36.3 C), Min:96.3 F (35.7 C), Max:98.3 F (36.8 C)  Recent Labs  Lab 02/01/20 0232 02/02/20 0515 02/02/20 1407 02/03/20 0525 02/04/20 0257  WBC 13.7* 14.0* 14.9* 15.0* 17.2*  CREATININE 3.04* 3.07* 3.09* 3.32* 3.60*    Estimated Creatinine Clearance: 18.9 mL/min (A) (by C-G formula based on SCr of 3.6 mg/dL (H)).    Allergies  Allergen Reactions  . Plavix [Clopidogrel Bisulfate] Anaphylaxis         Antimicrobials this admission: 4/22 Cefazolin x 1 Linezolid 4/28 >> Cefepime 4/28 >>  Dose adjustments this admission: None  Microbiology results: 4/24 MRSA: neg 4/25 Pleural fluid: ngF 4/28 sputum: sent  Thank you for allowing pharmacy to be a part of this patient's care.  Vertis Kelch, PharmD, [facility] PGY2 Cardiology Pharmacy Resident Phone [phone] 02/04/2020       8:28 AM  Please check [email or web address] for unit-specific pharmacist phone numbers

## 2020-02-04 NOTE — Evaluation (Signed)
Clinical/Bedside Swallow Evaluation Patient Details  Name: Larry [name] MRN: [id number] Date of Birth: 1941/05/15  Today's Date: 02/04/2020 Time: SLP Start Time (ACUTE ONLY): 1150 SLP Stop Time (ACUTE ONLY): 1230 SLP Time Calculation (min) (ACUTE ONLY): 40 min  Past Medical History:  Past Medical History:  Diagnosis Date  . Coronary artery disease    status post remote PCI of the mid to distal RCA 11/1998 followed by acute ST elevation MI inferiorly 08/1999 with occlusion of the prior RCA stent as well as 70% diagonal branch.  He underwent PCI of the RCA  . Hypercholesterolemia   . Hypertension   . Myocardial infarct ([place])   . Olecranon bursitis   . S/P atrial septal defect closure    Successful transcatheter closure of a large secundum ASD using a 26 mm Amplatzer septal occluder device    Past Surgical History:  Past Surgical History:  Procedure Laterality Date  . ATRIAL SEPTAL DEFECT(ASD) CLOSURE N/A 02/01/2020   Procedure: ATRIAL SEPTAL DEFECT (ASD) CLOSURE;  Surgeon: Sherren Mocha, MD;  Location: [facility];  Service: Cardiovascular;  Laterality: N/A;  . CARDIAC CATHETERIZATION  12/15/1999   EF was 55%   . COLONOSCOPY WITH PROPOFOL N/A 12/28/2019   Procedure: COLONOSCOPY WITH PROPOFOL;  Surgeon: Arta Silence, MD;  Location: [facility];  Service: Endoscopy;  Laterality: N/A;  . CORONARY ANGIOPLASTY WITH STENT PLACEMENT  08/15/1999   CAD, status post prior stenting of the mid to distal RCA in 11/1998, with an acute diaphragmatic wall infarction due to total occlusion at the stent site/There is also 70% narrowing in the diagonal branch of the LAD/The LV showed inferior wall hypo- akinesis.-- Successful reperfusion, percutaneous transluminal coronary percutaneous transluminal coronary & placement of a 2nd overlying stent in RCA  . ESOPHAGOGASTRODUODENOSCOPY (EGD) WITH PROPOFOL N/A 12/28/2019   Procedure: ESOPHAGOGASTRODUODENOSCOPY (EGD) WITH PROPOFOL;  Surgeon: Arta Silence, MD;  Location: [facility];  Service: Endoscopy;  Laterality: N/A;  . POLYPECTOMY  12/28/2019   Procedure: POLYPECTOMY;  Surgeon: Arta Silence, MD;  Location: [facility];  Service: Endoscopy;;  . RIGHT HEART CATH N/A 12/25/2019   Procedure: RIGHT HEART CATH;  Surgeon: Larey Dresser, MD;  Location: [facility];  Service: Cardiovascular;  Laterality: N/A;  . RIGHT HEART CATH N/A 01/29/2020   Procedure: RIGHT HEART CATH;  Surgeon: Jolaine Artist, MD;  Location: [facility];  Service: Cardiovascular;  Laterality: N/A;  . TEE WITHOUT CARDIOVERSION N/A 12/25/2019   Procedure: TRANSESOPHAGEAL ECHOCARDIOGRAM (TEE);  Surgeon: Larey Dresser, MD;  Location: [facility];  Service: Cardiovascular;  Laterality: N/A;   HPI:  79 y.o. male who has a history of CAD, PAF, anemia, CAD,  and chronic diastolic CHF.  Patient had initial PCI to RCA in 11/1998.  This was complicated by stent thrombosis with inferior STEMI in 08/1999 requiring PCI.   In 2017, he was noted to be in atrial fibrillation persistently.  He decided not to undergo cardioversion.  Echo in 5/19 showed EF 50-55%, severe biatrial enlargement.   Assessment / Plan / Recommendation Clinical Impression  Pt seen at bedside for clinical swallow evaluation, following RN concerns for coughing after po med administration. Pt is generally weak, CN exam unremarkable. He has adequate dentition. Following trials of nectar thick liquid, pt exhibited an immediate reflexive cough. No cough elicited after trials of puree consistency. Wife was present, and indicated pt would get "strangled" occasionally at home. During this assessment, pt was noted to exhibit a congested, largely  nonproductive cough. He did successfully cough up a fair amount of thick bloody mucus, which was suctioned from oral cavity. Pt reports poor po intake recently, limited to magic cup and ensure supplements. At this time, given presentation at bedside and  possible history of dysphagia, puree diet and honey thick lqiuids are recommended. Instrumental assessment is also recommended to determine least restrictive diet. Pt, wife and daughter were also educated regarding FEES vs MBS as options for instrumental testing. They requested time to think about each evaluation. SLP will continue to follow to facilitate decision making regarding FEES vs MBS. RN and MD informed as well.    SLP Visit Diagnosis: Dysphagia, unspecified (R13.10)    Aspiration Risk  Severe aspiration risk;Risk for inadequate nutrition/hydration    Diet Recommendation Dysphagia 1 (Puree);Honey-thick liquid   Liquid Administration via: Spoon Medication Administration: Crushed with puree Supervision: Full supervision/cueing for compensatory strategies;Staff to assist with self feeding Compensations: Minimize environmental distractions;Slow rate;Small sips/bites Postural Changes: Seated upright at 90 degrees;Remain upright for at least 30 minutes after po intake    Other  Recommendations Oral Care Recommendations: Oral care BID Other Recommendations: Order thickener from pharmacy   Follow up Recommendations (TBD)      Frequency and Duration min 2x/week  2 weeks       Prognosis Prognosis for Safe Diet Advancement: Good      Swallow Study   General Date of Onset: 01/12/2020 HPI: 79 y.o. male who has a history of CAD, PAF, anemia, CAD,  and chronic diastolic CHF.  Patient had initial PCI to RCA in 11/1998.  This was complicated by stent thrombosis with inferior STEMI in 08/1999 requiring PCI.   In 2017, he was noted to be in atrial fibrillation persistently.  He decided not to undergo cardioversion.  Echo in 5/19 showed EF 50-55%, severe biatrial enlargement. Type of Study: Bedside Swallow Evaluation Previous Swallow Assessment: none Diet Prior to this Study: Regular;Thin liquids Temperature Spikes Noted: No Respiratory Status: Nasal cannula(HFNC) Behavior/Cognition:  Cooperative;Lethargic/Drowsy(weak) Oral Cavity Assessment: Within Functional Limits Oral Care Completed by SLP: Recent completion by staff Oral Cavity - Dentition: Adequate natural dentition Self-Feeding Abilities: Total assist Patient Positioning: Upright in bed Baseline Vocal Quality: Low vocal intensity Volitional Cough: Congested;Weak Volitional Swallow: Able to elicit    Oral/Motor/Sensory Function Overall Oral Motor/Sensory Function: Generalized oral weakness   Ice Chips Ice chips: Not tested   Thin Liquid Thin Liquid: Not tested    Nectar Thick Nectar Thick Liquid: Impaired Presentation: Cup Pharyngeal Phase Impairments: Cough - Immediate   Honey Thick Honey Thick Liquid: Not tested   Puree Puree: Within functional limits Presentation: Spoon   Solid     Solid: Not tested     Marizol Borror B. Quentin Ore, [facility], [place] Speech Language Pathologist Office: [phone] Pager: [phone]  Shonna Chock 02/04/2020,1:18 PM

## 2020-02-05 DIAGNOSIS — Z8774 Personal history of (corrected) congenital malformations of heart and circulatory system: Secondary | ICD-10-CM | POA: Diagnosis not present

## 2020-02-05 DIAGNOSIS — J9601 Acute respiratory failure with hypoxia: Secondary | ICD-10-CM | POA: Diagnosis not present

## 2020-02-05 DIAGNOSIS — J90 Pleural effusion, not elsewhere classified: Secondary | ICD-10-CM | POA: Diagnosis not present

## 2020-02-05 DIAGNOSIS — I5033 Acute on chronic diastolic (congestive) heart failure: Secondary | ICD-10-CM | POA: Diagnosis not present

## 2020-02-05 LAB — RENAL FUNCTION PANEL
Albumin: 2.1 g/dL — ABNORMAL LOW (ref 3.5–5.0)
Albumin: 2.1 g/dL — ABNORMAL LOW (ref 3.5–5.0)
Anion gap: 14 (ref 5–15)
Anion gap: 16 — ABNORMAL HIGH (ref 5–15)
BUN: 60 mg/dL — ABNORMAL HIGH (ref 8–23)
BUN: 44 mg/dL — ABNORMAL HIGH (ref 8–23)
CO2: 21 mmol/L — ABNORMAL LOW (ref 22–32)
CO2: 19 mmol/L — ABNORMAL LOW (ref 22–32)
Calcium: 8.4 mg/dL — ABNORMAL LOW (ref 8.9–10.3)
Calcium: 7.6 mg/dL — ABNORMAL LOW (ref 8.9–10.3)
Chloride: 99 mmol/L (ref 98–111)
Chloride: 100 mmol/L (ref 98–111)
Creatinine, Ser: 2.18 mg/dL — ABNORMAL HIGH (ref 0.61–1.24)
Creatinine, Ser: 1.58 mg/dL — ABNORMAL HIGH (ref 0.61–1.24)
GFR calc Af Amer: 33 mL/min — ABNORMAL LOW (ref >60)
GFR calc Af Amer: 48 mL/min — ABNORMAL LOW (ref >60)
GFR calc non Af Amer: 28 mL/min — ABNORMAL LOW (ref >60)
GFR calc non Af Amer: 42 mL/min — ABNORMAL LOW (ref >60)
Glucose, Bld: 132 mg/dL — ABNORMAL HIGH (ref 70–99)
Glucose, Bld: 106 mg/dL — ABNORMAL HIGH (ref 70–99)
Phosphorus: 3.9 mg/dL (ref 2.5–4.6)
Phosphorus: 3.6 mg/dL (ref 2.5–4.6)
Potassium: 4.6 mmol/L (ref 3.5–5.1)
Potassium: 4.9 mmol/L (ref 3.5–5.1)
Sodium: 134 mmol/L — ABNORMAL LOW (ref 135–145)
Sodium: 135 mmol/L (ref 135–145)

## 2020-02-05 LAB — CBC WITH DIFFERENTIAL/PLATELET
Abs Immature Granulocytes: 0.38 10*3/uL — ABNORMAL HIGH (ref 0.00–0.07)
Basophils Absolute: 0 10*3/uL (ref 0.0–0.1)
Basophils Relative: 0 %
Eosinophils Absolute: 0 10*3/uL (ref 0.0–0.5)
Eosinophils Relative: 0 %
HCT: 23.4 % — ABNORMAL LOW (ref 39.0–52.0)
Hemoglobin: 7.7 g/dL — ABNORMAL LOW (ref 13.0–17.0)
Immature Granulocytes: 2 %
Lymphocytes Relative: 2 %
Lymphs Abs: 0.3 10*3/uL — ABNORMAL LOW (ref 0.7–4.0)
MCH: 29.2 pg (ref 26.0–34.0)
MCHC: 32.9 g/dL (ref 30.0–36.0)
MCV: 88.6 fL (ref 80.0–100.0)
Monocytes Absolute: 1.3 10*3/uL — ABNORMAL HIGH (ref 0.1–1.0)
Monocytes Relative: 6 %
Neutro Abs: 18.6 10*3/uL — ABNORMAL HIGH (ref 1.7–7.7)
Neutrophils Relative %: 90 %
Platelets: 87 10*3/uL — ABNORMAL LOW (ref 150–400)
RBC: 2.64 MIL/uL — ABNORMAL LOW (ref 4.22–5.81)
RDW: 21.2 % — ABNORMAL HIGH (ref 11.5–15.5)
WBC: 20.7 10*3/uL — ABNORMAL HIGH (ref 4.0–10.5)
nRBC: 0.2 % (ref 0.0–0.2)

## 2020-02-05 LAB — COOXEMETRY PANEL
Carboxyhemoglobin: 1.8 % — ABNORMAL HIGH (ref 0.5–1.5)
Methemoglobin: 0.3 % (ref 0.0–1.5)
O2 Saturation: 66.9 %
Total hemoglobin: 8.1 g/dL — ABNORMAL LOW (ref 12.0–16.0)

## 2020-02-05 LAB — URINE CULTURE: Culture: NO GROWTH

## 2020-02-05 LAB — PREPARE RBC (CROSSMATCH)

## 2020-02-05 LAB — MAGNESIUM: Magnesium: 2.5 mg/dL — ABNORMAL HIGH (ref 1.7–2.4)

## 2020-02-05 LAB — HEMOGLOBIN AND HEMATOCRIT, BLOOD
HCT: 28 % — ABNORMAL LOW (ref 39.0–52.0)
Hemoglobin: 9.1 g/dL — ABNORMAL LOW (ref 13.0–17.0)

## 2020-02-05 MED ORDER — LINEZOLID 600 MG/300ML IV SOLN
600.0000 mg | Freq: Two times a day (BID) | INTRAVENOUS | Status: DC
  Administered 2020-02-05 – 2020-02-06 (×4): 600 mg via INTRAVENOUS
  Filled 2020-02-05 (×5): qty 300

## 2020-02-05 MED ORDER — DARBEPOETIN ALFA 40 MCG/0.4ML IJ SOSY
40.0000 ug | PREFILLED_SYRINGE | Freq: Once | INTRAMUSCULAR | Status: AC
  Administered 2020-02-05: 08:00:00 40 ug via SUBCUTANEOUS
  Filled 2020-02-05: qty 0.4

## 2020-02-05 MED ORDER — SODIUM CHLORIDE 0.9% IV SOLUTION: Freq: Once | INTRAVENOUS | Status: AC

## 2020-02-05 MED ORDER — RESOURCE THICKENUP CLEAR PO POWD
ORAL | Status: DC | PRN
  Filled 2020-02-05: qty 125

## 2020-02-05 MED ORDER — BENZOCAINE 20 % MT AERO
INHALATION_SPRAY | Freq: Once | OROMUCOSAL | Status: DC
  Filled 2020-02-05: qty 57

## 2020-02-05 MED ORDER — PANTOPRAZOLE SODIUM 40 MG IV SOLR
40.0000 mg | Freq: Two times a day (BID) | INTRAVENOUS | Status: DC
  Administered 2020-02-05 – 2020-02-07 (×6): 40 mg via INTRAVENOUS
  Filled 2020-02-05 (×6): qty 40

## 2020-02-05 NOTE — Evaluation (Signed)
Physical Therapy Evaluation Patient Details Name: Larry [name] MRN: [id number] DOB: 11-Jul-1941 Today's Date: 02/05/2020   History of Present Illness  Pt adm for atrial septal defect closure on 01/26/2020.  Pt developed acute on chronic diastolic heart failure. Pt developed acute respiratory failure and acute pulmonary edema on 4/25. Pt on bipap intermittently.  Thoracentesis on 4/25 with 1700 ml. CRRT initiated 4/28.  PMH - cad, ckd, htn, chf, a fib, STEMI,   Clinical Impression  Eval limited to bed level exercises due to tenuous medical situation. Will continue to follow and progress activity as medical conditions allow. Pt was able to interact and participate in strengthening exercises. Expect will need significant post acute rehab but will defer recommendation of venue for that rehab until we can mobilize patient.     Follow Up Recommendations Other (comment)(To be determined once mobility able to be assessed)    Equipment Recommendations  Other (comment)(to be determined)    Recommendations for Other Services       Precautions / Restrictions Precautions Precautions: Fall;Other (comment)(CRRT, Swan)      Mobility  Bed Mobility               General bed mobility comments: not tested due to medical status  Transfers                 General transfer comment: not tested due to medical status  Ambulation/Gait             General Gait Details: not tested due to medical status  Stairs            Wheelchair Mobility    Modified Rankin (Stroke Patients Only)       Balance                                             Pertinent Vitals/Pain Pain Assessment: Faces Faces Pain Scale: No hurt Pain Location: generalized discomfort Pain Intervention(s): Monitored during session    Home Living Family/patient expects to be discharged to:: Private residence Living Arrangements: Spouse/significant other Available Help at Discharge:  Family;Available 24 hours/day Type of Home: House Home Access: Stairs to enter Entrance Stairs-Rails: Right Entrance Stairs-Number of Steps: 3 Home Layout: Two level;Able to live on main level with bedroom/bathroom Home Equipment: Kasandra Knudsen - single point;Walker - 2 wheels;Wheelchair - manual;Shower seat;Grab bars - tub/shower;Hand held shower head      Prior Function Level of Independence: Needs assistance   Gait / Transfers Assistance Needed: Modified independent with rolling walkr  ADL's / Homemaking Assistance Needed: Assist for getting in shower. Assist for dressing just prior to hospitalization due to fluid        Hand Dominance   Dominant Hand: Left    Extremity/Trunk Assessment   Upper Extremity Assessment Upper Extremity Assessment: Defer to OT evaluation    Lower Extremity Assessment Lower Extremity Assessment: RLE deficits/detail;LLE deficits/detail RLE Deficits / Details: Strength 2+ to 3-/5 LLE Deficits / Details: Strength 2+ to 3-/5       Communication   Communication: Other (comment)(low to no volume)  Cognition Arousal/Alertness: Awake/alert Behavior During Therapy: Flat affect Overall Cognitive Status: Difficult to assess                                 General Comments: Pt following one step  commands with incr time. Nodding/shaking head.      General Comments      Exercises General Exercises - Upper Extremity Shoulder Flexion: AAROM;Both;5 reps;Supine(to 80 degrees) Elbow Flexion: AAROM;Both;10 reps;Supine Elbow Extension: AROM;Both;10 reps;Supine General Exercises - Lower Extremity Ankle Circles/Pumps: AAROM;Both;10 reps;Supine Short Arc Quad: AAROM;Both;10 reps;Supine Heel Slides: AAROM;Both;10 reps;Supine Hip ABduction/ADduction: AAROM;Both;10 reps;Supine   Assessment/Plan    PT Assessment Patient needs continued PT services  PT Problem List Decreased strength;Decreased activity tolerance;Decreased balance;Decreased  mobility;Cardiopulmonary status limiting activity       PT Treatment Interventions DME instruction;Gait training;Functional mobility training;Therapeutic activities;Therapeutic exercise;Balance training;Patient/family education    PT Goals (Current goals can be found in the Care Plan section)  Acute Rehab PT Goals Patient Stated Goal: Not stated PT Goal Formulation: With patient/family Time For Goal Achievement: 02/19/20 Potential to Achieve Goals: Fair    Frequency Min 3X/week   Barriers to discharge Inaccessible home environment stairs to enter home    Co-evaluation               AM-PAC PT "6 Clicks" Mobility  Outcome Measure Help needed turning from your back to your side while in a flat bed without using bedrails?: Total Help needed moving from lying on your back to sitting on the side of a flat bed without using bedrails?: Total Help needed moving to and from a bed to a chair (including a wheelchair)?: Total Help needed standing up from a chair using your arms (e.g., wheelchair or bedside chair)?: Total Help needed to walk in hospital room?: Total Help needed climbing 3-5 steps with a railing? : Total 6 Click Score: 6    End of Session Equipment Utilized During Treatment: Oxygen Activity Tolerance: Patient tolerated treatment well Patient left: in bed;with call bell/phone within reach;with family/visitor present   PT Visit Diagnosis: Other abnormalities of gait and mobility (R26.89);Muscle weakness (generalized) (M62.81)    Time: 2633-3545 PT Time Calculation (min) (ACUTE ONLY): 18 min   Charges:   PT Evaluation $PT Eval Moderate Complexity: [place] Pager [phone] Office [place] 02/05/2020, 4:51 PM

## 2020-02-05 NOTE — Progress Notes (Signed)
NAME:  Larry [name], MRN:  [id number], DOB:  1941-07-02, LOS: 6 ADMISSION DATE:  01/13/2020, CONSULTATION DATE:  4/25 REFERRING MD:  Bensimhon, CHIEF COMPLAINT:  Pleural effusion   Brief History   Chronic right heart failure due to ASD S/p ASD closure, now with acute respiratory failure due to acute pulmonary edema. Requiring BiPAP.  History of present illness   [name] is a 79 y/o gentleman admitted for closure of a 3cm ASD diagnosed during evaluation of right heart failure. He underwent ASD closure on 4/22.  He went into acute respiratory failure with worsening renal function on 4/24.  He had Swan-Ganz catheter, dialysis catheter placed to guide treatment.  He has had good response to diuresis.  He was able to come off BiPAP 4/23, but today required BiPAP again on 4/25. On BiPAP SOB was controlled and improved after thoracentesis on 4/25 yielding 1.7L of transudative fluid. PCCM signed off 4/26 and re-consulted 4/28 due to continued BiPAP requirements and concern for possible sepsis given CXR showing Edema vs Airspace disease with increased WBC and elevated Procalcitonin.  Past Medical History  CKD ASD CAD HLD HTN HFpEF w/ RHF  Significant Hospital Events   4/22 ASD closure 4/24 Right heart cath with Swan-Ganz placement, dialysis catheter placement 4/25 R Thoracentesis (-1/7L Transudate)  Consults:  PCCM Cardiology Nephro  Procedures:  4/22 ASD closure 4/24 Right heart cath with Swan-Ganz placement, dialysis catheter placement 4/25 R Thoracentesis (-1/7L Transudate)  Significant Diagnostic Tests:  CXR (4/25): Decreased RIGHT pleural effusion and improved RIGHT LOWER lung aeration. No evidence of pneumothorax.  CXR (4/26): Generalized pulmonary opacity with interval progression. Stable small right pleural effusion.  CXR (4/28): Stable bilateral lung opacities are noted consistent with pulmonary edema or pneumonia. Stable mild right pleural effusion is noted.  Micro  Data:  4/26 Pleural fluid: > WBC cells, no organisms, culture NG 4/28 Blood Cx: Pending 4/28 Urine Cx: NG 4/28 Sputum Cx: moderate GNRs  Antimicrobials:  Cefazolin 4/21 Linezolid 4/28 >> Cefepime 4/28 >>  Interim history/subjective:  Bloody stools overnight, stopping eliquis today.   Objective   Blood pressure (!) 88/60, pulse 79, temperature (!) 95.5 F (35.3 C), resp. rate 14, height 6' (1.829 m), weight 84.7 kg, SpO2 99 %. PAP: (38-60)/(14-34) 49/24 CVP:  [6 mmHg-16 mmHg] 12 mmHg PCWP:  [23 mmHg-24 mmHg] 24 mmHg CO:  [5 L/min-7.1 L/min] 5 L/min CI:  [2.4 L/min/m2-3.4 L/min/m2] 2.4 L/min/m2  FiO2 (%):  [40 %] 40 %   Intake/Output Summary (Last 24 hours) at 02/05/2020 0826 Last data filed at 02/05/2020 0800 Gross per 24 hour  Intake 1816.75 ml  Output 2477 ml  Net -660.25 ml   Filed Weights   02/03/20 0500 02/04/20 0500 02/05/20 0548  Weight: 84.9 kg 85.8 kg 84.7 kg    Examination:  General: Ill-appearing elderly man lying in bed in no acute distress, lethargic HEENT: [place]/AT, eyes anicteric Cardio: Regular rate and rhythm Resp: Breathing comfortably on nasal cannula.  Rhonchi bilaterally, weak ineffective cough.  No accessory muscle use.  Reduced right basilar breath sounds Abdomen: Tender to palpation, soft Extremities: Anasarca, no clubbing or cyanosis Neuro: Awake and alert, not particularly interactive.  Globally weak, but moving all extremities. Derm: Pallor, bruising on upper extremities  Resolved Hospital Problem list     Assessment & Plan:  HAP- GNR Acute hypoxic respiratory failure Acute pulmonary edema Right-sided transudative pleural effusion -Continue alternating BiPAP and nasal cannula.  Titrate down FiO2 as able. -Chest PT every 4 hours,  NT suctioning as needed.  The goal is to prevent significant mucus plugging and could progress to requiring intubation. -Continue broad-spectrum antibiotics -Follow sputum culture  Shock-possibly septic, more  distributive and cardiogenic -Continue vasopressors as required to maintain MAP greater than 65 -Continue broad-spectrum antibiotics  Afib, CAD, ASD s/p closure, A/C dHF with RHF -Agree with CRRT for volume removal -Ongoing vasopressors  Hypervolemic hyponatremia due to heart failure and kidney injury AGMA 2/2 Uremia AKI on CKD: Now on CRRT -CRRT per nephrology  Likely aspiration  -Agree with SLP's plan for barium swallow when able -Agree with core track for enteral nutrition  Acute blood loss anemia- possibly lower GI bleed while on anticoagulation -Agree with stopping Eliquis -Planning 1 unit PRBCs today Agree with high-dose PPI -Supportive management for now.  He will be unlikely to tolerate intervention to further evaluate source of bleeding at this time without mechanical ventilation.  Thrombocytopenia possibly due to acute illness, less likely due to medications -Continue to monitor -No indication for transfusion currently  Daughter and wife updated and bedside and discussed plan extensively.   Best practice:  Per primary Disposition: ICU  Labs   CBC: Recent Labs  Lab 02/02/20 1407 02/02/20 1407 02/03/20 0525 02/04/20 0125 02/04/20 0257 02/04/20 2305 02/05/20 0408  WBC 14.9*  --  15.0*  --  17.2* 21.0* 20.7*  NEUTROABS  --   --  13.1*  --  15.1*  --  18.6*  HGB 8.2*   < > 7.5* 11.9* 7.9* 8.8* 7.7*  HCT 25.2*   < > 23.1* 35.0* 23.1* 26.3* 23.4*  MCV 90.6  --  89.5  --  87.8 88.3 88.6  PLT 88*  --  77*  --  87* 95* 87*   < > = values in this interval not displayed.    Basic Metabolic Panel: Recent Labs  Lab 02/02/20 0515 02/02/20 0515 02/02/20 1407 02/02/20 1407 02/03/20 0525 02/04/20 0125 02/04/20 0257 02/04/20 1622 02/05/20 0408  NA 129*   < > 129*   < > 128* 122* 126* 128* 134*  K 3.1*   < > 3.6   < > 3.7 4.1 4.4 4.5 4.6  CL 85*   < > 86*  --  85*  --  86* 90* 99  CO2 29   < > 29  --  28  --  21* 23 21*  GLUCOSE 171*   < > 138*  --  147*  --   146* 149* 132*  BUN 87*   < > 88*  --  96*  --  110* 92* 60*  CREATININE 3.07*   < > 3.09*  --  3.32*  --  3.60* 3.00* 2.18*  CALCIUM 8.3*   < > 8.6*  --  8.4*  --  8.6* 8.4* 8.4*  MG 2.3  --   --   --  1.9  --   --   --  2.5*  PHOS  --   --   --   --   --   --   --  4.6 3.9   < > = values in this interval not displayed.   GFR: Estimated Creatinine Clearance: 31.1 mL/min (A) (by C-G formula based on SCr of 2.18 mg/dL (H)). Recent Labs  Lab 02/03/20 0525 02/04/20 0257 02/04/20 0759 02/04/20 2305 02/05/20 0408  PROCALCITON  --   --  4.28  --   --   WBC 15.0* 17.2*  --  21.0* 20.7*    Liver  Function Tests: Recent Labs  Lab 02/04/20 1622 02/05/20 0408  ALBUMIN 2.2* 2.1*   No results for input(s): LIPASE, AMYLASE in the last 168 hours. No results for input(s): AMMONIA in the last 168 hours.  ABG    Component Value Date/Time   PHART 7.434 02/04/2020 0125   PCO2ART 43.6 02/04/2020 0125   PO2ART 355 (H) 02/04/2020 0125   HCO3 29.4 (H) 02/04/2020 0125   TCO2 31 02/04/2020 0125   ACIDBASEDEF 2.0 12/25/2019 0845   O2SAT 66.9 02/05/2020 0408     Coagulation Profile: No results for input(s): INR, PROTIME in the last 168 hours.  Cardiac Enzymes: No results for input(s): CKTOTAL, CKMB, CKMBINDEX, TROPONINI in the last 168 hours.  HbA1C: No results found for: HGBA1C  CBG: No results for input(s): GLUCAP in the last 168 hours.   This patient is critically ill with multiple organ system failure which requires frequent high complexity decision making, assessment, support, evaluation, and titration of therapies. This was completed through the application of advanced monitoring technologies and extensive interpretation of multiple databases. During this encounter critical care time was devoted to patient care services described in this note for 50 minutes.

## 2020-02-05 NOTE — Progress Notes (Signed)
[facility] Progress Note  Name: Larry [name] MRN: [id number] DOB: [birth year]  Chief Complaint:  Presented for ASD repair  Subjective:  Seen on CRRT and procedure supervised.  Started on CRRT on 4/28 after nontunneled catheter placed.  Goal 50 to 100 an hour and has had 2.1 liters UF over 4/28 reporting period as charted thus far with CRRT.  Has been on levo at 32 and vaso.  Had black tarry stools o/n and team was notified.  Has been on/off of bipap per nursing and now on nasal cannula  Review of systems:  Denies shortness of breath  - states breathing ok  No n/v No chest pain   Intake/Output Summary (Last 24 hours) at 02/05/2020 0556 Last data filed at 02/05/2020 0500 Gross per 24 hour  Intake 1940.99 ml  Output 2195 ml  Net -254.01 ml    Vitals:  Vitals:   02/05/20 0400 02/05/20 0415 02/05/20 0430 02/05/20 0548  BP:      Pulse: 79 73 79   Resp: 18 18 14    Temp:      TempSrc:      SpO2: 97% 100% 99%   Weight:    84.7 kg  Height:         Physical Exam:  General adult male in bed resting comfortably - nasal cannula 12 liters high flow  HEENT normocephalic atraumatic extraocular movements intact sclera anicteric Neck supple trachea midline Lungs occ rhonchi unlabored at rest but high oxygen requirement  Heart S1S2 no rub Abdomen soft nontender nondistended Extremities 2-3+ edema  Psych normal mood and affect Left IJ nontunneled catheter   Medications reviewed   Labs:  BMP Latest Ref Rng & Units 02/05/2020 02/04/2020 02/04/2020  Glucose 70 - 99 mg/dL 132(H) 149(H) 146(H)  BUN 8 - 23 mg/dL 60(H) 92(H) 110(H)  Creatinine 0.61 - 1.24 mg/dL 2.18(H) 3.00(H) 3.60(H)  BUN/Creat Ratio 10 - 24 - - -  Sodium 135 - 145 mmol/L 134(L) 128(L) 126(L)  Potassium 3.5 - 5.1 mmol/L 4.6 4.5 4.4  Chloride 98 - 111 mmol/L 99 90(L) 86(L)  CO2 22 - 32 mmol/L 21(L) 23 21(L)  Calcium 8.9 - 10.3 mg/dL 8.4(L) 8.4(L) 8.6(L)    Assessment/Plan:   # Acute kidney injury  -  with prerenal and ischemic insults in the setting of shock with overload and hypotension.  Has been receiving Lasix and diminishing urine output with respiratory distress.  Left IJ nontunneled catheter was placed. Started CRRT 4/28.   - 4K fluids  - 50 ml/hr off as tolerated for now   # Fluid volume overload - Optimize volume with CRRT as above  # Acute hypoxic respiratory failure - On supplemental oxygen.   - Optimize volume status with CRRT  # Septic shock - Pressors and antibiotics per primary team  # Status post ASD repair per cardiology  # Hyponatremia - Setting of overload.  Also has been on Lasix and diminishing response.  Follow with CRRT  # Iron deficiency anemia - Transfusions per primary team - note report of dark stools  - given renal insufficiency will go ahead with aranesp 40 mcg once 4/29 as well    # CKD stage III  - baseline Cr is 1.84 per 11/2019 but rose to 2 - 2.4 in March 2021 with possible progression to stage IV  Claudia Desanctis, MD 02/05/2020 6:13 AM

## 2020-02-05 NOTE — Progress Notes (Signed)
RT attempted to NTS patient. Suction catheter passed with ease through right nare, however was unable to suction secretions. RT did note some small amount of blood on catheter upon withdraw.  Patient's spo2 decreased to 85% during suction attempt but increased back to 92%. RT will continue to monitor as needed.

## 2020-02-05 NOTE — Progress Notes (Addendum)
Patient ID: Larry [name], male   DOB: 09/24/1941, 79 y.o.   MRN: [id number]    Advanced Heart Failure Rounding Note   Subjective:    Developed acute pulmonary edema with respiratory failure and worsening renal function.  Underwent placement of a Swan-Ganz catheter and temporary dialysis catheter.  Had 3 black stools overnight. Hgb 7.7 today.   Remains on CVVHD pulling 50 per hour.   Denies SOB. Wants to try and eat.   Swan #s done personally CVP 11 PA 44/15 Thermo CI 2.6  Co-ox 67%  SVR 778   Echo (4/21): EF 60-65%, RV looked relatively normal, ASD closure device intact, dilated IVC  Objective:   Weight Range:  Vital Signs:   Temp:  [93 F (33.9 C)-97.7 F (36.5 C)] 95.5 F (35.3 C) (04/29 0315) Pulse Rate:  [64-117] 79 (04/29 0430) Resp:  [13-31] 14 (04/29 0430) BP: (80-115)/(48-83) 88/60 (04/29 0345) SpO2:  [73 %-100 %] 99 % (04/29 0430) Arterial Line BP: (84-114)/(43-58) 91/45 (04/29 0400) FiO2 (%):  [40 %] 40 % (04/28 1600) Weight:  [84.7 kg] 84.7 kg (04/29 0548) Last BM Date: 02/04/20  Weight change: Filed Weights   02/03/20 0500 02/04/20 0500 02/05/20 0548  Weight: 84.9 kg 85.8 kg 84.7 kg    Intake/Output:   Intake/Output Summary (Last 24 hours) at 02/05/2020 0741 Last data filed at 02/05/2020 0700 Gross per 24 hour  Intake 2056.75 ml  Output 2374 ml  Net -317.25 ml     Physical Exam: General: Appears weak HEENT: normal Neck: supple. no JVD. Carotids 2+ bilat; no bruits. No lymphadenopathy or thryomegaly appreciated. LIJ HD catheter RIJ swan  Cor: PMI nondisplaced. Regular rate & rhythm. No rubs, gallops or murmurs. Lungs: Rhonchi throughtout on 11 liters [place] Abdomen: soft, nontender, nondistended. No hepatosplenomegaly. No bruits or masses. Good bowel sounds. Extremities: no cyanosis, clubbing, rash, R and LLE 3+ edema Neuro: alert & orientedx3, cranial nerves grossly intact. moves all 4 extremities w/o difficulty. Affect flat    Telemetry: NSR  70s   Labs: Basic Metabolic Panel: Recent Labs  Lab 02/02/20 0515 02/02/20 0515 02/02/20 1407 02/02/20 1407 02/03/20 0525 02/03/20 0525 02/04/20 0125 02/04/20 0257 02/04/20 1622 02/05/20 0408  NA 129*   < > 129*   < > 128*  --  122* 126* 128* 134*  K 3.1*   < > 3.6   < > 3.7  --  4.1 4.4 4.5 4.6  CL 85*   < > 86*  --  85*  --   --  86* 90* 99  CO2 29   < > 29  --  28  --   --  21* 23 21*  GLUCOSE 171*   < > 138*  --  147*  --   --  146* 149* 132*  BUN 87*   < > 88*  --  96*  --   --  110* 92* 60*  CREATININE 3.07*   < > 3.09*  --  3.32*  --   --  3.60* 3.00* 2.18*  CALCIUM 8.3*   < > 8.6*   < > 8.4*   < >  --  8.6* 8.4* 8.4*  MG 2.3  --   --   --  1.9  --   --   --   --  2.5*  PHOS  --   --   --   --   --   --   --   --  4.6 3.9   < > =  values in this interval not displayed.    Liver Function Tests: Recent Labs  Lab 02/04/20 1622 02/05/20 0408  ALBUMIN 2.2* 2.1*   No results for input(s): LIPASE, AMYLASE in the last 168 hours. No results for input(s): AMMONIA in the last 168 hours.  CBC: Recent Labs  Lab 02/02/20 1407 02/02/20 1407 02/03/20 0525 02/04/20 0125 02/04/20 0257 02/04/20 2305 02/05/20 0408  WBC 14.9*  --  15.0*  --  17.2* 21.0* 20.7*  NEUTROABS  --   --  13.1*  --  15.1*  --  18.6*  HGB 8.2*   < > 7.5* 11.9* 7.9* 8.8* 7.7*  HCT 25.2*   < > 23.1* 35.0* 23.1* 26.3* 23.4*  MCV 90.6  --  89.5  --  87.8 88.3 88.6  PLT 88*  --  77*  --  87* 95* 87*   < > = values in this interval not displayed.    Cardiac Enzymes: No results for input(s): CKTOTAL, CKMB, CKMBINDEX, TROPONINI in the last 168 hours.  BNP: BNP (last 3 results) Recent Labs    12/19/19 1404 01/15/20 1422 01/24/2020 1106  BNP 192.2* 607.6* 573.5*    ProBNP (last 3 results) No results for input(s): PROBNP in the last 8760 hours.    Other results:  Imaging: DG CHEST PORT 1 VIEW  Result Date: 02/04/2020 CLINICAL DATA:  Shortness of breath. EXAM: PORTABLE CHEST 1 VIEW  COMPARISON:  February 03, 2020. FINDINGS: Stable cardiomediastinal silhouette. No pneumothorax is noted. Stable position of left internal jugular catheter in right internal jugular Swan-Ganz catheter. Atherosclerosis of thoracic aorta is noted. Stable bilateral lung opacities are noted consistent with pulmonary edema or pneumonia. Stable mild right pleural effusion is noted. Bony thorax is unremarkable. IMPRESSION: Stable bilateral lung opacities are noted consistent with pulmonary edema or pneumonia. Stable mild right pleural effusion is noted. Aortic Atherosclerosis (ICD10-I70.0). Electronically Signed   By: Marijo Conception M.D.   On: 02/04/2020 10:27   DG CHEST PORT 1 VIEW  Result Date: 02/03/2020 CLINICAL DATA:  Shortness of breath, status post atrial septal defect closure, CHF. EXAM: PORTABLE CHEST 1 VIEW COMPARISON:  Prior chest radiographs 02/02/2020 and earlier FINDINGS: Unchanged position of a left IJ approach central venous catheter with tip projecting in the region of the SVC. Unchanged position of a right IJ approach Swan-Ganz catheter with tip projecting in the region of the right main pulmonary artery. Overlying cardiac monitoring leads. Heart size at the upper limits of normal, unchanged. Aortic atherosclerosis. Redemonstrated diffuse interstitial and ill-defined opacity throughout both lungs. Persistent small right and trace left pleural effusions. No evidence of pneumothorax. No acute bony abnormality. IMPRESSION: Unchanged support apparatus positioning. Similar appearance of diffuse interstitial and ill-defined airspace opacity throughout both lungs, which may reflect pulmonary edema and/or pneumonia. Persistent small right and trace left pleural effusions. Aortic Atherosclerosis (ICD10-I70.0). Electronically Signed   By: Kellie Simmering DO   On: 02/03/2020 09:24     Medications:     Scheduled Medications: . apixaban  5 mg Oral BID  . Chlorhexidine Gluconate Cloth  6 each Topical Daily  .  darbepoetin (ARANESP) injection - NON-DIALYSIS  40 mcg Subcutaneous Once  . docusate sodium  100 mg Oral BID  . feeding supplement (ENSURE ENLIVE)  237 mL Oral TID BM  . linezolid  600 mg Oral Q12H  . mouth rinse  15 mL Mouth Rinse BID  . midodrine  15 mg Oral TID WC  . multivitamin  1 tablet Oral QHS  .  pantoprazole  40 mg Oral BID  . pravastatin  40 mg Oral QPM  . sodium chloride flush  10-40 mL Intracatheter Q12H  . sodium chloride flush  3 mL Intravenous Q12H    Infusions: .  prismasol BGK 4/2.5 400 mL/hr at 02/04/20 1156  .  prismasol BGK 4/2.5 300 mL/hr at 02/05/20 0519  . sodium chloride    . amiodarone 30 mg/hr (02/05/20 0700)  . ceFEPime (MAXIPIME) IV Stopped (02/04/20 2211)  . norepinephrine (LEVOPHED) Adult infusion 32 mcg/min (02/05/20 0700)  . prismasol BGK 4/2.5 1,800 mL/hr at 02/05/20 0512  . vasopressin (PITRESSIN) infusion - *FOR SHOCK* 0.03 Units/min (02/05/20 0700)    PRN Medications: Place/Maintain arterial line **AND** sodium chloride, acetaminophen, bisacodyl, heparin, nitroGLYCERIN, ondansetron (ZOFRAN) IV, oxyCODONE, polyethylene glycol, Resource ThickenUp Clear, sodium chloride flush   Assessment.Plan:   1. Acute hypoxic respiratory failure with acute pulmonary edema and R pleural effusion: Due to pulmonary edema s/p ASD closure. Remains on Bipap.  S/p right thoracentesis 4/25, transudative (CHF).  CXR yesterday with diffuse airspace disease. Low SVR from Swan, WBCs up to 20=> ?component of PNA.  Poor diuresis with IV Lasix.  - On 11 liter  - Now on CVVH for volume removal. - Covering empirically for PNA (linezolid/cefepime).    2. Shock: SVR is low, cardiac output is preserved (CI 2.6 off Swan).  He is requiring NE 32.  This appears to be primarily vasodilatory shock, ?septic shock though no fever.  WBCs up to 20  - As above, will cover empirically with abx  - Procalcitonin 4.28.  - Continue NE + vasopressin 3. Large secundum ASD s/p closure: s/p  successful transcatheter closure of a large secundum ASD using a 26 mm Amplatzer septal occluder deviceon 01/11/2020.  Post op limited echo showed EF 60-65% with well seated ASD closure device with no residual flow. 4. Acute on chronic diastolic CHF with prominent RV failure: Still with predominant RV failure by Luiz Blare in setting of long-standing ASD, but also pulmonary edema after ASD closure.  Still with large oxygen requirement and CXR with diffuse airspace disease/pulmonary edema yesterday am. - Maps remain soft, currently on NE 32 + vasopressin 0.03 units,  and midodrine 15 mg tid..  - Cardiac output 5.6.    - Started CVVHD on 4/28. Increase to 100 cc/hr UF net.  4. Acute on CKD stage IV: Creatinine baseline 2.1- 2.5.  - Nephrology consulted. Started on CVVHD on 4/28     5. Paroxysmal aifb/flutter: developed recurrent AF on 4/24.  Eliquis stopped 4/24 due to oozing at line site with falling hgb, restarted 4/27.  - In NSR today.  - Continue amiodarone 30 mg/hr.  - Hold apixaban for now with recurrent GI bleeding.      6. CAD: s/p remotePCI to RCA, had stent thrombosis back in 2000.No chest pain.  - Pravastatin.  - No ASA with Eliquis use.  7. History of GI bleed with chronic anemia: hgb 12.6 -> 9.4 -> 8.8 -> 8.6 -> 8 -> 7.5 -> 7.9 ->7.7. FOBT+, dark stool.  4/28 FOBT +  - Transfuse 1 unit PRBCs today.  8. ID: Afebrile,  WBC 15.2 -> 13.7 -> 14 -> 15 -> 17-> 20 .   SVR is low suggesting vasodilatory shock, ?septic. CXR cannot rule out component of PNA along with pulmonary edema.  - Procalcitonin 4.3 - Cultures obtained 4/28  blood, sputum, urine.  - Empiric cefepime/linezolid.  9. Hyponatremia: Hypervolemic hyponatremia.  - Sodium up to 134.  10. Thrombocytopenia: Suspect related to acute medical illness, plts remains 87k.  No heparin. - Stop eliquis as above.   11. Hematuria: Had difficult foley placement.  Having some hematuria, will follow for now with Eliquis continued.  This seems to  be improving.  Place cortrak. Speech for evaluation.   Length of Stay: [place] NP-C  02/05/2020, 7:41 AM  Advanced Heart Failure Team Pager [phone] (M-F; 7a - 4p)  Please contact [facility] for night-coverage after hours (4p -7a ) and weekends on [email or web address]  Patient seen with NP, agree with the above note.   CVVH started yesterday, pulling net UF 50 cc/hr.  Now on NE 32, vasopressin 0.03, midodrine 15 mg tid.  CVP 11 today, still requiring HFNC or Bipap. Hgb down to 7.7 with dark stool.  He is back in NSR.   General: Weak-appearing Neck: JVP 12-14 cm, no thyromegaly or thyroid nodule.  Lungs: Crackles at bases.  CV: Nondisplaced PMI.  Heart regular S1/S2, no S3/S4, no murmur.  1+ edema to thighs.  Abdomen: Soft, nontender, no hepatosplenomegaly, no distention.  Skin: Intact without lesions or rashes.  Neurologic: Alert and oriented x 3.  Psych: Normal affect. Extremities: No clubbing or cyanosis.  HEENT: Normal.   Still with pulmonary edema + concern for PNA.  On HFNC.   - Increase rate of fluid removal, UF 100 cc/hr net.  - May need thoracentesis again, follow with CVVH.   Still with high pressor requirement, SVR relatively low.  WBCs up to 20.7.  I am concerned for vasodilatory/septic shock as the main mechanism for low BP.   - Continue pressor support, wean as able. - Empiric coverage for PNA with cefepime/linezolid.   Recurrent GI bleeding, hgb 7.7.  Will hold anticoagulation and give 1 unit PRBCs.   Concern for aspiration.  Long talk with family about risk from aspiration.  - Would like Cortrack placed.  - Would like speech/swallow evaluation at bedside again, family resistant to going down for barium swallow.   CRITICAL CARE Performed by: [name]  Total critical care time: 45 minutes  Critical care time was exclusive of separately billable procedures and treating other patients.  Critical care was necessary to treat or prevent imminent or  life-threatening deterioration.  Critical care was time spent personally by me on the following activities: development of treatment plan with patient and/or surrogate as well as nursing, discussions with consultants, evaluation of patient's response to treatment, examination of patient, obtaining history from patient or surrogate, ordering and performing treatments and interventions, ordering and review of laboratory studies, ordering and review of radiographic studies, pulse oximetry and re-evaluation of patient's condition.  [name] 02/05/2020 8:19 AM

## 2020-02-05 NOTE — Progress Notes (Signed)
Occult positive stool:  Patient had black tarry stools that tested positive for occult blood.  Eliquis held and MD paged for guidance.  Order placed for CBC to check Hgb and other blood counts.  2300:   Results of CBC came back with Hgb 8.8.  No need to transfuse at this time, but MD order to hold Eliquis through night and reassess with rounding physician's in the morning.

## 2020-02-05 NOTE — Progress Notes (Signed)
Nutrition Follow-up  DOCUMENTATION CODES:   Severe malnutrition in context of chronic illness  INTERVENTION:   Tube Feeding via Cortrak:  Vital 1.5 at 50 ml/hr Begin TF at 20 ml/hr; titrate by 10 mL q 8 hours until goal rate of 50 ml/hr Pro-Stat 30 mL TID Provides 2100 kcals, 126 g of protein and 912 mL of free water Meets 100% estimated calorie and protein needs  Continue po diet as tolerated  Magic cup TID with meals, each supplement provides 290 kcal and 9 grams of protein  Pt can continue Ensure Enlive and hormel health shakes but needs to be thickened to honey thick consistency at present time  NUTRITION DIAGNOSIS:   Severe Malnutrition related to chronic illness as evidenced by severe muscle depletion, severe fat depletion, edema.  Being addressed via TF  GOAL:   Patient will meet greater than or equal to 90% of their needs  Progressing  MONITOR:   PO intake, Supplement acceptance, Labs, Weight trends  REASON FOR ASSESSMENT:   Rounds    ASSESSMENT:   79 yo male admitted for closure of 3cm ASD, developed acute respiratory failure with pulmonary edema and AKI on CKD IV requiring CRRT. PMH includes CKD IV, HTN, MI, CAD   4/22 ASD closure 4/24 Worsening renal and respiraatory function, R. Heart cath with Swan-Ganz placement, HD cath placement 4/25 R. Thoracentesis with 1.7 L removed 4/28 CRRT initiated 4/28 CRRT  Pt tolearting CRRT  Recorded po intake yesterday 0% at breakfast and lunch, 25% at dinner. Wife indicating that pt drank 1.5 Hormel shakes yesterday and tolerated  Noted order for Cortrak, this RD had long conversation with wife and daughter regarding Cortrak tube and enteral nutrition. Wife and daughter with many appropriate questions and all questions answered. Wife not thrilled by thought of Cortrak placement but after giving it thought she believes that it is what is best for her husband. Wife understands that without proper nutrition, pt cannot  heal. Also explained to wife that if pt has an active diet order and it pt is alert and wanting to eat, then pt may still eat and drink once Cortrak placed  Labs: sodium 134 (L), Creatinine 2.18, BUN 60 Meds: Rena-Vit  Diet Order:   Diet Order            DIET - DYS 1 Room service appropriate? Yes; Fluid consistency: Honey Thick  Diet effective now              EDUCATION NEEDS:   Education needs have been addressed  Skin:  Skin Assessment: Skin Integrity Issues: Skin Integrity Issues:: DTI DTI: foot  Last BM:  4/28  Height:   Ht Readings from Last 1 Encounters:  02/03/20 6' (1.829 m)    Weight:   Wt Readings from Last 1 Encounters:  02/05/20 84.7 kg    BMI:  Body mass index is 25.33 kg/m.  Estimated Nutritional Needs:   Kcal:  2100-2400 kcals  Protein:  120-140 g  Fluid:  1.5 L    Kerman Passey MS, RDN, LDN, CNSC RD Pager Number and RD On-Call Pager Number Located in [place]

## 2020-02-05 NOTE — Progress Notes (Addendum)
  Speech Language Pathology Treatment: Dysphagia  Patient Details Name: Larry [name] MRN: [id number] DOB: Mar 07, 1941 Today's Date: 02/05/2020 Time: 9379-0240 SLP Time Calculation (min) (ACUTE ONLY): 60 min  Assessment / Plan / Recommendation Clinical Impression  Therapy focused on education with patient/family and pt's tolerance of puree texture and honey thick liquids via subjective measures. Wife and daughter expressed frustration in regards to handling of pt's swallow status. They  report he occasionally has trouble swallowing cornbread prior and that difficulty with potassium pill yesterday was due to dry mouth just coming off Bipap coupled with size of pill leading to order for ST to evaluate swallow.  Pt repositioned to upright and oral cavity cleaned. He appears significantly weak and deconditioned with wet and significantly low vocal intensity. He produced weak volitional throat clear but unable to achieve subglottal pressure for cough. SLP concerned that pt may be unable to protect his airway and clear penetrates aspirates. Suspected difficulty initiating a swallow and possible tongue base pumping with slight movements of laryngeal area when attempting to complete full swallow for delayed period. Once swallow is achieved he exhibits 2-5 sub swallow suspicious for residue in pharynx and inability to clear. Vocal quality remains wet throughout trials with magic cup (puree texture) and honey thickened coke. This therapist recommends crushed meds in puree until he receives Cortrak tomorrow then NPO until he is medically appropriate for objective assessment. Daughter communicated that she plans to give pt liquids if he requests them.    HPI HPI: 79 y.o. male who has a history of CAD, PAF, anemia, CAD,  and chronic diastolic CHF.  Patient had initial PCI (percantanous coronary intervention) to RCA in 11/1998. This was complicated by stent thrombosis with inferior STEMI in 08/1999 requiring PCI.   In  2017, he was noted to be in atrial fibrillation persistently.  He decided not to undergo cardioversion.  Echo in 5/19 showed EF 50-55%, severe biatrial enlargement.      SLP Plan  Continue with current plan of care       Recommendations  Diet recommendations: Other(comment)(crushed meds in pudding until Cortrak placed 4/30) Medication Administration: Crushed with puree                Oral Care Recommendations: Oral care BID Follow up Recommendations: 24 hour supervision/assistance SLP Visit Diagnosis: Dysphagia, pharyngeal phase (R13.13) Plan: Continue with current plan of care       GO                Houston Siren 02/05/2020, 1:41 PM  Orbie Pyo Colvin Caroli.Ed Risk analyst [phone] Office [phone]

## 2020-02-06 ENCOUNTER — Inpatient Hospital Stay (HOSPITAL_COMMUNITY): Payer: PPO

## 2020-02-06 ENCOUNTER — Encounter (HOSPITAL_COMMUNITY): Payer: Self-pay

## 2020-02-06 DIAGNOSIS — J9601 Acute respiratory failure with hypoxia: Secondary | ICD-10-CM | POA: Diagnosis not present

## 2020-02-06 DIAGNOSIS — Z9911 Dependence on respirator [ventilator] status: Secondary | ICD-10-CM | POA: Diagnosis not present

## 2020-02-06 DIAGNOSIS — A419 Sepsis, unspecified organism: Secondary | ICD-10-CM | POA: Diagnosis not present

## 2020-02-06 DIAGNOSIS — J90 Pleural effusion, not elsewhere classified: Secondary | ICD-10-CM | POA: Diagnosis not present

## 2020-02-06 DIAGNOSIS — J181 Lobar pneumonia, unspecified organism: Secondary | ICD-10-CM | POA: Diagnosis not present

## 2020-02-06 DIAGNOSIS — Z8774 Personal history of (corrected) congenital malformations of heart and circulatory system: Secondary | ICD-10-CM | POA: Diagnosis not present

## 2020-02-06 DIAGNOSIS — R6521 Severe sepsis with septic shock: Secondary | ICD-10-CM

## 2020-02-06 DIAGNOSIS — G9341 Metabolic encephalopathy: Secondary | ICD-10-CM

## 2020-02-06 DIAGNOSIS — I5033 Acute on chronic diastolic (congestive) heart failure: Secondary | ICD-10-CM | POA: Diagnosis not present

## 2020-02-06 LAB — CBC WITH DIFFERENTIAL/PLATELET
Abs Immature Granulocytes: 0.5 10*3/uL — ABNORMAL HIGH (ref 0.00–0.07)
Basophils Absolute: 0.1 10*3/uL (ref 0.0–0.1)
Basophils Relative: 0 %
Eosinophils Absolute: 0 10*3/uL (ref 0.0–0.5)
Eosinophils Relative: 0 %
HCT: 27.7 % — ABNORMAL LOW (ref 39.0–52.0)
Hemoglobin: 9.2 g/dL — ABNORMAL LOW (ref 13.0–17.0)
Immature Granulocytes: 2 %
Lymphocytes Relative: 2 %
Lymphs Abs: 0.5 10*3/uL — ABNORMAL LOW (ref 0.7–4.0)
MCH: 29 pg (ref 26.0–34.0)
MCHC: 33.2 g/dL (ref 30.0–36.0)
MCV: 87.4 fL (ref 80.0–100.0)
Monocytes Absolute: 1.1 10*3/uL — ABNORMAL HIGH (ref 0.1–1.0)
Monocytes Relative: 5 %
Neutro Abs: 19.4 10*3/uL — ABNORMAL HIGH (ref 1.7–7.7)
Neutrophils Relative %: 91 %
Platelets: 59 10*3/uL — ABNORMAL LOW (ref 150–400)
RBC: 3.17 MIL/uL — ABNORMAL LOW (ref 4.22–5.81)
RDW: 21.9 % — ABNORMAL HIGH (ref 11.5–15.5)
WBC: 21.5 10*3/uL — ABNORMAL HIGH (ref 4.0–10.5)
nRBC: 1.4 % — ABNORMAL HIGH (ref 0.0–0.2)

## 2020-02-06 LAB — RENAL FUNCTION PANEL
Albumin: 2.1 g/dL — ABNORMAL LOW (ref 3.5–5.0)
Anion gap: 12 (ref 5–15)
BUN: 31 mg/dL — ABNORMAL HIGH (ref 8–23)
CO2: 23 mmol/L (ref 22–32)
Calcium: 8.1 mg/dL — ABNORMAL LOW (ref 8.9–10.3)
Chloride: 101 mmol/L (ref 98–111)
Creatinine, Ser: 1.32 mg/dL — ABNORMAL HIGH (ref 0.61–1.24)
GFR calc Af Amer: 60 mL/min — ABNORMAL LOW (ref >60)
GFR calc non Af Amer: 52 mL/min — ABNORMAL LOW (ref >60)
Glucose, Bld: 109 mg/dL — ABNORMAL HIGH (ref 70–99)
Phosphorus: 3 mg/dL (ref 2.5–4.6)
Potassium: 4.4 mmol/L (ref 3.5–5.1)
Sodium: 136 mmol/L (ref 135–145)

## 2020-02-06 LAB — CBC
HCT: 27.2 % — ABNORMAL LOW (ref 39.0–52.0)
Hemoglobin: 8.5 g/dL — ABNORMAL LOW (ref 13.0–17.0)
MCH: 28.4 pg (ref 26.0–34.0)
MCHC: 31.3 g/dL (ref 30.0–36.0)
MCV: 91 fL (ref 80.0–100.0)
Platelets: 52 10*3/uL — ABNORMAL LOW (ref 150–400)
RBC: 2.99 MIL/uL — ABNORMAL LOW (ref 4.22–5.81)
RDW: 23 % — ABNORMAL HIGH (ref 11.5–15.5)
WBC: 23.8 10*3/uL — ABNORMAL HIGH (ref 4.0–10.5)
nRBC: 8.4 % — ABNORMAL HIGH (ref 0.0–0.2)

## 2020-02-06 LAB — POCT I-STAT 7, (LYTES, BLD GAS, ICA,H+H)
Acid-base deficit: 1 mmol/L (ref 0.0–2.0)
Acid-base deficit: 4 mmol/L — ABNORMAL HIGH (ref 0.0–2.0)
Acid-base deficit: 9 mmol/L — ABNORMAL HIGH (ref 0.0–2.0)
Bicarbonate: 24.9 mmol/L (ref 20.0–28.0)
Bicarbonate: 22.7 mmol/L (ref 20.0–28.0)
Bicarbonate: 17.8 mmol/L — ABNORMAL LOW (ref 20.0–28.0)
Calcium, Ion: 1.14 mmol/L — ABNORMAL LOW (ref 1.15–1.40)
Calcium, Ion: 1.07 mmol/L — ABNORMAL LOW (ref 1.15–1.40)
Calcium, Ion: 1.02 mmol/L — ABNORMAL LOW (ref 1.15–1.40)
HCT: 31 % — ABNORMAL LOW (ref 39.0–52.0)
HCT: 31 % — ABNORMAL LOW (ref 39.0–52.0)
HCT: 31 % — ABNORMAL LOW (ref 39.0–52.0)
Hemoglobin: 10.5 g/dL — ABNORMAL LOW (ref 13.0–17.0)
Hemoglobin: 10.5 g/dL — ABNORMAL LOW (ref 13.0–17.0)
Hemoglobin: 10.5 g/dL — ABNORMAL LOW (ref 13.0–17.0)
O2 Saturation: 95 %
O2 Saturation: 100 %
O2 Saturation: 98 %
Patient temperature: 35.1
Patient temperature: 37.1
Patient temperature: 38.1
Potassium: 4.3 mmol/L (ref 3.5–5.1)
Potassium: 4.9 mmol/L (ref 3.5–5.1)
Potassium: 5.6 mmol/L — ABNORMAL HIGH (ref 3.5–5.1)
Sodium: 137 mmol/L (ref 135–145)
Sodium: 136 mmol/L (ref 135–145)
Sodium: 135 mmol/L (ref 135–145)
TCO2: 26 mmol/L (ref 22–32)
TCO2: 24 mmol/L (ref 22–32)
TCO2: 19 mmol/L — ABNORMAL LOW (ref 22–32)
pCO2 arterial: 41.1 mmHg (ref 32.0–48.0)
pCO2 arterial: 48.3 mmHg — ABNORMAL HIGH (ref 32.0–48.0)
pCO2 arterial: 42.9 mmHg (ref 32.0–48.0)
pH, Arterial: 7.382 (ref 7.350–7.450)
pH, Arterial: 7.281 — ABNORMAL LOW (ref 7.350–7.450)
pH, Arterial: 7.232 — ABNORMAL LOW (ref 7.350–7.450)
pO2, Arterial: 71 mmHg — ABNORMAL LOW (ref 83.0–108.0)
pO2, Arterial: 369 mmHg — ABNORMAL HIGH (ref 83.0–108.0)
pO2, Arterial: 133 mmHg — ABNORMAL HIGH (ref 83.0–108.0)

## 2020-02-06 LAB — COOXEMETRY PANEL
Carboxyhemoglobin: 1.2 % (ref 0.5–1.5)
Carboxyhemoglobin: 1.3 % (ref 0.5–1.5)
Methemoglobin: 0.6 % (ref 0.0–1.5)
Methemoglobin: 0.8 % (ref 0.0–1.5)
O2 Saturation: 69.2 %
O2 Saturation: 85.5 %
Total hemoglobin: 8.9 g/dL — ABNORMAL LOW (ref 12.0–16.0)
Total hemoglobin: 9.1 g/dL — ABNORMAL LOW (ref 12.0–16.0)

## 2020-02-06 LAB — CULTURE, RESPIRATORY W GRAM STAIN

## 2020-02-06 LAB — BASIC METABOLIC PANEL
Anion gap: 18 — ABNORMAL HIGH (ref 5–15)
BUN: 37 mg/dL — ABNORMAL HIGH (ref 8–23)
CO2: 16 mmol/L — ABNORMAL LOW (ref 22–32)
Calcium: 7.8 mg/dL — ABNORMAL LOW (ref 8.9–10.3)
Chloride: 103 mmol/L (ref 98–111)
Creatinine, Ser: 1.73 mg/dL — ABNORMAL HIGH (ref 0.61–1.24)
GFR calc Af Amer: 43 mL/min — ABNORMAL LOW (ref >60)
GFR calc non Af Amer: 37 mL/min — ABNORMAL LOW (ref >60)
Glucose, Bld: 68 mg/dL — ABNORMAL LOW (ref 70–99)
Potassium: 5.3 mmol/L — ABNORMAL HIGH (ref 3.5–5.1)
Sodium: 137 mmol/L (ref 135–145)

## 2020-02-06 LAB — GLUCOSE, CAPILLARY: Glucose-Capillary: 106 mg/dL — ABNORMAL HIGH (ref 70–99)

## 2020-02-06 LAB — LACTIC ACID, PLASMA
Lactic Acid, Venous: 5.6 mmol/L (ref 0.5–1.9)
Lactic Acid, Venous: 6.4 mmol/L (ref 0.5–1.9)

## 2020-02-06 LAB — MAGNESIUM: Magnesium: 2.6 mg/dL — ABNORMAL HIGH (ref 1.7–2.4)

## 2020-02-06 MED ORDER — SODIUM BICARBONATE-DEXTROSE 150-5 MEQ/L-% IV SOLN
150.0000 meq | INTRAVENOUS | Status: DC
  Administered 2020-02-06 – 2020-02-07 (×2): 150 meq via INTRAVENOUS
  Filled 2020-02-06 (×4): qty 1000

## 2020-02-06 MED ORDER — CALCIUM GLUCONATE-NACL 1-0.675 GM/50ML-% IV SOLN
1.0000 g | Freq: Once | INTRAVENOUS | Status: AC
  Administered 2020-02-06: 1000 mg via INTRAVENOUS
  Filled 2020-02-06: qty 50

## 2020-02-06 MED ORDER — MIDAZOLAM HCL 2 MG/2ML IJ SOLN
INTRAMUSCULAR | Status: AC
  Filled 2020-02-06: qty 2

## 2020-02-06 MED ORDER — FENTANYL BOLUS VIA INFUSION
25.0000 ug | INTRAVENOUS | Status: DC | PRN
  Filled 2020-02-06: qty 25

## 2020-02-06 MED ORDER — POLYETHYLENE GLYCOL 3350 17 G PO PACK
17.0000 g | PACK | Freq: Every day | ORAL | Status: DC
  Administered 2020-02-06 – 2020-02-07 (×2): 17 g via ORAL
  Filled 2020-02-06 (×2): qty 1

## 2020-02-06 MED ORDER — DEXTROSE 50 % IV SOLN
INTRAVENOUS | Status: AC
  Administered 2020-02-06: 50 mL via INTRAVENOUS
  Filled 2020-02-06: qty 50

## 2020-02-06 MED ORDER — CALCIUM GLUCONATE-NACL 1-0.675 GM/50ML-% IV SOLN
INTRAVENOUS | Status: AC
  Administered 2020-02-06: 1000 mg via INTRAVENOUS
  Filled 2020-02-06: qty 50

## 2020-02-06 MED ORDER — SODIUM BICARBONATE 8.4 % IV SOLN
INTRAVENOUS | Status: AC
  Administered 2020-02-06: 50 meq via INTRAVENOUS
  Filled 2020-02-06: qty 50

## 2020-02-06 MED ORDER — SODIUM CHLORIDE 0.9 % IV BOLUS
500.0000 mL | Freq: Once | INTRAVENOUS | Status: AC
  Administered 2020-02-06: 500 mL via INTRAVENOUS

## 2020-02-06 MED ORDER — EPINEPHRINE 1 MG/10ML IJ SOSY
PREFILLED_SYRINGE | INTRAMUSCULAR | Status: AC
  Filled 2020-02-06: qty 20

## 2020-02-06 MED ORDER — FENTANYL CITRATE (PF) 100 MCG/2ML IJ SOLN
25.0000 ug | INTRAMUSCULAR | Status: DC | PRN
  Administered 2020-02-08: 12.5 ug via INTRAVENOUS

## 2020-02-06 MED ORDER — SODIUM CHLORIDE 0.9 % IV SOLN
1.2500 ng/kg/min | INTRAVENOUS | Status: AC
  Administered 2020-02-06: 40 ng/kg/min via INTRAVENOUS
  Administered 2020-02-06: 5 ng/kg/min via INTRAVENOUS
  Administered 2020-02-07: 30 ng/kg/min via INTRAVENOUS
  Administered 2020-02-08: 40 ng/kg/min via INTRAVENOUS
  Filled 2020-02-06 (×5): qty 1

## 2020-02-06 MED ORDER — HYDROCORTISONE NA SUCCINATE PF 100 MG IJ SOLR
100.0000 mg | Freq: Three times a day (TID) | INTRAMUSCULAR | Status: DC
  Administered 2020-02-06 – 2020-02-08 (×8): 100 mg via INTRAVENOUS
  Filled 2020-02-06 (×8): qty 2

## 2020-02-06 MED ORDER — FLUDROCORTISONE 0.1 MG/ML ORAL SUSPENSION: 0.1000 mg | Freq: Every day | ORAL | Status: DC

## 2020-02-06 MED ORDER — FENTANYL 2500MCG IN NS 250ML (10MCG/ML) PREMIX INFUSION: 25.0000 ug/h | INTRAVENOUS | Status: DC

## 2020-02-06 MED ORDER — FLUDROCORTISONE ACETATE 0.1 MG PO TABS
0.1000 mg | ORAL_TABLET | Freq: Every day | ORAL | Status: DC
  Administered 2020-02-06 – 2020-02-07 (×2): 0.1 mg via ORAL
  Filled 2020-02-06 (×2): qty 1

## 2020-02-06 MED ORDER — FENTANYL CITRATE (PF) 100 MCG/2ML IJ SOLN
INTRAMUSCULAR | Status: AC
  Filled 2020-02-06: qty 2

## 2020-02-06 MED ORDER — CALCIUM GLUCONATE-NACL 1-0.675 GM/50ML-% IV SOLN: 1.0000 g | Freq: Once | INTRAVENOUS | Status: AC

## 2020-02-06 MED ORDER — ORAL CARE MOUTH RINSE
15.0000 mL | OROMUCOSAL | Status: DC
  Administered 2020-02-06 – 2020-02-08 (×18): 15 mL via OROMUCOSAL

## 2020-02-06 MED ORDER — EPINEPHRINE HCL 5 MG/250ML IV SOLN IN NS
0.5000 ug/min | INTRAVENOUS | Status: DC
  Administered 2020-02-06: 20 ug/min via INTRAVENOUS
  Administered 2020-02-06: 28 ug/min via INTRAVENOUS
  Administered 2020-02-06: 30 ug/min via INTRAVENOUS
  Administered 2020-02-06: 0.5 ug/min via INTRAVENOUS
  Administered 2020-02-07: 14 ug/min via INTRAVENOUS
  Administered 2020-02-07: 17 ug/min via INTRAVENOUS
  Administered 2020-02-07 (×3): 13 ug/min via INTRAVENOUS
  Administered 2020-02-08: 28 ug/min via INTRAVENOUS
  Administered 2020-02-08: 20 ug/min via INTRAVENOUS
  Filled 2020-02-06 (×3): qty 250
  Filled 2020-02-06: qty 500
  Filled 2020-02-06 (×8): qty 250

## 2020-02-06 MED ORDER — FENTANYL CITRATE (PF) 100 MCG/2ML IJ SOLN: 25.0000 ug | Freq: Once | INTRAMUSCULAR | Status: DC

## 2020-02-06 MED ORDER — DEXTROSE 50 % IV SOLN: 50.0000 mL | Freq: Once | INTRAVENOUS | Status: AC

## 2020-02-06 MED ORDER — SODIUM CHLORIDE 0.9 % IV BOLUS
250.0000 mL | Freq: Once | INTRAVENOUS | Status: AC
  Administered 2020-02-06: 250 mL via INTRAVENOUS

## 2020-02-06 MED ORDER — FENTANYL CITRATE (PF) 100 MCG/2ML IJ SOLN
25.0000 ug | INTRAMUSCULAR | Status: DC | PRN
  Filled 2020-02-06: qty 2

## 2020-02-06 MED ORDER — CHLORHEXIDINE GLUCONATE 0.12% ORAL RINSE (MEDLINE KIT)
15.0000 mL | Freq: Two times a day (BID) | OROMUCOSAL | Status: DC
  Administered 2020-02-06 – 2020-02-08 (×4): 15 mL via OROMUCOSAL

## 2020-02-06 MED ORDER — SODIUM BICARBONATE 8.4 % IV SOLN: 50.0000 meq | Freq: Once | INTRAVENOUS | Status: AC

## 2020-02-06 NOTE — Progress Notes (Addendum)
Patient ID: Larry [name], male   DOB: 03/17/1941, 79 y.o.   MRN: [id number]    Advanced Heart Failure Rounding Note  Patient Profile  Admitted for large secundum ASD closure.   Developed acute pulmonary edema with respiratory failure and worsening renal function.  Underwent placement of a Swan-Ganz catheter and temporary dialysis catheter.  Hospital course further c/b shock, felt to be vasodilatory/septic w/ high pressor support. Also w/ recurrent GIB w/ anemia requiring transfusion.    Subjective:     Only able to pull 30 ml/hr on CVVHD last night due to low MAPs. Increased pressor requirements overnight, now on NE 54 + VP 0.03. MAP 61. CVVHD now off. CVP 6. PCWP 22.   Respiratory status worse. Critical care at bedside and planing to intubate.   Hgb trending up after transfusion, 7.7>>9.2>>10.5   Plt ct trending down, 95>>87>>59   Swan #s CVP 6 PA 53/29 Thermo CI 2.4  PCWP 22 SVR 749   Wife and daughter at bedside.   Echo (4/21): EF 60-65%, RV looked relatively normal, ASD closure device intact, dilated IVC  Objective:   Weight Range:  Vital Signs:   Temp:  [94.5 F (34.7 C)-96.4 F (35.8 C)] 96.4 F (35.8 C) (04/30 0730) Pulse Rate:  [31-90] 69 (04/30 0730) Resp:  [14-32] 23 (04/30 0730) BP: (84-117)/(47-85) 84/53 (04/30 0730) SpO2:  [70 %-100 %] 91 % (04/30 0730) Arterial Line BP: (74-254)/(35-250) 79/40 (04/30 0730) Last BM Date: 02/04/20  Weight change: Filed Weights   02/03/20 0500 02/04/20 0500 02/05/20 0548  Weight: 84.9 kg 85.8 kg 84.7 kg    Intake/Output:   Intake/Output Summary (Last 24 hours) at 02/06/2020 0743 Last data filed at 02/06/2020 0700 Gross per 24 hour  Intake 2287.79 ml  Output 3531 ml  Net -1243.21 ml     Physical Exam: General: Critically ill, fatigue/ frail appearing w/ increased respiratory difficulty    HEENT: normal Neck: supple. no JVD. Carotids 2+ bilat; no bruits. No lymphadenopathy or thryomegaly appreciated. LIJ HD  catheter RIJ swan  Cor: PMI nondisplaced. Regular rate & rhythm. No rubs, gallops or murmurs. Lungs: bilateral crackles  Abdomen: soft, nontender, nondistended. No hepatosplenomegaly. No bruits or masses. Good bowel sounds. Extremities: no cyanosis, clubbing, rash, R and LLE 2+ edema up to thighs  Neuro: awake but less responsive today     Telemetry: NSR mid 70s  Labs: Basic Metabolic Panel: Recent Labs  Lab 02/02/20 0515 02/02/20 1407 02/03/20 0525 02/04/20 0125 02/04/20 0257 02/04/20 0257 02/04/20 1622 02/04/20 1622 02/05/20 0408 02/05/20 1558 02/06/20 0323 02/06/20 0329  NA 129*   < > 128*   < > 126*   < > 128*  --  134* 135 136 137  K 3.1*   < > 3.7   < > 4.4   < > 4.5  --  4.6 4.9 4.4 4.3  CL 85*   < > 85*  --  86*  --  90*  --  99 100 101  --   CO2 29   < > 28  --  21*  --  23  --  21* 19* 23  --   GLUCOSE 171*   < > 147*  --  146*  --  149*  --  132* 106* 109*  --   BUN 87*   < > 96*  --  110*  --  92*  --  60* 44* 31*  --   CREATININE 3.07*   < > 3.32*  --  3.60*  --  3.00*  --  2.18* 1.58* 1.32*  --   CALCIUM 8.3*   < > 8.4*  --  8.6*   < > 8.4*   < > 8.4* 7.6* 8.1*  --   MG 2.3  --  1.9  --   --   --   --   --  2.5*  --  2.6*  --   PHOS  --   --   --   --   --   --  4.6  --  3.9 3.6 3.0  --    < > = values in this interval not displayed.    Liver Function Tests: Recent Labs  Lab 02/04/20 1622 02/05/20 0408 02/05/20 1558 02/06/20 0323  ALBUMIN 2.2* 2.1* 2.1* 2.1*   No results for input(s): LIPASE, AMYLASE in the last 168 hours. No results for input(s): AMMONIA in the last 168 hours.  CBC: Recent Labs  Lab 02/03/20 0525 02/04/20 0125 02/04/20 0257 02/04/20 0257 02/04/20 2305 02/05/20 0408 02/05/20 1558 02/06/20 0323 02/06/20 0329  WBC 15.0*  --  17.2*  --  21.0* 20.7*  --  21.5*  --   NEUTROABS 13.1*  --  15.1*  --   --  18.6*  --  19.4*  --   HGB 7.5*   < > 7.9*   < > 8.8* 7.7* 9.1* 9.2* 10.5*  HCT 23.1*   < > 23.1*   < > 26.3* 23.4* 28.0*  27.7* 31.0*  MCV 89.5  --  87.8  --  88.3 88.6  --  87.4  --   PLT 77*  --  87*  --  95* 87*  --  59*  --    < > = values in this interval not displayed.    Cardiac Enzymes: No results for input(s): CKTOTAL, CKMB, CKMBINDEX, TROPONINI in the last 168 hours.  BNP: BNP (last 3 results) Recent Labs    12/19/19 1404 01/15/20 1422 01/19/2020 1106  BNP 192.2* 607.6* 573.5*    ProBNP (last 3 results) No results for input(s): PROBNP in the last 8760 hours.    Other results:  Imaging: DG CHEST PORT 1 VIEW  Result Date: 02/04/2020 CLINICAL DATA:  Shortness of breath. EXAM: PORTABLE CHEST 1 VIEW COMPARISON:  February 03, 2020. FINDINGS: Stable cardiomediastinal silhouette. No pneumothorax is noted. Stable position of left internal jugular catheter in right internal jugular Swan-Ganz catheter. Atherosclerosis of thoracic aorta is noted. Stable bilateral lung opacities are noted consistent with pulmonary edema or pneumonia. Stable mild right pleural effusion is noted. Bony thorax is unremarkable. IMPRESSION: Stable bilateral lung opacities are noted consistent with pulmonary edema or pneumonia. Stable mild right pleural effusion is noted. Aortic Atherosclerosis (ICD10-I70.0). Electronically Signed   By: Marijo Conception M.D.   On: 02/04/2020 10:27     Medications:     Scheduled Medications: . sodium chloride   Intravenous Once  . Benzocaine   Mouth/Throat Once  . Chlorhexidine Gluconate Cloth  6 each Topical Daily  . docusate sodium  100 mg Oral BID  . feeding supplement (ENSURE ENLIVE)  237 mL Oral TID BM  . mouth rinse  15 mL Mouth Rinse BID  . midodrine  15 mg Oral TID WC  . multivitamin  1 tablet Oral QHS  . pantoprazole (PROTONIX) IV  40 mg Intravenous Q12H  . pravastatin  40 mg Oral QPM  . sodium chloride flush  10-40 mL Intracatheter Q12H  . sodium chloride flush  3 mL Intravenous Q12H    Infusions: .  prismasol BGK 4/2.5 400 mL/hr at 02/06/20 4196  .  prismasol BGK 4/2.5 300  mL/hr at 02/05/20 0519  . sodium chloride    . amiodarone 30 mg/hr (02/06/20 0700)  . ceFEPime (MAXIPIME) IV Stopped (02/05/20 2334)  . linezolid (ZYVOX) IV Stopped (02/05/20 2302)  . norepinephrine (LEVOPHED) Adult infusion 54 mcg/min (02/06/20 0700)  . prismasol BGK 4/2.5 1,800 mL/hr at 02/06/20 0504  . vasopressin (PITRESSIN) infusion - *FOR SHOCK* 0.03 Units/min (02/06/20 0742)    PRN Medications: Place/Maintain arterial line **AND** sodium chloride, acetaminophen, bisacodyl, heparin, nitroGLYCERIN, ondansetron (ZOFRAN) IV, oxyCODONE, polyethylene glycol, Resource ThickenUp Clear, sodium chloride flush   Assessment.Plan:   1. Acute hypoxic respiratory failure with acute pulmonary edema and R pleural effusion: Due to pulmonary edema s/p ASD closure. S/p right thoracentesis 4/25, transudative (CHF).  CXR 4/28 with diffuse airspace disease. WBCs up to 21=> ?component of PNA.  Poor diuresis with IV Lasix.  - Requiring CVVH for volume removal, but now on hold due to low BP despite high pressor support.   - Covering empirically for PNA (cefepime).    - Critical Care planning intubation today 2. Shock: SVR is low, cardiac output is preserved (CI 2.4 off Swan).  He is requiring NE 56.  This appears to be primarily vasodilatory shock, ?septic shock though no fever.  WBCs up to 20  - As above, will cover empirically with abx  - Procalcitonin 4.28.  - Continue NE + vasopressin 3. Large secundum ASD s/p closure: s/p successful transcatheter closure of a large secundum ASD using a 26 mm Amplatzer septal occluder deviceon 01/17/2020.  Post op limited echo showed EF 60-65% with well seated ASD closure device with no residual flow. 4. Acute on chronic diastolic CHF with prominent RV failure: Still with predominant RV failure by Luiz Blare in setting of long-standing ASD, but also pulmonary edema after ASD closure.  Still with large oxygen requirement and CXR with diffuse airspace disease/pulmonary edema  yesterday am. - Maps remain soft, currently on NE 54 + vasopressin 0.03 units,  and midodrine 15 mg tid. - Cardiac output 5.0.    - Started CVVHD on 4/28. Only able to pull 30/hr overnight due to low BP, requiring increased pressor support, currently on hold.  4. Acute on CKD stage IV: Creatinine baseline 2.1- 2.5.  - Nephrology consulted. Started on CVVHD on 4/28. On hold temporarily as outlined above     5. Paroxysmal aifb/flutter: developed recurrent AF on 4/24.  Eliquis stopped 4/24 due to oozing at line site with falling hgb, restarted 4/27.  - In NSR today.  - Continue amiodarone 30 mg/hr.  - Hold apixaban for now with recurrent GI bleeding.      6. CAD: s/p remotePCI to RCA, had stent thrombosis back in 2000.No chest pain.  - Pravastatin.  - No ASA with Eliquis use.  7. History of GI bleed with chronic anemia: hgb 12.6 -> 9.4 -> 8.8 -> 8.6 -> 8 -> 7.5 -> 7.9 ->7.7 4/28 FOBT +  - Transfuse 1 unit PRBCs yesterday. Hgb up to 10.5 today  8. ID: Afebrile,  WBC 15.2 -> 13.7 -> 14 -> 15 -> 17-> 20->21K .   SVR is low suggesting vasodilatory shock, ?septic. CXR cannot rule out component of PNA along with pulmonary edema.  - Procalcitonin 4.3 - Cultures obtained 4/28  blood, sputum, urine.  - Empiric cefepime.  9. Hyponatremia: Hypervolemic hyponatremia.  - Sodium up to  137.  10. Thrombocytopenia: Suspect related to acute medical illness, plts 59k.  No heparin. - Stop eliquis as above.   - suspect likely 2/2 critical illness  11. Hematuria: Had difficult foley placement.  Having some hematuria, will follow for now with Eliquis continued.  This seems to be improving.   Length of Stay: [place] PA-C 02/06/2020, 7:43 AM  Advanced Heart Failure Team Pager [phone] (M-F; 7a - 4p)  Please contact [facility] for night-coverage after hours (4p -7a ) and weekends on [email or web address]  Patient seen with NP, agree with the above note.   Patient intubated this morning with  progressive respiratory failure.  WBCs 21, afebrile.  He remains on cefepime with Citrobacter freundii in sputum culture.   He has been on CVVH but held with sedation/intubation and marginal BP.  Now on NE 80, vasopressin 0.03, midodrine 15 mg tid. Giopreza has been started.  CVP 6 today, CI 2.4, SVR 749.    Hgb up to 9.2 but plts continue to fall (59 today).    He remains in NSR on amiodarone gtt.   General: Intubated Neck: No JVD, no thyromegaly or thyroid nodule.  Lungs: Decreased BS at bases bilaterally.  CV: Nondisplaced PMI.  Heart regular S1/S2, no S3/S4, no murmur.  1+ edema to knees.   Abdomen: Soft, nontender, no hepatosplenomegaly, no distention.  Skin: Intact without lesions or rashes.  Neurologic: Sedated Extremities: No clubbing or cyanosis.  HEENT: Normal.   Acute respiratory failure now with intubation.  Suspect HCAP with septic shock, also with pulmonary edema post- ASD closure.  However, hemodynamics this morning with CVP 6 and PADP 19 as well as good cardiac output and SVR 749 are most suggestive of septic shock.   - Will need to resume CVVH, running even to slightly negative with soft BP and low CVP.  - Support MAP with NE, vasopressin, and Giapreza.   - Citrobacter freundii in sputum, covered effectively with cefepime.   He is in NSR on amiodarone gtt, off anticoagulation with falling plts and GI bleeding.   Low plts likely due to sepsis/inflammation.   Patient remains critically ill with multisystem organ failure.  Family updated.   CRITICAL CARE Performed by: [name]  Total critical care time: 45 minutes  Critical care time was exclusive of separately billable procedures and treating other patients.  Critical care was necessary to treat or prevent imminent or life-threatening deterioration.  Critical care was time spent personally by me on the following activities: development of treatment plan with patient and/or surrogate as well as nursing,  discussions with consultants, evaluation of patient's response to treatment, examination of patient, obtaining history from patient or surrogate, ordering and performing treatments and interventions, ordering and review of laboratory studies, ordering and review of radiographic studies, pulse oximetry and re-evaluation of patient's condition.   [name] 02/06/2020 8:54 AM

## 2020-02-06 NOTE — Progress Notes (Signed)
RT attempted NTS in both nares. Patient unable to cough effectively. Some small amount of bloody secretions were suctioned. Patient placed back on HFNC. RT will monitor as needed.

## 2020-02-06 NOTE — Progress Notes (Signed)
Hypoglycemic Event  CBG: 68  Treatment: D50 50 mL (25 gm)  Symptoms: None  CBG Result:106  Possible Reasons for Event: Other: NPO  Comments/MD notified:MD aware; protocol followed    [name]

## 2020-02-06 NOTE — Progress Notes (Signed)
[facility] Progress Note  Name: Larry [name] MRN: [id number] DOB: [birth year]  Chief Complaint:  Presented for ASD repair  Subjective:  Seen on CRRT and procedure supervised.  Goal has been 50 to 100 an hour as tolerated.  At one point nursing states CVP registered as low as 5 - he did not feel this was accurate. Had 3.3 liters UF over 4/29 reporting period as charted thus far with CRRT.  Levo was increased to 52 mcg/min (from 32) and has been on vaso.  Got a unit of PRBC's yesterday.  Has been on high flow nasal cannula - nursing was worried re: mental status for bipap.    Review of systems:    Doesn't readily answer questions this AM - unable to reliably obtain   Intake/Output Summary (Last 24 hours) at 02/06/2020 0600 Last data filed at 02/06/2020 0500 Gross per 24 hour  Intake 2192.69 ml  Output 3452 ml  Net -1259.31 ml    Vitals:  Vitals:   02/06/20 0415 02/06/20 0430 02/06/20 0445 02/06/20 0500  BP:      Pulse: 74 75 75 79  Resp: (!) 25 (!) 25 (!) 23 (!) 23  Temp: (!) 95.4 F (35.2 C) (!) 95.4 F (35.2 C) (!) 95.7 F (35.4 C) (!) 95.7 F (35.4 C)  TempSrc:      SpO2: 97% 98% 97% 96%  Weight:      Height:         Physical Exam:    General adult male in bed on 12 liters high flow  HEENT normocephalic atraumatic extraocular movements intact sclera anicteric Neck supple trachea midline Lungs rhonchi on auscultation and 12 liters high flow  Heart RRR on pressors Abdomen soft nontender nondistended Extremities 2-3+ pitting edema  Neuro - awake and intermittently will follow commands Left IJ nontunneled catheter   Medications reviewed   Labs:  BMP Latest Ref Rng & Units 02/06/2020 02/06/2020 02/05/2020  Glucose 70 - 99 mg/dL - 109(H) 106(H)  BUN 8 - 23 mg/dL - 31(H) 44(H)  Creatinine 0.61 - 1.24 mg/dL - 1.32(H) 1.58(H)  BUN/Creat Ratio 10 - 24 - - -  Sodium 135 - 145 mmol/L 137 136 135  Potassium 3.5 - 5.1 mmol/L 4.3 4.4 4.9  Chloride 98 - 111  mmol/L - 101 100  CO2 22 - 32 mmol/L - 23 19(L)  Calcium 8.9 - 10.3 mg/dL - 8.1(L) 7.6(L)    Assessment/Plan:   # Acute kidney injury  - with prerenal and ischemic insults in the setting of shock with overload and hypotension.  Left IJ nontunneled catheter was placed and started CRRT 4/28.   - 4K fluids  - reduce back to 50 ml/hr off as tolerated for now; pressor requirement has gone up. Please page nephrology for any CRRT changes  # Fluid volume overload - Optimize volume with CRRT as above  # Acute hypoxic respiratory failure - On supplemental oxygen.   - Optimize volume status with CRRT  # Septic shock - Pressors and antibiotics per primary team  # Status post ASD repair per cardiology  # Hyponatremia - Setting of overload.  Also has been on Lasix and diminishing response.  Follow with CRRT - resolved  # Iron deficiency anemia - Transfusions per primary team - note report of dark stools  - s/p aranesp 40 mcg once 4/29   # CKD stage III  - baseline Cr is 1.84 per 11/2019 but rose to 2 - 2.4 in March 2021 with  possible progression to stage IV at that time  Claudia Desanctis, MD 02/06/2020 6:15 AM

## 2020-02-06 NOTE — Progress Notes (Signed)
Patient ID: Larry [name], male   DOB: 04/09/1941, 79 y.o.   MRN: [id number]  Patient has steadily worsened through the day with multisystem organ failure.  He is now on high doses of multiple pressors, hydrocortisone, and HCO3 infusion.  He is back on CVVH, running even.  MAP is marginal still, SBP around 90.   Severe septic shock, cardiac output has been preserved through his course with low SVR. He is getting linezolid and cefepime, Citrobacter freundii in sputum.  Suspect HCAP.   We are at maximal support at this point, discussed with family.  If he worsens any further, we will unfortunately not have anything to offer.  His wife understands and does not want him to suffer but wants to keep aggressive treatment going as long as there is any hope.   Will send CBC and co-ox now.  Continue current therapies.   [name] 02/06/2020 4:27 PM

## 2020-02-06 NOTE — Progress Notes (Addendum)
Nutrition Follow-up  DOCUMENTATION CODES:   Severe malnutrition in context of chronic illness  INTERVENTION:   Tube Feeding va Cortrak (once hemodynamically stable): Vital 1.2 at 50 ml/hr Plan to initiate at 20 ml/hr and titrate by 10 mL q 12 hours until goal rate Pro-Stat 30 mL TID Provides 135 g of protein, 1740 kcals and 972 mL of free water Meets 100% estimated calorie and protein needs  Once TF initiated, monitor magnesium, potassium, and phosphorus for at least 5 occurences, MD to replete as needed, as pt is at risk for refeeding syndrome given severe malnutrition, prolonged poor po intake  Continue Rena-Vit daily   NUTRITION DIAGNOSIS:   Severe Malnutrition related to chronic illness as evidenced by severe muscle depletion, severe fat depletion, edema.  Being addressed via TF   GOAL:   Patient will meet greater than or equal to 90% of their needs  Progressing  MONITOR:   PO intake, Supplement acceptance, Labs, Weight trends  REASON FOR ASSESSMENT:   Rounds    ASSESSMENT:   79 yo male admitted for closure of 3cm ASD, developed acute respiratory failure with pulmonary edema and AKI on CKD IV requiring CRRT. PMH includes CKD IV, HTN, MI, CAD  4/22 ASD closure 4/24 Worsening renal and respiraatory function, R. Heart cath with Swan-Ganz placement, HD cath placement 4/25 R. Thoracentesis with 1.7 L removed 4/28 CRRT initiated 4/20 Intubated  Progressive hypotension and lethargy overnight, minimally responsive this AM CRRT pulling only 30 ml/hr due to hypotension, on hold currently but plans to restart as soon as hemodynamically stable  MAP via a-line <65 overnight, currently 67, requiring increasing dose of levophed to maintain MAP, on vasopressin as well Patient is currently intubated on ventilator support MV: 10.7 L/min Temp (24hrs), Avg:95.2 F (35.1 C), Min:94.5 F (34.7 C), Max:96.4 F (35.8 C)  Propofol: None  Hgb stable post transfusions,  currently Hgb 10.5  Cortrak to be placed today  Current weight 84.7 kg; net negative 2.6 L. Admit weight 86.7 kg. Pt still with significant edema on exam (4+ bilateral LE and 3+ generalized). Unsure of actual EDW. Noted weight around 77-78 kg in Feb and March of this year. Plan to utilize 77 kg as EDW at present   Pt with previously documented DTI to foot, now with DTI to sacrum  Labs: potassium wdl, phosphorus wdl Meds: midodrine, Rena-Vit   Diet Order:   Diet Order            Diet NPO time specified  Diet effective now              EDUCATION NEEDS:   Education needs have been addressed  Skin:  Skin Assessment: Skin Integrity Issues: Skin Integrity Issues:: DTI DTI: foot (4/26), sacrum (4/29)  Last BM:  4/29  Height:   Ht Readings from Last 1 Encounters:  02/06/20 6' (1.829 m)    Weight:   Wt Readings from Last 1 Encounters:  02/05/20 84.7 kg   BMI:  Body mass index is 25.33 kg/m.  Estimated Nutritional Needs:   Kcal:  1775 kcals  Protein:  120-150 g  Fluid:  1.5 L   Kerman Passey MS, RDN, LDN, CNSC RD Pager Number and RD On-Call Pager Number Located in [place]

## 2020-02-06 NOTE — Plan of Care (Signed)
Decreasing vasopressors since bicarb infusion started. He has started to be more alert per his sons, who are at bedside. Still not requiring sedation.  ABG at midnight to determine if bicarb can be decreased or stopped.  Julian Hy, DO 02/06/20 8:00 PM [facility]

## 2020-02-06 NOTE — Procedures (Signed)
Cortrak  Tube Type:  Cortrak - 43 inches Tube Location:  Left nare Initial Placement:  Stomach Secured by: Bridle Technique Used to Measure Tube Placement:  Documented cm marking at nare/ corner of mouth Cortrak Secured At:  80 cm    Cortrak Tube Team Note:  Consult received to place a Cortrak feeding tube.   No x-ray is required. RN may begin using tube.   If the tube becomes dislodged please keep the tube and contact the Cortrak team at [email or web address] ([email or web address]) for replacement.  If after hours and replacement cannot be delayed, place a NG tube and confirm placement with an abdominal x-ray.    Koleen Distance MS, RD, LDN Please refer to [facility] for RD and/or RD on-call/weekend/after hours pager

## 2020-02-06 NOTE — Progress Notes (Signed)
NAME:  Larry [name], MRN:  [id number], DOB:  11-06-1940, LOS: 7 ADMISSION DATE:  02/06/2020, CONSULTATION DATE:  4/25 REFERRING MD:  Bensimhon, CHIEF COMPLAINT:  Pleural effusion   Brief History   Chronic right heart failure due to ASD S/p ASD closure, now with acute respiratory failure due to acute pulmonary edema. Requiring BiPAP.  History of present illness   Mr. Larry [name] is a 79 y/o gentleman admitted for closure of a 3cm ASD diagnosed during evaluation of right heart failure. He underwent ASD closure on 4/22.  He went into acute respiratory failure with worsening renal function on 4/24.  He had Swan-Ganz catheter, dialysis catheter placed to guide treatment.  He has had good response to diuresis.  He was able to come off BiPAP 4/23, but today required BiPAP again on 4/25. On BiPAP SOB was controlled and improved after thoracentesis on 4/25 yielding 1.7L of transudative fluid. PCCM signed off 4/26 and re-consulted 4/28 due to continued BiPAP requirements and concern for possible sepsis given CXR showing Edema vs Airspace disease with increased WBC and elevated Procalcitonin.  Past Medical History  CKD ASD CAD HLD HTN HFpEF w/ RHF  Significant Hospital Events   4/22 ASD closure 4/24 Right heart cath with Swan-Ganz placement, dialysis catheter placement 4/25 R Thoracentesis (-1/7L Transudate)  Consults:  PCCM Cardiology Nephro  Procedures:  4/22 ASD closure 4/24 Right heart cath with Swan-Ganz placement, dialysis catheter placement 4/25 R Thoracentesis (-1/7L Transudate)  Significant Diagnostic Tests:  CXR (4/25): Decreased RIGHT pleural effusion and improved RIGHT LOWER lung aeration. No evidence of pneumothorax.  CXR (4/26): Generalized pulmonary opacity with interval progression. Stable small right pleural effusion.  CXR (4/28): Stable bilateral lung opacities are noted consistent with pulmonary edema or pneumonia. Stable mild right pleural effusion is noted.  Micro  Data:  4/26 Pleural fluid: > WBC cells, no organisms, culture NG 4/28 Blood Cx: Pending 4/28 Urine Cx: NG 4/28 Sputum Cx: moderate GNRs  Antimicrobials:  Cefazolin 4/21 Linezolid 4/28 >> Cefepime 4/28 >>  Interim history/subjective:  Progressive hypotension and lethargy overnight.  Not answering questions this morning. Barely responding to family.  Objective   Blood pressure (!) 84/53, pulse 69, temperature (!) 96.4 F (35.8 C), resp. rate (!) 23, height 6' (1.829 m), weight 84.7 kg, SpO2 91 %. PAP: (39-53)/(11-26) 48/19 CVP:  [4 mmHg-16 mmHg] 6 mmHg PCWP:  [22 mmHg] 22 mmHg CO:  [4.7 L/min-5.9 L/min] 5.9 L/min CI:  [2.3 L/min/m2-2.9 L/min/m2] 2.9 L/min/m2  Vent Mode: PRVC FiO2 (%):  [100 %] 100 % Set Rate:  [20 bmp] 20 bmp Vt Set:  [620 mL] 620 mL PEEP:  [8 cmH20] 8 cmH20 Plateau Pressure:  [38 cmH20] 38 cmH20   Intake/Output Summary (Last 24 hours) at 02/06/2020 0848 Last data filed at 02/06/2020 0700 Gross per 24 hour  Intake 2229.82 ml  Output 3428 ml  Net -1198.18 ml   Filed Weights   02/03/20 0500 02/04/20 0500 02/05/20 0548  Weight: 84.9 kg 85.8 kg 84.7 kg    Examination:  General: Critically ill-appearing man lying in bed, lethargic, not responding to verbal stimulation HEENT: [place]/AT, eyes anicteric.  Oral mucosa dry. Cardio: Regular rate and rhythm Resp: Tachypneic, using some accessory muscles.  No significant ability to cough.  Rhonchi diffusely bilaterally. Abdomen: Diffusely tender to palpation, soft Extremities: Anasarca of lower extremities, no clubbing or cyanosis Neuro: Much less interactive than previous exams, only opened his eyes briefly to his daughters voice.  Able to squeeze hands on both  sides. Derm: Pallor, bruising on upper extremities.  No rashes.  CXR 4/30 personally reviewed-ET tube about 1 cm below the clavicles, OG tube in appropriate position.  Persistent right pleural effusion, right lung opacification greater than left.  Resolved  Hospital Problem list     Assessment & Plan:  HAP- GNR Acute hypoxic respiratory failure requiring mechanical ventilation Acute pulmonary edema Right-sided transudative pleural effusion -Required intubation mechanical ventilation today.  Discussed with his family prior to intubation.  They understand that vent weaning may be a prolonged process, potentially lasting weeks to months given his severe weakness and debility. Low tidal volume ventilation, 48 cc/kg ideal body weight with goal driving pressure less than 15 and plateau less than 30.  Titrate PEEP and FiO2 per ARDS protocol. -Daily SAT and SBT when appropriate. -Continue chest PT every 4 hours -Continue broad-spectrum antibiotics -Continue to follow sputum culture. -May require repeat thoracentesis versus chest tube placement to reevaluate right pleural effusion for possible evolution into parapneumonic effusion  Shock-likely septic, less consistent with cardiogenic -Continue vasopressors as required to maintain MAP greater than 65.  Remains on high-dose norepinephrine, vasopressin, added Lopressor today.  Adding stress to steroids. -Continue broad-spectrum antibiotics  Afib, CAD, ASD s/p closure, A/C dHF with RHF -We will restart CRRT for volume removal once hemodynamically stable. -Continue vasopressors.  Hypervolemic hyponatremia due to heart failure and kidney injury AGMA 2/2 Uremia AKI on CKD: Now on CRRT -CRRT per nephrology; restart once hemodynamically stable  Likely aspiration  -Eventually will need swallow evaluation post extubation  Acute blood loss anemia- possibly lower GI bleed while on anticoagulation -Agree with holding anticoagulation -Continue high-dose PPI -Supportive care -Continue to monitor  Thrombocytopenia possibly due to acute illness, less likely due to medications-worsening. 4T HIT score= 5. -Continue to monitor -no current indication for transfusion   Daughter and wife updated at bedside.    Best practice:  Per primary Disposition: ICU  Labs   CBC: Recent Labs  Lab 02/03/20 0525 02/04/20 0125 02/04/20 0257 02/04/20 0257 02/04/20 2305 02/05/20 0408 02/05/20 1558 02/06/20 0323 02/06/20 0329  WBC 15.0*  --  17.2*  --  21.0* 20.7*  --  21.5*  --   NEUTROABS 13.1*  --  15.1*  --   --  18.6*  --  19.4*  --   HGB 7.5*   < > 7.9*   < > 8.8* 7.7* 9.1* 9.2* 10.5*  HCT 23.1*   < > 23.1*   < > 26.3* 23.4* 28.0* 27.7* 31.0*  MCV 89.5  --  87.8  --  88.3 88.6  --  87.4  --   PLT 77*  --  87*  --  95* 87*  --  59*  --    < > = values in this interval not displayed.    Basic Metabolic Panel: Recent Labs  Lab 02/02/20 0515 02/02/20 1407 02/03/20 0525 02/04/20 0125 02/04/20 0257 02/04/20 0257 02/04/20 1622 02/05/20 0408 02/05/20 1558 02/06/20 0323 02/06/20 0329  NA 129*   < > 128*   < > 126*   < > 128* 134* 135 136 137  K 3.1*   < > 3.7   < > 4.4   < > 4.5 4.6 4.9 4.4 4.3  CL 85*   < > 85*  --  86*  --  90* 99 100 101  --   CO2 29   < > 28  --  21*  --  23 21* 19* 23  --  GLUCOSE 171*   < > 147*  --  146*  --  149* 132* 106* 109*  --   BUN 87*   < > 96*  --  110*  --  92* 60* 44* 31*  --   CREATININE 3.07*   < > 3.32*  --  3.60*  --  3.00* 2.18* 1.58* 1.32*  --   CALCIUM 8.3*   < > 8.4*  --  8.6*  --  8.4* 8.4* 7.6* 8.1*  --   MG 2.3  --  1.9  --   --   --   --  2.5*  --  2.6*  --   PHOS  --   --   --   --   --   --  4.6 3.9 3.6 3.0  --    < > = values in this interval not displayed.   GFR: Estimated Creatinine Clearance: 51.4 mL/min (A) (by C-G formula based on SCr of 1.32 mg/dL (H)). Recent Labs  Lab 02/04/20 0257 02/04/20 0759 02/04/20 2305 02/05/20 0408 02/06/20 0323  PROCALCITON  --  4.28  --   --   --   WBC 17.2*  --  21.0* 20.7* 21.5*    Liver Function Tests: Recent Labs  Lab 02/04/20 1622 02/05/20 0408 02/05/20 1558 02/06/20 0323  ALBUMIN 2.2* 2.1* 2.1* 2.1*   No results for input(s): LIPASE, AMYLASE in the last 168 hours. No results  for input(s): AMMONIA in the last 168 hours.  ABG    Component Value Date/Time   PHART 7.382 02/06/2020 0329   PCO2ART 41.1 02/06/2020 0329   PO2ART 71 (L) 02/06/2020 0329   HCO3 24.9 02/06/2020 0329   TCO2 26 02/06/2020 0329   ACIDBASEDEF 1.0 02/06/2020 0329   O2SAT 95.0 02/06/2020 0329     Coagulation Profile: No results for input(s): INR, PROTIME in the last 168 hours.  Cardiac Enzymes: No results for input(s): CKTOTAL, CKMB, CKMBINDEX, TROPONINI in the last 168 hours.  HbA1C: No results found for: HGBA1C  CBG: No results for input(s): GLUCAP in the last 168 hours.   This patient is critically ill with multiple organ system failure which requires frequent high complexity decision making, assessment, support, evaluation, and titration of therapies. This was completed through the application of advanced monitoring technologies and extensive interpretation of multiple databases. During this encounter critical care time was devoted to patient care services described in this note for 55 minutes.  Julian Hy, DO 02/06/20 9:02 AM [facility]

## 2020-02-06 NOTE — Plan of Care (Signed)
Discussed with pulm and nursing this afternoon.    - We have given normal saline bolus 500 ml (not removing with CRRT)  - UF goal has been zero this afternoon zero (we are not pulling any fluid - he is being allowed to run positive/we are resuscitating him with fluid).  To clarify, he is not keep even.  Nursing confirmed same.   Claudia Desanctis 02/06/2020 5:14 PM

## 2020-02-06 NOTE — Progress Notes (Signed)
Chaplain engaged in follow-up visit with Larry [name] wife and son.  Chaplain offered support and let them know about the ways in which they can tele-visit with Larry [name].  Larry [name] son expressed how hard it has been not to see his dad in person until today.  Chaplain offered ministry of presence.   Chaplain will continue to follow-up.

## 2020-02-06 NOTE — Progress Notes (Signed)
CRITICAL VALUE ALERT  Critical Value:  Lactic 5.6  Date & Time Notied:  02/06/20 1107  Provider Notified: Carlis Abbott

## 2020-02-06 NOTE — Procedures (Signed)
Intubation Procedure Note Larry [name] [id number] [birth year]  Procedure: Intubation Indications: Respiratory insufficiency  Procedure Details Consent: Risks of procedure as well as the alternatives and risks of each were explained to the (patient/caregiver).  Consent for procedure obtained. Time Out: Verified patient identification, verified procedure, site/side was marked, verified correct patient position, special equipment/implants available, medications/allergies/relevent history reviewed, required imaging and test results available.  Performed  Maximum sterile technique was used including gloves and hand hygiene.  MAC and 4 glidescope  Hypotension treated with epinephrine 0.70m x 3 doses, neosynephrine 0.111m and titration of vasopressin and norepinephrine.   Fentanyl 50 mcg, rocuronium 805mV glidescope S4, 1 attempt, grade 1 view    Evaluation Hemodynamic Status: Persistent hypotension treated with pressors; O2 sats: transiently fell during during procedure Patient's Current Condition: unstable due to ongoing septic shock Complications: No apparent complications Patient did tolerate procedure well. Chest X-ray ordered to verify placement.  CXR: pending.   Larry Hy30/2021

## 2020-02-07 ENCOUNTER — Inpatient Hospital Stay (HOSPITAL_COMMUNITY): Payer: PPO

## 2020-02-07 DIAGNOSIS — Z8719 Personal history of other diseases of the digestive system: Secondary | ICD-10-CM

## 2020-02-07 DIAGNOSIS — J156 Pneumonia due to other aerobic Gram-negative bacteria: Secondary | ICD-10-CM

## 2020-02-07 DIAGNOSIS — N184 Chronic kidney disease, stage 4 (severe): Secondary | ICD-10-CM

## 2020-02-07 DIAGNOSIS — D696 Thrombocytopenia, unspecified: Secondary | ICD-10-CM

## 2020-02-07 DIAGNOSIS — I5033 Acute on chronic diastolic (congestive) heart failure: Secondary | ICD-10-CM | POA: Diagnosis not present

## 2020-02-07 DIAGNOSIS — J9601 Acute respiratory failure with hypoxia: Secondary | ICD-10-CM | POA: Diagnosis not present

## 2020-02-07 DIAGNOSIS — G9341 Metabolic encephalopathy: Secondary | ICD-10-CM | POA: Diagnosis not present

## 2020-02-07 DIAGNOSIS — I4819 Other persistent atrial fibrillation: Secondary | ICD-10-CM

## 2020-02-07 DIAGNOSIS — E43 Unspecified severe protein-calorie malnutrition: Secondary | ICD-10-CM

## 2020-02-07 DIAGNOSIS — Z8774 Personal history of (corrected) congenital malformations of heart and circulatory system: Secondary | ICD-10-CM | POA: Diagnosis not present

## 2020-02-07 DIAGNOSIS — R601 Generalized edema: Secondary | ICD-10-CM

## 2020-02-07 LAB — CBC WITH DIFFERENTIAL/PLATELET
Abs Immature Granulocytes: 0.34 10*3/uL — ABNORMAL HIGH (ref 0.00–0.07)
Basophils Absolute: 0 10*3/uL (ref 0.0–0.1)
Basophils Relative: 0 %
Eosinophils Absolute: 0.3 10*3/uL (ref 0.0–0.5)
Eosinophils Relative: 1 %
HCT: 26.2 % — ABNORMAL LOW (ref 39.0–52.0)
Hemoglobin: 8.6 g/dL — ABNORMAL LOW (ref 13.0–17.0)
Immature Granulocytes: 2 %
Lymphocytes Relative: 2 %
Lymphs Abs: 0.4 10*3/uL — ABNORMAL LOW (ref 0.7–4.0)
MCH: 29 pg (ref 26.0–34.0)
MCHC: 32.8 g/dL (ref 30.0–36.0)
MCV: 88.2 fL (ref 80.0–100.0)
Monocytes Absolute: 0.9 10*3/uL (ref 0.1–1.0)
Monocytes Relative: 4 %
Neutro Abs: 21.1 10*3/uL — ABNORMAL HIGH (ref 1.7–7.7)
Neutrophils Relative %: 91 %
Platelets: 32 10*3/uL — ABNORMAL LOW (ref 150–400)
RBC: 2.97 MIL/uL — ABNORMAL LOW (ref 4.22–5.81)
RDW: 22.6 % — ABNORMAL HIGH (ref 11.5–15.5)
WBC: 23.1 10*3/uL — ABNORMAL HIGH (ref 4.0–10.5)
nRBC: 10.9 % — ABNORMAL HIGH (ref 0.0–0.2)

## 2020-02-07 LAB — POCT I-STAT 7, (LYTES, BLD GAS, ICA,H+H)
Acid-Base Excess: 0 mmol/L (ref 0.0–2.0)
Acid-Base Excess: 1 mmol/L (ref 0.0–2.0)
Bicarbonate: 24.7 mmol/L (ref 20.0–28.0)
Bicarbonate: 26.1 mmol/L (ref 20.0–28.0)
Calcium, Ion: 1 mmol/L — ABNORMAL LOW (ref 1.15–1.40)
Calcium, Ion: 1.04 mmol/L — ABNORMAL LOW (ref 1.15–1.40)
HCT: 29 % — ABNORMAL LOW (ref 39.0–52.0)
HCT: 28 % — ABNORMAL LOW (ref 39.0–52.0)
Hemoglobin: 9.9 g/dL — ABNORMAL LOW (ref 13.0–17.0)
Hemoglobin: 9.5 g/dL — ABNORMAL LOW (ref 13.0–17.0)
O2 Saturation: 99 %
O2 Saturation: 99 %
Patient temperature: 35.2
Potassium: 5.1 mmol/L (ref 3.5–5.1)
Potassium: 4.7 mmol/L (ref 3.5–5.1)
Sodium: 136 mmol/L (ref 135–145)
Sodium: 136 mmol/L (ref 135–145)
TCO2: 26 mmol/L (ref 22–32)
TCO2: 27 mmol/L (ref 22–32)
pCO2 arterial: 37 mmHg (ref 32.0–48.0)
pCO2 arterial: 42.8 mmHg (ref 32.0–48.0)
pH, Arterial: 7.425 (ref 7.350–7.450)
pH, Arterial: 7.393 (ref 7.350–7.450)
pO2, Arterial: 149 mmHg — ABNORMAL HIGH (ref 83.0–108.0)
pO2, Arterial: 125 mmHg — ABNORMAL HIGH (ref 83.0–108.0)

## 2020-02-07 LAB — MAGNESIUM: Magnesium: 2.4 mg/dL (ref 1.7–2.4)

## 2020-02-07 LAB — RENAL FUNCTION PANEL
Albumin: 1.6 g/dL — ABNORMAL LOW (ref 3.5–5.0)
Albumin: 1.7 g/dL — ABNORMAL LOW (ref 3.5–5.0)
Anion gap: 13 (ref 5–15)
Anion gap: 12 (ref 5–15)
BUN: 22 mg/dL (ref 8–23)
BUN: 21 mg/dL (ref 8–23)
CO2: 21 mmol/L — ABNORMAL LOW (ref 22–32)
CO2: 22 mmol/L (ref 22–32)
Calcium: 7.4 mg/dL — ABNORMAL LOW (ref 8.9–10.3)
Calcium: 7.6 mg/dL — ABNORMAL LOW (ref 8.9–10.3)
Chloride: 103 mmol/L (ref 98–111)
Chloride: 104 mmol/L (ref 98–111)
Creatinine, Ser: 1.17 mg/dL (ref 0.61–1.24)
Creatinine, Ser: 1.06 mg/dL (ref 0.61–1.24)
GFR calc Af Amer: >60 mL/min (ref >60)
GFR calc Af Amer: >60 mL/min (ref >60)
GFR calc non Af Amer: 60 mL/min — ABNORMAL LOW (ref >60)
GFR calc non Af Amer: >60 mL/min (ref >60)
Glucose, Bld: 158 mg/dL — ABNORMAL HIGH (ref 70–99)
Glucose, Bld: 80 mg/dL (ref 70–99)
Phosphorus: 2.7 mg/dL (ref 2.5–4.6)
Phosphorus: 2.4 mg/dL — ABNORMAL LOW (ref 2.5–4.6)
Potassium: 3.9 mmol/L (ref 3.5–5.1)
Potassium: 4.6 mmol/L (ref 3.5–5.1)
Sodium: 137 mmol/L (ref 135–145)
Sodium: 138 mmol/L (ref 135–145)

## 2020-02-07 LAB — GLUCOSE, CAPILLARY
Glucose-Capillary: 84 mg/dL (ref 70–99)
Glucose-Capillary: 69 mg/dL — ABNORMAL LOW (ref 70–99)
Glucose-Capillary: 122 mg/dL — ABNORMAL HIGH (ref 70–99)
Glucose-Capillary: 110 mg/dL — ABNORMAL HIGH (ref 70–99)
Glucose-Capillary: 112 mg/dL — ABNORMAL HIGH (ref 70–99)

## 2020-02-07 LAB — COOXEMETRY PANEL
Carboxyhemoglobin: 1.5 % (ref 0.5–1.5)
Methemoglobin: 0.6 % (ref 0.0–1.5)
O2 Saturation: 80.4 %
Total hemoglobin: 8.2 g/dL — ABNORMAL LOW (ref 12.0–16.0)

## 2020-02-07 MED ORDER — VANCOMYCIN HCL 1500 MG/300ML IV SOLN
1500.0000 mg | Freq: Once | INTRAVENOUS | Status: AC
  Administered 2020-02-07: 1500 mg via INTRAVENOUS
  Filled 2020-02-07: qty 300

## 2020-02-07 MED ORDER — POLYETHYLENE GLYCOL 3350 17 G PO PACK: 17.0000 g | PACK | Freq: Every day | ORAL | Status: DC

## 2020-02-07 MED ORDER — PRO-STAT SUGAR FREE PO LIQD
30.0000 mL | Freq: Two times a day (BID) | ORAL | Status: DC
  Filled 2020-02-07: qty 30

## 2020-02-07 MED ORDER — SODIUM PHOSPHATES 45 MMOLE/15ML IV SOLN
20.0000 mmol | Freq: Once | INTRAVENOUS | Status: AC
  Administered 2020-02-07: 20 mmol via INTRAVENOUS
  Filled 2020-02-07: qty 6.67

## 2020-02-07 MED ORDER — DEXTROSE 10 % IV SOLN: INTRAVENOUS | Status: DC

## 2020-02-07 MED ORDER — DOCUSATE SODIUM 50 MG/5ML PO LIQD
100.0000 mg | Freq: Two times a day (BID) | ORAL | Status: DC
  Administered 2020-02-07: 100 mg via ORAL
  Filled 2020-02-07: qty 10

## 2020-02-07 MED ORDER — DEXTROSE 50 % IV SOLN
INTRAVENOUS | Status: AC
  Filled 2020-02-07: qty 50

## 2020-02-07 MED ORDER — DOCUSATE SODIUM 50 MG/5ML PO LIQD
100.0000 mg | Freq: Two times a day (BID) | ORAL | Status: DC
  Administered 2020-02-07: 100 mg
  Filled 2020-02-07: qty 10

## 2020-02-07 MED ORDER — SODIUM CHLORIDE 0.9 % IV SOLN
2.0000 g | INTRAVENOUS | Status: DC
  Administered 2020-02-07: 09:00:00 2 g via INTRAVENOUS
  Filled 2020-02-07: qty 20
  Filled 2020-02-07: qty 2

## 2020-02-07 MED ORDER — VITAL HIGH PROTEIN PO LIQD: 1000.0000 mL | ORAL | Status: DC

## 2020-02-07 MED ORDER — FLUDROCORTISONE ACETATE 0.1 MG PO TABS
0.1000 mg | ORAL_TABLET | Freq: Every day | ORAL | Status: DC
  Filled 2020-02-07: qty 1

## 2020-02-07 MED ORDER — VANCOMYCIN HCL IN DEXTROSE 1-5 GM/200ML-% IV SOLN
1000.0000 mg | INTRAVENOUS | Status: DC
  Administered 2020-02-08: 1000 mg via INTRAVENOUS
  Filled 2020-02-07 (×3): qty 200

## 2020-02-07 MED ORDER — MIDODRINE HCL 5 MG PO TABS
15.0000 mg | ORAL_TABLET | Freq: Three times a day (TID) | ORAL | Status: DC
  Administered 2020-02-07 – 2020-02-08 (×5): 15 mg
  Filled 2020-02-07 (×5): qty 3

## 2020-02-07 MED ORDER — RENA-VITE PO TABS
1.0000 | ORAL_TABLET | Freq: Every day | ORAL | Status: DC
  Administered 2020-02-07: 1
  Filled 2020-02-07: qty 1

## 2020-02-07 MED ORDER — PRO-STAT SUGAR FREE PO LIQD
30.0000 mL | Freq: Three times a day (TID) | ORAL | Status: DC
  Administered 2020-02-07 – 2020-02-08 (×4): 30 mL
  Filled 2020-02-07 (×3): qty 30

## 2020-02-07 MED ORDER — VITAL AF 1.2 CAL PO LIQD
1000.0000 mL | ORAL | Status: DC
  Administered 2020-02-07: 1000 mL

## 2020-02-07 MED ORDER — DEXTROSE 50 % IV SOLN
25.0000 mL | INTRAVENOUS | Status: AC
  Administered 2020-02-07: 25 mL via INTRAVENOUS

## 2020-02-07 MED ORDER — PRAVASTATIN SODIUM 40 MG PO TABS
40.0000 mg | ORAL_TABLET | Freq: Every evening | ORAL | Status: DC
  Administered 2020-02-07 – 2020-02-08 (×2): 40 mg
  Filled 2020-02-07 (×2): qty 1

## 2020-02-07 MED ORDER — POLYETHYLENE GLYCOL 3350 17 G PO PACK: 17.0000 g | PACK | Freq: Two times a day (BID) | ORAL | Status: DC | PRN

## 2020-02-07 NOTE — Progress Notes (Signed)
Hypoglycemic Event  CBG: 69  Treatment: D50 25 mL (12.5 gm)  Symptoms: None  Follow-up CBG: Time: 1639 CBG Result: 122  Possible Reasons for Event: Unknown  Comments/MD notified: Dr. Carlis Abbott notified, new orders received for D10 infusion at 21ml/hr    [facility], Ardeth Sportsman

## 2020-02-07 NOTE — Plan of Care (Signed)
Afternoon rounds   Ordered to continue UF 0 ml/min this AM.  Patient has been kept even and then per an MD order UF was further increased .  Discussed with nursing.    Set as keep even for now but would defer pulling fluid.  Has significant inputs which are being concentrated as able.  Septic shock and thrombocytopenia.  Vanc added and linezolid was stopped.  No heparin with HD.    Bladder scan now and place foley if over 250 ml urine retained.  Bladder scan each shift otherwise.  Larry [name] 02/07/2020 1:31 PM

## 2020-02-07 NOTE — Progress Notes (Signed)
NAME:  Larry [name], MRN:  [id number], DOB:  Feb 17, 1941, LOS: 8 ADMISSION DATE:  01/24/2020, CONSULTATION DATE:  4/25 REFERRING MD:  Bensimhon, CHIEF COMPLAINT:  Pleural effusion   Brief History   Chronic right heart failure due to ASD S/p ASD closure, now with acute respiratory failure due to acute pulmonary edema. Requiring BiPAP.  History of present illness   Larry [name] is a 79 y/o gentleman admitted for closure of a 3cm ASD diagnosed during evaluation of right heart failure. He underwent ASD closure on 4/22.  He went into acute respiratory failure with worsening renal function on 4/24.  He had Swan-Ganz catheter, dialysis catheter placed to guide treatment.  He has had good response to diuresis.  He was able to come off BiPAP 4/23, but today required BiPAP again on 4/25. On BiPAP SOB was controlled and improved after thoracentesis on 4/25 yielding 1.7L of transudative fluid. PCCM signed off 4/26 and re-consulted 4/28 due to continued BiPAP requirements and concern for possible sepsis given CXR showing Edema vs Airspace disease with increased WBC and elevated Procalcitonin.  Past Medical History  CKD ASD CAD HLD HTN HFpEF w/ RHF  Significant Hospital Events   4/22 ASD closure 4/24 Right heart cath with Swan-Ganz placement, dialysis catheter placement 4/25 R Thoracentesis (-1/7L Transudate)  Consults:  PCCM Cardiology Nephro  Procedures:  4/22 ASD closure 4/24 Right heart cath with Swan-Ganz placement, dialysis catheter placement 4/25 R Thoracentesis (-1/7L Transudate)  Significant Diagnostic Tests:  CXR (4/25): Decreased RIGHT pleural effusion and improved RIGHT LOWER lung aeration. No evidence of pneumothorax.  CXR (4/26): Generalized pulmonary opacity with interval progression. Stable small right pleural effusion.  CXR (4/28): Stable bilateral lung opacities are noted consistent with pulmonary edema or pneumonia. Stable mild right pleural effusion is noted.  Micro  Data:  4/26 Pleural fluid: > WBC cells, no organisms, culture NG 4/28 Blood Cx: Pending 4/28 Urine Cx: NG 4/28 Sputum Cx: moderate GNRs> Citrobacter freundii (R cefazolin, otherwise pan-sensitive)   Antimicrobials:  Cefazolin 4/21 Linezolid 4/28 >> Cefepime 4/28 >>5/1 Ceftriaxone 5/1>>  Interim history/subjective:  Able to discontinue bicarb infusion overnight.  This morning responsive for family only.  Objective   Blood pressure (!) 106/48, pulse 83, temperature (!) 96.1 F (35.6 C), resp. rate (!) 24, height 6' (1.829 m), weight 89.1 kg, SpO2 (!) 89 %. PAP: (31-54)/(18-32) 38/28 CVP:  [10 mmHg-28 mmHg] 15 mmHg CO:  [4.2 L/min-5.7 L/min] 5.7 L/min CI:  [2 L/min/m2-2.7 L/min/m2] 2.7 L/min/m2  Vent Mode: PRVC FiO2 (%):  [45 %-100 %] 45 % Set Rate:  [20 bmp-25 bmp] 25 bmp Vt Set:  [580 mL-620 mL] 580 mL PEEP:  [8 cmH20] 8 cmH20 Plateau Pressure:  [26 cmH20-38 cmH20] 27 cmH20   Intake/Output Summary (Last 24 hours) at 02/07/2020 0805 Last data filed at 02/07/2020 0800 Gross per 24 hour  Intake 7734.62 ml  Output 54 ml  Net 7680.62 ml   Filed Weights   02/04/20 0500 02/05/20 0548 02/07/20 0500  Weight: 85.8 kg 84.7 kg 89.1 kg    Examination:  General: Critically ill-appearing elderly man lying in bed on no sedation, intubated.  Minimally responsive. HEENT: Larry [name]/AT, eyes anicteric.  ETT and OGT in place Cardio: Regular rate and rhythm, sinus rhythm on telemetry. Resp: Breathing comfortably mechanical ventilation.  Clear to auscultation anteriorly. Abdomen: Soft, mildly tender to palpation Extremities: Mottling of lower extremities, but warm.  Anasarca of lower extremities. Neuro: Not opening his eyes or following commands for me.  Minimally  opening his eyes and lifting his eyebrows for . Derm: Pallor, bruising on arms, mottling on legs.  Few petechiae on face, forehead  Plateau pressure 29 Driving pressure 21  CXR 5/1 personally reviewed -increased pulmonary edema on the  left, persistent infiltrate on the right.  Right dependent pleural effusion persists.  Resolved Hospital Problem list     Assessment & Plan:  Citrobacter pneumonia Acute hypoxic respiratory failure requiring mechanical ventilation Acute pulmonary edema Right-sided transudative pleural effusion -Continue low tidal volume ventilation, 4 to 8 cc/kg ideal body weight with goal plateau less than 30 and driving pressure less than 15-20.   -Daily SAT and SBT when appropriate -Fentanyl as needed for sedation as required.  Currently not requiring sedation due to encephalopathy -Continue CPT -Continue vancomycin.  Can de-escalate cefepime to ceftriaxone. -May require repeat thoracentesis versus chest tube placement to reevaluate right pleural effusion for possible evolution into parapneumonic effusion -Family is aware that this could be a prolonged intubation given his pre-existing medical conditions and frailty.  Shock-likely septic, less consistent with cardiogenic -Continue vasopressors as required to maintain MAP greater than 65-currently vasopressin, nor epi, epi, angiotensin II Continue stress dose steroids -Continue vancomycin and ceftriaxone  Afib, CAD, ASD s/p closure, A/C dHF with RHF.  Hypervolemic. -CRRT-goal euvolemia  Hypervolemic hyponatremia due to heart failure and kidney injury AGMA 2/2 Uremia AKI on CKD: Now on CRRT -CRRT per nephrology  Likely aspiration  -Eventually will need swallow evaluation post extubation  Acute blood loss anemia- possibly lower GI bleed while on anticoagulation -Agree with holding anticoagulation -Continue high-dose PPI -Continue supportive care and monitoring  Thrombocytopenia possibly due to acute illness, less likely due to medications-worsening. 4T HIT score= 5. -Transition linezolid to vancomycin -Continue to monitor -Not currently meeting indication for transfusion  Septic encephalopathy -Continue to hold sedation unless needed for  discomfort -Family present providing frequent reorientation -Continue supportive care  Daughter, son, and wife updated at bedside.   Best practice:  Per primary Disposition: ICU  Labs   CBC: Recent Labs  Lab 02/03/20 0525 02/04/20 0125 02/04/20 0257 02/04/20 0257 02/04/20 2305 02/04/20 2305 02/05/20 0408 02/05/20 1558 02/06/20 0323 02/06/20 0329 02/06/20 1347 02/06/20 1630 02/07/20 0058 02/07/20 0415 02/07/20 0753  WBC 15.0*  --  17.2*   < > 21.0*  --  20.7*  --  21.5*  --   --  23.8*  --  23.1*  --   NEUTROABS 13.1*  --  15.1*  --   --   --  18.6*  --  19.4*  --   --   --   --  21.1*  --   HGB 7.5*   < > 7.9*   < > 8.8*   < > 7.7*   < > 9.2*   < > 10.5* 8.5* 9.9* 8.6* 9.5*  HCT 23.1*   < > 23.1*   < > 26.3*   < > 23.4*   < > 27.7*   < > 31.0* 27.2* 29.0* 26.2* 28.0*  MCV 89.5  --  87.8   < > 88.3  --  88.6  --  87.4  --   --  91.0  --  88.2  --   PLT 77*  --  87*   < > 95*  --  87*  --  59*  --   --  52*  --  32*  --    < > = values in this interval not displayed.    Basic  Metabolic Panel: Recent Labs  Lab 02/02/20 0515 02/02/20 1407 02/03/20 0525 02/04/20 0125 02/04/20 1622 02/04/20 1622 02/05/20 0408 02/05/20 0408 02/05/20 1558 02/05/20 1558 02/06/20 0323 02/06/20 0329 02/06/20 1323 02/06/20 1347 02/07/20 0058 02/07/20 0415 02/07/20 0753  NA 129*   < > 128*   < > 128*   < > 134*   < > 135   < > 136   < > 137 135 136 137 136  K 3.1*   < > 3.7   < > 4.5   < > 4.6   < > 4.9   < > 4.4   < > 5.3* 5.6* 5.1 3.9 4.7  CL 85*   < > 85*   < > 90*   < > 99  --  100  --  101  --  103  --   --  103  --   CO2 29   < > 28   < > 23   < > 21*  --  19*  --  23  --  16*  --   --  21*  --   GLUCOSE 171*   < > 147*   < > 149*   < > 132*  --  106*  --  109*  --  68*  --   --  158*  --   BUN 87*   < > 96*   < > 92*   < > 60*  --  44*  --  31*  --  37*  --   --  22  --   CREATININE 3.07*   < > 3.32*   < > 3.00*   < > 2.18*  --  1.58*  --  1.32*  --  1.73*  --   --  1.17  --    CALCIUM 8.3*   < > 8.4*   < > 8.4*   < > 8.4*  --  7.6*  --  8.1*  --  7.8*  --   --  7.4*  --   MG 2.3  --  1.9  --   --   --  2.5*  --   --   --  2.6*  --   --   --   --  2.4  --   PHOS  --   --   --   --  4.6  --  3.9  --  3.6  --  3.0  --   --   --   --  2.7  --    < > = values in this interval not displayed.   GFR: Estimated Creatinine Clearance: 58 mL/min (by C-G formula based on SCr of 1.17 mg/dL). Recent Labs  Lab 02/04/20 0759 02/04/20 2305 02/05/20 0408 02/06/20 0323 02/06/20 0958 02/06/20 1323 02/06/20 1630 02/07/20 0415  PROCALCITON 4.28  --   --   --   --   --   --   --   WBC  --    < > 20.7* 21.5*  --   --  23.8* 23.1*  LATICACIDVEN  --   --   --   --  5.6* 6.4*  --   --    < > = values in this interval not displayed.    Liver Function Tests: Recent Labs  Lab 02/04/20 1622 02/05/20 0408 02/05/20 1558 02/06/20 0323 02/07/20 0415  ALBUMIN 2.2* 2.1* 2.1* 2.1* 1.6*   No results for input(s): LIPASE, AMYLASE  in the last 168 hours. No results for input(s): AMMONIA in the last 168 hours.  ABG    Component Value Date/Time   PHART 7.393 02/07/2020 0753   PCO2ART 42.8 02/07/2020 0753   PO2ART 125 (H) 02/07/2020 0753   HCO3 26.1 02/07/2020 0753   TCO2 27 02/07/2020 0753   ACIDBASEDEF 9.0 (H) 02/06/2020 1347   O2SAT 99.0 02/07/2020 0753     Coagulation Profile: No results for input(s): INR, PROTIME in the last 168 hours.  Cardiac Enzymes: No results for input(s): CKTOTAL, CKMB, CKMBINDEX, TROPONINI in the last 168 hours.  HbA1C: No results found for: HGBA1C  CBG: Recent Labs  Lab 02/06/20 1545  GLUCAP 106*     This patient is critically ill with multiple organ system failure which requires frequent high complexity decision making, assessment, support, evaluation, and titration of therapies. This was completed through the application of advanced monitoring technologies and extensive interpretation of multiple databases. During this encounter  critical care time was devoted to patient care services described in this note for 40 minutes.  Julian Hy, DO 02/07/20 8:18 AM [facility]

## 2020-02-07 NOTE — Progress Notes (Signed)
Pharmacy Antibiotic Note  Larry [name] is a 79 y.o. male admitted on 01/17/2020 for ASD closure. He went into acute respiratory failure with worsening renal function on 4/24. Now with low SVR and preserved cardiac output concerning for septic vs vasodilatory shock. Pharmacy has been consulted for cefepime and linezolid dosing. CRRT initiated 4/28.  Now with worsening platelet count on linezolid. Sputum culture growing citrobacter that is sensitive to everything except cefazolin.  Plan: Discussed with Dr. Carlis Abbott, will change linezolid to vancomycin due to potential for worsening thrombocytopenia.  Will narrow cefepime to ceftriaxone since citrobacter is sensitive. Vancomycin 1500 mg x 1 now, then 1g q 24 hrs while patient is on CRRT. Ceftriaxone 2g IV q 24 hrs. F/u cultures, CRRT duration, LOT, clinical status  Height: 6' (182.9 cm) Weight: 89.1 kg (196 lb 6.9 oz) IBW/kg (Calculated) : 77.6  Temp (24hrs), Avg:96.9 F (36.1 C), Min:94.8 F (34.9 C), Max:100.6 F (38.1 C)  Recent Labs  Lab 02/04/20 1622 02/04/20 2305 02/05/20 0408 02/05/20 1558 02/06/20 0323 02/06/20 0958 02/06/20 1323 02/06/20 1630 02/07/20 0415  WBC  --  21.0* 20.7*  --  21.5*  --   --  23.8* 23.1*  CREATININE   < >  --  2.18* 1.58* 1.32*  --  1.73*  --  1.17  LATICACIDVEN  --   --   --   --   --  5.6* 6.4*  --   --    < > = values in this interval not displayed.    Estimated Creatinine Clearance: 58 mL/min (by C-G formula based on SCr of 1.17 mg/dL).    Allergies  Allergen Reactions  . Plavix [Clopidogrel Bisulfate] Anaphylaxis         Antimicrobials this admission: 4/22 Cefazolin x 1 Linezolid 4/28 >>5/1 Cefepime 4/28 >>5/1 Vancomycin 5/1 >  Ceftriaxone 5/1 >   Dose adjustments this admission: None  Microbiology results: 4/24 MRSA: neg 4/25 Pleural fluid: ngF 4/28 sputum: citrobacter R ancef S cefepime/ceftriaxone 4/28 BCx: ngtd 4/28 urine: ngF  Thank you for allowing pharmacy to be a  part of this patient's care. Marguerite Olea, [facility] Clinical Pharmacist Phone [phone]  02/07/2020 8:11 AM   Please check [email or web address] for unit-specific pharmacist phone numbers

## 2020-02-07 NOTE — Progress Notes (Signed)
[facility] Progress Note  Name: Larry [name] MRN: [id number] DOB: [birth year]  Chief Complaint:  Presented for ASD repair  Subjective:  Seen on CRRT and procedure supervised.  Goal has been a patient removal rate of zero per hour (he is allowed to be positive - we are intentionally not keeping even) as had been having increased pressor requirement.  Was intubated yesterday.  He has been on levo at 82, epi, vaso, and angiotension.  Note bicarb gtt started per pulm.   Review of systems:    Unable to obtain 2/2 intubated   Intake/Output Summary (Last 24 hours) at 02/07/2020 0602 Last data filed at 02/07/2020 0500 Gross per 24 hour  Intake 7104.05 ml  Output 42 ml  Net 7062.05 ml    Vitals:  Vitals:   02/07/20 0445 02/07/20 0500 02/07/20 0515 02/07/20 0530  BP:      Pulse: 80 81 79 80  Resp: (!) 23 (!) 25 14 16   Temp: (!) 95.4 F (35.2 C) (!) 95.4 F (35.2 C) (!) 95.5 F (35.3 C) (!) 95.5 F (35.3 C)  TempSrc:      SpO2: 95% 97% 100% 99%  Weight:      Height:         Physical Exam:      General adult male in bed critically ill and intubated HEENT normocephalic atraumatic  Neck supple trachea midline Lungs rhonchi and course breath sounds on auscultation  Heart RRR on pressors Abdomen softly distended Extremities 2-3+ pitting edema  Neuro - on sedation Left IJ nontunneled catheter   Medications reviewed   Labs:  BMP Latest Ref Rng & Units 02/07/2020 02/06/2020 02/06/2020  Glucose 70 - 99 mg/dL - - 68(L)  BUN 8 - 23 mg/dL - - 37(H)  Creatinine 0.61 - 1.24 mg/dL - - 1.73(H)  BUN/Creat Ratio 10 - 24 - - -  Sodium 135 - 145 mmol/L 136 135 137  Potassium 3.5 - 5.1 mmol/L 5.1 5.6(H) 5.3(H)  Chloride 98 - 111 mmol/L - - 103  CO2 22 - 32 mmol/L - - 16(L)  Calcium 8.9 - 10.3 mg/dL - - 7.8(L)    Assessment/Plan:   # Acute kidney injury  - with prerenal and ischemic insults in the setting of shock with overload and hypotension.  Left IJ nontunneled  catheter was placed and started CRRT 4/28.   - 4K fluids  - UF goal at present is zero mL/per (not keep even).  Reassess per patient status and will discuss with pulm.  Reassess a transition to keep even vs net positive only 50-100 an hour.  Please page nephrology for any CRRT changes - discontinue bicarb gtt  # Fluid volume overload - currently not tolerating UF given increasing pressor requirement  - CHF following closely  # Acute hypoxic respiratory failure - on mechanical ventilation   # Septic shock - Pressors and antibiotics per primary team  # Status post ASD repair per cardiology  # Hyponatremia - Setting of overload.  Also has been on Lasix and diminishing response.  Follow with CRRT - resolved  # Iron deficiency anemia - Transfusions per primary team - note report of dark stools previously   - s/p aranesp 40 mcg once 4/29   # CKD stage III  - baseline Cr is 1.84 per 11/2019 but rose to 2 - 2.4 in March 2021 with possible progression to stage IV at that time  Claudia Desanctis, MD 02/07/2020 6:12 AM

## 2020-02-07 NOTE — Progress Notes (Addendum)
Patient ID: Larry [name], male   DOB: Aug 28, 1941, 79 y.o.   MRN: [id number]    Advanced Heart Failure Rounding Note  Patient Profile  Admitted for large secundum ASD closure.   Developed acute pulmonary edema with respiratory failure and worsening renal function.  Underwent placement of a Swan-Ganz catheter and temporary dialysis catheter.  Hospital course further c/b shock, felt to be vasodilatory/septic w/ high pressor support. Also w/ recurrent GIB w/ anemia requiring transfusion.    Subjective:     Intubated 4/30, worsening shock.  CVVH with no UF.    This morning, have been able to bring down pressor doses a bit.  Remains on NE 80, epinephrine 15, vasopressin 0.04, Giapreza 30.  He is also on midodrine 15 tid, Florinef, and hydrocortisone 100 q8.  HCO3 gtt stopped this morning.  ABG 7.42/149/37.   CXR with R>L effusions, pulmonary edema.  He is hypothermic.  Remains on linezolid/cefepime.  Sputum with Citrobacter freundii.   Hgb trending up after transfusion, 7.7>>9.2>>10.5>>8.6  Plt ct trending down, 95>>87>>59 >>32  Swan #s CVP 20 PA 35/25 Thermo CI 2.7 Co-ox 69% (last night)  Wife and daughter at bedside.   Echo (4/21): EF 60-65%, RV looked relatively normal, ASD closure device intact, dilated IVC  Objective:   Weight Range:  Vital Signs:   Temp:  [94.8 F (34.9 C)-100.6 F (38.1 C)] 95.7 F (35.4 C) (05/01 0615) Pulse Rate:  [67-97] 81 (05/01 0615) Resp:  [12-29] 14 (05/01 0615) BP: (67-146)/(18-105) 134/105 (05/01 0000) SpO2:  [83 %-100 %] 98 % (05/01 0615) Arterial Line BP: (72-142)/(35-63) 138/59 (05/01 0615) FiO2 (%):  [50 %-100 %] 50 % (05/01 0400) Weight:  [89.1 kg] 89.1 kg (05/01 0500) Last BM Date: 02/04/20  Weight change: Filed Weights   02/04/20 0500 02/05/20 0548 02/07/20 0500  Weight: 85.8 kg 84.7 kg 89.1 kg    Intake/Output:   Intake/Output Summary (Last 24 hours) at 02/07/2020 0656 Last data filed at 02/07/2020 0600 Gross per 24 hour    Intake 7423.8 ml  Output 46 ml  Net 7377.8 ml     Physical Exam: General: on vent Neck: JVP 16 cm, no thyromegaly or thyroid nodule.  Lungs: Decreased at bases. CV: Nondisplaced PMI.  Heart regular S1/S2, no S3/S4, no murmur.  1+ edema to thighs.   Abdomen: Soft, no hepatosplenomegaly, no distention.  Skin: Intact without lesions or rashes.  Neurologic: Sedated this morning. Extremities: No clubbing or cyanosis.  HEENT: Normal.   Telemetry: NSR mid 70s  Labs: Basic Metabolic Panel: Recent Labs  Lab 02/02/20 0515 02/02/20 1407 02/03/20 0525 02/04/20 0125 02/04/20 1622 02/04/20 1622 02/05/20 0408 02/05/20 0408 02/05/20 1558 02/05/20 1558 02/06/20 0323 02/06/20 0329 02/06/20 7829 02/06/20 1323 02/06/20 1347 02/07/20 0058 02/07/20 0415  NA 129*   < > 128*   < > 128*   < > 134*   < > 135   < > 136   < > 136 137 135 136 137  K 3.1*   < > 3.7   < > 4.5   < > 4.6   < > 4.9   < > 4.4   < > 4.9 5.3* 5.6* 5.1 3.9  CL 85*   < > 85*   < > 90*   < > 99  --  100  --  101  --   --  103  --   --  103  CO2 29   < > 28   < > 23   < >  21*  --  19*  --  23  --   --  16*  --   --  21*  GLUCOSE 171*   < > 147*   < > 149*   < > 132*  --  106*  --  109*  --   --  68*  --   --  158*  BUN 87*   < > 96*   < > 92*   < > 60*  --  44*  --  31*  --   --  37*  --   --  22  CREATININE 3.07*   < > 3.32*   < > 3.00*   < > 2.18*  --  1.58*  --  1.32*  --   --  1.73*  --   --  1.17  CALCIUM 8.3*   < > 8.4*   < > 8.4*   < > 8.4*   < > 7.6*   < > 8.1*  --   --  7.8*  --   --  7.4*  MG 2.3  --  1.9  --   --   --  2.5*  --   --   --  2.6*  --   --   --   --   --  2.4  PHOS  --   --   --   --  4.6  --  3.9  --  3.6  --  3.0  --   --   --   --   --  2.7   < > = values in this interval not displayed.    Liver Function Tests: Recent Labs  Lab 02/04/20 1622 02/05/20 0408 02/05/20 1558 02/06/20 0323 02/07/20 0415  ALBUMIN 2.2* 2.1* 2.1* 2.1* 1.6*   No results for input(s): LIPASE, AMYLASE in the last  168 hours. No results for input(s): AMMONIA in the last 168 hours.  CBC: Recent Labs  Lab 02/03/20 0525 02/04/20 0125 02/04/20 0257 02/04/20 0257 02/04/20 2305 02/04/20 2305 02/05/20 0408 02/05/20 1558 02/06/20 0323 02/06/20 0329 02/06/20 0953 02/06/20 1347 02/06/20 1630 02/07/20 0058 02/07/20 0415  WBC 15.0*  --  17.2*   < > 21.0*  --  20.7*  --  21.5*  --   --   --  23.8*  --  23.1*  NEUTROABS 13.1*  --  15.1*  --   --   --  18.6*  --  19.4*  --   --   --   --   --  21.1*  HGB 7.5*   < > 7.9*   < > 8.8*   < > 7.7*   < > 9.2*   < > 10.5* 10.5* 8.5* 9.9* 8.6*  HCT 23.1*   < > 23.1*   < > 26.3*   < > 23.4*   < > 27.7*   < > 31.0* 31.0* 27.2* 29.0* 26.2*  MCV 89.5  --  87.8   < > 88.3  --  88.6  --  87.4  --   --   --  91.0  --  88.2  PLT 77*  --  87*   < > 95*  --  87*  --  59*  --   --   --  52*  --  32*   < > = values in this interval not displayed.    Cardiac Enzymes: No results for input(s): CKTOTAL, CKMB, CKMBINDEX, TROPONINI in the last 168  hours.  BNP: BNP (last 3 results) Recent Labs    12/19/19 1404 01/15/20 1422 01/26/2020 1106  BNP 192.2* 607.6* 573.5*    ProBNP (last 3 results) No results for input(s): PROBNP in the last 8760 hours.    Other results:  Imaging: DG CHEST PORT 1 VIEW  Result Date: 02/06/2020 CLINICAL DATA:  Hypoxia EXAM: PORTABLE CHEST 1 VIEW COMPARISON:  February 04, 2020 FINDINGS: Endotracheal tube tip is 5.4 cm above the carina. Nasogastric tube the tip and side port are below the diaphragm. Swan-Ganz catheter tip is in the right main pulmonary artery. Left subclavian catheter tip is in the superior vena cava. There is a mediastinal drain. There is an apparent chest tube on each side inferiorly. No pneumothorax. There are bilateral pleural effusions, considerably larger on the right than on the left. There is extensive interstitial pulmonary edema with patchy airspace opacity throughout both mid and lower lung regions as well as in the right  upper lobe. Heart is upper normal in size with pulmonary vascularity within normal limits for portable technique. No adenopathy. There is aortic atherosclerosis. No bone lesions. IMPRESSION: Tube and catheter positions as described without pneumothorax. Pleural effusions bilaterally, larger on the right than left. There is interstitial and probable superimposed alveolar edema. There may be slightly less edema overall compared to 2 days prior. The overall appearance is felt to be indicative of congestive heart failure. Note that superimposed pneumonia cannot be excluded in this circumstance. Aortic Atherosclerosis (ICD10-I70.0). Electronically Signed   By: Lowella Grip III M.D.   On: 02/06/2020 09:11     Medications:     Scheduled Medications: . sodium chloride   Intravenous Once  . Benzocaine   Mouth/Throat Once  . chlorhexidine gluconate (MEDLINE KIT)  15 mL Mouth Rinse BID  . Chlorhexidine Gluconate Cloth  6 each Topical Daily  . docusate sodium  100 mg Oral BID  . fentaNYL (SUBLIMAZE) injection  25 mcg Intravenous Once  . fludrocortisone  0.1 mg Oral Daily  . hydrocortisone sod succinate (SOLU-CORTEF) inj  100 mg Intravenous Q8H  . mouth rinse  15 mL Mouth Rinse BID  . mouth rinse  15 mL Mouth Rinse 10 times per day  . midodrine  15 mg Oral TID WC  . multivitamin  1 tablet Oral QHS  . pantoprazole (PROTONIX) IV  40 mg Intravenous Q12H  . polyethylene glycol  17 g Oral Daily  . pravastatin  40 mg Oral QPM  . sodium chloride flush  10-40 mL Intracatheter Q12H  . sodium chloride flush  3 mL Intravenous Q12H    Infusions: .  prismasol BGK 4/2.5 400 mL/hr at 02/06/20 1355  .  prismasol BGK 4/2.5 300 mL/hr at 02/07/20 0620  . sodium chloride    . amiodarone 30 mg/hr (02/07/20 0600)  . angiotensin II (GIAPREZA) infusion 30 ng/kg/min (02/07/20 0600)  . ceFEPime (MAXIPIME) IV Stopped (02/06/20 2211)  . epinephrine 15 mcg/min (02/07/20 0600)  . fentaNYL infusion INTRAVENOUS    .  linezolid (ZYVOX) IV Stopped (02/06/20 2318)  . norepinephrine (LEVOPHED) Adult infusion 82 mcg/min (02/07/20 0600)  . prismasol BGK 4/2.5 1,800 mL/hr at 02/06/20 1951  . vasopressin (PITRESSIN) infusion - *FOR SHOCK* 0.04 Units/min (02/07/20 0600)    PRN Medications: Place/Maintain arterial line **AND** sodium chloride, acetaminophen, bisacodyl, fentaNYL, fentaNYL (SUBLIMAZE) injection, fentaNYL (SUBLIMAZE) injection, heparin, nitroGLYCERIN, ondansetron (ZOFRAN) IV, oxyCODONE, polyethylene glycol, Resource ThickenUp Clear, sodium chloride flush   Assessment.Plan:   1. Acute hypoxic respiratory failure with acute  pulmonary edema and R pleural effusion: Initially though due to pulmonary edema s/p ASD closure. S/p right thoracentesis 4/25, transudative (CHF).  CXR 4/28 with diffuse airspace disease. WBCs up to 23=> Suspect HCAP now (sputum with Citrobacter freundii).  Poor diuresis with IV Lasix.  - No UF currently via CVVH, but with some improvement in MAP would try to keep even to 50 cc/hr net negative.  - Covering empirically for PNA (cefepime/linezolid).    2. Shock: SVR has been low, cardiac output is preserved (CI 2.7 off Swan today).  Currently on vasopressin 0.04, NE 80, epinephrine 15, Giapreza 30  This appears to be primarily vasodilatory shock, ?septic shock.  He is hypothermic.  WBCs up to 23.  Required HCO3 gtt overnight, now off.   - As above, will cover empirically with abx  - Continue pressors, wean as tolerated.  - hydrocortisone 100 q8 + florinef - midodrine 15 tid.  3. Large secundum ASD s/p closure: s/p successful transcatheter closure of a large secundum ASD using a 26 mm Amplatzer septal occluder deviceon 01/20/2020.  Post op limited echo showed EF 60-65% with well seated ASD closure device with no residual flow. 4. Acute on chronic diastolic CHF with prominent RV failure: Still with predominant RV failure by Luiz Blare in setting of long-standing ASD, but also pulmonary edema after  ASD closure.  Now intubated and CXR with diffuse airspace disease/pulmonary edema yesterday am. CVP 20 this morning.  - Started CVVHD on 4/28. No UF currently with hypotension, but hopefully can run even to net -50 cc/hr today with improved MAP.  4. Acute on CKD stage IV: Creatinine baseline 2.1- 2.5.  Nephrology consulted. Started on CVVHD on 4/28.  5. Paroxysmal aifb/flutter: developed recurrent AF on 4/24.  Back in NSR now.  - Off Eliquis with GI bleeding and low plts.  - Continue amiodarone 30 mg/hr.    6. CAD: s/p remotePCI to RCA, had stent thrombosis back in 2000.No chest pain.  - Pravastatin.  - No ASA with Eliquis use.  7. History of GI bleed with chronic anemia: 4/28 FOBT +.  Transfused 1 unit PRBCs 4/29. Hgb 8.6 today  - Transfuse hgb < 8.  8. ID: Hypothermic, WBCs 23.  SVR has been low suggesting vasodilatory shock, suspect septic. CXR cannot rule out PNA along with pulmonary edema. Citrobacter in sputum.   - Cefepime/linezolid for broad coverage.  9. Hyponatremia: Hypervolemic hyponatremia.  10. Thrombocytopenia: Suspect related to acute medical illness, plts 32k.  No heparin. - Stopped eliquis as above.   - With GI bleeding, would transfuse plts < 20K.  - Consider linezolid as cause, would consider switch to vancomycin => d/w CCM.  11. Hematuria: Had difficult foley placement.  Having some hematuria, will follow for now with Eliquis continued.  This seems to be improving. 12. FEN: Will need tube feeds when pressor requirement lower.   Family updated.    CRITICAL CARE Performed by: [name]  Total critical care time: 45 minutes  Critical care time was exclusive of separately billable procedures and treating other patients.  Critical care was necessary to treat or prevent imminent or life-threatening deterioration.  Critical care was time spent personally by me on the following activities: development of treatment plan with patient and/or surrogate as well as  nursing, discussions with consultants, evaluation of patient's response to treatment, examination of patient, obtaining history from patient or surrogate, ordering and performing treatments and interventions, ordering and review of laboratory studies, ordering and review of radiographic  studies, pulse oximetry and re-evaluation of patient's condition.   [name] 02/07/2020 6:56 AM

## 2020-02-07 NOTE — Progress Notes (Signed)
Patient converted back into atrial fibrillation, rate controlled in the 90s-110s and blood pressure maintaining with a MAP of mid 60s. 12 lead obtained that confirmed atrial fibrillation. MD Arps notified and gave no new orders. Will continue to monitor closely.

## 2020-02-07 DEATH — deceased

## 2020-02-08 ENCOUNTER — Inpatient Hospital Stay (HOSPITAL_COMMUNITY): Payer: PPO

## 2020-02-08 DIAGNOSIS — R6521 Severe sepsis with septic shock: Secondary | ICD-10-CM | POA: Diagnosis not present

## 2020-02-08 DIAGNOSIS — A498 Other bacterial infections of unspecified site: Secondary | ICD-10-CM

## 2020-02-08 DIAGNOSIS — D696 Thrombocytopenia, unspecified: Secondary | ICD-10-CM | POA: Diagnosis not present

## 2020-02-08 DIAGNOSIS — J9601 Acute respiratory failure with hypoxia: Secondary | ICD-10-CM | POA: Diagnosis not present

## 2020-02-08 DIAGNOSIS — Z7189 Other specified counseling: Secondary | ICD-10-CM

## 2020-02-08 DIAGNOSIS — E872 Acidosis: Secondary | ICD-10-CM

## 2020-02-08 DIAGNOSIS — Z8774 Personal history of (corrected) congenital malformations of heart and circulatory system: Secondary | ICD-10-CM | POA: Diagnosis not present

## 2020-02-08 DIAGNOSIS — J90 Pleural effusion, not elsewhere classified: Secondary | ICD-10-CM | POA: Diagnosis not present

## 2020-02-08 DIAGNOSIS — D62 Acute posthemorrhagic anemia: Secondary | ICD-10-CM

## 2020-02-08 DIAGNOSIS — I5033 Acute on chronic diastolic (congestive) heart failure: Secondary | ICD-10-CM | POA: Diagnosis not present

## 2020-02-08 DIAGNOSIS — A419 Sepsis, unspecified organism: Secondary | ICD-10-CM | POA: Diagnosis not present

## 2020-02-08 DIAGNOSIS — Z9911 Dependence on respirator [ventilator] status: Secondary | ICD-10-CM | POA: Diagnosis not present

## 2020-02-08 LAB — POCT I-STAT 7, (LYTES, BLD GAS, ICA,H+H)
Acid-base deficit: 4 mmol/L — ABNORMAL HIGH (ref 0.0–2.0)
Acid-base deficit: 5 mmol/L — ABNORMAL HIGH (ref 0.0–2.0)
Bicarbonate: 20.6 mmol/L (ref 20.0–28.0)
Bicarbonate: 19.6 mmol/L — ABNORMAL LOW (ref 20.0–28.0)
Calcium, Ion: 0.97 mmol/L — ABNORMAL LOW (ref 1.15–1.40)
Calcium, Ion: 0.92 mmol/L — ABNORMAL LOW (ref 1.15–1.40)
HCT: 28 % — ABNORMAL LOW (ref 39.0–52.0)
HCT: 22 % — ABNORMAL LOW (ref 39.0–52.0)
Hemoglobin: 9.5 g/dL — ABNORMAL LOW (ref 13.0–17.0)
Hemoglobin: 7.5 g/dL — ABNORMAL LOW (ref 13.0–17.0)
O2 Saturation: 100 %
O2 Saturation: 100 %
Patient temperature: 36.4
Patient temperature: 36.5
Potassium: 7.2 mmol/L (ref 3.5–5.1)
Potassium: 4.7 mmol/L (ref 3.5–5.1)
Sodium: 134 mmol/L — ABNORMAL LOW (ref 135–145)
Sodium: 140 mmol/L (ref 135–145)
TCO2: 22 mmol/L (ref 22–32)
TCO2: 21 mmol/L — ABNORMAL LOW (ref 22–32)
pCO2 arterial: 34.2 mmHg (ref 32.0–48.0)
pCO2 arterial: 33.4 mmHg (ref 32.0–48.0)
pH, Arterial: 7.385 (ref 7.350–7.450)
pH, Arterial: 7.376 (ref 7.350–7.450)
pO2, Arterial: 238 mmHg — ABNORMAL HIGH (ref 83.0–108.0)
pO2, Arterial: 372 mmHg — ABNORMAL HIGH (ref 83.0–108.0)

## 2020-02-08 LAB — RENAL FUNCTION PANEL
Albumin: 1.6 g/dL — ABNORMAL LOW (ref 3.5–5.0)
Albumin: 1.5 g/dL — ABNORMAL LOW (ref 3.5–5.0)
Anion gap: 16 — ABNORMAL HIGH (ref 5–15)
Anion gap: 14 (ref 5–15)
BUN: 18 mg/dL (ref 8–23)
BUN: 21 mg/dL (ref 8–23)
CO2: 17 mmol/L — ABNORMAL LOW (ref 22–32)
CO2: 20 mmol/L — ABNORMAL LOW (ref 22–32)
Calcium: 7.6 mg/dL — ABNORMAL LOW (ref 8.9–10.3)
Calcium: 7.3 mg/dL — ABNORMAL LOW (ref 8.9–10.3)
Chloride: 103 mmol/L (ref 98–111)
Chloride: 104 mmol/L (ref 98–111)
Creatinine, Ser: 1.03 mg/dL (ref 0.61–1.24)
Creatinine, Ser: 1.01 mg/dL (ref 0.61–1.24)
GFR calc Af Amer: >60 mL/min (ref >60)
GFR calc Af Amer: >60 mL/min (ref >60)
GFR calc non Af Amer: >60 mL/min (ref >60)
GFR calc non Af Amer: >60 mL/min (ref >60)
Glucose, Bld: 100 mg/dL — ABNORMAL HIGH (ref 70–99)
Glucose, Bld: 169 mg/dL — ABNORMAL HIGH (ref 70–99)
Phosphorus: 3.6 mg/dL (ref 2.5–4.6)
Phosphorus: 3.7 mg/dL (ref 2.5–4.6)
Potassium: 4.5 mmol/L (ref 3.5–5.1)
Potassium: 5.1 mmol/L (ref 3.5–5.1)
Sodium: 136 mmol/L (ref 135–145)
Sodium: 138 mmol/L (ref 135–145)

## 2020-02-08 LAB — BODY FLUID CELL COUNT WITH DIFFERENTIAL
Lymphs, Fluid: 54 %
Monocyte-Macrophage-Serous Fluid: 13 % — ABNORMAL LOW (ref 50–90)
Neutrophil Count, Fluid: 33 % — ABNORMAL HIGH (ref 0–25)
Total Nucleated Cell Count, Fluid: 217 cu mm (ref 0–1000)

## 2020-02-08 LAB — CBC
HCT: 26.9 % — ABNORMAL LOW (ref 39.0–52.0)
HCT: 23.3 % — ABNORMAL LOW (ref 39.0–52.0)
HCT: 26.6 % — ABNORMAL LOW (ref 39.0–52.0)
Hemoglobin: 8.7 g/dL — ABNORMAL LOW (ref 13.0–17.0)
Hemoglobin: 7.5 g/dL — ABNORMAL LOW (ref 13.0–17.0)
Hemoglobin: 8.7 g/dL — ABNORMAL LOW (ref 13.0–17.0)
MCH: 29.2 pg (ref 26.0–34.0)
MCH: 29.2 pg (ref 26.0–34.0)
MCH: 29.3 pg (ref 26.0–34.0)
MCHC: 32.3 g/dL (ref 30.0–36.0)
MCHC: 32.2 g/dL (ref 30.0–36.0)
MCHC: 32.7 g/dL (ref 30.0–36.0)
MCV: 90.3 fL (ref 80.0–100.0)
MCV: 90.7 fL (ref 80.0–100.0)
MCV: 89.6 fL (ref 80.0–100.0)
Platelets: 24 10*3/uL — CL (ref 150–400)
Platelets: 101 10*3/uL — ABNORMAL LOW (ref 150–400)
Platelets: 76 10*3/uL — ABNORMAL LOW (ref 150–400)
RBC: 2.98 MIL/uL — ABNORMAL LOW (ref 4.22–5.81)
RBC: 2.57 MIL/uL — ABNORMAL LOW (ref 4.22–5.81)
RBC: 2.97 MIL/uL — ABNORMAL LOW (ref 4.22–5.81)
RDW: 23.5 % — ABNORMAL HIGH (ref 11.5–15.5)
RDW: 23.9 % — ABNORMAL HIGH (ref 11.5–15.5)
RDW: 22.3 % — ABNORMAL HIGH (ref 11.5–15.5)
WBC: 30.2 10*3/uL — ABNORMAL HIGH (ref 4.0–10.5)
WBC: 28.5 10*3/uL — ABNORMAL HIGH (ref 4.0–10.5)
WBC: 27.9 10*3/uL — ABNORMAL HIGH (ref 4.0–10.5)
nRBC: 20.7 % — ABNORMAL HIGH (ref 0.0–0.2)
nRBC: 24.6 % — ABNORMAL HIGH (ref 0.0–0.2)
nRBC: 21.8 % — ABNORMAL HIGH (ref 0.0–0.2)

## 2020-02-08 LAB — GLUCOSE, PLEURAL OR PERITONEAL FLUID: Glucose, Fluid: 58 mg/dL

## 2020-02-08 LAB — GRAM STAIN: Gram Stain: NONE SEEN

## 2020-02-08 LAB — PROTEIN, PLEURAL OR PERITONEAL FLUID: Total protein, fluid: <3 g/dL

## 2020-02-08 LAB — PROTIME-INR
INR: 3 — ABNORMAL HIGH (ref 0.8–1.2)
INR: 3.1 — ABNORMAL HIGH (ref 0.8–1.2)
Prothrombin Time: 30.3 seconds — ABNORMAL HIGH (ref 11.4–15.2)
Prothrombin Time: 30.8 seconds — ABNORMAL HIGH (ref 11.4–15.2)

## 2020-02-08 LAB — BASIC METABOLIC PANEL
Anion gap: 18 — ABNORMAL HIGH (ref 5–15)
BUN: 18 mg/dL (ref 8–23)
CO2: 17 mmol/L — ABNORMAL LOW (ref 22–32)
Calcium: 8.8 mg/dL — ABNORMAL LOW (ref 8.9–10.3)
Chloride: 105 mmol/L (ref 98–111)
Creatinine, Ser: 0.94 mg/dL (ref 0.61–1.24)
GFR calc Af Amer: >60 mL/min (ref >60)
GFR calc non Af Amer: >60 mL/min (ref >60)
Glucose, Bld: 72 mg/dL (ref 70–99)
Potassium: 6 mmol/L — ABNORMAL HIGH (ref 3.5–5.1)
Sodium: 140 mmol/L (ref 135–145)

## 2020-02-08 LAB — COOXEMETRY PANEL
Carboxyhemoglobin: 1.5 % (ref 0.5–1.5)
Methemoglobin: 0.5 % (ref 0.0–1.5)
O2 Saturation: 74.8 %
Total hemoglobin: 8.4 g/dL — ABNORMAL LOW (ref 12.0–16.0)

## 2020-02-08 LAB — MAGNESIUM: Magnesium: 2.3 mg/dL (ref 1.7–2.4)

## 2020-02-08 LAB — FIBRINOGEN
Fibrinogen: 426 mg/dL (ref 210–475)
Fibrinogen: 481 mg/dL — ABNORMAL HIGH (ref 210–475)

## 2020-02-08 LAB — GLUCOSE, CAPILLARY
Glucose-Capillary: 98 mg/dL (ref 70–99)
Glucose-Capillary: 75 mg/dL (ref 70–99)
Glucose-Capillary: 105 mg/dL — ABNORMAL HIGH (ref 70–99)
Glucose-Capillary: 151 mg/dL — ABNORMAL HIGH (ref 70–99)

## 2020-02-08 LAB — D-DIMER, QUANTITATIVE: D-Dimer, Quant: 11.07 ug/mL-FEU — ABNORMAL HIGH (ref 0.00–0.50)

## 2020-02-08 LAB — LACTIC ACID, PLASMA: Lactic Acid, Venous: 7.9 mmol/L (ref 0.5–1.9)

## 2020-02-08 LAB — PREPARE RBC (CROSSMATCH)

## 2020-02-08 LAB — LACTATE DEHYDROGENASE, PLEURAL OR PERITONEAL FLUID: LD, Fluid: 388 U/L — ABNORMAL HIGH (ref 3–23)

## 2020-02-08 MED ORDER — SODIUM BICARBONATE-DEXTROSE 150-5 MEQ/L-% IV SOLN
150.0000 meq | INTRAVENOUS | Status: DC
  Administered 2020-02-08: 150 meq via INTRAVENOUS
  Filled 2020-02-08 (×2): qty 1000

## 2020-02-08 MED ORDER — CALCIUM CHLORIDE 10 % IV SOLN
1.0000 g | Freq: Once | INTRAVENOUS | Status: AC
  Administered 2020-02-08: 1 g via INTRAVENOUS

## 2020-02-08 MED ORDER — CALCIUM GLUCONATE-NACL 1-0.675 GM/50ML-% IV SOLN: 1.0000 g | Freq: Once | INTRAVENOUS | Status: DC

## 2020-02-08 MED ORDER — SODIUM CHLORIDE 0.9 % IV SOLN
2.0000 g | Freq: Two times a day (BID) | INTRAVENOUS | Status: DC
  Administered 2020-02-08: 2 g via INTRAVENOUS
  Filled 2020-02-08 (×2): qty 2

## 2020-02-08 MED ORDER — POLYVINYL ALCOHOL 1.4 % OP SOLN
1.0000 [drp] | Freq: Four times a day (QID) | OPHTHALMIC | Status: DC | PRN
  Filled 2020-02-08: qty 15

## 2020-02-08 MED ORDER — PHENYLEPHRINE HCL-NACL 20-0.9 MG/250ML-% IV SOLN
0.0000 ug/min | INTRAVENOUS | Status: DC
  Administered 2020-02-08: 50 ug/min via INTRAVENOUS
  Filled 2020-02-08: qty 250

## 2020-02-08 MED ORDER — EPINEPHRINE PF 1 MG/ML IJ SOLN
0.5000 ug/min | INTRAVENOUS | Status: DC
  Administered 2020-02-08 (×3): 30 ug/min via INTRAVENOUS
  Filled 2020-02-08 (×5): qty 8

## 2020-02-08 MED ORDER — SODIUM CHLORIDE 0.9 % IV SOLN
0.5000 mg/h | INTRAVENOUS | Status: DC
  Administered 2020-02-08: 0.5 mg/h via INTRAVENOUS
  Filled 2020-02-08: qty 2.5

## 2020-02-08 MED ORDER — SODIUM CHLORIDE 0.9% IV SOLUTION: Freq: Once | INTRAVENOUS | Status: AC

## 2020-02-08 MED ORDER — GLYCOPYRROLATE 0.2 MG/ML IJ SOLN: 0.2000 mg | INTRAMUSCULAR | Status: DC | PRN

## 2020-02-08 MED ORDER — SODIUM BICARBONATE 8.4 % IV SOLN
50.0000 meq | Freq: Once | INTRAVENOUS | Status: AC
  Administered 2020-02-08: 50 meq via INTRAVENOUS

## 2020-02-08 MED ORDER — SODIUM CHLORIDE 0.9 % IV SOLN
1.2500 ng/kg/min | INTRAVENOUS | Status: DC
  Filled 2020-02-08: qty 1

## 2020-02-08 MED ORDER — SODIUM BICARBONATE 8.4 % IV SOLN
INTRAVENOUS | Status: AC
  Filled 2020-02-08: qty 50

## 2020-02-08 MED ORDER — PHENYLEPHRINE HCL-NACL 10-0.9 MG/250ML-% IV SOLN
INTRAVENOUS | Status: AC
  Filled 2020-02-08: qty 250

## 2020-02-08 MED ORDER — PHENYLEPHRINE CONCENTRATED 100MG/250ML (0.4 MG/ML) INFUSION SIMPLE
0.0000 ug/min | INTRAVENOUS | Status: DC
  Administered 2020-02-08 (×2): 400 ug/min via INTRAVENOUS
  Filled 2020-02-08 (×3): qty 250

## 2020-02-08 MED ORDER — SODIUM BICARBONATE 8.4 % IV SOLN
100.0000 meq | Freq: Once | INTRAVENOUS | Status: AC
  Administered 2020-02-08: 100 meq via INTRAVENOUS
  Filled 2020-02-08: qty 100

## 2020-02-08 MED ORDER — LIDOCAINE HCL (PF) 1 % IJ SOLN
INTRAMUSCULAR | Status: AC
  Filled 2020-02-08: qty 30

## 2020-02-08 MED ORDER — LORAZEPAM 2 MG/ML IJ SOLN: 2.0000 mg | INTRAMUSCULAR | Status: DC | PRN

## 2020-02-08 MED ORDER — DIPHENHYDRAMINE HCL 50 MG/ML IJ SOLN: 25.0000 mg | INTRAMUSCULAR | Status: DC | PRN

## 2020-02-08 MED ORDER — CALCIUM GLUCONATE-NACL 1-0.675 GM/50ML-% IV SOLN
1.0000 g | Freq: Once | INTRAVENOUS | Status: AC
  Administered 2020-02-08: 1000 mg via INTRAVENOUS
  Filled 2020-02-08: qty 50

## 2020-02-08 MED ORDER — PHENYLEPHRINE HCL-NACL 10-0.9 MG/250ML-% IV SOLN: 0.0000 ug/min | INTRAVENOUS | Status: DC

## 2020-02-08 MED ORDER — HYDROMORPHONE BOLUS VIA INFUSION
0.2000 mg | INTRAVENOUS | Status: DC | PRN
  Administered 2020-02-08: 0.2 mg via INTRAVENOUS
  Filled 2020-02-08: qty 1

## 2020-02-08 MED ORDER — LACTATED RINGERS IV BOLUS
250.0000 mL | Freq: Once | INTRAVENOUS | Status: AC
  Administered 2020-02-08: 250 mL via INTRAVENOUS

## 2020-02-08 MED ORDER — GLYCOPYRROLATE 1 MG PO TABS
1.0000 mg | ORAL_TABLET | ORAL | Status: DC | PRN
  Filled 2020-02-08: qty 1

## 2020-02-08 MED FILL — Medication: Qty: 1 | Status: AC

## 2020-02-09 LAB — TYPE AND SCREEN
ABO/RH(D): A POS
Antibody Screen: NEGATIVE
Unit division: 0
Unit division: 0

## 2020-02-09 LAB — BPAM PLATELET PHERESIS
Blood Product Expiration Date: 202105022359
Blood Product Expiration Date: 202105032359
ISSUE DATE / TIME: 202105020751
ISSUE DATE / TIME: 202105020751
Unit Type and Rh: 5100
Unit Type and Rh: 6200

## 2020-02-09 LAB — BPAM RBC
Blood Product Expiration Date: 202105222359
Blood Product Expiration Date: 202105272359
ISSUE DATE / TIME: 202104291029
ISSUE DATE / TIME: 202105021004
Unit Type and Rh: 6200
Unit Type and Rh: 6200

## 2020-02-09 LAB — CULTURE, BLOOD (ROUTINE X 2)
Culture: NO GROWTH
Culture: NO GROWTH
Special Requests: ADEQUATE
Special Requests: ADEQUATE

## 2020-02-09 LAB — PREPARE PLATELET PHERESIS
Unit division: 0
Unit division: 0

## 2020-02-09 LAB — PATHOLOGIST SMEAR REVIEW

## 2020-02-13 LAB — CULTURE, BODY FLUID W GRAM STAIN -BOTTLE: Culture: NO GROWTH

## 2020-03-04 ENCOUNTER — Telehealth (HOSPITAL_COMMUNITY): Payer: Self-pay

## 2020-03-04 ENCOUNTER — Encounter (HOSPITAL_COMMUNITY): Payer: PPO | Admitting: Cardiology

## 2020-03-04 ENCOUNTER — Other Ambulatory Visit (HOSPITAL_COMMUNITY): Payer: PPO

## 2020-03-04 NOTE — Telephone Encounter (Signed)
Death certificate received and signed by Dr. Aundra Dubin Original mailed to [facility] Department

## 2020-03-09 NOTE — Progress Notes (Signed)
I checked in with several members who were bedside of pt who was transitioning.  They said they were okay for now. Please page if additional assistance/support is needed. [place], Libby   02/10/20 1600  Clinical Encounter Type  Visited With Family

## 2020-03-09 NOTE — Progress Notes (Signed)
Patient asystolic on monitor, no heart tones or breath sounds auscultated by 2 RNs, [place] Quick & Allena Katz, Therapist, sports. Family at bedside, emotional support given, chaplain offered. Dr. Carlis Abbott (CCM) & Cardiology Fellow [facility] MD notified. 40cc of Dilaudid wasted in SteriCycle witnessed by Hildred Alamin, RN & Maricela Bo, RN. [place], Ardeth Sportsman

## 2020-03-09 NOTE — Plan of Care (Signed)
I have discussed Mr. Larry [name]' care with his family (wife, daughter, sons) again this afternoon. Pleural effusion was exudative, but minimally cellular. Clotted off 2 filter today with CRRT. Elevated INR, now bleeding around vascular access sites. His family understands that he is in severe MOF and I think this is now irreversible septic shock with this degree of MOF despite several days of aggressive therapy. They agree that chest compressions would be unnecessarily painful. They have decided to withdrawn MV but want to keep pressors and CRRT. Comfort orders placed. Primary team updated.  Julian Hy, DO Feb 22, 2020 3:51 PM [facility]

## 2020-03-09 NOTE — Progress Notes (Signed)
NAME:  Larry [name], MRN:  [id number], DOB:  12-09-1940, LOS: 9 ADMISSION DATE:  01/16/2020, CONSULTATION DATE:  4/25 REFERRING MD:  Bensimhon, CHIEF COMPLAINT:  Pleural effusion   Brief History   Chronic right heart failure due to ASD S/p ASD closure, now with acute respiratory failure due to acute pulmonary edema. Requiring BiPAP.  History of present illness   Larry [name] is a 79 y/o gentleman admitted for closure of a 3cm ASD diagnosed during evaluation of right heart failure. He underwent ASD closure on 4/22.  He went into acute respiratory failure with worsening renal function on 4/24.  He had Swan-Ganz catheter, dialysis catheter placed to guide treatment.  He has had good response to diuresis.  He was able to come off BiPAP 4/23, but today required BiPAP again on 4/25. On BiPAP SOB was controlled and improved after thoracentesis on 4/25 yielding 1.7L of transudative fluid. PCCM signed off 4/26 and re-consulted 4/28 due to continued BiPAP requirements and concern for possible sepsis given CXR showing Edema vs Airspace disease with increased WBC and elevated Procalcitonin.  Past Medical History  CKD ASD CAD HLD HTN HFpEF w/ RHF  Significant Hospital Events   4/22 ASD closure 4/24 Right heart cath with Swan-Ganz placement, dialysis catheter placement 4/25 R Thoracentesis (-1/7L Transudate)  Consults:  PCCM Cardiology Nephro  Procedures:  4/22 ASD closure 4/24 Right heart cath with Swan-Ganz placement, dialysis catheter placement 4/25 R Thoracentesis (-1/7L Transudate)  Significant Diagnostic Tests:  CXR (4/25): Decreased RIGHT pleural effusion and improved RIGHT LOWER lung aeration. No evidence of pneumothorax.  CXR (4/26): Generalized pulmonary opacity with interval progression. Stable small right pleural effusion.  CXR (4/28): Stable bilateral lung opacities are noted consistent with pulmonary edema or pneumonia. Stable mild right pleural effusion is noted.  Micro  Data:  4/26 Pleural fluid: > WBC cells, no organisms, culture NG 4/28 Blood Cx: Pending 4/28 Urine Cx: NG 4/28 Sputum Cx: moderate GNRs> Citrobacter freundii (R cefazolin, otherwise pan-sensitive)   Antimicrobials:  Cefazolin 4/21 Linezolid 4/28 >> Cefepime 4/28 >>5/1 Ceftriaxone 5/1>>  Interim history/subjective:  Overnight had an episode of desaturation and severe hypotension.  Required BVM for 10 minutes and maxing all 4 pressors.  Minimally responsive this morning.  Objective   Blood pressure (!) 100/51, pulse 97, temperature 97.7 F (36.5 C), resp. rate (!) 27, height 6' (1.829 m), weight 90.5 kg, SpO2 100 %. PAP: (31-43)/(14-28) 39/21 CVP:  [7 mmHg-22 mmHg] 15 mmHg PCWP:  [25 mmHg] 25 mmHg CO:  [5.7 L/min-5.8 L/min] 5.8 L/min CI:  [2.7 L/min/m2] 2.7 L/min/m2  Vent Mode: PRVC FiO2 (%):  [40 %-100 %] 100 % Set Rate:  [23 bmp] 23 bmp Vt Set:  [580 mL] 580 mL PEEP:  [8 cmH20] 8 cmH20 Plateau Pressure:  [21 cmH20-27 cmH20] 27 cmH20   Intake/Output Summary (Last 24 hours) at 02-Mar-2020 0748 Last data filed at 2020-03-02 0700 Gross per 24 hour  Intake 6428.79 ml  Output 5236 ml  Net 1192.79 ml   Filed Weights   02/05/20 0548 02/07/20 0500 03/02/2020 0230  Weight: 84.7 kg 89.1 kg 90.5 kg    Examination:  General: Critically ill-appearing man lying in bed on no sedation, on mechanical ventilation.  Minimally responsive, wiggling toes, but not squeezing hands.  Able to slightly open his eyes. HEENT: [place]/AT, eyes anicteric, ETT and OGT in place Cardio: Irregular rhythm, regular rate Resp: Tachypneic, breathing above the vent.  No rhonchi.  Reduced right basilar breath sounds.  No secretions  from ET tube. Abdomen: Soft, minimally distended, mild tenderness to palpation unchanged Extremities: Mottling of bilateral lower extremities, warm.  Anasarca. Neuro: Only responding to family, minimally opening his eyes and lifting his eyebrows.  Very weakly wiggling his toes, but not moving  his arms Derm: Pallor, petechiae on chest and forehead, mottling on legs.   CXR 5/2 personally reviewed -persistent right-sided effusion, bilateral airspace disease worse on the right.  Trialysis, CVC, Swan, ET tube in place.  Resolved Hospital Problem list     Assessment & Plan:  Citrobacter pneumonia Acute hypoxic respiratory failure requiring mechanical ventilation Acute pulmonary edema Right-sided transudative pleural effusion -Continue low tidal line ventilation, 4 to 8 cc/kg ideal body weight with goal plateau less than 30 and driving pressure less than 15-20.  Low peak pressures in the 20s.  Unable to assess plateaus due to dyssynchrony.  Hyperventilating due to metabolic acidosis. -ABG this morning -Daily SAT and SBT when appropriate; currently remains too unstable -Fentanyl as needed for sedation.  Has not been requiring sedation due to encephalopathy -Change back to cefepime given decompensation overnight.  Continue vancomycin. -Right-sided chest tube today to evaluate and potentially treat parapneumonic effusion is an uncontrolled source of infection.  Will require platelets prior to placement. -Family is aware that this could be a prolonged intubation given his pre-existing medical conditions and frailty.  Septic shock, less consistent with cardiogenic.  SVR remains low despite 4 pressors. -Continue vasopressors as required to maintain MAP greater than 65-currently maxed on 2 pressor, epinephrine, norepinephrine, vasopressin plus midodrine.  Adding Neo-Synephrine. -Continue stress dose steroids -Continue broad-spectrum antibiotics -Chest tube to evaluate for uncontrolled source of infection. -Guarded prognosis  Afib, CAD, ASD s/p closure, A/C dHF with RHF.  Hypervolemic. -Continue CRRT; goal euvolemia to prevent further decompensation -Ongoing management per cardiology  Hypervolemic hyponatremia due to heart failure and kidney injury AGMA 2/2 Uremia; likely lactic  acidosis.  Worsening metabolic acidosis overnight despite CRRT. AKI on CKD: Now on CRRT -CRRT per nephrology -Recheck lactic acid -If acidosis is worsening, may require bicarb infusion to be restarted.  Recheck ABG this morning (previous 1 had been done after pushing amps of bicarb).  Hyperventilating to compensate for metabolic acidosis.  Likely aspiration  -Eventually will need swallow evaluation post extubation  Acute blood loss anemia- lower GI bleed while on anticoagulation.  Resolved after stopping anticoagulation. -Continue holding anticoagulation -Continue high-dose PPI -Continue supportive care and monitoring  Thrombocytopenia possibly due to acute illness, less likely due to medications-worsening. 4T HIT score= 5, but not receiving heparin peripherally or with dialysis. -2 units platelets today for chest tube placement -Previously transition from linezolid to vancomycin -Continue to monitor -Checking D-dimer, fibrinogen, INR, but with stable hemoglobin DIC is less likely.  Septic encephalopathy -Continue to hold sedation unless required -Continue supportive care -Family present providing frequent reorientation and encouragement  Daughter, sons, and wife updated at bedside.  Discussed plan with Dr. Aundra Dubin. Guarded prognosis.  Best practice:  Per primary Disposition: ICU  Labs   CBC: Recent Labs  Lab 02/03/20 0525 02/04/20 0125 02/04/20 0257 02/04/20 2305 02/05/20 0408 02/05/20 1558 02/06/20 0323 02/06/20 0329 02/06/20 1630 02/06/20 1630 02/07/20 0058 02/07/20 0415 02/07/20 0753 2020/02/25 0330 02/25/20 0635  WBC 15.0*  --  17.2*   < > 20.7*  --  21.5*  --  23.8*  --   --  23.1*  --  30.2*  --   NEUTROABS 13.1*  --  15.1*  --  18.6*  --  19.4*  --   --   --   --  21.1*  --   --   --   HGB 7.5*   < > 7.9*   < > 7.7*   < > 9.2*   < > 8.5*   < > 9.9* 8.6* 9.5* 8.7* 9.5*  HCT 23.1*   < > 23.1*   < > 23.4*   < > 27.7*   < > 27.2*   < > 29.0* 26.2* 28.0* 26.9*  28.0*  MCV 89.5  --  87.8   < > 88.6  --  87.4  --  91.0  --   --  88.2  --  90.3  --   PLT 77*  --  87*   < > 87*  --  59*  --  52*  --   --  32*  --  24*  --    < > = values in this interval not displayed.    Basic Metabolic Panel: Recent Labs  Lab 02/03/20 0525 02/04/20 0125 02/05/20 0408 02/05/20 0408 02/05/20 1558 02/05/20 1558 02/06/20 0323 02/06/20 0329 02/06/20 1323 02/06/20 1347 02/07/20 0415 02/07/20 0753 02/07/20 1543 2020-03-06 0330 2020-03-06 0635  NA 128*   < > 134*   < > 135   < > 136   < > 137   < > 137 136 138 136 134*  K 3.7   < > 4.6   < > 4.9   < > 4.4   < > 5.3*   < > 3.9 4.7 4.6 4.5 7.2*  CL 85*   < > 99   < > 100   < > 101  --  103  --  103  --  104 103  --   CO2 28   < > 21*   < > 19*   < > 23  --  16*  --  21*  --  22 17*  --   GLUCOSE 147*   < > 132*   < > 106*   < > 109*  --  68*  --  158*  --  80 100*  --   BUN 96*   < > 60*   < > 44*   < > 31*  --  37*  --  22  --  21 18  --   CREATININE 3.32*   < > 2.18*   < > 1.58*   < > 1.32*  --  1.73*  --  1.17  --  1.06 1.03  --   CALCIUM 8.4*   < > 8.4*   < > 7.6*   < > 8.1*  --  7.8*  --  7.4*  --  7.6* 7.6*  --   MG 1.9  --  2.5*  --   --   --  2.6*  --   --   --  2.4  --   --  2.3  --   PHOS  --    < > 3.9   < > 3.6  --  3.0  --   --   --  2.7  --  2.4* 3.6  --    < > = values in this interval not displayed.   GFR: Estimated Creatinine Clearance: 65.9 mL/min (by C-G formula based on SCr of 1.03 mg/dL). Recent Labs  Lab 02/04/20 0759 02/04/20 2305 02/06/20 0323 02/06/20 0958 02/06/20 1323 02/06/20 1630 02/07/20 0415 03/06/20 0330  PROCALCITON 4.28  --   --   --   --   --   --   --  WBC  --    < > 21.5*  --   --  23.8* 23.1* 30.2*  LATICACIDVEN  --   --   --  5.6* 6.4*  --   --   --    < > = values in this interval not displayed.    Liver Function Tests: Recent Labs  Lab 02/05/20 1558 02/06/20 0323 02/07/20 0415 02/07/20 1543 03-03-20 0330  ALBUMIN 2.1* 2.1* 1.6* 1.7* 1.6*   No results  for input(s): LIPASE, AMYLASE in the last 168 hours. No results for input(s): AMMONIA in the last 168 hours.  ABG    Component Value Date/Time   PHART 7.385 Mar 03, 2020 0635   PCO2ART 34.2 03-Mar-2020 0635   PO2ART 238 (H) 03/03/20 0635   HCO3 20.6 03/03/20 0635   TCO2 22 03-Mar-2020 0635   ACIDBASEDEF 4.0 (H) 2020/03/03 0635   O2SAT 100.0 03-03-20 0635     Coagulation Profile: No results for input(s): INR, PROTIME in the last 168 hours.  Cardiac Enzymes: No results for input(s): CKTOTAL, CKMB, CKMBINDEX, TROPONINI in the last 168 hours.  HbA1C: No results found for: HGBA1C  CBG: Recent Labs  Lab 02/07/20 1547 02/07/20 1639 02/07/20 2026 02/07/20 2323 03-03-2020 0324  GLUCAP 69* 122* 110* 112* 98     This patient is critically ill with multiple organ system failure which requires frequent high complexity decision making, assessment, support, evaluation, and titration of therapies. This was completed through the application of advanced monitoring technologies and extensive interpretation of multiple databases. During this encounter critical care time was devoted to patient care services described in this note for 45 minutes.  Julian Hy, DO 03-03-20 8:09 AM [facility]

## 2020-03-09 NOTE — Progress Notes (Signed)
[facility] Progress Note Patient Name: Larry [name] DOB: 1940/10/29 MRN: [id number]   Date of Service  02-17-2020  HPI/Events of Note  Pt desaturated  with asymetrical breath sounds.  eICU Interventions  Pt bagged, ET tube re-taped, stat portable CXR ordered, ABG obtained and resulted-PO2 [place] 02-17-20, 6:42 AM

## 2020-03-09 NOTE — Progress Notes (Addendum)
[facility] Progress Note  Name: Normand Damron MRN: [id number] DOB: 01-12-1941  Chief Complaint:  Presented for ASD repair  Subjective:  Seen on CRRT and procedure supervised.  He is charted as having 5.2 kg UF over 5/1 with CRRT.  Transitioned to keep even yesterday.  His nurse states that about 10 minutes ago he had sudden decrease in oxygenation - no bath or movement prior to that event.  He has now been maxed on levo, epi, vaso, and giapressa.  They have bagged him and increased to 100% FIO2 and are on the phone with [facility] and about to get a CXR.  Nursing appropriately decreased UF when acute event occurred.  They are concerned tube is out of position.  Review of systems:    Unable to obtain 2/2 intubated   Intake/Output Summary (Last 24 hours) at 2020/02/09 0622 Last data filed at 02/09/20 0600 Gross per 24 hour  Intake 6371.59 ml  Output 5141 ml  Net 1230.59 ml    Vitals:  Vitals:   02-09-20 0515 02/09/20 0530 02-09-2020 0545 02/09/2020 0600  BP:      Pulse: 90 89 88 90  Resp: (!) 28 (!) 26 (!) 30 (!) 25  Temp: (!) 97.2 F (36.2 C) (!) 97.2 F (36.2 C) (!) 97.3 F (36.3 C) (!) 97.3 F (36.3 C)  TempSrc:      SpO2: 100% 100% 97% 95%  Weight:      Height:         Physical Exam:  General adult male in bed critically ill and intubated HEENT normocephalic atraumatic  Neck supple trachea midline Lungs decreased breath sounds left?  Heart S1S2 on pressors Abdomen softly distended Extremities 2+ pitting edema  Neuro - on sedation Left IJ nontunneled catheter   Medications reviewed   Labs:  BMP Latest Ref Rng & Units 02/09/2020 02/07/2020 02/07/2020  Glucose 70 - 99 mg/dL 100(H) 80 -  BUN 8 - 23 mg/dL 18 21 -  Creatinine 0.61 - 1.24 mg/dL 1.03 1.06 -  BUN/Creat Ratio 10 - 24 - - -  Sodium 135 - 145 mmol/L 136 138 136  Potassium 3.5 - 5.1 mmol/L 4.5 4.6 4.7  Chloride 98 - 111 mmol/L 103 104 -  CO2 22 - 32 mmol/L 17(L) 22 -  Calcium 8.9 - 10.3 mg/dL 7.6(L)  7.6(L) -    Assessment/Plan:   # Acute kidney injury  - with prerenal and ischemic insults in the setting of shock with overload and hypotension.  Left IJ nontunneled catheter was placed and started CRRT 4/28.   - 4K fluids  - giving 2 amps bicarb - Increase dialysate to 2 liters/hr and inc pre-filter fluids to 513ml/hr  - UF goal is keep even as tolerated for now.  Acute decompensation and not currently tolerating pulling even that.  Please concentrate inputs as able as yesterday he still had 5.2 kg UF with just keeping even and briefly attempting to pull fluid  # Fluid volume overload - CRRT as above  - CHF following closely  # Acute hypoxic respiratory failure - on mechanical ventilation  - RT in room and they are on phone with [facility] - concern tube is out of place and they are getting CXR stat  # Septic shock - Pressors and antibiotics per primary team, critical care  # Status post ASD repair per cardiology  # Hyponatremia - Setting of overload.  Also has been on Lasix and diminishing response.  Follow with CRRT - resolved  #  Iron deficiency anemia - Transfusions per primary team - note report of dark stools previously   - s/p aranesp 40 mcg once 4/29   # CKD stage III  - baseline Cr is 1.84 per 11/2019 but rose to 2 - 2.4 in March 2021 with possible progression to stage IV at that time  Claudia Desanctis, MD 03/01/20 6:56 AM

## 2020-03-09 NOTE — Progress Notes (Signed)
Pharmacy Antibiotic Note  Larry [name] is a 79 y.o. male admitted on 01/14/2020 for ASD closure. He went into acute respiratory failure with worsening renal function on 4/24. Now with low SVR and preserved cardiac output concerning for septic vs vasodilatory shock. Pharmacy has been consulted for cefepime and linezolid dosing. CRRT initiated 4/28.  5/1 > worsening platelet count on linezolid. Sputum culture growing citrobacter that is sensitive to everything except cefazolin. > changed to vancomycin and ceftriaxone  5/2 > increasing pressor requirement this AM, rising WBC.  Pharmacy asked to switch ceftriaxone back to cefepime.  Plan: Continue vancomycin 1g q 24 hrs while patient is on CRRT. Cefepime 2g IV q 12 hrs. F/u cultures, CRRT duration, LOT, clinical status  Height: 6' (182.9 cm) Weight: 90.5 kg (199 lb 8.3 oz) IBW/kg (Calculated) : 77.6  Temp (24hrs), Avg:97.3 F (36.3 C), Min:96.4 F (35.8 C), Max:98.2 F (36.8 C)  Recent Labs  Lab 02/05/20 0408 02/05/20 1558 02/06/20 0323 02/06/20 0958 02/06/20 1323 02/06/20 1630 02/07/20 0415 02/07/20 1543 Feb 25, 2020 0330  WBC 20.7*  --  21.5*  --   --  23.8* 23.1*  --  30.2*  CREATININE 2.18*   < > 1.32*  --  1.73*  --  1.17 1.06 1.03  LATICACIDVEN  --   --   --  5.6* 6.4*  --   --   --   --    < > = values in this interval not displayed.    Estimated Creatinine Clearance: 65.9 mL/min (by C-G formula based on SCr of 1.03 mg/dL).    Allergies  Allergen Reactions  . Plavix [Clopidogrel Bisulfate] Anaphylaxis         Antimicrobials this admission: 4/22 Cefazolin x 1 Linezolid 4/28 >>5/1 Cefepime 4/28 >>5/1; resume 5/2 Vancomycin 5/1 >  Ceftriaxone 5/1 > 5/2  Dose adjustments this admission: None  Microbiology results: 4/24 MRSA: neg 4/25 Pleural fluid: ngF 4/28 sputum: citrobacter R ancef S cefepime/ceftriaxone 4/28 BCx: ngtd 4/28 urine: ngF  Thank you for allowing pharmacy to be a part of this patient's  care. Marguerite Olea, [facility] Clinical Pharmacist Phone [phone]  02-25-2020 8:21 AM   Please check [email or web address] for unit-specific pharmacist phone numbers

## 2020-03-09 NOTE — Progress Notes (Addendum)
Patient ID: Zachariah Pavek, male   DOB: 03-16-1941, 79 y.o.   MRN: [id number]    Advanced Heart Failure Rounding Note  Patient Profile  Admitted for large secundum ASD closure.   Developed acute pulmonary edema with respiratory failure and worsening renal function.  Underwent placement of a Swan-Ganz catheter and temporary dialysis catheter.  Hospital course further c/b shock, felt to be vasodilatory/septic w/ high pressor support. Also w/ recurrent GIB w/ anemia requiring transfusion.    Subjective:     Intubated 4/30, worsening shock.  CVVH with no UF.    Acute desaturation this morning, had to be bagged.  Now improved.  Suctioned but did not appear to be a mucus plug. After this, pressor requirement up. Remains on NE 98, epinephrine 28, vasopressin 0.04, Giapreza 30.  He is also on midodrine 15 tid, Florinef, and hydrocortisone 100 q8. ABG 7.39/34/238.   CVVH ongoing, had tried to keep even yesterday but now with drop in pressure and increased pressors running positive.   CXR with R>L effusions, pulmonary edema.  He is hypothermic.  Now on vancomycin/ceftriaxone.  Sputum with Citrobacter freundii. WBCs up to 30 (also on steroids).   Hgb higher after transfusion, 7.7>>9.2>>10.5>>8.6>>8.7.   Plt ct trending down, 95>>87>>59 >>32 >> 24  Swan #s CVP 16 PA 40/22 Thermo CI 2.7 SVR 690 Co-ox 75% (last night)  Wife and daughter at bedside.   Echo (4/21): EF 60-65%, RV looked relatively normal, ASD closure device intact, dilated IVC  Objective:   Weight Range:  Vital Signs:   Temp:  [96.1 F (35.6 C)-98.2 F (36.8 C)] 97.7 F (36.5 C) (05/02 0700) Pulse Rate:  [79-97] 97 (05/02 0700) Resp:  [21-30] 27 (05/02 0700) BP: (100-110)/(48-54) 100/51 (05/02 0341) SpO2:  [83 %-100 %] 100 % (05/02 0700) Arterial Line BP: (83-121)/(42-57) 88/57 (05/02 0700) FiO2 (%):  [40 %-100 %] 100 % (05/02 0656) Weight:  [90.5 kg] 90.5 kg (05/02 0230) Last BM Date: 02/04/20  Weight  change: Filed Weights   02/05/20 0548 02/07/20 0500 03-04-20 0230  Weight: 84.7 kg 89.1 kg 90.5 kg    Intake/Output:   Intake/Output Summary (Last 24 hours) at 2020-03-04 0731 Last data filed at 03/04/2020 0700 Gross per 24 hour  Intake 6428.79 ml  Output 5236 ml  Net 1192.79 ml     Physical Exam: General: On vent Neck: JVP 14 cm, no thyromegaly or thyroid nodule.  Lungs: Decreased at bases.  CV: Nondisplaced PMI.  Heart regular S1/S2, no S3/S4, no murmur.  2+ edema to thighs.   Abdomen: Soft, no hepatosplenomegaly, no distention.  Skin: Intact without lesions or rashes.  Neurologic: Sedated on vent. Extremities: No clubbing or cyanosis.  HEENT: Normal.   Telemetry: NSR mid 70s  Labs: Basic Metabolic Panel: Recent Labs  Lab 02/03/20 0525 02/04/20 0125 02/05/20 0408 02/05/20 0408 02/05/20 1558 02/05/20 1558 02/06/20 0323 02/06/20 0329 02/06/20 1323 02/06/20 1347 02/07/20 0415 02/07/20 0753 02/07/20 1543 2020-03-04 0330 Mar 04, 2020 0635  NA 128*   < > 134*   < > 135   < > 136   < > 137   < > 137 136 138 136 134*  K 3.7   < > 4.6   < > 4.9   < > 4.4   < > 5.3*   < > 3.9 4.7 4.6 4.5 7.2*  CL 85*   < > 99   < > 100   < > 101  --  103  --  103  --  104 103  --   CO2 28   < > 21*   < > 19*   < > 23  --  16*  --  21*  --  22 17*  --   GLUCOSE 147*   < > 132*   < > 106*   < > 109*  --  68*  --  158*  --  80 100*  --   BUN 96*   < > 60*   < > 44*   < > 31*  --  37*  --  22  --  21 18  --   CREATININE 3.32*   < > 2.18*   < > 1.58*   < > 1.32*  --  1.73*  --  1.17  --  1.06 1.03  --   CALCIUM 8.4*   < > 8.4*   < > 7.6*   < > 8.1*   < > 7.8*   < > 7.4*  --  7.6* 7.6*  --   MG 1.9  --  2.5*  --   --   --  2.6*  --   --   --  2.4  --   --  2.3  --   PHOS  --    < > 3.9   < > 3.6  --  3.0  --   --   --  2.7  --  2.4* 3.6  --    < > = values in this interval not displayed.    Liver Function Tests: Recent Labs  Lab 02/05/20 1558 02/06/20 0323 02/07/20 0415 02/07/20 1543  Feb 28, 2020 0330  ALBUMIN 2.1* 2.1* 1.6* 1.7* 1.6*   No results for input(s): LIPASE, AMYLASE in the last 168 hours. No results for input(s): AMMONIA in the last 168 hours.  CBC: Recent Labs  Lab 02/03/20 0525 02/04/20 0125 02/04/20 0257 02/04/20 2305 02/05/20 0408 02/05/20 1558 02/06/20 0323 02/06/20 0329 02/06/20 1630 02/06/20 1630 02/07/20 0058 02/07/20 0415 02/07/20 0753 Feb 28, 2020 0330 02/28/20 0635  WBC 15.0*  --  17.2*   < > 20.7*  --  21.5*  --  23.8*  --   --  23.1*  --  30.2*  --   NEUTROABS 13.1*  --  15.1*  --  18.6*  --  19.4*  --   --   --   --  21.1*  --   --   --   HGB 7.5*   < > 7.9*   < > 7.7*   < > 9.2*   < > 8.5*   < > 9.9* 8.6* 9.5* 8.7* 9.5*  HCT 23.1*   < > 23.1*   < > 23.4*   < > 27.7*   < > 27.2*   < > 29.0* 26.2* 28.0* 26.9* 28.0*  MCV 89.5  --  87.8   < > 88.6  --  87.4  --  91.0  --   --  88.2  --  90.3  --   PLT 77*  --  87*   < > 87*  --  59*  --  52*  --   --  32*  --  24*  --    < > = values in this interval not displayed.    Cardiac Enzymes: No results for input(s): CKTOTAL, CKMB, CKMBINDEX, TROPONINI in the last 168 hours.  BNP: BNP (last 3 results) Recent Labs    12/19/19 1404 01/15/20 1422 02/04/2020 1106  BNP  192.2* 607.6* 573.5*    ProBNP (last 3 results) No results for input(s): PROBNP in the last 8760 hours.    Other results:  Imaging: DG CHEST PORT 1 VIEW  Result Date: 02/07/2020 CLINICAL DATA:  CHF EXAM: PORTABLE CHEST 1 VIEW COMPARISON:  Chest radiograph from one day prior. FINDINGS: Right internal jugular central venous catheter terminates over the right pulmonary artery. Enteric tube enters stomach with the tip not seen on this image. Endotracheal tube tip is 6.6 cm above the carina. Pacer pad overlies the heart. Left internal jugular central venous catheter terminates in the upper third of the SVC. Stable cardiomediastinal silhouette with mild cardiomegaly. No pneumothorax. Small bilateral pleural effusions, right greater  than left, stable. Diffuse hazy opacity in both lungs with diffuse prominence of the parahilar interstitial markings, not appreciably changed. IMPRESSION: 1. Well-positioned support structures. No pneumothorax. 2. Stable small bilateral pleural effusions, right greater than left. 3. Stable cardiomegaly, diffuse hazy lung opacities and diffuse prominence of the parahilar interstitial markings compatible with severe pulmonary edema. Electronically Signed   By: Ilona Sorrel M.D.   On: 02/07/2020 09:19   DG CHEST PORT 1 VIEW  Result Date: 02/06/2020 CLINICAL DATA:  Hypoxia EXAM: PORTABLE CHEST 1 VIEW COMPARISON:  February 04, 2020 FINDINGS: Endotracheal tube tip is 5.4 cm above the carina. Nasogastric tube the tip and side port are below the diaphragm. Swan-Ganz catheter tip is in the right main pulmonary artery. Left subclavian catheter tip is in the superior vena cava. There is a mediastinal drain. There is an apparent chest tube on each side inferiorly. No pneumothorax. There are bilateral pleural effusions, considerably larger on the right than on the left. There is extensive interstitial pulmonary edema with patchy airspace opacity throughout both mid and lower lung regions as well as in the right upper lobe. Heart is upper normal in size with pulmonary vascularity within normal limits for portable technique. No adenopathy. There is aortic atherosclerosis. No bone lesions. IMPRESSION: Tube and catheter positions as described without pneumothorax. Pleural effusions bilaterally, larger on the right than left. There is interstitial and probable superimposed alveolar edema. There may be slightly less edema overall compared to 2 days prior. The overall appearance is felt to be indicative of congestive heart failure. Note that superimposed pneumonia cannot be excluded in this circumstance. Aortic Atherosclerosis (ICD10-I70.0). Electronically Signed   By: Lowella Grip III M.D.   On: 02/06/2020 09:11      Medications:     Scheduled Medications: . Benzocaine   Mouth/Throat Once  . chlorhexidine gluconate (MEDLINE KIT)  15 mL Mouth Rinse BID  . Chlorhexidine Gluconate Cloth  6 each Topical Daily  . docusate  100 mg Per Tube BID  . feeding supplement (PRO-STAT SUGAR FREE 64)  30 mL Per Tube TID  . fentaNYL (SUBLIMAZE) injection  25 mcg Intravenous Once  . fludrocortisone  0.1 mg Per Tube Daily  . hydrocortisone sod succinate (SOLU-CORTEF) inj  100 mg Intravenous Q8H  . mouth rinse  15 mL Mouth Rinse 10 times per day  . midodrine  15 mg Per Tube TID WC  . multivitamin  1 tablet Per Tube QHS  . pantoprazole (PROTONIX) IV  40 mg Intravenous Q12H  . polyethylene glycol  17 g Per Tube Daily  . pravastatin  40 mg Per Tube QPM  . sodium chloride flush  10-40 mL Intracatheter Q12H  . sodium chloride flush  3 mL Intravenous Q12H    Infusions: .  prismasol BGK 4/2.5  500 mL/hr at 02-26-2020 0657  .  prismasol BGK 4/2.5 300 mL/hr at 02/07/20 2245  . sodium chloride    . amiodarone 30 mg/hr (26-Feb-2020 0700)  . angiotensin II (GIAPREZA) infusion 40 ng/kg/min (02-26-2020 0700)  . calcium gluconate 50 mL/hr at 2020/02/26 0700  . cefTRIAXone (ROCEPHIN)  IV Stopped (02/07/20 0912)  . dextrose 20 mL/hr at 02-26-20 0700  . epinephrine 28 mcg/min (Feb 26, 2020 0700)  . feeding supplement (VITAL AF 1.2 CAL) 1,000 mL (02/07/20 1100)  . fentaNYL infusion INTRAVENOUS    . norepinephrine (LEVOPHED) Adult infusion 98 mcg/min (02-26-2020 0700)  . prismasol BGK 4/2.5 2,000 mL/hr at 26-Feb-2020 0658  . vancomycin    . vasopressin (PITRESSIN) infusion - *FOR SHOCK* 0.04 Units/min (02-26-20 0700)    PRN Medications: Place/Maintain arterial line **AND** sodium chloride, acetaminophen, bisacodyl, fentaNYL, fentaNYL (SUBLIMAZE) injection, fentaNYL (SUBLIMAZE) injection, nitroGLYCERIN, ondansetron (ZOFRAN) IV, oxyCODONE, polyethylene glycol, Resource ThickenUp Clear, sodium chloride flush   Assessment.Plan:   1. Acute  hypoxic respiratory failure with acute pulmonary edema and R pleural effusion: Initially though due to pulmonary edema s/p ASD closure. S/p right thoracentesis 4/25, transudative (CHF).  CXR 4/28 with diffuse airspace disease. WBCs up to 30=> Suspect HCAP now (sputum with Citrobacter freundii).  Poor diuresis with IV Lasix, now on CVVH.  - Was keeping even with CVVH yesterday, now net positive with hypotension earlier this am.   - Covering empirically for PNA (continue vancomycin, would switch back to broader cefepime).    2. Shock: SVR has been low, cardiac output is preserved (CI 2.7 off Swan today).  Worsened again earlier this morning with hypoxemic episode.  Currently on vasopressin 0.04, NE 98, epinephrine 28, Giapreza 30  This appears to be primarily vasodilatory shock, ?septic shock.  He is hypothermic.  WBCs up to 30.   - As above, continue abx  - Continue pressors, wean as tolerated.  - hydrocortisone 100 q8 + florinef - midodrine 15 tid.  - SVR remains low, will have phenylephrine available if needed.  3. Large secundum ASD s/p closure: s/p successful transcatheter closure of a large secundum ASD using a 26 mm Amplatzer septal occluder deviceon 01/21/2020.  Post op limited echo showed EF 60-65% with well seated ASD closure device with no residual flow. 4. Acute on chronic diastolic CHF with prominent RV failure: Still with predominant RV failure by Luiz Blare in setting of long-standing ASD, but also pulmonary edema after ASD closure.  Now intubated and CXR with diffuse airspace disease/pulmonary edema and R>L pleural effusions. CVP 16 this morning.  - Started CVVHD on 4/28. Keeping even yesterday, now positive with hypotension this morning.  If he stabilizes and pressor requirement comes down, would go back to running even to slightly negative.  4. Acute on CKD stage IV: Creatinine baseline 2.1- 2.5.  Nephrology consulted. Started on CVVHD on 4/28.  5. Paroxysmal aifb/flutter: developed recurrent AF  on 4/24.  Back in NSR now.  - Off Eliquis with GI bleeding and low plts.  - Continue amiodarone 30 mg/hr.    6. CAD: s/p remotePCI to RCA, had stent thrombosis back in 2000.No chest pain.  - Pravastatin.  - No ASA with Eliquis use.  7. History of GI bleed with chronic anemia: 4/28 FOBT +.  Transfused 1 unit PRBCs 4/29. Hgb 8.7 today  - Transfuse hgb < 8.  8. ID: Hypothermic, WBCs 30 (also on steroids).  SVR has been low suggesting vasodilatory shock, suspect septic. CXR cannot rule out PNA along with pulmonary  edema. Citrobacter in sputum.   - Cefepime/vancomycin for broad coverage.  9. Hyponatremia: Hypervolemic hyponatremia.  10. Thrombocytopenia: Suspect related to acute medical illness, plts 24k.  No heparin. - Stopped eliquis as above.   - With GI bleeding, would transfuse plts < 20K.  11. Hematuria: Had difficult foley placement.  Having some hematuria, will follow for now with Eliquis continued.  This seems to be improving. 12. FEN: Will need tube feeds when pressor requirement lower.  13. Pleural effusions: Right thoracentesis today by CCM, ?infected fluid.   Family updated.    CRITICAL CARE Performed by: [name]  Total critical care time: 45 minutes  Critical care time was exclusive of separately billable procedures and treating other patients.  Critical care was necessary to treat or prevent imminent or life-threatening deterioration.  Critical care was time spent personally by me on the following activities: development of treatment plan with patient and/or surrogate as well as nursing, discussions with consultants, evaluation of patient's response to treatment, examination of patient, obtaining history from patient or surrogate, ordering and performing treatments and interventions, ordering and review of laboratory studies, ordering and review of radiographic studies, pulse oximetry and re-evaluation of patient's condition.   [name] 2020-03-04 7:31  AM

## 2020-03-09 NOTE — Plan of Care (Signed)
After 12.72mcg fentanyl and 600cc pleural fluid removed Bps dropping. Chest tube temporarily clamped and pressors increased to raise MAP. Chest tube unclamped but fluid turned from amber to dark blood. Chest tube reclamped after 1800cc removed. 1g calcium given, 1 amp bicarb given with increased vasopressors. Transfuse 1 unit pRBCs. Awaiting coags and repeat CBC.   I had a discussion with his wife and daughter that he is now on 5 vasopressors with ongoing hypotension. His lactic acid is 7.9 and we are starting to see oozing around IV access sites concerning for DIC. I explained my concern that he may not be able to overcome this infection and his prognosis is poor. They wish to continue aggressive care, but do not wish to keep him full code given his ongoing decline despite maximal medical care. Code status change discussed with bedside RN who was in the room during the discussion. Liberalized family visitation.  This patient is critically ill with multiple organ system failure which requires frequent high complexity decision making, assessment, support, evaluation, and titration of therapies. This was completed through the application of advanced monitoring technologies and extensive interpretation of multiple databases. During this encounter critical care time was devoted to patient care services described in this note for 40 minutes.   Julian Hy, DO 02/10/20 10:26 AM [facility]

## 2020-03-09 NOTE — Procedures (Signed)
Extubation Procedure Note  Patient Details:   Name: Shyheim Tanney DOB: 1940/12/08 MRN: [id number]   Airway Documentation:    Vent end date: 03-06-2020 Vent end time: 1634   Evaluation  Pt extubated to RA per Withdrawal of Life Protocol  Jesse Sans 03/06/20, 4:35 PM

## 2020-03-09 NOTE — Procedures (Signed)
Chest Tube Insertion Procedure Note  Indications:  Clinically significant Effusion and septic shock  Pre-operative Diagnosis: Effusion and septic shock  Post-operative Diagnosis: Effusion and septic shock  Procedure Details  Informed consent was obtained for the procedure, including sedation.  Risks of lung perforation, hemorrhage, arrhythmia, and adverse drug reaction were discussed.  2 units of platelets transfused for thrombocytopenia prior to chest tube placement. [place] was used to mark locate the fluid and the skin was marked. After sterile skin prep, using standard technique, a 14 French tube was placed in the right lateral intercostal space.  Findings: 1800 ml of serosanguinous fluid obtained  Estimated Blood Loss:  less than 50 mL         Specimens:  Sent serosanguinous fluid              Complications:  Hypotension treated with clamping chest tube, increased vasopressors, biacarb, and calcium         Disposition: ICU - intubated and critically ill.         Condition: unstable  Attending Attestation: I performed the procedure.  Julian Hy, DO 02-29-2020 10:04 AM [facility]

## 2020-03-09 NOTE — Progress Notes (Signed)
Was called to patient's room.  RN stated that he had suctioned patient and he was still in the upper 70s to low 80s.  I asked RN to give O2 breath and see if patient came up and to do that a couple of times, if this did not help patient after suctioning, to call me back.  RN called as I was on my way to patient's room and told me patient was in 92s.  Got to room and tried to re-suction patient but still hovered in 78s.  Tube was checked and it was still in position buy maybe just a little more inward.  Took patient off vent, bagged till patient was 100%.  Placed patient back on vent and changed tube holder.  Made sure patient was at 25 at the lip.  Patient sat is currently at 100% on ventilator, FIO2 however is at 100% also.  Dr. Lucile Shutters ordered ABG, which was not bad, and CXR was performed at bedside, will alert oncoming shift.

## 2020-03-09 NOTE — Discharge Summary (Signed)
Advanced Heart Failure Death Summary  Death Summary   Patient ID: Larry [name] MRN: [id number], DOB/AGE: 13-Aug-1941 79 y.o. Admit date: 02/02/2020 D/C date:     Feb 29, 2020    Primary Discharge Diagnoses:  1. Acute Hypoxic Respiratory Failure 2. Shock  3. Large Secundum ASP with transcatheter closure of a large secundum ASD using a 26 mm Amplatzer septal occluder deviceon 01/28/2020 4. A/C Diastolic Heart Failure with Prominent RV Failure 5. AKI on CKD Stage IV 6. PAF 7. CAD  8. H/O GI Bleed 9. ID  10/ Hyponatremia  11. Thrombocytopenia  12. Hematuria 13. FEN  14. Pleural Effusions    Hospital Course:  Larry [name]  79 y.o.malewith a complicated medical history of CAD, PCI to RCA complicated by stent thrombosis with inferior STEMI in requiring PCI in 2000, PAF, anemia, CAD,  and chronic diastolic CHF. In 2017, he was noted to be in atrial fibrillation persistently. He decided not to undergo cardioversion. Echo in 5/19 showed EF 50-55%, severe biatrial enlargement.   In 2020  hgb was 7.6. Torsemide was also increased due to concern for volume overload. GI referral was recommended, but he did not follow up. He was started on oral iron. Hemoglobin as trended up, it was 11.3 in 06/2019 .   Admitted 01/2020 with volume overload. Hospital course complicated by anemia and A fib.Hgb dropped so GI consulted. Had scope that showed several polyps, diverticulosis and hemrrhoids. EGD showed non bleeding ulcer.Also had RHC that showed ASD. TEE showed large secundum ASD with left to right flow, 2.9 X 1.1 cm. There do appear to be adequate rims for closure. Dr Burt Knack consulted with plans to close on 01/26/2020. Also placed on midodrine 5 mg tid for ongoing hypotension. Discharge weight 186 pounds.  He returned for post hospital follow up on 01/15/20. He had marked volume overload and was referred to [facility]. He received a few doses of IV lasix at home. Weight at that time was 194  pounds. He was seen the next week and his weight had gone down 7 pounds.   Admitted 01/18/2020 for scheduled ASD closure. Had successful ASD closure on 4/22 but the next day he developed severe left sided heart failure with pulmonary edema and acute hypoxic respiratory failure.  Attempted to diurese with IV lasix but had poor response and increased work of breathing. Placed on Bipap + Milrinone and moved urgently to the ICU for swan placement. Initially had good response to high dose lasix + milrinone but renal function continued to worsen.  PCCM and Nephrology consulted. Placed on multiple pressors due to ongoing hypotension. Failed attempts to diureses and ultimately started on CVVHD. CVVHD was limited by hypotension. On 02/06/20 respiratory status worsened and he was lethargic. PCCM performed intubation.   Unfortunately he continued to decline on max pressors. PCCM and HF Team discussed multisystem organ failure with his family. On Feb 29, 2020 the family decided to withdraw mechanical ventilation. He was terminally extubated on 2020-02-29 and passed with family at his bedside.   1. Acute hypoxic respiratory failure with acute pulmonary edema and R pleural effusion: Initially though due to pulmonary edema s/p ASD closure. S/p right thoracentesis 4/25, transudative (CHF).  CXR 4/28 with diffuse airspace disease. WBCs up to 30=> Suspect HCAP now (sputum with Citrobacter freundii).  Poor diuresis with IV Lasix, and ultimately placed on CVVH.  - Required intubation on 01/3020.  - - Covered empirically for PNA (continue vancomycin, would switch back to broader cefepime).    2. Shock:  SVR had been low, cardiac output was preserved .   - On multiple pressors--> vasopressin 0.04, NE 98, epinephrine 28, Giapreza 30  This appears to be primarily vasodilatory shock, ?septic shock.  He was  hypothermic.  WBCs up to 30.  - He was placed on multiple pressors but continued to decline.  - Had been on hydrocortisone 100 q8 +  florinef + midodrine 15 tid.  3. Large secundum ASD s/p closure:s/p successful transcatheter closure of a large secundum ASD using a 26 mm Amplatzer septal occluder deviceon 02/06/2020.  Post op limited echo showed EF 60-65% with well seated ASD closure device with no residual flow. 4. Acute on chronic diastolic CHF with prominent RV failure: Still with predominant RV failure by Luiz Blare in setting of long-standing ASD, but also pulmonary edema after ASD closure.  - Required intubation due to increased work of breathing on 02/06/20..  - Started CVVHD on 4/28 but had difficulty due to hypotension. 5. AKI on CKD stage IV: Creatinine baseline 2.1- 2.5.  Due to progressive renal failure, Nephrology consulted. Started on CVVHD on 4/28. Limited by hypotension.  6. Paroxysmal aifb/flutter: developed recurrent AF on 4/24.  Placed on amiodarone on amiodarone drip.  - Off Eliquis with GI bleeding and low plts.  7 CAD:s/p remotePCI to RCA, had stent thrombosis back in 2000. 8. History of GI bleed with chronic anemia: 4/28 FOBT +.  Transfused 1 unit PRBCs 4/29.  9. ID: Hypothermic, WBCs 30 (also on steroids).  SVR has been low suggesting vasodilatory shock, suspect septic. CXR cannot rule out PNA along with pulmonary edema. Citrobacter in sputum.   - Cefepime/vancomycin for broad coverage.  10.  Hyponatremia: Sodium dropped 122.  11. Thrombocytopenia: Suspect related to acute medical illness, plts 24k.  No heparin. - Stopped eliquis 12. Hematuria: Had difficult foley placement.  Had some hematuria 13. FEN:   Duration of Discharge Encounter: Greater than 35 minutes   Signed, Darrick Grinder, NP 02/17/2020, 1:54 PM

## 2020-03-09 NOTE — Progress Notes (Signed)
Family wishes to rescind DNR status and make patient a full code. Explained to family that we are still aggressively providing care and not withdrawing any support; family wishes for patient to be a full code. Dr. Carlis Abbott notified. [place], Ardeth Sportsman

## 2020-03-09 DEATH — deceased
